# Patient Record
Sex: Female | Born: 1955
Health system: Southern US, Community
[De-identification: ages and names within clinical notes are randomized; demographics above are authoritative.]

## PROBLEM LIST (undated history)

## (undated) DIAGNOSIS — R011 Cardiac murmur, unspecified: Secondary | ICD-10-CM

## (undated) DIAGNOSIS — H35032 Hypertensive retinopathy, left eye: Secondary | ICD-10-CM

## (undated) DIAGNOSIS — E039 Hypothyroidism, unspecified: Secondary | ICD-10-CM

## (undated) DIAGNOSIS — R809 Proteinuria, unspecified: Secondary | ICD-10-CM

## (undated) DIAGNOSIS — F5104 Psychophysiologic insomnia: Secondary | ICD-10-CM

## (undated) DIAGNOSIS — M5417 Radiculopathy, lumbosacral region: Secondary | ICD-10-CM

## (undated) DIAGNOSIS — E785 Hyperlipidemia, unspecified: Secondary | ICD-10-CM

## (undated) DIAGNOSIS — C801 Malignant (primary) neoplasm, unspecified: Secondary | ICD-10-CM

## (undated) DIAGNOSIS — Z780 Asymptomatic menopausal state: Secondary | ICD-10-CM

## (undated) DIAGNOSIS — K219 Gastro-esophageal reflux disease without esophagitis: Secondary | ICD-10-CM

## (undated) DIAGNOSIS — Z923 Personal history of irradiation: Secondary | ICD-10-CM

## (undated) DIAGNOSIS — Z8489 Family history of other specified conditions: Secondary | ICD-10-CM

## (undated) DIAGNOSIS — I10 Essential (primary) hypertension: Secondary | ICD-10-CM

## (undated) DIAGNOSIS — E119 Type 2 diabetes mellitus without complications: Secondary | ICD-10-CM

## (undated) DIAGNOSIS — E049 Nontoxic goiter, unspecified: Secondary | ICD-10-CM

## (undated) DIAGNOSIS — N342 Other urethritis: Secondary | ICD-10-CM

## (undated) DIAGNOSIS — G4733 Obstructive sleep apnea (adult) (pediatric): Secondary | ICD-10-CM

## (undated) DIAGNOSIS — E559 Vitamin D deficiency, unspecified: Secondary | ICD-10-CM

## (undated) DIAGNOSIS — N3946 Mixed incontinence: Secondary | ICD-10-CM

## (undated) DIAGNOSIS — T7840XA Allergy, unspecified, initial encounter: Secondary | ICD-10-CM

## (undated) DIAGNOSIS — M19011 Primary osteoarthritis, right shoulder: Secondary | ICD-10-CM

## (undated) DIAGNOSIS — E669 Obesity, unspecified: Secondary | ICD-10-CM

## (undated) DIAGNOSIS — D649 Anemia, unspecified: Secondary | ICD-10-CM

## (undated) DIAGNOSIS — R252 Cramp and spasm: Secondary | ICD-10-CM

## (undated) HISTORY — DX: Primary osteoarthritis, right shoulder: M19.011

## (undated) HISTORY — DX: Essential (primary) hypertension: I10

## (undated) HISTORY — PX: BREAST BIOPSY: SHX20

## (undated) HISTORY — DX: Allergy, unspecified, initial encounter: T78.40XA

## (undated) HISTORY — DX: Mixed incontinence: N39.46

## (undated) HISTORY — DX: Proteinuria, unspecified: R80.9

## (undated) HISTORY — DX: Hypothyroidism, unspecified: E03.9

## (undated) HISTORY — DX: Vitamin D deficiency, unspecified: E55.9

## (undated) HISTORY — DX: Type 2 diabetes mellitus without complications: E11.9

## (undated) HISTORY — DX: Hyperlipidemia, unspecified: E78.5

## (undated) HISTORY — DX: Nontoxic goiter, unspecified: E04.9

## (undated) HISTORY — DX: Asymptomatic menopausal state: Z78.0

## (undated) HISTORY — DX: Psychophysiologic insomnia: F51.04

## (undated) HISTORY — DX: Obesity, unspecified: E66.9

## (undated) HISTORY — PX: FOOT SURGERY: SHX648

## (undated) HISTORY — DX: Other urethritis: N34.2

## (undated) HISTORY — DX: Anemia, unspecified: D64.9

## (undated) HISTORY — DX: Radiculopathy, lumbosacral region: M54.17

## (undated) HISTORY — DX: Obstructive sleep apnea (adult) (pediatric): G47.33

## (undated) HISTORY — PX: JOINT REPLACEMENT: SHX530

## (undated) HISTORY — DX: Cramp and spasm: R25.2

## (undated) HISTORY — DX: Hypertensive retinopathy, left eye: H35.032

---

## 2005-06-26 ENCOUNTER — Ambulatory Visit: Payer: Self-pay

## 2006-07-02 ENCOUNTER — Ambulatory Visit: Payer: Self-pay | Admitting: Family Medicine

## 2006-09-23 ENCOUNTER — Ambulatory Visit: Payer: Self-pay | Admitting: Internal Medicine

## 2007-07-04 ENCOUNTER — Ambulatory Visit: Payer: Self-pay | Admitting: Family Medicine

## 2008-01-19 ENCOUNTER — Ambulatory Visit: Payer: Self-pay | Admitting: Family Medicine

## 2008-07-20 ENCOUNTER — Ambulatory Visit: Payer: Self-pay | Admitting: Family Medicine

## 2009-01-10 LAB — HM COLONOSCOPY: HM COLON: NORMAL

## 2009-01-27 ENCOUNTER — Ambulatory Visit: Payer: Self-pay | Admitting: Gastroenterology

## 2009-06-24 ENCOUNTER — Ambulatory Visit: Payer: Self-pay | Admitting: Family Medicine

## 2009-07-26 ENCOUNTER — Ambulatory Visit: Payer: Self-pay | Admitting: Family Medicine

## 2010-02-06 ENCOUNTER — Ambulatory Visit: Payer: Self-pay | Admitting: Internal Medicine

## 2010-02-15 ENCOUNTER — Ambulatory Visit: Payer: Self-pay | Admitting: Otolaryngology

## 2010-03-10 ENCOUNTER — Ambulatory Visit: Payer: Self-pay | Admitting: Otolaryngology

## 2010-07-27 ENCOUNTER — Ambulatory Visit: Payer: Self-pay | Admitting: Family Medicine

## 2010-08-03 ENCOUNTER — Ambulatory Visit: Payer: Self-pay | Admitting: Family Medicine

## 2011-07-02 ENCOUNTER — Ambulatory Visit: Payer: Self-pay | Admitting: Internal Medicine

## 2011-07-12 ENCOUNTER — Other Ambulatory Visit: Payer: Self-pay | Admitting: Cardiology

## 2011-07-12 DIAGNOSIS — R011 Cardiac murmur, unspecified: Secondary | ICD-10-CM

## 2011-07-17 ENCOUNTER — Other Ambulatory Visit (INDEPENDENT_AMBULATORY_CARE_PROVIDER_SITE_OTHER): Payer: BC Managed Care – PPO | Admitting: *Deleted

## 2011-07-17 DIAGNOSIS — R011 Cardiac murmur, unspecified: Secondary | ICD-10-CM

## 2011-07-17 DIAGNOSIS — I359 Nonrheumatic aortic valve disorder, unspecified: Secondary | ICD-10-CM

## 2011-09-24 ENCOUNTER — Ambulatory Visit: Payer: Self-pay | Admitting: Family Medicine

## 2012-02-05 ENCOUNTER — Ambulatory Visit: Payer: Self-pay | Admitting: Family Medicine

## 2012-09-25 ENCOUNTER — Ambulatory Visit: Payer: Self-pay | Admitting: Family Medicine

## 2013-09-29 ENCOUNTER — Ambulatory Visit: Payer: Self-pay | Admitting: Family Medicine

## 2014-05-12 ENCOUNTER — Encounter: Payer: Self-pay | Admitting: Family Medicine

## 2014-06-04 ENCOUNTER — Ambulatory Visit: Payer: Self-pay | Admitting: Specialist

## 2014-06-12 ENCOUNTER — Encounter: Payer: Self-pay | Admitting: Family Medicine

## 2014-09-17 LAB — LIPID PANEL
CHOLESTEROL: 132 mg/dL (ref 0–200)
HDL: 46 mg/dL (ref 35–70)
LDL Cholesterol: 76 mg/dL
TRIGLYCERIDES: 51 mg/dL (ref 40–160)

## 2014-09-17 LAB — HM PAP SMEAR: HM PAP: NORMAL

## 2014-10-01 ENCOUNTER — Ambulatory Visit: Payer: Self-pay | Admitting: Family Medicine

## 2014-10-01 LAB — HM MAMMOGRAPHY: HM Mammogram: NORMAL

## 2014-12-13 DIAGNOSIS — M19019 Primary osteoarthritis, unspecified shoulder: Secondary | ICD-10-CM | POA: Insufficient documentation

## 2015-01-17 ENCOUNTER — Ambulatory Visit: Payer: Self-pay | Admitting: Surgery

## 2015-01-25 ENCOUNTER — Ambulatory Visit: Payer: Self-pay | Admitting: Surgery

## 2015-01-25 HISTORY — PX: TOTAL SHOULDER REPLACEMENT: SUR1217

## 2015-02-25 LAB — HEMOGLOBIN A1C: Hgb A1c MFr Bld: 6.2 % — AB (ref 4.0–6.0)

## 2015-03-07 LAB — SURGICAL PATHOLOGY

## 2015-03-11 DIAGNOSIS — Z96611 Presence of right artificial shoulder joint: Secondary | ICD-10-CM | POA: Insufficient documentation

## 2015-03-13 NOTE — Op Note (Signed)
PATIENT NAME:  Andrea Randolph, Andrea Randolph MR#:  [id number] DATE OF BIRTH:  [birth year]  DATE OF PROCEDURE:  01/25/2015  PREOPERATIVE DIAGNOSIS: Degenerative joint disease, right shoulder.   POSTOPERATIVE DIAGNOSIS:  Degenerative joint disease, right shoulder  PROCEDURE: Right total shoulder arthroplasty using a Biomet Comprehensive system with a press-fit #9 mini-humeral stem, a 48 x 21 mm humeral head, and a medium hybrid glenoid with a porous titanium post.   SURGEON:  Pascal Lux, M.D.   ASSISTANT:  Rachelle Hora, [facility]  ANESTHESIA: General endotracheal with an interscalene block placed preoperative by the anesthesiologist.   FINDINGS: As noted above.   COMPLICATIONS: None.   ESTIMATED BLOOD LOSS: 100 mL.  TOTAL FLUIDS:  Crystalloid 1000 mL of crystalloid.   URINE OUTPUT: Approximately 250 mL.   TOURNIQUET:   None.   DRAINS: None.   CLOSURE:  Staples.   BRIEF CLINICAL NOTE: The patient is a 59 year old female with a several year history of gradually worsening right shoulder pain. Her symptoms have progressed despite medications, activity modification, et Ronney Asters.  Her history and examination are consistent with a significant degenerative joint disease of the glenohumeral joint, confirmed by plain radiographs. She presents at this time for a right total shoulder arthroplasty.   PROCEDURE: The patient underwent placement of an interscalene block in the preoperative holding area. She was then brought into the Operating Room and lain in the supine position. After adequate general endotracheal intubation and anesthesia were obtained, the patient was repositioned in the beach chair position using the beach chair positioner. The right shoulder and upper extremity were prepped with ChloraPrep solution before being draped sterilely. Preoperative antibiotics were administered. A standard anterior approach to the shoulder was made with an approximately 4.5 to 5 inch incision. The incision was carried down  through the subcutaneous tissues to expose the deltopectoral fascia. The interval was developed and the cephalic vein identified and retracted laterally with the deltoid muscle.  The conjoined tendon was identified and retracted medially to expose the subscapularis tendon. The "three sisters" were identified and cauterized before the subscapularis tendon was taken down 1 cm medial to its attachment on the lesser tuberosity. Several tagging sutures were placed in the tendon before the joint was opened. A large loose body was encountered and removed. Capsular releases were carried out superiorly and inferiorly to better mobilize the proximal humerus. The humerus was externally rotated approximately 30 degrees and, using the external guide, the appropriate proximal humeral cut made with 30 degrees of retroversion. Several additional spurs were removed inferiorly.  Attention was directed to the glenoid side. The labrum was debrided circumferentially before a drill bit was placed in the center of the glenoid. This was used for guiding the glenoid reamer and then the central peg reamer once the size was assessed and found to be a medium. The glenoid reaming was performed using the appropriate medium-sized reamer.  Care was taken to get circumferential reaming before the peg was created using the appropriate central reamer. The 3 peg holes were then drilled into place. The surface was prepared for cementing by irrigating it thoroughly with sterile saline solution via the jet lavage system that before packing it with a Neo-Synephrine soaked piece of vag-pack. Meanwhile, cement was mixed on the back table. When the cement was ready, the three peg holes were filled with a small amount of cement each before some cement was applied to the back of the polyethylene portion of the glenoid. The glenoid was impacted into place with  care. The excess cement was removed using a freer elevator. Steady pressure was held on the glenoid  until the cement had hardened.   Attention redirected to the humeral side. The humerus was prepared by reaming the proximal humerus sequentially with tapered reamers beginning with a #5 tapered reamer and progressing to a #9 tapered reamer. This provided excellent circumferential chatter. The canal was then broached with the #7 broach progressed to a #9 broach.  This also demonstrated good fit. A trial reduction was performed using the  #9 broach with the 48 x 21 mm humeral head. This was selected based on the size of the humeral head that had been removed earlier. This demonstrated excellent stability to anterior and posterior displacement, as well as when the hand was placed to the top of the head, to the opposite shoulder, and in the apprehension position. There did not appear to be any unusual evidence of instability. The trial components were removed. The permanent #9 stem was impacted into place before the permanent 48 x 21 mm head was put together with a standard adapter and impacted into place. The Morse taper locking mechanism was verified using manual distraction. The head was relocated and the shoulder, again, placed through a range of motion with the findings as described above.   The wound was copiously irrigated with sterile saline solution via the jet lavage system before a  total of 20 mL of Exparel diluted out to 60 mL with normal saline and 30 mL of 0.25% Sensorcaine with epinephrine was injected in and around the pericapsular tissues and in the peri- incisional tissues to help with postoperative analgesia. The subscapularis tendon was reapproximated to the stump of tendon remaining on the lesser tuberosity using four #2 fiber wires. The deltopectoral interval was reapproximated using 2-0 Vicryl interrupted sutures before the subcutaneous tissues were closed in two layers using 2-0 Vicryl interrupted sutures. The skin was closed using staples. A sterile occlusive dressing was applied to the  shoulder before the arm was placed into a shoulder immobilizer. The patient then underwent placement of a Foley catheter before she was awakened, extubated, and returned to the recovery room in satisfactory condition after tolerating the procedure well.    ____________________________ J. Dorien Chihuahua, MD jjp:at D: 01/25/2015 14:39:12 ET T: 01/25/2015 17:16:41 ET JOB#: [id number]  cc: Pascal Lux, MD, <Dictator> Pascal Lux MD ELECTRONICALLY SIGNED 01/27/2015 9:18

## 2015-05-24 ENCOUNTER — Encounter: Payer: Self-pay | Admitting: Family Medicine

## 2015-05-24 DIAGNOSIS — I10 Essential (primary) hypertension: Secondary | ICD-10-CM | POA: Insufficient documentation

## 2015-05-24 DIAGNOSIS — E785 Hyperlipidemia, unspecified: Secondary | ICD-10-CM | POA: Insufficient documentation

## 2015-05-24 DIAGNOSIS — N3946 Mixed incontinence: Secondary | ICD-10-CM | POA: Insufficient documentation

## 2015-05-24 DIAGNOSIS — Z780 Asymptomatic menopausal state: Secondary | ICD-10-CM | POA: Insufficient documentation

## 2015-05-24 DIAGNOSIS — M5417 Radiculopathy, lumbosacral region: Secondary | ICD-10-CM | POA: Insufficient documentation

## 2015-05-24 DIAGNOSIS — H35039 Hypertensive retinopathy, unspecified eye: Secondary | ICD-10-CM | POA: Insufficient documentation

## 2015-05-24 DIAGNOSIS — D509 Iron deficiency anemia, unspecified: Secondary | ICD-10-CM | POA: Insufficient documentation

## 2015-05-24 DIAGNOSIS — G4700 Insomnia, unspecified: Secondary | ICD-10-CM | POA: Insufficient documentation

## 2015-05-24 DIAGNOSIS — K5909 Other constipation: Secondary | ICD-10-CM | POA: Insufficient documentation

## 2015-05-24 DIAGNOSIS — E890 Postprocedural hypothyroidism: Secondary | ICD-10-CM | POA: Insufficient documentation

## 2015-05-24 DIAGNOSIS — E559 Vitamin D deficiency, unspecified: Secondary | ICD-10-CM | POA: Insufficient documentation

## 2015-05-24 DIAGNOSIS — E049 Nontoxic goiter, unspecified: Secondary | ICD-10-CM | POA: Insufficient documentation

## 2015-05-24 DIAGNOSIS — R809 Proteinuria, unspecified: Secondary | ICD-10-CM | POA: Insufficient documentation

## 2015-05-24 DIAGNOSIS — E1129 Type 2 diabetes mellitus with other diabetic kidney complication: Secondary | ICD-10-CM | POA: Insufficient documentation

## 2015-05-24 DIAGNOSIS — J302 Other seasonal allergic rhinitis: Secondary | ICD-10-CM | POA: Insufficient documentation

## 2015-05-24 DIAGNOSIS — G4733 Obstructive sleep apnea (adult) (pediatric): Secondary | ICD-10-CM | POA: Insufficient documentation

## 2015-05-24 DIAGNOSIS — IMO0002 Reserved for concepts with insufficient information to code with codable children: Secondary | ICD-10-CM | POA: Insufficient documentation

## 2015-05-27 ENCOUNTER — Encounter: Payer: Self-pay | Admitting: Family Medicine

## 2015-05-27 ENCOUNTER — Ambulatory Visit (INDEPENDENT_AMBULATORY_CARE_PROVIDER_SITE_OTHER): Payer: BLUE CROSS/BLUE SHIELD | Admitting: Family Medicine

## 2015-05-27 VITALS — BP 132/82 | HR 80 | Resp 16 | Ht 65.0 in | Wt 230.1 lb

## 2015-05-27 DIAGNOSIS — E039 Hypothyroidism, unspecified: Secondary | ICD-10-CM | POA: Diagnosis not present

## 2015-05-27 DIAGNOSIS — H35039 Hypertensive retinopathy, unspecified eye: Secondary | ICD-10-CM | POA: Diagnosis not present

## 2015-05-27 DIAGNOSIS — E1121 Type 2 diabetes mellitus with diabetic nephropathy: Secondary | ICD-10-CM

## 2015-05-27 DIAGNOSIS — G4733 Obstructive sleep apnea (adult) (pediatric): Secondary | ICD-10-CM | POA: Diagnosis not present

## 2015-05-27 DIAGNOSIS — I10 Essential (primary) hypertension: Secondary | ICD-10-CM

## 2015-05-27 DIAGNOSIS — E785 Hyperlipidemia, unspecified: Secondary | ICD-10-CM

## 2015-05-27 DIAGNOSIS — L853 Xerosis cutis: Secondary | ICD-10-CM

## 2015-05-27 DIAGNOSIS — E668 Other obesity: Secondary | ICD-10-CM | POA: Diagnosis not present

## 2015-05-27 DIAGNOSIS — IMO0002 Reserved for concepts with insufficient information to code with codable children: Secondary | ICD-10-CM

## 2015-05-27 LAB — POCT UA - MICROALBUMIN: Microalbumin Ur, POC: 50 mg/L

## 2015-05-27 LAB — POCT GLYCOSYLATED HEMOGLOBIN (HGB A1C): Hemoglobin A1C: 5.9

## 2015-05-27 LAB — GLUCOSE, POCT (MANUAL RESULT ENTRY): POC GLUCOSE: 101 mg/dL — AB (ref 70–99)

## 2015-05-27 MED ORDER — OLMESARTAN-AMLODIPINE-HCTZ 40-10-25 MG PO TABS: 1.0000 | ORAL_TABLET | Freq: Every day | ORAL | Status: DC

## 2015-05-27 MED ORDER — PHENTERMINE-TOPIRAMATE ER 7.5-46 MG PO CP24: 1.0000 | ORAL_CAPSULE | Freq: Every day | ORAL | Status: DC

## 2015-05-27 MED ORDER — AMMONIUM LACTATE 12 % EX CREA: TOPICAL_CREAM | CUTANEOUS | Status: DC | PRN

## 2015-05-27 MED ORDER — LEVOTHYROXINE SODIUM 88 MCG PO TABS: 88.0000 ug | ORAL_TABLET | Freq: Every day | ORAL | Status: DC

## 2015-05-27 MED ORDER — DAPAGLIFLOZIN PRO-METFORMIN ER 10-1000 MG PO TB24: 1.0000 | ORAL_TABLET | Freq: Every day | ORAL | Status: DC

## 2015-05-27 MED ORDER — ROSUVASTATIN CALCIUM 10 MG PO TABS: 10.0000 mg | ORAL_TABLET | Freq: Every day | ORAL | Status: DC

## 2015-05-27 MED ORDER — PHENTERMINE-TOPIRAMATE ER 3.75-23 MG PO CP24: 1.0000 | ORAL_CAPSULE | Freq: Every day | ORAL | Status: DC

## 2015-05-27 NOTE — Patient Instructions (Signed)
Sleep Apnea  Sleep apnea is a sleep disorder characterized by abnormal pauses in breathing while you sleep. When your breathing pauses, the level of oxygen in your blood decreases. This causes you to move out of deep sleep and into light sleep. As a result, your quality of sleep is poor, and the system that carries your blood throughout your body (cardiovascular system) experiences stress. If sleep apnea remains untreated, the following conditions can develop:  High blood pressure (hypertension).  Coronary artery disease.  Inability to achieve or maintain an erection (impotence).  Impairment of your thought process (cognitive dysfunction). There are three types of sleep apnea: 1. Obstructive sleep apnea--Pauses in breathing during sleep because of a blocked airway. 2. Central sleep apnea--Pauses in breathing during sleep because the area of the brain that controls your breathing does not send the correct signals to the muscles that control breathing. 3. Mixed sleep apnea--A combination of both obstructive and central sleep apnea. RISK FACTORS The following risk factors can increase your risk of developing sleep apnea:  Being overweight.  Smoking.  Having narrow passages in your nose and throat.  Being of older age.  Being female.  Alcohol use.  Sedative and tranquilizer use.  Ethnicity. Among individuals younger than 35 years, African Americans are at increased risk of sleep apnea. SYMPTOMS   Difficulty staying asleep.  Daytime sleepiness and fatigue.  Loss of energy.  Irritability.  Loud, heavy snoring.  Morning headaches.  Trouble concentrating.  Forgetfulness.  Decreased interest in sex. DIAGNOSIS  In order to diagnose sleep apnea, your caregiver will perform a physical examination. Your caregiver may suggest that you take a home sleep test. Your caregiver may also recommend that you spend the night in a sleep lab. In the sleep lab, several monitors record  information about your heart, lungs, and brain while you sleep. Your leg and arm movements and blood oxygen level are also recorded. TREATMENT The following actions may help to resolve mild sleep apnea:  Sleeping on your side.   Using a decongestant if you have nasal congestion.   Avoiding the use of depressants, including alcohol, sedatives, and narcotics.   Losing weight and modifying your diet if you are overweight. There also are devices and treatments to help open your airway:  Oral appliances. These are custom-made mouthpieces that shift your lower jaw forward and slightly open your bite. This opens your airway.  Devices that create positive airway pressure. This positive pressure "splints" your airway open to help you breathe better during sleep. The following devices create positive airway pressure:  Continuous positive airway pressure (CPAP) device. The CPAP device creates a continuous level of air pressure with an air pump. The air is delivered to your airway through a mask while you sleep. This continuous pressure keeps your airway open.  Nasal expiratory positive airway pressure (EPAP) device. The EPAP device creates positive air pressure as you exhale. The device consists of single-use valves, which are inserted into each nostril and held in place by adhesive. The valves create very little resistance when you inhale but create much more resistance when you exhale. That increased resistance creates the positive airway pressure. This positive pressure while you exhale keeps your airway open, making it easier to breath when you inhale again.  Bilevel positive airway pressure (BPAP) device. The BPAP device is used mainly in patients with central sleep apnea. This device is similar to the CPAP device because it also uses an air pump to deliver continuous air pressure   through a mask. However, with the BPAP machine, the pressure is set at two different levels. The pressure when you  exhale is lower than the pressure when you inhale.  Surgery. Typically, surgery is only done if you cannot comply with less invasive treatments or if the less invasive treatments do not improve your condition. Surgery involves removing excess tissue in your airway to create a wider passage way. Document Released: 10/19/2002 Document Revised: 02/23/2013 Document Reviewed: 03/06/2012 [employer] Patient Information 2015 [employer]. This information is not intended to replace advice given to you by your health care provider. Make sure you discuss any questions you have with your health care provider.  

## 2015-05-27 NOTE — Progress Notes (Signed)
Name: Andrea Randolph   MRN: [id number]    DOB: 11/21/1955   Date:05/27/2015       Progress Note  Subjective  Chief Complaint  Chief Complaint  Patient presents with  . Medication Refill    3 month F/U  . Diabetes  . Hypertension  . Hyperlipidemia  . Sleep Apnea    HPI  DM II: she is taking medication as directed, denies side effects, hgbA1C is down to 5.9%, urine micro is 20, she denies polyphagia, polydipsia or polyuria.  She is not checking fsbs at home.  She is taking a daily aspirin daily   HTN: she has been compliant with medication and denies side effects of medication  Hyperlipidemia: she was taking Crestor 20mg  but stopped in March when she had shoulder replacement surgery because she was afraid of muscle aches.    OSA: she has not been compliant with CPAP machine, she used while in the hospital but stopped using when she got home after shoulder surgery.  Obesity: she states she has been walking one mile during week days and one hour on Saturdays. She states she does not have a high caloric diet, but has been unable to lose weight. She started to gain weight at age 38 and has been unable to lose since.   Patient Active Problem List   Diagnosis Date Noted  . Allergic rhinitis, seasonal 05/24/2015  . Benign essential HTN 05/24/2015  . Undiagnosed cardiac murmurs 05/24/2015  . Chronic constipation 05/24/2015  . Insomnia, persistent 05/24/2015  . Diabetes mellitus with renal manifestation 05/24/2015  . Dyslipidemia 05/24/2015  . Goiter 05/24/2015  . Acquired hypothyroidism 05/24/2015  . Mixed incontinence 05/24/2015  . Microalbuminuria 05/24/2015  . Iron deficiency anemia 05/24/2015  . Menopause 05/24/2015  . Obstructive apnea 05/24/2015  . Adult BMI 30+ 05/24/2015  . Hypertensive retinopathy 05/24/2015  . Vitamin D deficiency 05/24/2015  . H/O shoulder surgery 03/11/2015  . Arthritis of shoulder region, degenerative 12/13/2014    Past Surgical History  Procedure  Laterality Date  . Foot surgery    . Total shoulder replacement Right 01/25/2015    Dr. Roland Rack    Family History  Problem Relation Age of Onset  . Heart disease Mother   . Heart disease Father   . Kidney disease Sister   . Hypertension Sister   . Diabetes Sister   . Hypertension Brother   . CVA Brother     History   Social History  . Marital Status: Married    Spouse Name: N/A  . Number of Children: N/A  . Years of Education: N/A   Occupational History  . Not on file.   Social History Main Topics  . Smoking status: Never Smoker   . Smokeless tobacco: Never Used  . Alcohol Use: No  . Drug Use: No  . Sexual Activity: Not Currently   Other Topics Concern  . Not on file   Social History Narrative  . No narrative on file     Current outpatient prescriptions:  .  aspirin 81 MG tablet, Take by mouth., Disp: , Rfl:  .  glucose blood (FREESTYLE LITE) test strip, FREESTYLE LITE TEST (In Vitro Strip)  use as directed for 0 days  Quantity: 200.00;  Refills: 3   Ordered :25-Nov-2014  Vonna Kotyk ;  Started 30-Dec-2008 Active Comments: DX: 250.00, Disp: , Rfl:  .  ammonium lactate (LAC-HYDRIN) 12 % cream, Apply topically as needed for dry skin., Disp: 385 g, Rfl: 0 .  Dapagliflozin-Metformin HCl ER (XIGDUO XR) 08-999 MG TB24, Take 1 tablet by mouth daily., Disp: 30 tablet, Rfl: 2 .  levothyroxine (SYNTHROID, LEVOTHROID) 88 MCG tablet, Take 1 tablet (88 mcg total) by mouth daily., Disp: 30 tablet, Rfl: 0 .  Olmesartan-Amlodipine-HCTZ (TRIBENZOR) 40-10-25 MG TABS, Take 1 tablet by mouth daily., Disp: 30 tablet, Rfl: 2 .  Phentermine-Topiramate (QSYMIA) 3.75-23 MG CP24, Take 1 tablet by mouth daily., Disp: 14 capsule, Rfl: 0 .  Phentermine-Topiramate (QSYMIA) 7.5-46 MG CP24, Take 1 tablet by mouth daily., Disp: 30 capsule, Rfl: 0 .  rosuvastatin (CRESTOR) 10 MG tablet, Take 1 tablet (10 mg total) by mouth daily., Disp: 30 tablet, Rfl: 2  No Known  Allergies   ROS  Constitutional: Negative for fever or weight change.  Respiratory: Negative for cough and shortness of breath.   Cardiovascular: Negative for chest pain or palpitations.  Gastrointestinal: Negative for abdominal pain, no bowel changes.  Musculoskeletal: Negative for gait problem or joint swelling.  Skin: positive  for rash.  Neurological: Negative for dizziness or headache.  No other specific complaints in a complete review of systems (except as listed in HPI above).  Objective  Filed Vitals:   05/27/15 1004  BP: 132/82  Pulse: 80  Resp: 16  Height: 5\' 5"  (1.651 m)  Weight: 230 lb 2 oz (104.384 kg)  SpO2: 96%    Body mass index is 38.29 kg/(m^2).  Physical Exam Constitutional: Patient appears well-developed and obese. No distress.  HENT: Head: Normocephalic and atraumatic. Ears: B TMs ok, no erythema or effusion; Nose: Nose normal. Mouth/Throat: Oropharynx is clear and moist. No oropharyngeal exudate.  Eyes: Conjunctivae and EOM are normal. Pupils are equal, round, and reactive to light. No scleral icterus.  Neck: Normal range of motion. Neck supple. No JVD present. Large thyroid nodule centrally Cardiovascular: Normal rate, regular rhythm and  heart sounds showed a systolic ejection murmur on 2nd intercostal spaces. Trace  BLE edema. Pulmonary/Chest: Effort normal and breath sounds normal. No respiratory distress. Abdominal: Soft. Bowel sounds are normal, no distension. There is no tenderness. no masses Musculoskeletal: decrease  range of motion right shoulder no joint effusions. No gross deformities Neurological: he is alert and oriented to person, place, and time. No cranial nerve deficit. Coordination, balance, strength, speech and gait are normal.  Skin: Skin is warm and dry. Rash - dry pilling on right foot noted. No erythema.  Psychiatric: Patient has a normal mood and affect. behavior is normal. Judgment and thought content normal.  Recent Results  (from the past 2160 hour(s))  POCT UA - Microalbumin     Status: Abnormal   Collection Time: 05/27/15 10:03 AM  Result Value Ref Range   Microalbumin Ur, POC 50 mg/L   Creatinine, POC  mg/dL   Albumin/Creatinine Ratio, Urine, POC    POCT HgB A1C     Status: None   Collection Time: 05/27/15 10:04 AM  Result Value Ref Range   Hemoglobin A1C 5.9   POCT Glucose (CBG)     Status: Abnormal   Collection Time: 05/27/15 10:15 AM  Result Value Ref Range   POC Glucose 101 (A) 70 - 99 mg/dl    Diabetic Foot Exam: Diabetic Foot Exam - Simple   Simple Foot Form  Visual Inspection  See comments:  Yes  Sensation Testing  Intact to touch and monofilament testing bilaterally:  Yes  Pulse Check  Posterior Tibialis and Dorsalis pulse intact bilaterally:  Yes  Comments  Dry skin on right foot  PHQ2/9: Depression screen PHQ 2/9 05/27/2015  Decreased Interest 0  Down, Depressed, Hopeless 0  PHQ - 2 Score 0    Fall Risk: Fall Risk  05/27/2015  Falls in the past year? No    Assessment & Plan  1. Diabetic nephropathy associated with type 2 diabetes mellitus At goal, stop Janumet, change to Xigduo since hgbA1C is down to 5.9%  - POCT HgB A1C - POCT UA - Microalbumin - POCT Glucose (CBG) - Dapagliflozin-Metformin HCl ER (XIGDUO XR) 08-999 MG TB24; Take 1 tablet by mouth daily.  Dispense: 30 tablet; Refill: 2  2. Dyslipidemia Resume Crestor at lower dose, she does not like taking the 20 mg tablet - rosuvastatin (CRESTOR) 10 MG tablet; Take 1 tablet (10 mg total) by mouth daily.  Dispense: 30 tablet; Refill: 2  3. Acquired hypothyroidism Continue follow up with Dr. Gabriel Carina   4. Benign essential HTN At goal, continue medication - Olmesartan-Amlodipine-HCTZ (TRIBENZOR) 40-10-25 MG TABS; Take 1 tablet by mouth daily.  Dispense: 30 tablet; Refill: 2  5. Adult BMI 30+ Discussed risk and benefits of medications and she would like to try Qsymia - Phentermine-Topiramate (QSYMIA) 3.75-23  MG CP24; Take 1 tablet by mouth daily.  Dispense: 14 capsule; Refill: 0 - Phentermine-Topiramate (QSYMIA) 7.5-46 MG CP24; Take 1 tablet by mouth daily.  Dispense: 30 capsule; Refill: 0  6. Obstructive apnea Needs to resume CPAP machine, to decrease risk of heart attacks and strokes  7. Hypertensive retinopathy, unspecified laterality Continue follow up with ophthalmologist   8. Dry skin Right foot - ammonium lactate (LAC-HYDRIN) 12 % cream; Apply topically as needed for dry skin.  Dispense: 385 g; Refill: 0

## 2015-06-20 ENCOUNTER — Other Ambulatory Visit: Payer: Self-pay | Admitting: Family Medicine

## 2015-08-29 ENCOUNTER — Encounter: Payer: Self-pay | Admitting: Family Medicine

## 2015-08-29 ENCOUNTER — Ambulatory Visit (INDEPENDENT_AMBULATORY_CARE_PROVIDER_SITE_OTHER): Payer: BLUE CROSS/BLUE SHIELD | Admitting: Family Medicine

## 2015-08-29 VITALS — BP 116/66 | HR 75 | Temp 98.3°F | Resp 18 | Ht 64.0 in | Wt 224.1 lb

## 2015-08-29 DIAGNOSIS — E668 Other obesity: Secondary | ICD-10-CM

## 2015-08-29 DIAGNOSIS — I10 Essential (primary) hypertension: Secondary | ICD-10-CM | POA: Diagnosis not present

## 2015-08-29 DIAGNOSIS — E1121 Type 2 diabetes mellitus with diabetic nephropathy: Secondary | ICD-10-CM

## 2015-08-29 DIAGNOSIS — D509 Iron deficiency anemia, unspecified: Secondary | ICD-10-CM

## 2015-08-29 DIAGNOSIS — G4700 Insomnia, unspecified: Secondary | ICD-10-CM

## 2015-08-29 DIAGNOSIS — E785 Hyperlipidemia, unspecified: Secondary | ICD-10-CM | POA: Diagnosis not present

## 2015-08-29 DIAGNOSIS — R809 Proteinuria, unspecified: Secondary | ICD-10-CM | POA: Diagnosis not present

## 2015-08-29 DIAGNOSIS — E559 Vitamin D deficiency, unspecified: Secondary | ICD-10-CM | POA: Diagnosis not present

## 2015-08-29 DIAGNOSIS — Z23 Encounter for immunization: Secondary | ICD-10-CM | POA: Diagnosis not present

## 2015-08-29 DIAGNOSIS — E039 Hypothyroidism, unspecified: Secondary | ICD-10-CM | POA: Diagnosis not present

## 2015-08-29 DIAGNOSIS — IMO0002 Reserved for concepts with insufficient information to code with codable children: Secondary | ICD-10-CM

## 2015-08-29 DIAGNOSIS — G4733 Obstructive sleep apnea (adult) (pediatric): Secondary | ICD-10-CM

## 2015-08-29 LAB — POCT GLYCOSYLATED HEMOGLOBIN (HGB A1C): HEMOGLOBIN A1C: 6.1

## 2015-08-29 MED ORDER — NALTREXONE-BUPROPION HCL ER 8-90 MG PO TB12: 2.0000 | ORAL_TABLET | Freq: Two times a day (BID) | ORAL | Status: DC

## 2015-08-29 MED ORDER — OLMESARTAN-AMLODIPINE-HCTZ 40-10-25 MG PO TABS: 1.0000 | ORAL_TABLET | Freq: Every day | ORAL | Status: DC

## 2015-08-29 MED ORDER — ROSUVASTATIN CALCIUM 5 MG PO TABS: 5.0000 mg | ORAL_TABLET | Freq: Every day | ORAL | Status: DC

## 2015-08-29 MED ORDER — DAPAGLIFLOZIN PRO-METFORMIN ER 10-1000 MG PO TB24
1.0000 | ORAL_TABLET | Freq: Every day | ORAL | Status: DC
Start: 2015-08-29 — End: 2015-12-21

## 2015-08-29 NOTE — Progress Notes (Signed)
Name: Andrea Randolph   MRN: [id number]    DOB: 1956/01/12   Date:08/29/2015       Progress Note  Subjective  Chief Complaint  Chief Complaint  Patient presents with  . Medication Refill    3 month F/U  . Diabetes  . Hypertension  . Hyperlipidemia    patient states not taking medication due to having muscle pain in legs and at last visit you decreased dosage, but still having pain.  Marland Kitchen Hypothyroidism    HPI    DM II: she is taking medication as directed, denies side effects, hgbA1C is still at goal at 6.1%, urine micro was 50 on her last visit,  she denies polyphagia, polydipsia or polyuria. She is not checking fsbs at home. She is taking a daily aspirin daily, she stopped taking Crestor because of myalgia. Eye exam is due soon.   HTN: she has been compliant with medication and denies side effects of medication. No symptoms orthostatic hypotension. No chest pain or palpitation  Hyperlipidemia: she was taking Crestor 20mg  but stopped in March when she had shoulder replacement surgery because she was afraid of muscle aches.We gave her 10 mg prescription in July but she still noticed muscles aches, willing to try a lower dose  OSA: she has not been compliant with CPAP machine, she states she looks at the mask every night but she is not placing it on her face.  Obesity: she states she has been walking one mile during week days and one hour on Saturdays. She has health coach at work, changed her diet, she started Qsymia in July and lost weight, but prescription is too expensive, she has lost 6 lbs since last visit, she would like to try something cheaper.   Patient Active Problem List   Diagnosis Date Noted  . Allergic rhinitis, seasonal 05/24/2015  . Benign essential HTN 05/24/2015  . Undiagnosed cardiac murmurs 05/24/2015  . Chronic constipation 05/24/2015  . Insomnia, persistent 05/24/2015  . Diabetes mellitus with renal manifestation ([place]) 05/24/2015  . Dyslipidemia 05/24/2015  .  Goiter 05/24/2015  . Acquired hypothyroidism 05/24/2015  . Mixed incontinence 05/24/2015  . Microalbuminuria 05/24/2015  . Iron deficiency anemia 05/24/2015  . Menopause 05/24/2015  . Obstructive apnea 05/24/2015  . Adult BMI 30+ 05/24/2015  . Hypertensive retinopathy 05/24/2015  . Vitamin D deficiency 05/24/2015  . History of shoulder surgery 03/11/2015    Past Surgical History  Procedure Laterality Date  . Foot surgery    . Total shoulder replacement Right 01/25/2015    Dr. Roland Rack    Family History  Problem Relation Age of Onset  . Heart disease Mother   . Heart disease Father   . Kidney disease Sister   . Hypertension Sister   . Diabetes Sister   . Hypertension Brother   . CVA Brother     Social History   Social History  . Marital Status: Married    Spouse Name: N/A  . Number of Children: N/A  . Years of Education: N/A   Occupational History  . Not on file.   Social History Main Topics  . Smoking status: Never Smoker   . Smokeless tobacco: Never Used  . Alcohol Use: No  . Drug Use: No  . Sexual Activity: Not Currently   Other Topics Concern  . Not on file   Social History Narrative     Current outpatient prescriptions:  Marland Kitchen  Multiple Vitamin (MULTIVITAMIN) tablet, Take 1 tablet by mouth daily., Disp: ,  Rfl:  .  ammonium lactate (LAC-HYDRIN) 12 % cream, Apply topically as needed for dry skin., Disp: 385 g, Rfl: 0 .  aspirin 81 MG tablet, Take by mouth., Disp: , Rfl:  .  Dapagliflozin-Metformin HCl ER (XIGDUO XR) 08-999 MG TB24, Take 1 tablet by mouth daily., Disp: 30 tablet, Rfl: 2 .  glucose blood (FREESTYLE LITE) test strip, FREESTYLE LITE TEST (In Vitro Strip)  use as directed for 0 days  Quantity: 200.00;  Refills: 3   Ordered :25-Nov-2014  Vonna Kotyk ;  Started 30-Dec-2008 Active Comments: DX: 250.00, Disp: , Rfl:  .  levothyroxine (SYNTHROID, LEVOTHROID) 88 MCG tablet, Take 1 tablet (88 mcg total) by mouth daily., Disp: 30 tablet, Rfl: 0 .   Olmesartan-Amlodipine-HCTZ (TRIBENZOR) 40-10-25 MG TABS, Take 1 tablet by mouth daily., Disp: 30 tablet, Rfl: 2 .  rosuvastatin (CRESTOR) 5 MG tablet, Take 1 tablet (5 mg total) by mouth daily., Disp: 30 tablet, Rfl: 2  No Known Allergies   ROS  Constitutional: Negative for fever, positive for weight change.  Respiratory: Negative for cough and shortness of breath.  Cardiovascular: Negative for chest pain or palpitations.  Gastrointestinal: Negative for abdominal pain, no bowel changes.  Musculoskeletal: Negative for gait problem or joint swelling.  Skin: positive for rash. Improved rash on her foot since started on lotion Neurological: Negative for dizziness or headache.  No other specific complaints in a complete review of systems (except as listed in HPI above).  Objective  Filed Vitals:   08/29/15 1037  BP: 116/66  Pulse: 75  Temp: 98.3 F (36.8 C)  TempSrc: Oral  Resp: 18  Height: 5\' 4"  (1.626 m)  Weight: 224 lb 1.6 oz (101.651 kg)  SpO2: 98%    Body mass index is 38.45 kg/(m^2).  Physical Exam  Constitutional: Patient appears well-developed and well-nourished. ObeseNo distress.  HEENT: head atraumatic, normocephalic, pupils equal and reactive to light,neck supple, throat within normal limits. Large goiter on right side Cardiovascular: Normal rate, regular rhythm and normal heart sounds.  1 plus systolic ejection  murmur heard. No BLE edema. Pulmonary/Chest: Effort normal and breath sounds normal. No respiratory distress. Abdominal: Soft.  There is no tenderness. Psychiatric: Patient has a normal mood and affect. behavior is normal. Judgment and thought content normal. Skin: some pilling, much better on right foot  Recent Results (from the past 2160 hour(s))  POCT HgB A1C     Status: None   Collection Time: 08/29/15 10:42 AM  Result Value Ref Range   Hemoglobin A1C 6.1     PHQ2/9: Depression screen [facility] 2/9 08/29/2015 05/27/2015  Decreased Interest 0 0   Down, Depressed, Hopeless 0 0  PHQ - 2 Score 0 0    Fall Risk: Fall Risk  08/29/2015 05/27/2015  Falls in the past year? No No    Functional Status Survey: Is the patient deaf or have difficulty hearing?: No Does the patient have difficulty seeing, even when wearing glasses/contacts?: Yes (glasses) Does the patient have difficulty concentrating, remembering, or making decisions?: No Does the patient have difficulty walking or climbing stairs?: No Does the patient have difficulty dressing or bathing?: No Does the patient have difficulty doing errands alone such as visiting a doctor's office or shopping?: No    Assessment & Plan  1. Diabetic nephropathy associated with type 2 diabetes mellitus (HCC)  Continue medication and life style modification - POCT HgB A1C - Dapagliflozin-Metformin HCl ER (XIGDUO XR) 08-999 MG TB24; Take 1 tablet by mouth daily.  Dispense: 30 tablet; Refill: 2  2. Needs flu shot  - Flu Vaccine QUAD 36+ mos PF IM (Fluarix & Fluzone Quad PF)  3. Insomnia, persistent  Doing well at this time, too tired to stay awake  4. Dyslipidemia  We will decrease dose, having myalgia at higher dose - Lipid panel - rosuvastatin (CRESTOR) 5 MG tablet; Take 1 tablet (5 mg total) by mouth daily.  Dispense: 30 tablet; Refill: 2  5. Acquired hypothyroidism   Continue follow up with Dr. Gabriel Carina   6. Obstructive apnea  Needs to resume CPAP machine to decrease risk of strokes and heart attacks  7. Benign essential HTN  - Olmesartan-Amlodipine-HCTZ (TRIBENZOR) 40-10-25 MG TABS; Take 1 tablet by mouth daily.  Dispense: 30 tablet; Refill: 2 - Comprehensive metabolic panel  8. Microalbuminuria   continue ARB  9. Vitamin D deficiency  - Vit D  25 hydroxy (rtn osteoporosis monitoring)  10. Iron deficiency anemia  - Ferritin - CBC with Differential/Platelet   11. Need for pneumococcal vaccination  - Pneumococcal conjugate vaccine 13-valent IM  12. Adult  BMI 30+  Discussed all optiosns for weight loss medications including Belviq, Qsymia, Saxenda and Contrave. Discussed risk and benefits of each of them. - Naltrexone-Bupropion HCl ER (CONTRAVE) 8-90 MG TB12; Take 2 tablets by mouth 2 (two) times daily.  Dispense: 120 tablet; Refill: 1

## 2015-08-30 LAB — CBC WITH DIFFERENTIAL/PLATELET
BASOS: 1 %
Basophils Absolute: 0 10*3/uL (ref 0.0–0.2)
EOS (ABSOLUTE): 0.1 10*3/uL (ref 0.0–0.4)
EOS: 2 %
HEMATOCRIT: 37.6 % (ref 34.0–46.6)
HEMOGLOBIN: 12.1 g/dL (ref 11.1–15.9)
Immature Grans (Abs): 0 10*3/uL (ref 0.0–0.1)
Immature Granulocytes: 0 %
LYMPHS ABS: 2.3 10*3/uL (ref 0.7–3.1)
Lymphs: 50 %
MCH: 26.5 pg — ABNORMAL LOW (ref 26.6–33.0)
MCHC: 32.2 g/dL (ref 31.5–35.7)
MCV: 83 fL (ref 79–97)
MONOCYTES: 5 %
MONOS ABS: 0.2 10*3/uL (ref 0.1–0.9)
NEUTROS ABS: 1.9 10*3/uL (ref 1.4–7.0)
Neutrophils: 42 %
Platelets: 404 10*3/uL — ABNORMAL HIGH (ref 150–379)
RBC: 4.56 x10E6/uL (ref 3.77–5.28)
RDW: 15.1 % (ref 12.3–15.4)
WBC: 4.5 10*3/uL (ref 3.4–10.8)

## 2015-08-30 LAB — LIPID PANEL
CHOL/HDL RATIO: 4.5 ratio — AB (ref 0.0–4.4)
Cholesterol, Total: 251 mg/dL — ABNORMAL HIGH (ref 100–199)
HDL: 56 mg/dL (ref >39)
LDL Calculated: 178 mg/dL — ABNORMAL HIGH (ref 0–99)
Triglycerides: 84 mg/dL (ref 0–149)
VLDL Cholesterol Cal: 17 mg/dL (ref 5–40)

## 2015-08-30 LAB — COMPREHENSIVE METABOLIC PANEL
A/G RATIO: 1.7 (ref 1.1–2.5)
ALT: 17 IU/L (ref 0–32)
AST: 18 IU/L (ref 0–40)
Albumin: 4.3 g/dL (ref 3.5–5.5)
Alkaline Phosphatase: 49 IU/L (ref 39–117)
BUN/Creatinine Ratio: 16 (ref 9–23)
BUN: 15 mg/dL (ref 6–24)
Bilirubin Total: 0.3 mg/dL (ref 0.0–1.2)
CALCIUM: 9.8 mg/dL (ref 8.7–10.2)
CO2: 26 mmol/L (ref 18–29)
CREATININE: 0.96 mg/dL (ref 0.57–1.00)
Chloride: 103 mmol/L (ref 97–106)
GFR calc Af Amer: 76 mL/min/{1.73_m2} (ref >59)
GFR, EST NON AFRICAN AMERICAN: 66 mL/min/{1.73_m2} (ref >59)
GLUCOSE: 88 mg/dL (ref 65–99)
Globulin, Total: 2.6 g/dL (ref 1.5–4.5)
POTASSIUM: 3.9 mmol/L (ref 3.5–5.2)
Sodium: 140 mmol/L (ref 136–144)
Total Protein: 6.9 g/dL (ref 6.0–8.5)

## 2015-08-30 LAB — FERRITIN: Ferritin: 27 ng/mL (ref 15–150)

## 2015-08-30 LAB — VITAMIN D 25 HYDROXY (VIT D DEFICIENCY, FRACTURES): Vit D, 25-Hydroxy: 36 ng/mL (ref 30.0–100.0)

## 2015-10-12 ENCOUNTER — Encounter: Payer: Self-pay | Admitting: Family Medicine

## 2015-10-12 ENCOUNTER — Ambulatory Visit (INDEPENDENT_AMBULATORY_CARE_PROVIDER_SITE_OTHER): Payer: BLUE CROSS/BLUE SHIELD | Admitting: Family Medicine

## 2015-10-12 VITALS — BP 140/78 | HR 88 | Temp 98.7°F | Resp 16 | Ht 65.0 in | Wt 221.7 lb

## 2015-10-12 DIAGNOSIS — E785 Hyperlipidemia, unspecified: Secondary | ICD-10-CM | POA: Diagnosis not present

## 2015-10-12 DIAGNOSIS — H6122 Impacted cerumen, left ear: Secondary | ICD-10-CM | POA: Diagnosis not present

## 2015-10-12 DIAGNOSIS — J069 Acute upper respiratory infection, unspecified: Secondary | ICD-10-CM

## 2015-10-12 MED ORDER — BENZONATATE 100 MG PO CAPS: 100.0000 mg | ORAL_CAPSULE | Freq: Three times a day (TID) | ORAL | Status: DC | PRN

## 2015-10-12 MED ORDER — CIPROFLOXACIN-HYDROCORTISONE 0.2-1 % OT SUSP: 3.0000 [drp] | Freq: Two times a day (BID) | OTIC | Status: DC

## 2015-10-12 NOTE — Progress Notes (Signed)
Name: Andrea Randolph   MRN: [id number]    DOB: 1956-01-24   Date:10/12/2015       Progress Note  Subjective  Chief Complaint  Chief Complaint  Patient presents with  . URI    onset almost 2 weeks, syptoms include: congestion, loss voice,sore throat,cough and patient states had ear bleed    HPI  URI: she has been sick for the past 2 weeks, initially had some fever, nasal congestion, sore throat, headache, and wheezing. Most symptoms have improved, but now has left ear pain, and some blood that drained from the left ear 5 days ago. Also is still hoarse and has a dry cough. Other symptoms have improved.  No hearing loss, but feels muffled from left ear at times.    Patient Active Problem List   Diagnosis Date Noted  . Allergic rhinitis, seasonal 05/24/2015  . Benign essential HTN 05/24/2015  . Undiagnosed cardiac murmurs 05/24/2015  . Chronic constipation 05/24/2015  . Diabetes mellitus with renal manifestation ([place]) 05/24/2015  . Dyslipidemia 05/24/2015  . Goiter 05/24/2015  . Acquired hypothyroidism 05/24/2015  . Mixed incontinence 05/24/2015  . Microalbuminuria 05/24/2015  . Iron deficiency anemia 05/24/2015  . Menopause 05/24/2015  . Obstructive apnea 05/24/2015  . Adult BMI 30+ 05/24/2015  . Hypertensive retinopathy 05/24/2015  . Vitamin D deficiency 05/24/2015  . History of shoulder surgery 03/11/2015    Past Surgical History  Procedure Laterality Date  . Foot surgery    . Total shoulder replacement Right 01/25/2015    Dr. Roland Rack    Family History  Problem Relation Age of Onset  . Heart disease Mother   . Heart disease Father   . Kidney disease Sister   . Hypertension Sister   . Diabetes Sister   . Hypertension Brother   . CVA Brother     Social History   Social History  . Marital Status: Married    Spouse Name: N/A  . Number of Children: N/A  . Years of Education: N/A   Occupational History  . Not on file.   Social History Main Topics  . Smoking  status: Never Smoker   . Smokeless tobacco: Never Used  . Alcohol Use: No  . Drug Use: No  . Sexual Activity: Not Currently   Other Topics Concern  . Not on file   Social History Narrative     Current outpatient prescriptions:  .  ammonium lactate (LAC-HYDRIN) 12 % cream, Apply topically as needed for dry skin., Disp: 385 g, Rfl: 0 .  aspirin 81 MG tablet, Take by mouth., Disp: , Rfl:  .  benzonatate (TESSALON) 100 MG capsule, Take 1-2 capsules (100-200 mg total) by mouth 3 (three) times daily as needed for cough., Disp: 40 capsule, Rfl: 0 .  Dapagliflozin-Metformin HCl ER (XIGDUO XR) 08-999 MG TB24, Take 1 tablet by mouth daily., Disp: 30 tablet, Rfl: 2 .  glucose blood (FREESTYLE LITE) test strip, FREESTYLE LITE TEST (In Vitro Strip)  use as directed for 0 days  Quantity: 200.00;  Refills: 3   Ordered :25-Nov-2014  Vonna Kotyk ;  Started 30-Dec-2008 Active Comments: DX: 250.00, Disp: , Rfl:  .  levothyroxine (SYNTHROID, LEVOTHROID) 88 MCG tablet, Take 1 tablet (88 mcg total) by mouth daily., Disp: 30 tablet, Rfl: 0 .  Multiple Vitamin (MULTIVITAMIN) tablet, Take 1 tablet by mouth daily., Disp: , Rfl:  .  Naltrexone-Bupropion HCl ER (CONTRAVE) 8-90 MG TB12, Take 2 tablets by mouth 2 (two) times daily., Disp: 120 tablet,  Rfl: 1 .  Olmesartan-Amlodipine-HCTZ (TRIBENZOR) 40-10-25 MG TABS, Take 1 tablet by mouth daily., Disp: 30 tablet, Rfl: 2 .  rosuvastatin (CRESTOR) 5 MG tablet, Take 1 tablet (5 mg total) by mouth daily., Disp: 30 tablet, Rfl: 2  No Known Allergies   ROS  Ten systems reviewed and is negative except as mentioned in HPI   Objective  Filed Vitals:   10/12/15 0809  BP: 140/78  Pulse: 88  Temp: 98.7 F (37.1 C)  TempSrc: Oral  Resp: 16  Height: 5\' 5"  (1.651 m)  Weight: 221 lb 11.2 oz (100.562 kg)  SpO2: 98%    Body mass index is 36.89 kg/(m^2).  Physical Exam Constitutional: Patient appears well-developed and well-nourished. Obese No distress.   HEENT: head atraumatic, normocephalic, pupils equal and reactive to light, ears right TM is normal, partially visualized left TM and intact, lots of cerumen on left ear canal , neck supple, throat within normal limits. Thyromegaly  Cardiovascular: Normal rate, regular rhythm and Systolic ejection murmur on 2nd right intercostal space.  No BLE edema. Pulmonary/Chest: Effort normal and breath sounds normal. No respiratory distress. Abdominal: Soft.  There is no tenderness. Psychiatric: Patient has a normal mood and affect. behavior is normal. Judgment and thought content normal.  Recent Results (from the past 2160 hour(s))  POCT HgB A1C     Status: None   Collection Time: 08/29/15 10:42 AM  Result Value Ref Range   Hemoglobin A1C 6.1   Comprehensive metabolic panel     Status: None   Collection Time: 08/29/15 11:35 AM  Result Value Ref Range   Glucose 88 65 - 99 mg/dL   BUN 15 6 - 24 mg/dL   Creatinine, Ser 0.96 0.57 - 1.00 mg/dL   GFR calc non Af Amer 66 >59 mL/min/1.73   GFR calc Af Amer 76 >59 mL/min/1.73   BUN/Creatinine Ratio 16 9 - 23   Sodium 140 136 - 144 mmol/L    Comment:               **Please note reference interval change**   Potassium 3.9 3.5 - 5.2 mmol/L    Comment:               **Please note reference interval change**   Chloride 103 97 - 106 mmol/L    Comment:               **Please note reference interval change**   CO2 26 18 - 29 mmol/L   Calcium 9.8 8.7 - 10.2 mg/dL   Total Protein 6.9 6.0 - 8.5 g/dL   Albumin 4.3 3.5 - 5.5 g/dL   Globulin, Total 2.6 1.5 - 4.5 g/dL   Albumin/Globulin Ratio 1.7 1.1 - 2.5   Bilirubin Total 0.3 0.0 - 1.2 mg/dL   Alkaline Phosphatase 49 39 - 117 IU/L   AST 18 0 - 40 IU/L   ALT 17 0 - 32 IU/L  Lipid panel     Status: Abnormal   Collection Time: 08/29/15 11:35 AM  Result Value Ref Range   Cholesterol, Total 251 (H) 100 - 199 mg/dL   Triglycerides 84 0 - 149 mg/dL   HDL 56 >39 mg/dL    Comment: According to ATP-III Guidelines,  HDL-C >59 mg/dL is considered a negative risk factor for CHD.    VLDL Cholesterol Cal 17 5 - 40 mg/dL   LDL Calculated 178 (H) 0 - 99 mg/dL   Chol/HDL Ratio 4.5 (H) 0.0 - 4.4 ratio units  Comment:                                   T. Chol/HDL Ratio                                             Men  Women                               1/2 Avg.Risk  3.4    3.3                                   Avg.Risk  5.0    4.4                                2X Avg.Risk  9.6    7.1                                3X Avg.Risk 23.4   11.0   Ferritin     Status: None   Collection Time: 08/29/15 11:35 AM  Result Value Ref Range   Ferritin 27 15 - 150 ng/mL  CBC with Differential/Platelet     Status: Abnormal   Collection Time: 08/29/15 11:35 AM  Result Value Ref Range   WBC 4.5 3.4 - 10.8 x10E3/uL   RBC 4.56 3.77 - 5.28 x10E6/uL   Hemoglobin 12.1 11.1 - 15.9 g/dL   Hematocrit 37.6 34.0 - 46.6 %   MCV 83 79 - 97 fL   MCH 26.5 (L) 26.6 - 33.0 pg   MCHC 32.2 31.5 - 35.7 g/dL   RDW 15.1 12.3 - 15.4 %   Platelets 404 (H) 150 - 379 x10E3/uL   Neutrophils 42 %   Lymphs 50 %   Monocytes 5 %   Eos 2 %   Basos 1 %   Neutrophils Absolute 1.9 1.4 - 7.0 x10E3/uL   Lymphocytes Absolute 2.3 0.7 - 3.1 x10E3/uL   Monocytes Absolute 0.2 0.1 - 0.9 x10E3/uL   EOS (ABSOLUTE) 0.1 0.0 - 0.4 x10E3/uL   Basophils Absolute 0.0 0.0 - 0.2 x10E3/uL   Immature Granulocytes 0 %   Immature Grans (Abs) 0.0 0.0 - 0.1 x10E3/uL  Vit D  25 hydroxy (rtn osteoporosis monitoring)     Status: None   Collection Time: 08/29/15 11:35 AM  Result Value Ref Range   Vit D, 25-Hydroxy 36.0 30.0 - 100.0 ng/mL    Comment: Vitamin D deficiency has been defined by the Institute of Medicine and an Endocrine Society practice guideline as a level of serum 25-OH vitamin D less than 20 ng/mL (1,2). The Endocrine Society went on to further define vitamin D insufficiency as a level between 21 and 29 ng/mL (2). 1. IOM (Institute of Medicine).  2010. Dietary reference    intakes for calcium and D. [place]: The    [employer]. 2. Holick MF, Binkley Berkley, Bischoff-Ferrari HA, et al.    Evaluation, treatment, and prevention of vitamin D    deficiency: an Endocrine Society clinical practice    guideline.  JCEM. 2011 Jul; 96(7):1911-30.      PHQ2/9: Depression screen [facility] 2/9 08/29/2015 05/27/2015  Decreased Interest 0 0  Down, Depressed, Hopeless 0 0  PHQ - 2 Score 0 0     Fall Risk: Fall Risk  08/29/2015 05/27/2015  Falls in the past year? No No    Assessment & Plan  1. Upper respiratory infection  Continue fluids and rest, - benzonatate (TESSALON) 100 MG capsule; Take 1-2 capsules (100-200 mg total) by mouth 3 (three) times daily as needed for cough.  Dispense: 40 capsule; Refill: 0   2. Cerumen impaction, left  - Ear Lavage Verbal consent given Possible side effects discussed with patient Ears were  lavaged with warm water and peroxide  Patient tolerated procedure well, she felt a little dizzy but recovered before she left the office No complications  - ciprofloxacin-hydrocortisone (CIPRO HC) otic suspension; Place 3 drops into the left ear 2 (two) times daily.  Dispense: 10 mL; Refill: 0 We gave prescription for Cipro HC because TM was very irritated, if symptoms do not improve we will refer to ENT  3. Dyslipidemia  She was not taking Crestor when labs were done, but has been taking it every other day since she got the lab results and is tolerating it well now

## 2015-11-16 ENCOUNTER — Ambulatory Visit (INDEPENDENT_AMBULATORY_CARE_PROVIDER_SITE_OTHER): Payer: BLUE CROSS/BLUE SHIELD | Admitting: Family Medicine

## 2015-11-16 ENCOUNTER — Encounter: Payer: Self-pay | Admitting: Family Medicine

## 2015-11-16 VITALS — BP 134/74 | HR 94 | Temp 98.2°F | Resp 16 | Ht 65.0 in | Wt 222.2 lb

## 2015-11-16 DIAGNOSIS — N950 Postmenopausal bleeding: Secondary | ICD-10-CM | POA: Diagnosis not present

## 2015-11-16 DIAGNOSIS — Z0000 Encounter for general adult medical examination without abnormal findings: Secondary | ICD-10-CM

## 2015-11-16 DIAGNOSIS — Z01419 Encounter for gynecological examination (general) (routine) without abnormal findings: Secondary | ICD-10-CM

## 2015-11-16 DIAGNOSIS — Z1239 Encounter for other screening for malignant neoplasm of breast: Secondary | ICD-10-CM | POA: Diagnosis not present

## 2015-11-16 NOTE — Progress Notes (Signed)
Name: Andrea Randolph   MRN: [id number]    DOB: [birth year]   Date:11/16/2015       Progress Note  Subjective  Chief Complaint  Chief Complaint  Patient presents with  . Annual Exam    HPI  Well Woman: she has been with the same sexual partner for the past three years. She denies dyspareunia, no vaginal discharge, but she had post-menopausal bleeding -spotting for a few days about 2 months ago. She is due for a mammogram. Last pap smear in 2015 was normal   Patient Active Problem List   Diagnosis Date Noted  . Allergic rhinitis, seasonal 05/24/2015  . Benign essential HTN 05/24/2015  . Undiagnosed cardiac murmurs 05/24/2015  . Chronic constipation 05/24/2015  . Diabetes mellitus with renal manifestation ([place]) 05/24/2015  . Dyslipidemia 05/24/2015  . Goiter 05/24/2015  . Acquired hypothyroidism 05/24/2015  . Mixed incontinence 05/24/2015  . Microalbuminuria 05/24/2015  . Iron deficiency anemia 05/24/2015  . Menopause 05/24/2015  . Obstructive apnea 05/24/2015  . Adult BMI 30+ 05/24/2015  . Hypertensive retinopathy 05/24/2015  . Vitamin D deficiency 05/24/2015  . History of shoulder surgery 03/11/2015    Past Surgical History  Procedure Laterality Date  . Foot surgery    . Total shoulder replacement Right 01/25/2015    Dr. Roland Rack    Family History  Problem Relation Age of Onset  . Heart disease Mother   . Heart disease Father   . Kidney disease Sister   . Hypertension Sister   . Diabetes Sister   . Hypertension Brother   . CVA Brother     Social History   Social History  . Marital Status: Married    Spouse Name: N/A  . Number of Children: N/A  . Years of Education: N/A   Occupational History  . Not on file.   Social History Main Topics  . Smoking status: Never Smoker   . Smokeless tobacco: Never Used  . Alcohol Use: No  . Drug Use: No  . Sexual Activity: Not Currently   Other Topics Concern  . Not on file   Social History Narrative     Current  outpatient prescriptions:  .  ammonium lactate (LAC-HYDRIN) 12 % cream, Apply topically as needed for dry skin., Disp: 385 g, Rfl: 0 .  aspirin 81 MG tablet, Take by mouth., Disp: , Rfl:  .  Dapagliflozin-Metformin HCl ER (XIGDUO XR) 08-999 MG TB24, Take 1 tablet by mouth daily., Disp: 30 tablet, Rfl: 2 .  glucose blood (FREESTYLE LITE) test strip, FREESTYLE LITE TEST (In Vitro Strip)  use as directed for 0 days  Quantity: 200.00;  Refills: 3   Ordered :25-Nov-2014  Vonna Kotyk ;  Started 30-Dec-2008 Active Comments: DX: 250.00, Disp: , Rfl:  .  levothyroxine (SYNTHROID, LEVOTHROID) 88 MCG tablet, Take 1 tablet (88 mcg total) by mouth daily., Disp: 30 tablet, Rfl: 0 .  Multiple Vitamin (MULTIVITAMIN) tablet, Take 1 tablet by mouth daily., Disp: , Rfl:  .  Naltrexone-Bupropion HCl ER (CONTRAVE) 8-90 MG TB12, Take 2 tablets by mouth 2 (two) times daily., Disp: 120 tablet, Rfl: 1 .  Olmesartan-Amlodipine-HCTZ (TRIBENZOR) 40-10-25 MG TABS, Take 1 tablet by mouth daily., Disp: 30 tablet, Rfl: 2 .  rosuvastatin (CRESTOR) 5 MG tablet, Take 1 tablet (5 mg total) by mouth daily., Disp: 30 tablet, Rfl: 2  No Known Allergies   ROS  Constitutional: Negative for fever or weight change ( not taking Contrave as prescribed, and has not lost any  weight ).  Respiratory: Negative for cough and shortness of breath.   Cardiovascular: Negative for chest pain or palpitations.  Gastrointestinal: Negative for abdominal pain, no bowel changes.  Musculoskeletal: Negative for gait problem or joint swelling.  Skin: Negative for rash.  Neurological: Negative for dizziness or headache.  No other specific complaints in a complete review of systems (except as listed in HPI above).  Objective  Filed Vitals:   11/16/15 1147  BP: 134/74  Pulse: 94  Temp: 98.2 F (36.8 C)  TempSrc: Oral  Resp: 16  Height: 5\' 5"  (1.651 m)  Weight: 222 lb 3.2 oz (100.789 kg)  SpO2: 98%    Body mass index is 36.98  kg/(m^2).  Physical Exam  Constitutional: Patient appears well-developed and well-nourished. No distress.  HENT: Head: Normocephalic and atraumatic. Ears: B TMs ok, no erythema or effusion; Nose: Nose normal. Mouth/Throat: Oropharynx is clear and moist. No oropharyngeal exudate.  Eyes: Conjunctivae and EOM are normal. Pupils are equal, round, and reactive to light. No scleral icterus.  Neck: Normal range of motion. Neck supple. No JVD present. No thyromegaly present.  Cardiovascular: Normal rate, regular rhythm. Systolic Ejection murmur .Trace  BLE edema. Pulmonary/Chest: Effort normal and breath sounds normal. No respiratory distress. Abdominal: Soft. Bowel sounds are normal, no distension. There is no tenderness. no masses Breast: no lumps or masses, no nipple discharge or rashes FEMALE GENITALIA:  Not done, referring to gyn for post-menopausal bleeding Musculoskeletal: Normal range of motion, no joint effusions. No gross deformities Neurological: he is alert and oriented to person, place, and time. No cranial nerve deficit. Coordination, balance, strength, speech and gait are normal.  Skin: Skin is warm and dry. No rash noted. No erythema.  Psychiatric: Patient has a normal mood and affect. behavior is normal. Judgment and thought content normal.  Recent Results (from the past 2160 hour(s))  POCT HgB A1C     Status: None   Collection Time: 08/29/15 10:42 AM  Result Value Ref Range   Hemoglobin A1C 6.1   Comprehensive metabolic panel     Status: None   Collection Time: 08/29/15 11:35 AM  Result Value Ref Range   Glucose 88 65 - 99 mg/dL   BUN 15 6 - 24 mg/dL   Creatinine, Ser 0.96 0.57 - 1.00 mg/dL   GFR calc non Af Amer 66 >59 mL/min/1.73   GFR calc Af Amer 76 >59 mL/min/1.73   BUN/Creatinine Ratio 16 9 - 23   Sodium 140 136 - 144 mmol/L    Comment:               **Please note reference interval change**   Potassium 3.9 3.5 - 5.2 mmol/L    Comment:               **Please note  reference interval change**   Chloride 103 97 - 106 mmol/L    Comment:               **Please note reference interval change**   CO2 26 18 - 29 mmol/L   Calcium 9.8 8.7 - 10.2 mg/dL   Total Protein 6.9 6.0 - 8.5 g/dL   Albumin 4.3 3.5 - 5.5 g/dL   Globulin, Total 2.6 1.5 - 4.5 g/dL   Albumin/Globulin Ratio 1.7 1.1 - 2.5   Bilirubin Total 0.3 0.0 - 1.2 mg/dL   Alkaline Phosphatase 49 39 - 117 IU/L   AST 18 0 - 40 IU/L   ALT 17 0 - 32 IU/L  Lipid panel     Status: Abnormal   Collection Time: 08/29/15 11:35 AM  Result Value Ref Range   Cholesterol, Total 251 (H) 100 - 199 mg/dL   Triglycerides 84 0 - 149 mg/dL   HDL 56 >39 mg/dL    Comment: According to ATP-III Guidelines, HDL-C >59 mg/dL is considered a negative risk factor for CHD.    VLDL Cholesterol Cal 17 5 - 40 mg/dL   LDL Calculated 178 (H) 0 - 99 mg/dL   Chol/HDL Ratio 4.5 (H) 0.0 - 4.4 ratio units    Comment:                                   T. Chol/HDL Ratio                                             Men  Women                               1/2 Avg.Risk  3.4    3.3                                   Avg.Risk  5.0    4.4                                2X Avg.Risk  9.6    7.1                                3X Avg.Risk 23.4   11.0   Ferritin     Status: None   Collection Time: 08/29/15 11:35 AM  Result Value Ref Range   Ferritin 27 15 - 150 ng/mL  CBC with Differential/Platelet     Status: Abnormal   Collection Time: 08/29/15 11:35 AM  Result Value Ref Range   WBC 4.5 3.4 - 10.8 x10E3/uL   RBC 4.56 3.77 - 5.28 x10E6/uL   Hemoglobin 12.1 11.1 - 15.9 g/dL   Hematocrit 37.6 34.0 - 46.6 %   MCV 83 79 - 97 fL   MCH 26.5 (L) 26.6 - 33.0 pg   MCHC 32.2 31.5 - 35.7 g/dL   RDW 15.1 12.3 - 15.4 %   Platelets 404 (H) 150 - 379 x10E3/uL   Neutrophils 42 %   Lymphs 50 %   Monocytes 5 %   Eos 2 %   Basos 1 %   Neutrophils Absolute 1.9 1.4 - 7.0 x10E3/uL   Lymphocytes Absolute 2.3 0.7 - 3.1 x10E3/uL   Monocytes Absolute  0.2 0.1 - 0.9 x10E3/uL   EOS (ABSOLUTE) 0.1 0.0 - 0.4 x10E3/uL   Basophils Absolute 0.0 0.0 - 0.2 x10E3/uL   Immature Granulocytes 0 %   Immature Grans (Abs) 0.0 0.0 - 0.1 x10E3/uL  Vit D  25 hydroxy (rtn osteoporosis monitoring)     Status: None   Collection Time: 08/29/15 11:35 AM  Result Value Ref Range   Vit D, 25-Hydroxy 36.0 30.0 - 100.0 ng/mL    Comment: Vitamin D deficiency has been defined by the Institute  of Medicine and an Endocrine Society practice guideline as a level of serum 25-OH vitamin D less than 20 ng/mL (1,2). The Endocrine Society went on to further define vitamin D insufficiency as a level between 21 and 29 ng/mL (2). 1. IOM (Institute of Medicine). 2010. Dietary reference    intakes for calcium and D. [place]: The    [employer]. 2. Holick MF, Binkley Ruth, Bischoff-Ferrari HA, et al.    Evaluation, treatment, and prevention of vitamin D    deficiency: an Endocrine Society clinical practice    guideline. JCEM. 2011 Jul; 96(7):1911-30.      PHQ2/9: Depression screen [facility] 2/9 11/16/2015 08/29/2015 05/27/2015  Decreased Interest 0 0 0  Down, Depressed, Hopeless 0 0 0  PHQ - 2 Score 0 0 0    Fall Risk: Fall Risk  11/16/2015 08/29/2015 05/27/2015  Falls in the past year? No No No    Functional Status Survey: Is the patient deaf or have difficulty hearing?: No Does the patient have difficulty seeing, even when wearing glasses/contacts?: Yes Does the patient have difficulty concentrating, remembering, or making decisions?: No Does the patient have difficulty walking or climbing stairs?: No Does the patient have difficulty dressing or bathing?: No Does the patient have difficulty doing errands alone such as visiting a doctor's office or shopping?: No    Assessment & Plan  1. Well woman exam  Discussed importance of 150 minutes of physical activity weekly, eat two servings of fish weekly, eat one serving of tree nuts ( cashews, pistachios,  pecans, almonds.Marland Kitchen) every other day, eat 6 servings of fruit/vegetables daily and drink plenty of water and avoid sweet beverages.   2. Breast cancer screening  - MM Digital Screening; Future  3. Post-menopausal bleeding  - Ambulatory referral to Obstetrics / Gynecology

## 2015-11-16 NOTE — Patient Instructions (Signed)
Discussed importance of 150 minutes of physical activity weekly, eat two servings of fish weekly, eat one serving of tree nuts ( cashews, pistachios, pecans, almonds..) every other day, eat 6 servings of fruit/vegetables daily and drink plenty of water and avoid sweet beverages. 

## 2015-12-07 ENCOUNTER — Ambulatory Visit
Admission: RE | Admit: 2015-12-07 | Discharge: 2015-12-07 | Disposition: A | Discharge status: Home / Self Care | Payer: BLUE CROSS/BLUE SHIELD | Source: Ambulatory Visit | Attending: Family Medicine | Admitting: Family Medicine

## 2015-12-07 DIAGNOSIS — Z1231 Encounter for screening mammogram for malignant neoplasm of breast: Secondary | ICD-10-CM | POA: Diagnosis not present

## 2015-12-07 DIAGNOSIS — Z1239 Encounter for other screening for malignant neoplasm of breast: Secondary | ICD-10-CM

## 2015-12-21 ENCOUNTER — Encounter: Payer: Self-pay | Admitting: Family Medicine

## 2015-12-21 ENCOUNTER — Ambulatory Visit (INDEPENDENT_AMBULATORY_CARE_PROVIDER_SITE_OTHER): Payer: BLUE CROSS/BLUE SHIELD | Admitting: Family Medicine

## 2015-12-21 VITALS — BP 132/76 | HR 84 | Temp 98.6°F | Resp 16 | Ht 65.0 in | Wt 219.6 lb

## 2015-12-21 DIAGNOSIS — G4733 Obstructive sleep apnea (adult) (pediatric): Secondary | ICD-10-CM

## 2015-12-21 DIAGNOSIS — E039 Hypothyroidism, unspecified: Secondary | ICD-10-CM | POA: Diagnosis not present

## 2015-12-21 DIAGNOSIS — N950 Postmenopausal bleeding: Secondary | ICD-10-CM

## 2015-12-21 DIAGNOSIS — E668 Other obesity: Secondary | ICD-10-CM | POA: Diagnosis not present

## 2015-12-21 DIAGNOSIS — E785 Hyperlipidemia, unspecified: Secondary | ICD-10-CM

## 2015-12-21 DIAGNOSIS — IMO0002 Reserved for concepts with insufficient information to code with codable children: Secondary | ICD-10-CM

## 2015-12-21 DIAGNOSIS — G4700 Insomnia, unspecified: Secondary | ICD-10-CM | POA: Diagnosis not present

## 2015-12-21 DIAGNOSIS — I10 Essential (primary) hypertension: Secondary | ICD-10-CM

## 2015-12-21 DIAGNOSIS — E0821 Diabetes mellitus due to underlying condition with diabetic nephropathy: Secondary | ICD-10-CM

## 2015-12-21 LAB — POCT UA - MICROALBUMIN: Microalbumin Ur, POC: 20 mg/L

## 2015-12-21 LAB — POCT GLYCOSYLATED HEMOGLOBIN (HGB A1C): HEMOGLOBIN A1C: 5.9

## 2015-12-21 MED ORDER — OLMESARTAN-AMLODIPINE-HCTZ 40-10-25 MG PO TABS: 1.0000 | ORAL_TABLET | Freq: Every day | ORAL | Status: DC

## 2015-12-21 MED ORDER — CO Q 10 100 MG PO CAPS: 1.0000 | ORAL_CAPSULE | Freq: Every day | ORAL | Status: DC

## 2015-12-21 MED ORDER — NALTREXONE-BUPROPION HCL ER 8-90 MG PO TB12: 2.0000 | ORAL_TABLET | Freq: Two times a day (BID) | ORAL | Status: DC

## 2015-12-21 MED ORDER — DAPAGLIFLOZIN PRO-METFORMIN ER 10-1000 MG PO TB24: 1.0000 | ORAL_TABLET | Freq: Every day | ORAL | Status: DC

## 2015-12-21 MED ORDER — ROSUVASTATIN CALCIUM 5 MG PO TABS: 5.0000 mg | ORAL_TABLET | Freq: Every day | ORAL | Status: DC

## 2015-12-21 NOTE — Progress Notes (Signed)
Name: ZALEY MATSUSHITA   MRN: [id number]    DOB: [birth year]   Date:12/21/2015       Progress Note  Subjective  Chief Complaint  Chief Complaint  Patient presents with  . Medication Refill    4 month F/U, patient states she does not need any refill at this time.  . Hypothyroidism    One of her foot stays dry and takes medication for constipation.  . Diabetes    Does not check her sugar at home.  Marland Kitchen Hypertension    No symptoms   . Hyperlipidemia    Leg cramps  . Obesity    Patient has lost 3 pounds since last visit and is doing 15 minutes walks at lunch    HPI  DM II: she is taking medication as directed, denies side effects, hgbA1C is down to 5.9% , urine micro was 50 in 2016 but is now back to normal at 20. She denies polyphagia, polydipsia, but has polyuria - intermittently.  She is not checking fsbs at home. She is taking a daily aspirin daily, she is back on Crestor, but only every other day. Eye exam is up to date. She is on ARB. She denies hypoglycemic episodes  HTN: she has been compliant with medication and denies side effects of medication. No symptoms orthostatic hypotension. No chest pain or palpitation  Hyperlipidemia: she was taking Crestor 5 mg ( down from 20 because of myalgia ), she is only taking every other day because she still has leg pain.  I explained it may not be the cause of myalgia. We will try adding Co Q 10 and recheck lipid panel  OSA: she has not been compliant with CPAP machine, she states she looks at the mask every night but she is not placing it on her face. Explained the importance of compliance.  Obesity: she states she has been walking one mile during week days but no longer walking on Saturdays. She has health coach at work, she states she has been eating more fruit, and avoiding carbohydrates. She started Qsymia in July and lost weight, but prescription is too expensive. She was switched to Contrave 08/2015, her weight was 224 lbs. She has lost only 5  lbs since, but today she told me she skips doses and usually takes with breakfast. Advised to change and take it with her morning medications, before breakfast.   Patient Active Problem List   Diagnosis Date Noted  . Allergic rhinitis, seasonal 05/24/2015  . Benign essential HTN 05/24/2015  . Undiagnosed cardiac murmurs 05/24/2015  . Chronic constipation 05/24/2015  . Diabetes mellitus with renal manifestation ([place]) 05/24/2015  . Dyslipidemia 05/24/2015  . Goiter 05/24/2015  . Acquired hypothyroidism 05/24/2015  . Mixed incontinence 05/24/2015  . Microalbuminuria 05/24/2015  . Iron deficiency anemia 05/24/2015  . Menopause 05/24/2015  . Obstructive apnea 05/24/2015  . Adult BMI 30+ 05/24/2015  . Hypertensive retinopathy 05/24/2015  . Vitamin D deficiency 05/24/2015  . History of shoulder surgery 03/11/2015    Past Surgical History  Procedure Laterality Date  . Foot surgery    . Total shoulder replacement Right 01/25/2015    Dr. Roland Rack  . Breast biopsy Right 90's    benign core needle     Family History  Problem Relation Age of Onset  . Heart disease Mother   . Heart disease Father   . Kidney disease Sister   . Hypertension Sister   . Diabetes Sister   . Hypertension Brother   .  CVA Brother   . Breast cancer Maternal Aunt 50    Social History   Social History  . Marital Status: Married    Spouse Name: N/A  . Number of Children: N/A  . Years of Education: N/A   Occupational History  . Not on file.   Social History Main Topics  . Smoking status: Never Smoker   . Smokeless tobacco: Never Used  . Alcohol Use: No  . Drug Use: No  . Sexual Activity: Not Currently   Other Topics Concern  . Not on file   Social History Narrative     Current outpatient prescriptions:  .  ammonium lactate (LAC-HYDRIN) 12 % cream, Apply topically as needed for dry skin., Disp: 385 g, Rfl: 0 .  aspirin 81 MG tablet, Take by mouth., Disp: , Rfl:  .  Dapagliflozin-Metformin HCl  ER (XIGDUO XR) 08-999 MG TB24, Take 1 tablet by mouth daily., Disp: 30 tablet, Rfl: 2 .  glucose blood (FREESTYLE LITE) test strip, FREESTYLE LITE TEST (In Vitro Strip)  use as directed for 0 days  Quantity: 200.00;  Refills: 3   Ordered :25-Nov-2014  Vonna Kotyk ;  Started 30-Dec-2008 Active Comments: DX: 250.00, Disp: , Rfl:  .  levothyroxine (SYNTHROID, LEVOTHROID) 88 MCG tablet, Take 1 tablet (88 mcg total) by mouth daily., Disp: 30 tablet, Rfl: 0 .  Multiple Vitamin (MULTIVITAMIN) tablet, Take 1 tablet by mouth daily., Disp: , Rfl:  .  Naltrexone-Bupropion HCl ER (CONTRAVE) 8-90 MG TB12, Take 2 tablets by mouth 2 (two) times daily., Disp: 120 tablet, Rfl: 1 .  Olmesartan-Amlodipine-HCTZ (TRIBENZOR) 40-10-25 MG TABS, Take 1 tablet by mouth daily., Disp: 30 tablet, Rfl: 2 .  rosuvastatin (CRESTOR) 5 MG tablet, Take 1 tablet (5 mg total) by mouth daily., Disp: 30 tablet, Rfl: 2 .  Coenzyme Q10 (CO Q 10) 100 MG CAPS, Take 1 tablet by mouth daily., Disp: 30 capsule, Rfl: 0  No Known Allergies   ROS  Constitutional: Negative for fever and mild  weight change.  Respiratory: Negative for cough and shortness of breath.   Cardiovascular: Negative for chest pain or palpitations.  Gastrointestinal: Negative for abdominal pain, no bowel changes.  Musculoskeletal: Negative for gait problem or joint swelling.  Skin: Negative for rash.  Neurological: Negative for dizziness or headache.  No other specific complaints in a complete review of systems (except as listed in HPI above).  Objective  Filed Vitals:   12/21/15 0814  BP: 132/76  Pulse: 84  Temp: 98.6 F (37 C)  TempSrc: Oral  Resp: 16  Height: 5\' 5"  (1.651 m)  Weight: 219 lb 9.6 oz (99.61 kg)  SpO2: 97%    Body mass index is 36.54 kg/(m^2).  Physical Exam  Constitutional: Patient appears well-developed and well-nourished. Obese  No distress.  HEENT: head atraumatic, normocephalic, pupils equal and reactive to light, ear  normal TM, neck supple, goiter, throat within normal limits Cardiovascular: Normal rate, regular rhythm, systolic ejection murmur 2/6 No BLE edema. Pulmonary/Chest: Effort normal and breath sounds normal. No respiratory distress. Abdominal: Soft.  There is no tenderness. Psychiatric: Patient has a normal mood and affect. behavior is normal. Judgment and thought content normal. Muscular Skeletal: no pain during palpation of lumbar spine, negative straight leg raise  Recent Results (from the past 2160 hour(s))  POCT HgB A1C     Status: Normal   Collection Time: 12/21/15  8:14 AM  Result Value Ref Range   Hemoglobin A1C 5.9   POCT UA -  Microalbumin     Status: Abnormal   Collection Time: 12/21/15  8:14 AM  Result Value Ref Range   Microalbumin Ur, POC 20 mg/L   Creatinine, POC  mg/dL   Albumin/Creatinine Ratio, Urine, POC      Diabetic Foot Exam - Simple   Simple Foot Form  Diabetic Foot exam was performed with the following findings:  Yes 12/21/2015  8:38 AM  Visual Inspection  No deformities, no ulcerations, no other skin breakdown bilaterally:  Yes  Sensation Testing  Intact to touch and monofilament testing bilaterally:  Yes  Pulse Check  Posterior Tibialis and Dorsalis pulse intact bilaterally:  Yes  Comments     PHQ2/9: Depression screen [facility] 2/9 11/16/2015 08/29/2015 05/27/2015  Decreased Interest 0 0 0  Down, Depressed, Hopeless 0 0 0  PHQ - 2 Score 0 0 0     Fall Risk: Fall Risk  11/16/2015 08/29/2015 05/27/2015  Falls in the past year? No No No     Assessment & Plan  1. Diabetes mellitus due to underlying condition with diabetic nephropathy, without long-term current use of insulin (HCC)  At goal, continue medication for now, we will decrease dose on her next visit if hgbA1C remains low - POCT HgB A1C - POCT UA - Microalbumin - Dapagliflozin-Metformin HCl ER (XIGDUO XR) 08-999 MG TB24; Take 1 tablet by mouth daily.  Dispense: 30 tablet; Refill: 2  2. Acquired  hypothyroidism  Continue follow up with Endo  3. Dyslipidemia  Recheck labs, add CoQ10, advised back stretching exercises, consider chiropractor and massage - rosuvastatin (CRESTOR) 5 MG tablet; Take 1 tablet (5 mg total) by mouth daily.  Dispense: 30 tablet; Refill: 2 - Lipid panel - Coenzyme Q10 (CO Q 10) 100 MG CAPS; Take 1 tablet by mouth daily.  Dispense: 30 capsule; Refill: 0  4. Obstructive apnea  Explained importance of CPAP machine use to decrease risk of stroke and MI  5. Insomnia, persistent  Secondary to hot flashes  6. Adult BMI 30+  Continue Contrave, try to take before breakfast  7. Benign essential HTN  At goal  - Olmesartan-Amlodipine-HCTZ (TRIBENZOR) 40-10-25 MG TABS; Take 1 tablet by mouth daily.  Dispense: 30 tablet; Refill: 2   8. Post-menopausal bleeding  Referral made to GYN, but she never got a call back, we will make the referral again.

## 2015-12-21 NOTE — Patient Instructions (Signed)

## 2015-12-22 LAB — LIPID PANEL
CHOLESTEROL TOTAL: 174 mg/dL (ref 100–199)
Chol/HDL Ratio: 2.9 ratio units (ref 0.0–4.4)
HDL: 60 mg/dL (ref >39)
LDL CALC: 103 mg/dL — AB (ref 0–99)
Triglycerides: 54 mg/dL (ref 0–149)
VLDL CHOLESTEROL CAL: 11 mg/dL (ref 5–40)

## 2016-01-03 ENCOUNTER — Ambulatory Visit (INDEPENDENT_AMBULATORY_CARE_PROVIDER_SITE_OTHER): Payer: BLUE CROSS/BLUE SHIELD | Admitting: Obstetrics and Gynecology

## 2016-01-03 ENCOUNTER — Encounter: Payer: Self-pay | Admitting: Obstetrics and Gynecology

## 2016-01-03 VITALS — BP 124/77 | HR 80 | Ht 65.0 in | Wt 216.9 lb

## 2016-01-03 DIAGNOSIS — N950 Postmenopausal bleeding: Secondary | ICD-10-CM | POA: Diagnosis not present

## 2016-01-03 NOTE — Patient Instructions (Signed)
1. Ultrasound scheduled 2. Return in 1 week after ultrasound for probable endometrial biopsy and further management planning

## 2016-01-03 NOTE — Progress Notes (Signed)
Chief complaint: 1. Postmenopausal bleeding  Patient is a 60 year old female para 1001, last menstrual period 2 years ago, presents in referral from Dr. Teola Bradley for evaluation of postmenopausal bleeding which occurred 3 months ago. Patient reports one half days of spotting. She is not on any HRT therapy. No history of pelvic pain.  Past Medical History  Diagnosis Date  . Microalbuminuria     DM  . Muscle cramps   . Arthritis of right shoulder region     Dr. Tamala Julian  . Chronic insomnia   . Radiculitis, lumbosacral   . Hypertension   . Diabetes mellitus without complication ([place])   . Hyperlipidemia   . Vitamin D deficiency   . Hypothyroidism, adult     Dr. Gabriel Carina  . Obesity   . Cardiac murmur   . Inflammation of urethra   . Anemia   . Goiter     Dr. Gabriel Carina  . Mixed incontinence   . Allergy   . Hypertensive retinopathy of left eye   . OSA (obstructive sleep apnea)   . Menopause    Past Surgical History  Procedure Laterality Date  . Foot surgery    . Total shoulder replacement Right 01/25/2015    Dr. Roland Rack  . Breast biopsy Right 90's    benign core needle     Review of systems: Per history of present illness  OBJECTIVE: BP 124/77 mmHg  Pulse 80  Ht 5\' 5"  (1.651 m)  Wt 216 lb 14.4 oz (98.385 kg)  BMI 36.09 kg/m2 Physical exam-deferred  ASSESSMENT: 1. Postmenopausal bleeding 3 months ago 2. Comorbidities include hypertension, diabetes mellitus type 2, and increased BMI  PLAN: 1. Pelvic ultrasound 2. Return in 1 week after ultrasound for possible endometrial biopsy and further management planning  Brayton Mars, MD   A total of 20 minutes were spent face-to-face with the patient during the encounter with greater than 50% dealing with counseling and coordination of care.  Note: This dictation was prepared with Dragon dictation along with smaller phrase technology. Any transcriptional errors that result from this process are unintentional.

## 2016-01-04 ENCOUNTER — Ambulatory Visit (INDEPENDENT_AMBULATORY_CARE_PROVIDER_SITE_OTHER): Payer: BLUE CROSS/BLUE SHIELD

## 2016-01-04 DIAGNOSIS — N950 Postmenopausal bleeding: Secondary | ICD-10-CM | POA: Diagnosis not present

## 2016-01-11 ENCOUNTER — Ambulatory Visit (INDEPENDENT_AMBULATORY_CARE_PROVIDER_SITE_OTHER): Payer: BLUE CROSS/BLUE SHIELD | Admitting: Obstetrics and Gynecology

## 2016-01-11 ENCOUNTER — Encounter: Payer: Self-pay | Admitting: Obstetrics and Gynecology

## 2016-01-11 VITALS — BP 150/83 | HR 78 | Ht 65.0 in | Wt 220.5 lb

## 2016-01-11 DIAGNOSIS — N950 Postmenopausal bleeding: Secondary | ICD-10-CM

## 2016-01-11 DIAGNOSIS — D259 Leiomyoma of uterus, unspecified: Secondary | ICD-10-CM | POA: Insufficient documentation

## 2016-01-11 DIAGNOSIS — D251 Intramural leiomyoma of uterus: Secondary | ICD-10-CM | POA: Diagnosis not present

## 2016-01-11 NOTE — Progress Notes (Signed)
Chief complaint: 1. Post menopausal bleeding 2. Follow-up on ultrasound 3. Endometrial biopsy  Patient presents for above issues. Ultrasound demonstrated small intramural fibroid less than 1 cm. Endometrium was prominent at 7.2 mm. Biopsy is to be performed today.  OBJECTIVE: BP 150/83 mmHg  Pulse 78  Ht 5\' 5"  (1.651 m)  Wt 220 lb 8 oz (100.018 kg)  BMI 36.69 kg/m2 Pelvic exam: Bimanual-midplane uterus, normal size and shape, nontender  PROCEDURE: Endometrial biopsy Indications: Postmenopausal bleeding; prominent endometrium 7.2 mm Bimanual exam: Midplane uterus Uterine sounding: 8 cm Specimen: Scant  Description: Patient was placed in the dorsal lithotomy position. A Peterson speculum was inserted into the vagina. 3 mm pipette was introduced with sterile technique through the endocervical canal into the uterus; depth was 8 cm. Scant tissue was obtained with catheter. Minimal bleeding was encountered. Procedure was well-tolerated. Specimen was sent to pathology.  ASSESSMENT: 1. Small uterine fibroid on ultrasound along with a prominent endometrial stripe of 7.2 mm in postmenopausal patient  PLAN: 1. Ultrasound findings were reviewed with patient 2. Endometrial biopsy is done as noted 3. Patient will be notified by phone results 4. Patient understands that any light spotting can be tolerated until 06/12/2016. Should she she have any further bleeding after that time she should contact us for further evaluation  A total of 15 minutes were spent face-to-face with the patient during this encounter and over half of that time dealt with counseling and coordination of care.  Brayton Mars, MD  Note: This dictation was prepared with Dragon dictation along with smaller phrase technology. Any transcriptional errors that result from this process are unintentional.

## 2016-01-11 NOTE — Patient Instructions (Signed)
1. Endometrial biopsy is done today 2. Watch for any abnormal uterine bleeding.light spotting can be tolerated up until 06/12/2016. If bleeding developed after 06/12/2016, please contact us for reassessment

## 2016-01-16 LAB — PATHOLOGY

## 2016-03-20 ENCOUNTER — Encounter: Payer: Self-pay | Admitting: Family Medicine

## 2016-03-20 ENCOUNTER — Ambulatory Visit (INDEPENDENT_AMBULATORY_CARE_PROVIDER_SITE_OTHER): Payer: BLUE CROSS/BLUE SHIELD | Admitting: Family Medicine

## 2016-03-20 VITALS — BP 124/86 | HR 93 | Temp 98.4°F | Resp 16 | Ht 65.0 in | Wt 215.4 lb

## 2016-03-20 DIAGNOSIS — E039 Hypothyroidism, unspecified: Secondary | ICD-10-CM | POA: Diagnosis not present

## 2016-03-20 DIAGNOSIS — IMO0002 Reserved for concepts with insufficient information to code with codable children: Secondary | ICD-10-CM

## 2016-03-20 DIAGNOSIS — I10 Essential (primary) hypertension: Secondary | ICD-10-CM | POA: Diagnosis not present

## 2016-03-20 DIAGNOSIS — E1121 Type 2 diabetes mellitus with diabetic nephropathy: Secondary | ICD-10-CM

## 2016-03-20 DIAGNOSIS — R202 Paresthesia of skin: Secondary | ICD-10-CM | POA: Diagnosis not present

## 2016-03-20 DIAGNOSIS — N951 Menopausal and female climacteric states: Secondary | ICD-10-CM | POA: Diagnosis not present

## 2016-03-20 DIAGNOSIS — N950 Postmenopausal bleeding: Secondary | ICD-10-CM | POA: Diagnosis not present

## 2016-03-20 DIAGNOSIS — R232 Flushing: Secondary | ICD-10-CM

## 2016-03-20 DIAGNOSIS — G4700 Insomnia, unspecified: Secondary | ICD-10-CM

## 2016-03-20 DIAGNOSIS — E668 Other obesity: Secondary | ICD-10-CM | POA: Diagnosis not present

## 2016-03-20 DIAGNOSIS — E785 Hyperlipidemia, unspecified: Secondary | ICD-10-CM

## 2016-03-20 DIAGNOSIS — G4733 Obstructive sleep apnea (adult) (pediatric): Secondary | ICD-10-CM

## 2016-03-20 DIAGNOSIS — E042 Nontoxic multinodular goiter: Secondary | ICD-10-CM | POA: Diagnosis not present

## 2016-03-20 LAB — POCT GLYCOSYLATED HEMOGLOBIN (HGB A1C): Hemoglobin A1C: 6.3

## 2016-03-20 MED ORDER — NALTREXONE-BUPROPION HCL ER 8-90 MG PO TB12: 2.0000 | ORAL_TABLET | Freq: Two times a day (BID) | ORAL | Status: DC

## 2016-03-20 MED ORDER — DAPAGLIFLOZIN PRO-METFORMIN ER 10-1000 MG PO TB24: 1.0000 | ORAL_TABLET | Freq: Every day | ORAL | Status: DC

## 2016-03-20 MED ORDER — CLONIDINE HCL 0.1 MG PO TABS: 0.1000 mg | ORAL_TABLET | Freq: Three times a day (TID) | ORAL | Status: DC

## 2016-03-20 MED ORDER — ROSUVASTATIN CALCIUM 5 MG PO TABS: 5.0000 mg | ORAL_TABLET | Freq: Every day | ORAL | Status: DC

## 2016-03-20 MED ORDER — OLMESARTAN-AMLODIPINE-HCTZ 40-10-25 MG PO TABS: 1.0000 | ORAL_TABLET | Freq: Every day | ORAL | Status: DC

## 2016-03-20 NOTE — Progress Notes (Signed)
Name: Andrea Randolph   MRN: [id number]    DOB: 1956-02-09   Date:03/20/2016       Progress Note  Subjective  Chief Complaint  Chief Complaint  Patient presents with  . Diabetes    patient is here for her 61-month f/u. she does not check her blood sugar often.  . Numbness    patient presents with intermittent numbness in her right fingers    HPI  DM II: she is taking medication as directed, denies side effects, hgbA1C was down to  5.9% , and is 6.3% today,  urine micro was 50 in 2016 but is now back to normal at 20. She denies polyphagia, polydipsia, but has polyuria - intermittently.  She is not checking fsbs at home. She is taking a daily aspirin daily,  Crestor, but only every other day. Eye exam is up to date. She is on ARB. She denies hypoglycemic episodes.   HTN: she has been compliant with medication and denies side effects of medication. No symptoms orthostatic hypotension. No chest pain or palpitation.   Hyperlipidemia: she is taking  Crestor 5 mg ( down from 20 because of myalgia ), she is only taking every other day last lipid was much better   OSA: she has not been compliant with CPAP machine, she states she looks at the mask every night but she is not placing it on her face. Explained the importance of compliance. Discussed risk of strokes and heart attacks.   Obesity: she states she has been going to [place] classed 3 times weekly, also line dancing, and walking. She has health coach at work, she states she has been eating more fruit, and avoiding carbohydrates. She started Qsymia in July 2016  and lost weight, but prescription was  too expensive. She was switched to Contrave 08/2015, her weight was 224 lbs. She has lost only 9  lbs since. She states she forgets to take medication in the afternoon. Still responding to medication.   Right arm paresthesias: she has noticed numbness and tingling started about one month ago with pain on elbow and tingling going down 4th and 5th fingers  of right hand. No longer painful just tingling intermittently. No sure what aggravates or improves symptoms. Discussed EMG or referral to Ortho but she would like to hold off since she is feeling better   Post menopausal bleeding : seen by GYN, biopsy showed polyp and uterine fibroid, negative for cancer, no symptoms since.   Patient Active Problem List   Diagnosis Date Noted  . Uterine fibroid 01/11/2016  . PMB (postmenopausal bleeding) 01/03/2016  . Allergic rhinitis, seasonal 05/24/2015  . Benign essential HTN 05/24/2015  . Undiagnosed cardiac murmurs 05/24/2015  . Chronic constipation 05/24/2015  . Diabetes mellitus with renal manifestation ([place]) 05/24/2015  . Dyslipidemia 05/24/2015  . Goiter 05/24/2015  . Acquired hypothyroidism 05/24/2015  . Mixed incontinence 05/24/2015  . Microalbuminuria 05/24/2015  . Iron deficiency anemia 05/24/2015  . Menopause 05/24/2015  . Obstructive apnea 05/24/2015  . Adult BMI 30+ 05/24/2015  . Hypertensive retinopathy 05/24/2015  . Vitamin D deficiency 05/24/2015  . History of shoulder surgery 03/11/2015    Past Surgical History  Procedure Laterality Date  . Foot surgery    . Total shoulder replacement Right 01/25/2015    Dr. Roland Rack  . Breast biopsy Right 90's    benign core needle     Family History  Problem Relation Age of Onset  . Heart disease Mother   . Heart  disease Father   . Kidney disease Sister   . Hypertension Sister   . Diabetes Sister   . Hypertension Brother   . CVA Brother   . Breast cancer Maternal Aunt 67  . Ovarian cancer Neg Hx   . Colon cancer Neg Hx     Social History   Social History  . Marital Status: Married    Spouse Name: N/A  . Number of Children: N/A  . Years of Education: N/A   Occupational History  . Not on file.   Social History Main Topics  . Smoking status: Never Smoker   . Smokeless tobacco: Never Used  . Alcohol Use: No  . Drug Use: No  . Sexual Activity: Not Currently    Birth  Control/ Protection: Post-menopausal   Other Topics Concern  . Not on file   Social History Narrative     Current outpatient prescriptions:  .  ammonium lactate (LAC-HYDRIN) 12 % cream, Apply topically as needed for dry skin., Disp: 385 g, Rfl: 0 .  aspirin 81 MG tablet, Take by mouth., Disp: , Rfl:  .  Coenzyme Q10 (CO Q 10) 100 MG CAPS, Take 1 tablet by mouth daily., Disp: 30 capsule, Rfl: 0 .  Dapagliflozin-Metformin HCl ER (XIGDUO XR) 08-999 MG TB24, Take 1 tablet by mouth daily., Disp: 30 tablet, Rfl: 2 .  glucose blood (FREESTYLE LITE) test strip, FREESTYLE LITE TEST (In Vitro Strip)  use as directed for 0 days  Quantity: 200.00;  Refills: 3   Ordered :25-Nov-2014  Vonna Kotyk ;  Started 30-Dec-2008 Active Comments: DX: 250.00, Disp: , Rfl:  .  levothyroxine (SYNTHROID, LEVOTHROID) 88 MCG tablet, Take 1 tablet (88 mcg total) by mouth daily., Disp: 30 tablet, Rfl: 0 .  Multiple Vitamin (MULTIVITAMIN) tablet, Take 1 tablet by mouth daily., Disp: , Rfl:  .  Naltrexone-Bupropion HCl ER (CONTRAVE) 8-90 MG TB12, Take 2 tablets by mouth 2 (two) times daily., Disp: 120 tablet, Rfl: 2 .  Olmesartan-Amlodipine-HCTZ (TRIBENZOR) 40-10-25 MG TABS, Take 1 tablet by mouth daily., Disp: 30 tablet, Rfl: 2 .  rosuvastatin (CRESTOR) 5 MG tablet, Take 1 tablet (5 mg total) by mouth daily., Disp: 30 tablet, Rfl: 2  No Known Allergies   ROS  Constitutional: Negative for fever, positive for  weight change.  Respiratory: Negative for cough and shortness of breath.   Cardiovascular: Negative for chest pain or palpitations.  Gastrointestinal: Negative for abdominal pain, no bowel changes.  Musculoskeletal: Negative for gait problem or joint swelling.  Skin: Negative for rash.  Neurological: Negative for dizziness or headache.  No other specific complaints in a complete review of systems (except as listed in HPI above).  Objective  Filed Vitals:   03/20/16 0853  BP: 124/86  Pulse: 93  Temp:  98.4 F (36.9 C)  TempSrc: Oral  Resp: 16  Height: 5\' 5"  (1.651 m)  Weight: 215 lb 6.4 oz (97.705 kg)  SpO2: 98%    Body mass index is 35.84 kg/(m^2).  Physical Exam  Constitutional: Patient appears well-developed and well-nourished. Obese  No distress.  HEENT: head atraumatic, normocephalic, pupils equal and reactive to light, ears normal TM bilaterally neck supple, throat within normal limits, goiter  Cardiovascular: Normal rate, regular rhythm and normal heart sounds.  No murmur heard. No BLE edema. Pulmonary/Chest: Effort normal and breath sounds normal. No respiratory distress. Abdominal: Soft.  There is no tenderness. Psychiatric: Patient has a normal mood and affect. behavior is normal. Judgment and thought content normal.  PHQ2/9: Depression screen [facility] 2/9 03/20/2016 11/16/2015 08/29/2015 05/27/2015  Decreased Interest 0 0 0 0  Down, Depressed, Hopeless 0 0 0 0  PHQ - 2 Score 0 0 0 0    Fall Risk: Fall Risk  03/20/2016 11/16/2015 08/29/2015 05/27/2015  Falls in the past year? No No No No    Functional Status Survey: Is the patient deaf or have difficulty hearing?: No Does the patient have difficulty seeing, even when wearing glasses/contacts?: No Does the patient have difficulty concentrating, remembering, or making decisions?: No Does the patient have difficulty walking or climbing stairs?: No Does the patient have difficulty dressing or bathing?: No Does the patient have difficulty doing errands alone such as visiting a doctor's office or shopping?: No    Assessment & Plan  1. Type 2 diabetes mellitus with diabetic nephropathy, without long-term current use of insulin (HCC)  - POCT glycosylated hemoglobin (Hb A1C) 6.3%   2. Acquired hypothyroidism  Continue follow up with Dr. Gabriel Carina  3. Dyslipidemia  - rosuvastatin (CRESTOR) 5 MG tablet; Take 1 tablet (5 mg total) by mouth daily.  Dispense: 30 tablet; Refill: 2  4. Obstructive apnea  Needs to resume CPAP  machine   5. Insomnia, persistent  She states secondary to hot flashes, discussed options and she is willing to take Clonidine   6. Post-menopausal bleeding  Resolved  7. Benign essential HTN  - Olmesartan-Amlodipine-HCTZ (TRIBENZOR) 40-10-25 MG TABS; Take 1 tablet by mouth daily.  Dispense: 30 tablet; Refill: 2  8. Adult BMI 30+  - Naltrexone-Bupropion HCl ER (CONTRAVE) 8-90 MG TB12; Take 2 tablets by mouth 2 (two) times daily.  Dispense: 120 tablet; Refill: 2   9. Paresthesia of arm  Discussed likely ulnar nerve entrapment, she will call back when ready to see Ortho or have EMG studies  10. Hot flashes  - cloNIDine (CATAPRES) 0.1 MG tablet; Take 1 tablet (0.1 mg total) by mouth 3 (three) times daily.  Dispense: 30 tablet; Refill: 2

## 2016-03-30 DIAGNOSIS — E042 Nontoxic multinodular goiter: Secondary | ICD-10-CM | POA: Diagnosis not present

## 2016-03-30 DIAGNOSIS — E039 Hypothyroidism, unspecified: Secondary | ICD-10-CM | POA: Diagnosis not present

## 2016-04-27 DIAGNOSIS — E042 Nontoxic multinodular goiter: Secondary | ICD-10-CM | POA: Diagnosis not present

## 2016-04-28 DIAGNOSIS — E042 Nontoxic multinodular goiter: Secondary | ICD-10-CM | POA: Diagnosis not present

## 2016-05-25 DIAGNOSIS — E039 Hypothyroidism, unspecified: Secondary | ICD-10-CM | POA: Diagnosis not present

## 2016-05-30 DIAGNOSIS — E039 Hypothyroidism, unspecified: Secondary | ICD-10-CM | POA: Diagnosis not present

## 2016-05-30 DIAGNOSIS — E042 Nontoxic multinodular goiter: Secondary | ICD-10-CM | POA: Diagnosis not present

## 2016-06-20 ENCOUNTER — Encounter: Payer: Self-pay | Admitting: Family Medicine

## 2016-06-20 ENCOUNTER — Ambulatory Visit (INDEPENDENT_AMBULATORY_CARE_PROVIDER_SITE_OTHER): Payer: BLUE CROSS/BLUE SHIELD | Admitting: Family Medicine

## 2016-06-20 VITALS — BP 124/76 | HR 78 | Temp 98.5°F | Resp 16 | Ht 65.0 in | Wt 208.7 lb

## 2016-06-20 DIAGNOSIS — E1121 Type 2 diabetes mellitus with diabetic nephropathy: Secondary | ICD-10-CM | POA: Diagnosis not present

## 2016-06-20 DIAGNOSIS — G4700 Insomnia, unspecified: Secondary | ICD-10-CM | POA: Diagnosis not present

## 2016-06-20 DIAGNOSIS — R208 Other disturbances of skin sensation: Secondary | ICD-10-CM

## 2016-06-20 DIAGNOSIS — B353 Tinea pedis: Secondary | ICD-10-CM

## 2016-06-20 DIAGNOSIS — I10 Essential (primary) hypertension: Secondary | ICD-10-CM

## 2016-06-20 DIAGNOSIS — E039 Hypothyroidism, unspecified: Secondary | ICD-10-CM | POA: Diagnosis not present

## 2016-06-20 DIAGNOSIS — R200 Anesthesia of skin: Secondary | ICD-10-CM

## 2016-06-20 DIAGNOSIS — E785 Hyperlipidemia, unspecified: Secondary | ICD-10-CM

## 2016-06-20 DIAGNOSIS — G4733 Obstructive sleep apnea (adult) (pediatric): Secondary | ICD-10-CM

## 2016-06-20 DIAGNOSIS — E668 Other obesity: Secondary | ICD-10-CM

## 2016-06-20 DIAGNOSIS — N951 Menopausal and female climacteric states: Secondary | ICD-10-CM | POA: Diagnosis not present

## 2016-06-20 DIAGNOSIS — R232 Flushing: Secondary | ICD-10-CM

## 2016-06-20 DIAGNOSIS — IMO0002 Reserved for concepts with insufficient information to code with codable children: Secondary | ICD-10-CM

## 2016-06-20 LAB — GLUCOSE, POCT (MANUAL RESULT ENTRY): POC GLUCOSE: 97 mg/dL (ref 70–99)

## 2016-06-20 LAB — POCT GLYCOSYLATED HEMOGLOBIN (HGB A1C): HEMOGLOBIN A1C: 6

## 2016-06-20 MED ORDER — ROSUVASTATIN CALCIUM 5 MG PO TABS: 5.0000 mg | ORAL_TABLET | Freq: Every day | ORAL | 2 refills | Status: DC

## 2016-06-20 MED ORDER — DAPAGLIFLOZIN PRO-METFORMIN ER 10-500 MG PO TB24: 1.0000 | ORAL_TABLET | Freq: Every day | ORAL | 2 refills | Status: DC

## 2016-06-20 MED ORDER — KETOCONAZOLE 2 % EX CREA: 1.0000 "application " | TOPICAL_CREAM | Freq: Every day | CUTANEOUS | 0 refills | Status: DC

## 2016-06-20 MED ORDER — OLMESARTAN-AMLODIPINE-HCTZ 40-5-25 MG PO TABS: 1.0000 | ORAL_TABLET | Freq: Every day | ORAL | 2 refills | Status: DC

## 2016-06-20 MED ORDER — NALTREXONE-BUPROPION HCL ER 8-90 MG PO TB12: 2.0000 | ORAL_TABLET | Freq: Two times a day (BID) | ORAL | 2 refills | Status: DC

## 2016-06-20 MED ORDER — CLONIDINE HCL 0.1 MG PO TABS: 0.1000 mg | ORAL_TABLET | Freq: Three times a day (TID) | ORAL | 2 refills | Status: DC

## 2016-06-20 NOTE — Progress Notes (Signed)
Name: Andrea Randolph   MRN: [id number]    DOB: 1956/05/30   Date:06/20/2016       Progress Note  Subjective  Chief Complaint  Chief Complaint  Patient presents with  . Diabetes    3 month follow up  . Hypertension  . Hyperlipidemia    HPI  DM II: she is taking medication as directed, denies side effects, hgbA1C was down to  5.9% , up to 6.3% and now down to 6.0%   urine micro was 50 in 2016 but  back to normal at 02 Jan 2016. She denies polyphagia, polydipsia, but has polyuria - intermittently.  She is not checking fsbs at home. She is taking a daily aspirin daily,  Crestor, but only every other day. Eye exam is up to date. She is on ARB. She denies hypoglycemic episodes.   HTN: she has been compliant with medication and denies side effects of medication. No chest pain or palpitation. She is exercising at least 5 times weekly and has noticed some dizziness ( light headed ) during activity   Hyperlipidemia: she is taking  Crestor 5 mg ( down from 20 because of myalgia ), she is only taking every other day last lipid was much better, she would like to have it rechecked today   OSA: she has not been compliant with CPAP machine, she states she looks at the mask every night but she is not placing it on her face. Explained the importance of compliance. Discussed risk of strokes and heart attacks.   Obesity: she states she has been going to [place] classed 3 times weekly, also line dancing, and walking, she is going to her gym at least 5 times weekly.  She has health coach at work, she states she has been eating more fruit, and avoiding carbohydrates, eating lean meat. She started Qsymia in July 2016  and lost weight, but prescription was  too expensive. She was switched to Contrave 08/2015, her weight was 224 lbs. She has lost only 16  lbs since. She is doing better about taking it twice daily   Right arm paresthesias: she has noticed numbness and tingling started about one month ago with pain  on elbow and tingling going down 4th and 5th fingers of right hand. No longer painful just tingling intermittently. Symptoms resolved.   Tinea pedis: she has maceration on 4th - 5th toe web. She also has left dry foot, LacHytrin has not really helped  Numbness feet: she states that about 3 times weekly she develops numbness on both feet, that happen while sitting or standing, the entire foot goes numb, she has to move foot around and symptoms resolved.    Patient Active Problem List   Diagnosis Date Noted  . Paresthesia of arm 03/20/2016  . Uterine fibroid 01/11/2016  . PMB (postmenopausal bleeding) 01/03/2016  . Allergic rhinitis, seasonal 05/24/2015  . Benign essential HTN 05/24/2015  . Undiagnosed cardiac murmurs 05/24/2015  . Chronic constipation 05/24/2015  . Diabetes mellitus with renal manifestation ([place]) 05/24/2015  . Dyslipidemia 05/24/2015  . Goiter 05/24/2015  . Acquired hypothyroidism 05/24/2015  . Mixed incontinence 05/24/2015  . Microalbuminuria 05/24/2015  . Iron deficiency anemia 05/24/2015  . Menopause 05/24/2015  . Obstructive apnea 05/24/2015  . Adult BMI 30+ 05/24/2015  . Hypertensive retinopathy 05/24/2015  . Vitamin D deficiency 05/24/2015  . History of shoulder surgery 03/11/2015    Past Surgical History:  Procedure Laterality Date  . BREAST BIOPSY Right 90's   benign  core needle   . FOOT SURGERY    . TOTAL SHOULDER REPLACEMENT Right 01/25/2015   Dr. Roland Rack    Family History  Problem Relation Age of Onset  . Heart disease Mother   . Heart disease Father   . Kidney disease Sister   . Hypertension Sister   . Diabetes Sister   . Hypertension Brother   . CVA Brother   . Breast cancer Maternal Aunt 14  . Ovarian cancer Neg Hx   . Colon cancer Neg Hx     Social History   Social History  . Marital status: Married    Spouse name: N/A  . Number of children: N/A  . Years of education: N/A   Occupational History  . Not on file.   Social  History Main Topics  . Smoking status: Never Smoker  . Smokeless tobacco: Never Used  . Alcohol use No  . Drug use: No  . Sexual activity: Not Currently    Birth control/ protection: Post-menopausal   Other Topics Concern  . Not on file   Social History Narrative  . No narrative on file     Current Outpatient Prescriptions:  .  ammonium lactate (LAC-HYDRIN) 12 % cream, Apply topically as needed for dry skin., Disp: 385 g, Rfl: 0 .  aspirin 81 MG tablet, Take by mouth., Disp: , Rfl:  .  cloNIDine (CATAPRES) 0.1 MG tablet, Take 1 tablet (0.1 mg total) by mouth 3 (three) times daily., Disp: 30 tablet, Rfl: 2 .  glucose blood (FREESTYLE LITE) test strip, FREESTYLE LITE TEST (In Vitro Strip)  use as directed for 0 days  Quantity: 200.00;  Refills: 3   Ordered :25-Nov-2014  Vonna Kotyk ;  Started 30-Dec-2008 Active Comments: DX: 250.00, Disp: , Rfl:  .  levothyroxine (SYNTHROID, LEVOTHROID) 88 MCG tablet, Take 1 tablet (88 mcg total) by mouth daily., Disp: 30 tablet, Rfl: 0 .  Multiple Vitamin (MULTIVITAMIN) tablet, Take 1 tablet by mouth daily., Disp: , Rfl:  .  Naltrexone-Bupropion HCl ER (CONTRAVE) 8-90 MG TB12, Take 2 tablets by mouth 2 (two) times daily., Disp: 120 tablet, Rfl: 2 .  rosuvastatin (CRESTOR) 5 MG tablet, Take 1 tablet (5 mg total) by mouth daily., Disp: 30 tablet, Rfl: 2 .  Coenzyme Q10 (CO Q 10) 100 MG CAPS, Take 1 tablet by mouth daily. (Patient not taking: Reported on 06/20/2016), Disp: 30 capsule, Rfl: 0 .  Dapagliflozin-Metformin HCl ER (XIGDUO XR) 10-500 MG TB24, Take 1 tablet by mouth daily., Disp: 30 tablet, Rfl: 2 .  Olmesartan-Amlodipine-HCTZ 40-5-25 MG TABS, Take 1 tablet by mouth daily., Disp: 30 tablet, Rfl: 2  No Known Allergies   ROS  Constitutional: Negative for fever or weight change.  Respiratory: Negative for cough and shortness of breath.   Cardiovascular: Negative for chest pain or palpitations.  Gastrointestinal: Negative for abdominal pain,  no bowel changes.  Musculoskeletal: Negative for gait problem or joint swelling.  Skin: Negative for rash.  Neurological: Negative for dizziness or headache.  No other specific complaints in a complete review of systems (except as listed in HPI above).  Objective  Vitals:   06/20/16 0851  BP: 124/76  Pulse: 78  Resp: 16  Temp: 98.5 F (36.9 C)  TempSrc: Oral  SpO2: 98%  Weight: 208 lb 11.2 oz (94.7 kg)  Height: 5\' 5"  (1.651 m)    Body mass index is 34.73 kg/m.  Physical Exam  Constitutional: Patient appears well-developed and well-nourished. Obese  No distress.  HEENT: head atraumatic, normocephalic, pupils equal and reactive to light, neck supple, throat within normal limits. Large goiter Cardiovascular: Normal rate, regular rhythm and normal heart sounds.  2/6 SEM No BLE edema. Pulmonary/Chest: Effort normal and also breath soundsl. No respiratory distress. Abdominal: Soft.  There is no tenderness. Psychiatric: Patient has a normal mood and affect. behavior is normal. Judgment and thought content normal. Skin: maceration on right toe web ( 4th - 5th ) toes, dry right foot Neurological: no focal findings, normal foot exam  Recent Results (from the past 2160 hour(s))  POCT Glucose (CBG)     Status: None   Collection Time: 06/20/16  8:55 AM  Result Value Ref Range   POC Glucose 97 70 - 99 mg/dl  POCT HgB A1C     Status: None   Collection Time: 06/20/16  8:57 AM  Result Value Ref Range   Hemoglobin A1C 6.0      PHQ2/9: Depression screen [facility] 2/9 03/20/2016 11/16/2015 08/29/2015 05/27/2015  Decreased Interest 0 0 0 0  Down, Depressed, Hopeless 0 0 0 0  PHQ - 2 Score 0 0 0 0     Fall Risk: Fall Risk  03/20/2016 11/16/2015 08/29/2015 05/27/2015  Falls in the past year? No No No No     Assessment & Plan  1. Type 2 diabetes with nephropathy (HCC)  We will decrease dose of Xigduo XR from 08/999 to 10/500 since she is doing so well  - POCT HgB A1C - POCT Glucose  (CBG) - Dapagliflozin-Metformin HCl ER (XIGDUO XR) 10-500 MG TB24; Take 1 tablet by mouth daily.  Dispense: 30 tablet; Refill: 2  2. Acquired hypothyroidism  Continue follow up with Dr. Gabriel Carina   3. Dyslipidemia  - rosuvastatin (CRESTOR) 5 MG tablet; Take 1 tablet (5 mg total) by mouth daily.  Dispense: 30 tablet; Refill: 2  4. Obstructive apnea  Discussed importance of CPAP use again   5. Insomnia, persistent  - cloNIDine (CATAPRES) 0.1 MG tablet; Take 1 tablet (0.1 mg total) by mouth 3 (three) times daily.  Dispense: 30 tablet; Refill: 2  6. Benign essential HTN  - Olmesartan-Amlodipine-HCTZ 40-5-25 MG TABS; Take 1 tablet by mouth daily.  Dispense: 30 tablet; Refill: 2  7. Adult BMI 30+  Doing great, continue the hard work  - Naltrexone-Bupropion HCl ER (CONTRAVE) 8-90 MG TB12; Take 2 tablets by mouth 2 (two) times daily.  Dispense: 120 tablet; Refill: 2  8. Hot flashes  - cloNIDine (CATAPRES) 0.1 MG tablet; Take 1 tablet (0.1 mg total) by mouth 3 (three) times daily.  Dispense: 30 tablet; Refill: 2  9. Tinea pedis of right foot  - ketoconazole (NIZORAL) 2 % cream; Apply 1 application topically daily.  Dispense: 60 g; Refill: 0  10. Numbness in feet  She does not want any further evaluation at this time

## 2016-06-21 LAB — COMPLETE METABOLIC PANEL WITH GFR
ALBUMIN: 4.5 g/dL (ref 3.6–5.1)
ALK PHOS: 48 U/L (ref 33–130)
ALT: 17 U/L (ref 6–29)
AST: 16 U/L (ref 10–35)
BUN: 13 mg/dL (ref 7–25)
CHLORIDE: 101 mmol/L (ref 98–110)
CO2: 30 mmol/L (ref 20–31)
Calcium: 10.3 mg/dL (ref 8.6–10.4)
Creat: 1.19 mg/dL — ABNORMAL HIGH (ref 0.50–1.05)
GFR, EST NON AFRICAN AMERICAN: 50 mL/min — AB (ref >=60)
GFR, Est African American: 58 mL/min — ABNORMAL LOW (ref >=60)
GLUCOSE: 94 mg/dL (ref 65–99)
POTASSIUM: 4 mmol/L (ref 3.5–5.3)
SODIUM: 141 mmol/L (ref 135–146)
Total Bilirubin: 0.6 mg/dL (ref 0.2–1.2)
Total Protein: 7.1 g/dL (ref 6.1–8.1)

## 2016-06-21 LAB — LIPID PANEL
CHOL/HDL RATIO: 2.7 ratio (ref <=5.0)
Cholesterol: 160 mg/dL (ref 125–200)
HDL: 60 mg/dL (ref >=46)
LDL CALC: 86 mg/dL (ref <130)
Triglycerides: 72 mg/dL (ref <150)
VLDL: 14 mg/dL (ref <30)

## 2016-06-22 ENCOUNTER — Encounter: Payer: Self-pay | Admitting: Family Medicine

## 2016-06-22 DIAGNOSIS — N181 Chronic kidney disease, stage 1: Secondary | ICD-10-CM | POA: Insufficient documentation

## 2016-07-11 ENCOUNTER — Other Ambulatory Visit: Payer: Self-pay | Admitting: Family Medicine

## 2016-07-11 DIAGNOSIS — E1121 Type 2 diabetes mellitus with diabetic nephropathy: Secondary | ICD-10-CM

## 2016-07-11 NOTE — Telephone Encounter (Signed)
Patient requesting refill of Xigduo be sent to [facility] [phone].

## 2016-09-25 ENCOUNTER — Ambulatory Visit (INDEPENDENT_AMBULATORY_CARE_PROVIDER_SITE_OTHER): Payer: BLUE CROSS/BLUE SHIELD | Admitting: Family Medicine

## 2016-09-25 ENCOUNTER — Encounter: Payer: Self-pay | Admitting: Family Medicine

## 2016-09-25 VITALS — BP 144/78 | HR 94 | Temp 98.7°F | Resp 16 | Ht 65.0 in | Wt 212.4 lb

## 2016-09-25 DIAGNOSIS — G4733 Obstructive sleep apnea (adult) (pediatric): Secondary | ICD-10-CM

## 2016-09-25 DIAGNOSIS — I10 Essential (primary) hypertension: Secondary | ICD-10-CM | POA: Diagnosis not present

## 2016-09-25 DIAGNOSIS — E1121 Type 2 diabetes mellitus with diabetic nephropathy: Secondary | ICD-10-CM

## 2016-09-25 DIAGNOSIS — E039 Hypothyroidism, unspecified: Secondary | ICD-10-CM

## 2016-09-25 DIAGNOSIS — B9789 Other viral agents as the cause of diseases classified elsewhere: Secondary | ICD-10-CM

## 2016-09-25 DIAGNOSIS — E6609 Other obesity due to excess calories: Secondary | ICD-10-CM

## 2016-09-25 DIAGNOSIS — Z23 Encounter for immunization: Secondary | ICD-10-CM

## 2016-09-25 DIAGNOSIS — J069 Acute upper respiratory infection, unspecified: Secondary | ICD-10-CM | POA: Diagnosis not present

## 2016-09-25 DIAGNOSIS — E785 Hyperlipidemia, unspecified: Secondary | ICD-10-CM | POA: Diagnosis not present

## 2016-09-25 LAB — POCT GLYCOSYLATED HEMOGLOBIN (HGB A1C): Hemoglobin A1C: 6.2

## 2016-09-25 MED ORDER — INSULIN PEN NEEDLE 32G X 6 MM MISC: 1.0000 | Freq: Every day | 2 refills | Status: DC

## 2016-09-25 MED ORDER — ROSUVASTATIN CALCIUM 5 MG PO TABS: 5.0000 mg | ORAL_TABLET | Freq: Every day | ORAL | 2 refills | Status: DC

## 2016-09-25 MED ORDER — DAPAGLIFLOZIN PROPANEDIOL 10 MG PO TABS: 10.0000 mg | ORAL_TABLET | Freq: Every day | ORAL | 2 refills | Status: DC

## 2016-09-25 MED ORDER — OLMESARTAN-AMLODIPINE-HCTZ 40-5-25 MG PO TABS: 1.0000 | ORAL_TABLET | Freq: Every day | ORAL | 2 refills | Status: DC

## 2016-09-25 MED ORDER — LIRAGLUTIDE -WEIGHT MANAGEMENT 18 MG/3ML ~~LOC~~ SOPN: 0.6000 mg | PEN_INJECTOR | Freq: Every day | SUBCUTANEOUS | 2 refills | Status: DC

## 2016-09-25 NOTE — Progress Notes (Signed)
Name: Andrea Randolph   MRN: [id number]    DOB: 1956-08-18   Date:09/25/2016       Progress Note  Subjective  Chief Complaint  Chief Complaint  Patient presents with  . Hypertension    3 month follow up  . Diabetes  . Flu Vaccine    HPI  DM II: she is taking medication as directed, denies side effects, hgbA1C was down to 5.9% , up to 6.3% , 6.0% and today is up to 6.2%. She is currently on Xigduo, however we will change to [place] plus Saxenda since Contrave is no longer curbing her appetite. Urine micro was 50 in 2016 but  back to normal at 02 Jan 2016. She denies polyphagia, polydipsia, but has polyuria - intermittently.  She is not checking fsbs at home. She is taking a daily aspirin daily, Crestor every other day. Eye exam is up to date. She is on ARB. She denies hypoglycemic episodes.   HTN: she has been compliant with medication and denies side effects of medication. No chest pain or palpitation. She is exercising at least 5 times weekly she no longer has noticed any dizziness. BP slightly up today.   Hyperlipidemia: she is taking Crestor 5 mg ( down from 20 because of myalgia ), she is only taking every other day and LDL is at goal   OSA: she has not been compliant with CPAP machine, she states she looks at the mask every night but she is not placing it on her face. Explained the importance of compliance. Discussed risk of strokes and heart attacks.   Obesity: she states she has been going to [place] classed 3 times weekly, also line dancing, and walking, she is going to her gym at least 5 times weekly.  She has health coach at work, she states she has been eating more fruit, and avoiding carbohydrates, eating lean meat. She started Qsymia in July 2016 and lost weight, but prescription was too expensive. She was switched to Contrave 08/2015, her weight was 224 lbs. She has lost only 16 lbs since, but gained almost 4 lbs since last visit and would like to try a new medication.  Discussed Saxenda and she would like to try it. There is no family history of thyroid cancer and no personal history of pancreatitis.   URI: she noticed mild rhinorrhea and nasal congestion, mild sore throat, over the past couple of days. No fever or chills.   Patient Active Problem List   Diagnosis Date Noted  . Chronic kidney disease, stage III (moderate) 06/22/2016  . Paresthesia of arm 03/20/2016  . Uterine fibroid 01/11/2016  . PMB (postmenopausal bleeding) 01/03/2016  . Allergic rhinitis, seasonal 05/24/2015  . Benign essential HTN 05/24/2015  . Undiagnosed cardiac murmurs 05/24/2015  . Chronic constipation 05/24/2015  . Diabetes mellitus with renal manifestation ([place]) 05/24/2015  . Dyslipidemia 05/24/2015  . Goiter 05/24/2015  . Acquired hypothyroidism 05/24/2015  . Mixed incontinence 05/24/2015  . Microalbuminuria 05/24/2015  . Iron deficiency anemia 05/24/2015  . Menopause 05/24/2015  . Obstructive apnea 05/24/2015  . Adult BMI 30+ 05/24/2015  . Hypertensive retinopathy 05/24/2015  . Vitamin D deficiency 05/24/2015  . History of shoulder surgery 03/11/2015    Past Surgical History:  Procedure Laterality Date  . BREAST BIOPSY Right 90's   benign core needle   . FOOT SURGERY    . TOTAL SHOULDER REPLACEMENT Right 01/25/2015   Dr. Roland Rack    Family History  Problem Relation Age of Onset  .  Heart disease Mother   . Heart disease Father   . Kidney disease Sister   . Hypertension Sister   . Diabetes Sister   . Hypertension Brother   . CVA Brother   . Breast cancer Maternal Aunt 66  . Ovarian cancer Neg Hx   . Colon cancer Neg Hx     Social History   Social History  . Marital status: Married    Spouse name: N/A  . Number of children: N/A  . Years of education: N/A   Occupational History  . Not on file.   Social History Main Topics  . Smoking status: Never Smoker  . Smokeless tobacco: Never Used  . Alcohol use No  . Drug use: No  . Sexual activity: Not  Currently    Birth control/ protection: Post-menopausal   Other Topics Concern  . Not on file   Social History Narrative  . No narrative on file     Current Outpatient Prescriptions:  .  ammonium lactate (LAC-HYDRIN) 12 % cream, Apply topically as needed for dry skin., Disp: 385 g, Rfl: 0 .  aspirin 81 MG tablet, Take by mouth., Disp: , Rfl:  .  Coenzyme Q10 (CO Q 10) 100 MG CAPS, Take 1 tablet by mouth daily. (Patient not taking: Reported on 06/20/2016), Disp: 30 capsule, Rfl: 0 .  dapagliflozin propanediol (FARXIGA) 10 MG TABS tablet, Take 10 mg by mouth daily., Disp: 30 tablet, Rfl: 2 .  glucose blood (FREESTYLE LITE) test strip, FREESTYLE LITE TEST (In Vitro Strip)  use as directed for 0 days  Quantity: 200.00;  Refills: 3   Ordered :25-Nov-2014  Vonna Kotyk ;  Started 30-Dec-2008 Active Comments: DX: 250.00, Disp: , Rfl:  .  ketoconazole (NIZORAL) 2 % cream, Apply 1 application topically daily., Disp: 60 g, Rfl: 0 .  levothyroxine (SYNTHROID, LEVOTHROID) 88 MCG tablet, Take 1 tablet (88 mcg total) by mouth daily., Disp: 30 tablet, Rfl: 0 .  Liraglutide -Weight Management (SAXENDA) 18 MG/3ML SOPN, Inject 0.6-3 mg into the skin daily., Disp: 9 mL, Rfl: 2 .  Multiple Vitamin (MULTIVITAMIN) tablet, Take 1 tablet by mouth daily., Disp: , Rfl:  .  Olmesartan-Amlodipine-HCTZ 40-5-25 MG TABS, Take 1 tablet by mouth daily., Disp: 30 tablet, Rfl: 2 .  rosuvastatin (CRESTOR) 5 MG tablet, Take 1 tablet (5 mg total) by mouth daily., Disp: 30 tablet, Rfl: 2  No Known Allergies   ROS  Constitutional: Negative for fever, positive weight change.  Respiratory: Negative for cough and shortness of breath.   Cardiovascular: Negative for chest pain or palpitations.  Gastrointestinal: Negative for abdominal pain, no bowel changes.  Musculoskeletal: Negative for gait problem or joint swelling.  Skin: Negative for rash.  Neurological: Negative for dizziness or headache.  No other specific  complaints in a complete review of systems (except as listed in HPI above).  Objective  Vitals:   09/25/16 1429  BP: (!) 144/78  Pulse: 94  Resp: 16  Temp: 98.7 F (37.1 C)  SpO2: 97%  Weight: 212 lb 7 oz (96.4 kg)  Height: 5\' 5"  (1.651 m)    Body mass index is 35.35 kg/m.  Physical Exam  Constitutional: Patient appears well-developed and well-nourished. Obese  No distress.  HEENT: head atraumatic, normocephalic, pupils equal and reactive to light, ears normal TM bilaterally, neck supple, throat within normal limits Cardiovascular: Normal rate, regular rhythm and normal heart sounds.  No murmur heard. No BLE edema. Pulmonary/Chest: Effort normal and breath sounds normal. No respiratory  distress. Abdominal: Soft.  There is no tenderness. Psychiatric: Patient has a normal mood and affect. behavior is normal. Judgment and thought content normal.  Recent Results (from the past 2160 hour(s))  POCT HgB A1C     Status: None   Collection Time: 09/25/16  2:53 PM  Result Value Ref Range   Hemoglobin A1C 6.2     PHQ2/9: Depression screen [facility] 2/9 09/25/2016 03/20/2016 11/16/2015 08/29/2015 05/27/2015  Decreased Interest 0 0 0 0 0  Down, Depressed, Hopeless 0 0 0 0 0  PHQ - 2 Score 0 0 0 0 0     Fall Risk: Fall Risk  09/25/2016 03/20/2016 11/16/2015 08/29/2015 05/27/2015  Falls in the past year? No No No No No     Functional Status Survey: Is the patient deaf or have difficulty hearing?: No Does the patient have difficulty seeing, even when wearing glasses/contacts?: No Does the patient have difficulty concentrating, remembering, or making decisions?: No Does the patient have difficulty walking or climbing stairs?: No Does the patient have difficulty dressing or bathing?: No Does the patient have difficulty doing errands alone such as visiting a doctor's office or shopping?: No    Assessment & Plan  1. Type 2 diabetes mellitus with diabetic nephropathy, without long-term current  use of insulin (HCC)  - POCT HgB A1C - dapagliflozin propanediol (FARXIGA) 10 MG TABS tablet; Take 10 mg by mouth daily.  Dispense: 30 tablet; Refill: 2  2. Need for influenza vaccination  - Flu Vaccine QUAD 36+ mos PF IM (Fluarix & Fluzone Quad PF)  3. Acquired hypothyroidism  Continue follow up with endocrinologist  4. Dyslipidemia  - rosuvastatin (CRESTOR) 5 MG tablet; Take 1 tablet (5 mg total) by mouth daily.  Dispense: 30 tablet; Refill: 2  5. Obstructive apnea  Needs to resume CPAP   6. Benign essential HTN  - Olmesartan-Amlodipine-HCTZ 40-5-25 MG TABS; Take 1 tablet by mouth daily.  Dispense: 30 tablet; Refill: 2  7. Viral upper respiratory tract infection  Advised saline spray, rest and coricidin HBP  8. Class 1 obesity due to excess calories with serious comorbidity in adult, unspecified BMI  Discussed possible side effects - Liraglutide -Weight Management (SAXENDA) 18 MG/3ML SOPN; Inject 0.6-3 mg into the skin daily.  Dispense: 9 mL; Refill: 2

## 2016-10-30 ENCOUNTER — Other Ambulatory Visit: Payer: Self-pay | Admitting: Family Medicine

## 2016-10-30 DIAGNOSIS — Z1231 Encounter for screening mammogram for malignant neoplasm of breast: Secondary | ICD-10-CM

## 2016-11-15 DIAGNOSIS — E119 Type 2 diabetes mellitus without complications: Secondary | ICD-10-CM | POA: Diagnosis not present

## 2016-11-15 LAB — HM DIABETES EYE EXAM

## 2016-11-16 ENCOUNTER — Encounter: Payer: Self-pay | Admitting: Family Medicine

## 2016-11-30 ENCOUNTER — Other Ambulatory Visit: Payer: Self-pay

## 2016-11-30 MED ORDER — EMPAGLIFLOZIN 10 MG PO TABS: 10.0000 mg | ORAL_TABLET | Freq: Every day | ORAL | 2 refills | Status: DC

## 2016-12-10 ENCOUNTER — Ambulatory Visit
Admission: RE | Admit: 2016-12-10 | Discharge: 2016-12-10 | Disposition: A | Discharge status: Home / Self Care | Payer: BLUE CROSS/BLUE SHIELD | Source: Ambulatory Visit | Attending: Family Medicine | Admitting: Family Medicine

## 2016-12-10 DIAGNOSIS — Z1231 Encounter for screening mammogram for malignant neoplasm of breast: Secondary | ICD-10-CM | POA: Insufficient documentation

## 2016-12-28 ENCOUNTER — Encounter: Payer: Self-pay | Admitting: Family Medicine

## 2016-12-28 ENCOUNTER — Ambulatory Visit (INDEPENDENT_AMBULATORY_CARE_PROVIDER_SITE_OTHER): Payer: BLUE CROSS/BLUE SHIELD | Admitting: Family Medicine

## 2016-12-28 VITALS — BP 122/64 | HR 69 | Temp 98.4°F | Resp 16 | Ht 66.3 in | Wt 216.1 lb

## 2016-12-28 DIAGNOSIS — Z01419 Encounter for gynecological examination (general) (routine) without abnormal findings: Secondary | ICD-10-CM

## 2016-12-28 DIAGNOSIS — E1121 Type 2 diabetes mellitus with diabetic nephropathy: Secondary | ICD-10-CM | POA: Diagnosis not present

## 2016-12-28 LAB — CBC WITH DIFFERENTIAL/PLATELET
BASOS ABS: 0 {cells}/uL (ref 0–200)
Basophils Relative: 0 %
EOS ABS: 120 {cells}/uL (ref 15–500)
Eosinophils Relative: 3 %
HCT: 36.5 % (ref 35.0–45.0)
HEMOGLOBIN: 11.8 g/dL (ref 11.7–15.5)
LYMPHS ABS: 2040 {cells}/uL (ref 850–3900)
Lymphocytes Relative: 51 %
MCH: 26.5 pg — AB (ref 27.0–33.0)
MCHC: 32.3 g/dL (ref 32.0–36.0)
MCV: 81.8 fL (ref 80.0–100.0)
MPV: 8.8 fL (ref 7.5–12.5)
Monocytes Absolute: 240 cells/uL (ref 200–950)
Monocytes Relative: 6 %
NEUTROS ABS: 1600 {cells}/uL (ref 1500–7800)
NEUTROS PCT: 40 %
Platelets: 331 10*3/uL (ref 140–400)
RBC: 4.46 MIL/uL (ref 3.80–5.10)
RDW: 15.4 % — ABNORMAL HIGH (ref 11.0–15.0)
WBC: 4 10*3/uL (ref 3.8–10.8)

## 2016-12-28 MED ORDER — EMPAGLIFLOZIN 10 MG PO TABS: 10.0000 mg | ORAL_TABLET | Freq: Every day | ORAL | 2 refills | Status: DC

## 2016-12-28 NOTE — Addendum Note (Signed)
Addended by: Inda Coke on: 12/28/2016 12:01 PM   Modules accepted: Orders

## 2016-12-28 NOTE — Progress Notes (Signed)
Name: Andrea Randolph   MRN: [id number]    DOB: 20-Jan-1956   Date:12/28/2016       Progress Note  Subjective  Chief Complaint  Chief Complaint  Patient presents with  . Annual Exam    pt has not been on diabetic medictions    HPI  Well Woman: she had an endometrium biopsy 01/2016 by Dr. Enzo Bi, no longer having post-menopausal bleeding. She has lack of libido. She has mild symptoms of stress incontinence. Discussed Kegel exercises, she does not want to see PT at this time. No change in bowel movements - still constipated. Out DM medication because of insurance problems, stopped covering [place] and pharmacy stated did not get a replacement even though we sent medication, we will give her a printed rx.    Patient Active Problem List   Diagnosis Date Noted  . Chronic kidney disease, stage III (moderate) 06/22/2016  . Paresthesia of arm 03/20/2016  . Uterine fibroid 01/11/2016  . PMB (postmenopausal bleeding) 01/03/2016  . Allergic rhinitis, seasonal 05/24/2015  . Benign essential HTN 05/24/2015  . Undiagnosed cardiac murmurs 05/24/2015  . Chronic constipation 05/24/2015  . Diabetes mellitus with renal manifestation ([place]) 05/24/2015  . Dyslipidemia 05/24/2015  . Goiter 05/24/2015  . Acquired hypothyroidism 05/24/2015  . Mixed incontinence 05/24/2015  . Microalbuminuria 05/24/2015  . Iron deficiency anemia 05/24/2015  . Menopause 05/24/2015  . Obstructive apnea 05/24/2015  . Adult BMI 30+ 05/24/2015  . Hypertensive retinopathy 05/24/2015  . Vitamin D deficiency 05/24/2015  . Status post total replacement of right shoulder 03/11/2015    Past Surgical History:  Procedure Laterality Date  . BREAST BIOPSY Right 90's   benign core needle   . FOOT SURGERY    . TOTAL SHOULDER REPLACEMENT Right 01/25/2015   Dr. Roland Rack    Family History  Problem Relation Age of Onset  . Heart disease Mother   . Heart disease Father   . Kidney disease Sister   . Hypertension Sister   .  Diabetes Sister   . Hypertension Brother   . CVA Brother   . Breast cancer Maternal Aunt 81  . Ovarian cancer Neg Hx   . Colon cancer Neg Hx     Social History   Social History  . Marital status: Married    Spouse name: N/A  . Number of children: N/A  . Years of education: N/A   Occupational History  . Not on file.   Social History Main Topics  . Smoking status: Never Smoker  . Smokeless tobacco: Never Used  . Alcohol use No  . Drug use: No  . Sexual activity: Not Currently    Birth control/ protection: Post-menopausal   Other Topics Concern  . Not on file   Social History Narrative  . No narrative on file     Current Outpatient Prescriptions:  .  ammonium lactate (LAC-HYDRIN) 12 % cream, Apply topically as needed for dry skin., Disp: 385 g, Rfl: 0 .  aspirin 81 MG tablet, Take by mouth., Disp: , Rfl:  .  Coenzyme Q10 (CO Q 10) 100 MG CAPS, Take 1 tablet by mouth daily. (Patient not taking: Reported on 06/20/2016), Disp: 30 capsule, Rfl: 0 .  empagliflozin (JARDIANCE) 10 MG TABS tablet, Take 10 mg by mouth daily. In place of Farxiga, per insurance request, Disp: 30 tablet, Rfl: 2 .  glucose blood (FREESTYLE LITE) test strip, FREESTYLE LITE TEST (In Vitro Strip)  use as directed for 0 days  Quantity: 200.00;  Refills: 3   Ordered :25-Nov-2014  Vonna Kotyk ;  Started 30-Dec-2008 Active Comments: DX: 250.00, Disp: , Rfl:  .  Insulin Pen Needle (NOVOFINE) 32G X 6 MM MISC, 1 each by Does not apply route daily., Disp: 100 each, Rfl: 2 .  ketoconazole (NIZORAL) 2 % cream, Apply 1 application topically daily., Disp: 60 g, Rfl: 0 .  levothyroxine (SYNTHROID, LEVOTHROID) 88 MCG tablet, Take 1 tablet (88 mcg total) by mouth daily., Disp: 30 tablet, Rfl: 0 .  Liraglutide -Weight Management (SAXENDA) 18 MG/3ML SOPN, Inject 0.6-3 mg into the skin daily. (Patient not taking: Reported on 12/28/2016), Disp: 15 mL, Rfl: 2 .  Multiple Vitamin (MULTIVITAMIN) tablet, Take 1 tablet by mouth  daily., Disp: , Rfl:  .  Olmesartan-Amlodipine-HCTZ 40-5-25 MG TABS, Take 1 tablet by mouth daily., Disp: 30 tablet, Rfl: 2 .  rosuvastatin (CRESTOR) 5 MG tablet, Take 1 tablet (5 mg total) by mouth daily., Disp: 30 tablet, Rfl: 2  No Known Allergies   ROS  Constitutional: Negative for fever, positive for  weight change - Saxenda did not work, she gained weight  Respiratory: Negative for cough and shortness of breath.   Cardiovascular: Negative for chest pain or palpitations.  Gastrointestinal: Negative for abdominal pain, no bowel changes.  Musculoskeletal: Negative for gait problem or joint swelling.  Skin: Negative for rash.  Neurological: Negative for dizziness or headache.  No other specific complaints in a complete review of systems (except as listed in HPI above).  Objective  Vitals:   12/28/16 1104  BP: 122/64  Pulse: 69  Resp: 16  Temp: 98.4 F (36.9 C)  SpO2: 98%  Weight: 216 lb 2 oz (98 kg)  Height: 5' 6.3" (1.684 m)    Body mass index is 34.57 kg/m.  Physical Exam  Constitutional: Patient appears well-developed and obese No distress.  HENT: Head: Normocephalic and atraumatic. Ears: B TMs ok, no erythema or effusion; Nose: Nose normal. Mouth/Throat: Oropharynx is clear and moist. No oropharyngeal exudate. Thyroid goiter, large on isthmus right side - sees Dr. Gabriel Carina  Eyes: Conjunctivae and EOM are normal. Pupils are equal, round, and reactive to light. No scleral icterus.  Neck: Normal range of motion. Neck supple. No JVD present. No thyromegaly present.  Cardiovascular: Normal rate, regular rhythm and normal heart sounds.  No murmur heard. No BLE edema. Pulmonary/Chest: Effort normal and breath sounds normal. No respiratory distress. Abdominal: Soft. Bowel sounds are normal, no distension. There is no tenderness. no masses Breast: no lumps or masses, no nipple discharge or rashes FEMALE GENITALIA:  Not done RECTAL: not done Musculoskeletal: Normal range of  motion, no joint effusions. No gross deformities Neurological: he is alert and oriented to person, place, and time. No cranial nerve deficit. Coordination, balance, strength, speech and gait are normal.  Skin: Skin is warm and dry. No rash noted. No erythema.  Psychiatric: Patient has a normal mood and affect. behavior is normal. Judgment and thought content normal.  Recent Results (from the past 2160 hour(s))  HM DIABETES EYE EXAM     Status: None   Collection Time: 11/15/16 12:00 AM  Result Value Ref Range   HM Diabetic Eye Exam No Retinopathy No Retinopathy    Comment: [facility], Dr. Joan Flores     PHQ2/9: Depression screen [facility] 2/9 12/28/2016 09/25/2016 03/20/2016 11/16/2015 08/29/2015  Decreased Interest 0 0 0 0 0  Down, Depressed, Hopeless 0 0 0 0 0  PHQ - 2 Score 0 0 0 0 0  Fall Risk: Fall Risk  12/28/2016 09/25/2016 03/20/2016 11/16/2015 08/29/2015  Falls in the past year? No No No No No    Functional Status Survey: Is the patient deaf or have difficulty hearing?: No Does the patient have difficulty seeing, even when wearing glasses/contacts?: No Does the patient have difficulty concentrating, remembering, or making decisions?: No Does the patient have difficulty walking or climbing stairs?: No Does the patient have difficulty dressing or bathing?: No Does the patient have difficulty doing errands alone such as visiting a doctor's office or shopping?: No  Current Exercise Habits: Structured exercise class, Type of exercise: treadmill, Frequency (Times/Week): 5, Intensity: Moderate     Assessment & Plan  1. Well woman exam  Discussed importance of 150 minutes of physical activity weekly, eat two servings of fish weekly, eat one serving of tree nuts ( cashews, pistachios, pecans, almonds.Marland Kitchen) every other day, eat 6 servings of fruit/vegetables daily and drink plenty of water and avoid sweet beverages.   She will return for regular follow up  - COMPLETE METABOLIC PANEL  WITH GFR - Insulin, fasting - Hemoglobin A1c - Lipid panel - TSH - VITAMIN D 25 Hydroxy (Vit-D Deficiency, Fractures) - Vitamin B12 - CBC with Differential/Platelet

## 2016-12-29 LAB — COMPLETE METABOLIC PANEL WITH GFR
ALBUMIN: 4 g/dL (ref 3.6–5.1)
ALK PHOS: 41 U/L (ref 33–130)
ALT: 17 U/L (ref 6–29)
AST: 18 U/L (ref 10–35)
BUN: 16 mg/dL (ref 7–25)
CO2: 28 mmol/L (ref 20–31)
CREATININE: 1.1 mg/dL — AB (ref 0.50–1.05)
Calcium: 9.7 mg/dL (ref 8.6–10.4)
Chloride: 105 mmol/L (ref 98–110)
GFR, Est African American: 64 mL/min (ref >=60)
GFR, Est Non African American: 55 mL/min — ABNORMAL LOW (ref >=60)
GLUCOSE: 85 mg/dL (ref 65–99)
Potassium: 3.8 mmol/L (ref 3.5–5.3)
SODIUM: 141 mmol/L (ref 135–146)
TOTAL PROTEIN: 6.6 g/dL (ref 6.1–8.1)
Total Bilirubin: 0.6 mg/dL (ref 0.2–1.2)

## 2016-12-29 LAB — LIPID PANEL
CHOL/HDL RATIO: 3.3 ratio (ref <5.0)
Cholesterol: 156 mg/dL (ref <200)
HDL: 48 mg/dL — ABNORMAL LOW (ref >50)
LDL CALC: 99 mg/dL (ref <100)
TRIGLYCERIDES: 47 mg/dL (ref <150)
VLDL: 9 mg/dL (ref <30)

## 2016-12-29 LAB — VITAMIN B12: Vitamin B-12: 426 pg/mL (ref 200–1100)

## 2016-12-29 LAB — TSH: TSH: 0.36 mIU/L — ABNORMAL LOW

## 2016-12-29 LAB — HEMOGLOBIN A1C
Hgb A1c MFr Bld: 6 % — ABNORMAL HIGH (ref <5.7)
Mean Plasma Glucose: 126 mg/dL

## 2016-12-29 LAB — INSULIN, FASTING: INSULIN FASTING, SERUM: 4.1 u[IU]/mL (ref 2.0–19.6)

## 2016-12-29 LAB — VITAMIN D 25 HYDROXY (VIT D DEFICIENCY, FRACTURES): VIT D 25 HYDROXY: 25 ng/mL — AB (ref 30–100)

## 2017-01-07 ENCOUNTER — Other Ambulatory Visit: Payer: Self-pay | Admitting: Family Medicine

## 2017-01-14 ENCOUNTER — Ambulatory Visit (INDEPENDENT_AMBULATORY_CARE_PROVIDER_SITE_OTHER): Payer: BLUE CROSS/BLUE SHIELD | Admitting: Family Medicine

## 2017-01-14 ENCOUNTER — Encounter: Payer: Self-pay | Admitting: Family Medicine

## 2017-01-14 VITALS — BP 124/70 | HR 64 | Temp 98.5°F | Resp 16 | Ht 66.0 in | Wt 216.5 lb

## 2017-01-14 DIAGNOSIS — E039 Hypothyroidism, unspecified: Secondary | ICD-10-CM

## 2017-01-14 DIAGNOSIS — R809 Proteinuria, unspecified: Secondary | ICD-10-CM | POA: Diagnosis not present

## 2017-01-14 DIAGNOSIS — H35039 Hypertensive retinopathy, unspecified eye: Secondary | ICD-10-CM

## 2017-01-14 DIAGNOSIS — I10 Essential (primary) hypertension: Secondary | ICD-10-CM

## 2017-01-14 DIAGNOSIS — K5909 Other constipation: Secondary | ICD-10-CM

## 2017-01-14 DIAGNOSIS — G4733 Obstructive sleep apnea (adult) (pediatric): Secondary | ICD-10-CM | POA: Diagnosis not present

## 2017-01-14 DIAGNOSIS — E1121 Type 2 diabetes mellitus with diabetic nephropathy: Secondary | ICD-10-CM

## 2017-01-14 DIAGNOSIS — E6609 Other obesity due to excess calories: Secondary | ICD-10-CM

## 2017-01-14 DIAGNOSIS — E785 Hyperlipidemia, unspecified: Secondary | ICD-10-CM

## 2017-01-14 DIAGNOSIS — G4700 Insomnia, unspecified: Secondary | ICD-10-CM

## 2017-01-14 MED ORDER — LORCASERIN HCL ER 20 MG PO TB24: 1.0000 | ORAL_TABLET | Freq: Every day | ORAL | 2 refills | Status: DC

## 2017-01-14 MED ORDER — EMPAGLIFLOZIN 10 MG PO TABS: 10.0000 mg | ORAL_TABLET | Freq: Every day | ORAL | 1 refills | Status: DC

## 2017-01-14 MED ORDER — LUBIPROSTONE 24 MCG PO CAPS: 24.0000 ug | ORAL_CAPSULE | Freq: Two times a day (BID) | ORAL | 2 refills | Status: DC

## 2017-01-14 MED ORDER — OLMESARTAN-AMLODIPINE-HCTZ 40-5-25 MG PO TABS
1.0000 | ORAL_TABLET | Freq: Every day | ORAL | 1 refills | Status: DC
Start: 2017-01-14 — End: 2017-09-19

## 2017-01-14 MED ORDER — ROSUVASTATIN CALCIUM 5 MG PO TABS: 5.0000 mg | ORAL_TABLET | Freq: Every day | ORAL | 1 refills | Status: DC

## 2017-01-14 NOTE — Progress Notes (Signed)
Name: Andrea Randolph   MRN: [id number]    DOB: 24-Jul-1956   Date:01/14/2017       Progress Note  Subjective  Chief Complaint  Chief Complaint  Patient presents with  . Follow-up    patient is here for a 3 month f/u  . Hyperlipidemia  . Hypertension  . Diabetes  . Obesity  . Sleep Apnea    HPI  DM II: she is taking medication as directed - on Jardiance, denies side effects, hgbA1C was down to 5.9% , up to 6.3% , 6.0% , 6.2% and down again to 6.0%. Marland Kitchen Urine micro was 50 in 2016 but back to normal at 02 Jan 2016. She denies polyphagia, polydipsia, but has polyuria - intermittently.  She is checking fsbs at home and is usually around 120's. She is taking a daily aspirin daily, Crestor every other day. Eye exam is up to date. She is on ARB. She denies hypoglycemic episodes.   HTN: she has been compliant with medication and denies side effects of medication. No chest pain or palpitation. She is exercising at least 5 times weekly she no longer has noticed any dizziness. BP slightly up today.   Hyperlipidemia: she is taking Crestor 5 mg ( down from 20 because of myalgia ), she is only taking every other day and LDL is at goal   OSA: she has not been compliant with CPAP machine, she states she looks at the mask every night but she is not placing it on her face. Explained the importance of compliance. Discussed risk of strokes and heart attacks. She states hot flashes is not helping  Obesity: she states she has been going to [place] class[employer] 3 times weekly, also line dancing, and walking, she is going to her gym at least 5 times weekly. She has health coach at work, she states she has been eating more fruit, and avoiding carbohydrates, eating lean meat. She started Qsymia in July 2016 and lost weight, but prescription was too expensive. She was switched to Contrave 08/2015, her weight was 224 lbs and lost 16 lbs but it stopped working. Tried Saxenda but it made her gain weight. She would like  to try something else, so we will try Belviq  Chronic constipation: for many years, tried Metamucil otc without help, discussed options and we will try Amitiza. Bowel movements about 3 times a week, she has to strain but no blood in stools.   Patient Active Problem List   Diagnosis Date Noted  . Chronic kidney disease, stage III (moderate) 06/22/2016  . Paresthesia of arm 03/20/2016  . Uterine fibroid 01/11/2016  . PMB (postmenopausal bleeding) 01/03/2016  . Allergic rhinitis, seasonal 05/24/2015  . Benign essential HTN 05/24/2015  . Undiagnosed cardiac murmurs 05/24/2015  . Chronic constipation 05/24/2015  . Diabetes mellitus with renal manifestation ([place]) 05/24/2015  . Dyslipidemia 05/24/2015  . Goiter 05/24/2015  . Acquired hypothyroidism 05/24/2015  . Mixed incontinence 05/24/2015  . Microalbuminuria 05/24/2015  . Iron deficiency anemia 05/24/2015  . Menopause 05/24/2015  . Obstructive apnea 05/24/2015  . Adult BMI 30+ 05/24/2015  . Hypertensive retinopathy 05/24/2015  . Vitamin D deficiency 05/24/2015  . Status post total replacement of right shoulder 03/11/2015    Past Surgical History:  Procedure Laterality Date  . BREAST BIOPSY Right 90's   benign core needle   . FOOT SURGERY    . TOTAL SHOULDER REPLACEMENT Right 01/25/2015   Dr. Roland Rack    Family History  Problem Relation Age of  Onset  . Heart disease Mother   . Heart disease Father   . Kidney disease Sister   . Hypertension Sister   . Diabetes Sister   . Hypertension Brother   . CVA Brother   . Breast cancer Maternal Aunt 22  . Ovarian cancer Neg Hx   . Colon cancer Neg Hx     Social History   Social History  . Marital status: Married    Spouse name: N/A  . Number of children: N/A  . Years of education: N/A   Occupational History  . Not on file.   Social History Main Topics  . Smoking status: Never Smoker  . Smokeless tobacco: Never Used  . Alcohol use No  . Drug use: No  . Sexual activity:  Not Currently    Birth control/ protection: Post-menopausal   Other Topics Concern  . Not on file   Social History Narrative  . No narrative on file     Current Outpatient Prescriptions:  .  ammonium lactate (LAC-HYDRIN) 12 % cream, Apply topically as needed for dry skin., Disp: 385 g, Rfl: 0 .  aspirin 81 MG tablet, Take by mouth., Disp: , Rfl:  .  empagliflozin (JARDIANCE) 10 MG TABS tablet, Take 10 mg by mouth daily. In place of Farxiga, per insurance request, Disp: 90 tablet, Rfl: 1 .  glucose blood (FREESTYLE LITE) test strip, FREESTYLE LITE TEST (In Vitro Strip)  use as directed for 0 days  Quantity: 200.00;  Refills: 3   Ordered :25-Nov-2014  Vonna Kotyk ;  Started 30-Dec-2008 Active Comments: DX: 250.00, Disp: , Rfl:  .  ketoconazole (NIZORAL) 2 % cream, Apply 1 application topically daily., Disp: 60 g, Rfl: 0 .  levothyroxine (SYNTHROID, LEVOTHROID) 88 MCG tablet, Take 1 tablet (88 mcg total) by mouth daily., Disp: 30 tablet, Rfl: 0 .  Multiple Vitamin (MULTIVITAMIN) tablet, Take 1 tablet by mouth daily., Disp: , Rfl:  .  Olmesartan-Amlodipine-HCTZ 40-5-25 MG TABS, Take 1 tablet by mouth daily., Disp: 90 tablet, Rfl: 1 .  rosuvastatin (CRESTOR) 5 MG tablet, Take 1 tablet (5 mg total) by mouth daily., Disp: 90 tablet, Rfl: 1 .  Lorcaserin HCl ER (BELVIQ XR) 20 MG TB24, Take 1 tablet by mouth daily., Disp: 30 tablet, Rfl: 2 .  lubiprostone (AMITIZA) 24 MCG capsule, Take 1 capsule (24 mcg total) by mouth 2 (two) times daily with a meal., Disp: 60 capsule, Rfl: 2  No Known Allergies   ROS  Constitutional: Negative for fever or weight change.  Respiratory: Negative for cough and shortness of breath.   Cardiovascular: Negative for chest pain or palpitations.  Gastrointestinal: Negative for abdominal pain, no bowel changes.  Musculoskeletal: Negative for gait problem or joint swelling.  Skin: Negative for rash.  Neurological: Negative for dizziness or headache.  No other  specific complaints in a complete review of systems (except as listed in HPI above).  Objective   Vitals:   01/14/17 0753 01/14/17 0804  BP:  124/70  Pulse: 64   Resp: 16   Temp: 98.5 F (36.9 C)   TempSrc: Oral   SpO2: 97%   Weight: 216 lb 8 oz (98.2 kg)   Height: 5' 6" (1.676 m)     Body mass index is 34.94 kg/m.  Physical Exam  Constitutional: Patient appears well-developed and well-nourished. Obese  No distress.  HEENT: head atraumatic, normocephalic, pupils equal and reactive to light, ears normal TM bilaterally, neck supple, throat within normal limits, large goiter worse  on right side  Cardiovascular: Normal rate, regular rhythm and normal heart sounds.  SEM 2/6  No BLE edema. Pulmonary/Chest: Effort normal and breath sounds normal. No respiratory distress. Abdominal: Soft.  There is no tenderness. Psychiatric: Patient has a normal mood and affect. behavior is normal. Judgment and thought content normal.  Recent Results (from the past 2160 hour(s))  HM DIABETES EYE EXAM     Status: None   Collection Time: 11/15/16 12:00 AM  Result Value Ref Range   HM Diabetic Eye Exam No Retinopathy No Retinopathy    Comment: [facility], Dr. Joan Flores  COMPLETE METABOLIC PANEL WITH GFR     Status: Abnormal   Collection Time: 12/28/16 12:09 PM  Result Value Ref Range   Sodium 141 135 - 146 mmol/L   Potassium 3.8 3.5 - 5.3 mmol/L   Chloride 105 98 - 110 mmol/L   CO2 28 20 - 31 mmol/L   Glucose, Bld 85 65 - 99 mg/dL   BUN 16 7 - 25 mg/dL   Creat 1.10 (H) 0.50 - 1.05 mg/dL    Comment:   For patients > or = 60 years of age: The upper reference limit for Creatinine is approximately 13% higher for people identified as African-American.      Total Bilirubin 0.6 0.2 - 1.2 mg/dL   Alkaline Phosphatase 41 33 - 130 U/L   AST 18 10 - 35 U/L   ALT 17 6 - 29 U/L   Total Protein 6.6 6.1 - 8.1 g/dL   Albumin 4.0 3.6 - 5.1 g/dL   Calcium 9.7 8.6 - 10.4 mg/dL   GFR, Est African  American 64 >=60 mL/min   GFR, Est Non African American 55 (L) >=60 mL/min  Insulin, fasting     Status: None   Collection Time: 12/28/16 12:09 PM  Result Value Ref Range   Insulin fasting, serum 4.1 2.0 - 19.6 uIU/mL    Comment:   This insulin assay shows strong cross-reactivity for some insulin analogs (lispro, aspart, and glargine) and much lower cross-reactivity with others (detemir, glulisine).   Stimulated Insulin reference intervals were established using the Siemens Immulite assay. These values are provided for general guidance only.   Hemoglobin A1c     Status: Abnormal   Collection Time: 12/28/16 12:09 PM  Result Value Ref Range   Hgb A1c MFr Bld 6.0 (H) <5.7 %    Comment:   For someone without known diabetes, a hemoglobin A1c value between 5.7% and 6.4% is consistent with prediabetes and should be confirmed with a follow-up test.   For someone with known diabetes, a value <7% indicates that their diabetes is well controlled. A1c targets should be individualized based on duration of diabetes, age, co-morbid conditions and other considerations.   This assay result is consistent with an increased risk of diabetes.   Currently, no consensus exists regarding use of hemoglobin A1c for diagnosis of diabetes in children.      Mean Plasma Glucose 126 mg/dL  Lipid panel     Status: Abnormal   Collection Time: 12/28/16 12:09 PM  Result Value Ref Range   Cholesterol 156 <200 mg/dL   Triglycerides 47 <150 mg/dL   HDL 48 (L) >50 mg/dL   Total CHOL/HDL Ratio 3.3 <5.0 Ratio   VLDL 9 <30 mg/dL   LDL Cholesterol 99 <100 mg/dL  TSH     Status: Abnormal   Collection Time: 12/28/16 12:09 PM  Result Value Ref Range   TSH 0.36 (L)  mIU/L    Comment:   Reference Range   > or = 20 Years  0.40-4.50   Pregnancy Range First trimester  0.26-2.66 Second trimester 0.55-2.73 Third trimester  0.43-2.91     VITAMIN D 25 Hydroxy (Vit-D Deficiency, Fractures)     Status: Abnormal    Collection Time: 12/28/16 12:09 PM  Result Value Ref Range   Vit D, 25-Hydroxy 25 (L) 30 - 100 ng/mL    Comment: Vitamin D Status           25-OH Vitamin D        Deficiency                <20 ng/mL        Insufficiency         20 - 29 ng/mL        Optimal             > or = 30 ng/mL   For 25-OH Vitamin D testing on patients on D2-supplementation and patients for whom quantitation of D2 and D3 fractions is required, the QuestAssureD 25-OH VIT D, (D2,D3), LC/MS/MS is recommended: order code [id number] (patients > 2 yrs).   Vitamin B12     Status: None   Collection Time: 12/28/16 12:09 PM  Result Value Ref Range   Vitamin B-12 426 200 - 1,100 pg/mL  CBC with Differential/Platelet     Status: Abnormal   Collection Time: 12/28/16 12:09 PM  Result Value Ref Range   WBC 4.0 3.8 - 10.8 K/uL   RBC 4.46 3.80 - 5.10 MIL/uL   Hemoglobin 11.8 11.7 - 15.5 g/dL   HCT 36.5 35.0 - 45.0 %   MCV 81.8 80.0 - 100.0 fL   MCH 26.5 (L) 27.0 - 33.0 pg   MCHC 32.3 32.0 - 36.0 g/dL   RDW 15.4 (H) 11.0 - 15.0 %   Platelets 331 140 - 400 K/uL   MPV 8.8 7.5 - 12.5 fL   Neutro Abs 1,600 1,500 - 7,800 cells/uL   Lymphs Abs 2,040 850 - 3,900 cells/uL   Monocytes Absolute 240 200 - 950 cells/uL   Eosinophils Absolute 120 15 - 500 cells/uL   Basophils Absolute 0 0 - 200 cells/uL   Neutrophils Relative % 40 %   Lymphocytes Relative 51 %   Monocytes Relative 6 %   Eosinophils Relative 3 %   Basophils Relative 0 %   Smear Review Criteria for review not met      PHQ2/9: Depression screen [facility] 2/9 12/28/2016 09/25/2016 03/20/2016 11/16/2015 08/29/2015  Decreased Interest 0 0 0 0 0  Down, Depressed, Hopeless 0 0 0 0 0  PHQ - 2 Score 0 0 0 0 0     Fall Risk: Fall Risk  12/28/2016 09/25/2016 03/20/2016 11/16/2015 08/29/2015  Falls in the past year? _0      Assessment & Plan  1. Type 2 diabetes mellitus with diabetic nephropathy, without long-term current use of insulin (HCC)  - empagliflozin  (JARDIANCE) 10 MG TABS tablet; Take 10 mg by mouth daily. In place of Farxiga, per insurance request  Dispense: 90 tablet; Refill: 1  2. Acquired hypothyroidism  We will fax results to Dr. Gabriel Carina   3. Class 1 obesity due to excess calories with serious comorbidity in adult, unspecified BMI  - Lorcaserin HCl ER (BELVIQ XR) 20 MG TB24; Take 1 tablet by mouth daily.  Dispense: 30 tablet; Refill: 2  4. Benign essential HTN  - Olmesartan-Amlodipine-HCTZ  40-5-25 MG TABS; Take 1 tablet by mouth daily.  Dispense: 90 tablet; Refill: 1  5. Obstructive apnea  Not wearing CPAP   6. Insomnia, persistent  She is doing better since she has been more physically active  7. Microalbuminuria  Continue ARB  8. Hypertensive retinopathy, unspecified laterality  Continue follow up with ophthalmologist   9. Dyslipidemia  - rosuvastatin (CRESTOR) 5 MG tablet; Take 1 tablet (5 mg total) by mouth daily.  Dispense: 90 tablet; Refill: 1  10. Chronic constipation  - lubiprostone (AMITIZA) 24 MCG capsule; Take 1 capsule (24 mcg total) by mouth 2 (two) times daily with a meal.  Dispense: 60 capsule; Refill: 2

## 2017-01-15 LAB — MICROALBUMIN / CREATININE URINE RATIO
CREATININE, URINE: 109 mg/dL (ref 20–320)
MICROALB UR: 0.7 mg/dL
Microalb Creat Ratio: 6 mcg/mg creat (ref <30)

## 2017-01-23 ENCOUNTER — Encounter: Payer: BLUE CROSS/BLUE SHIELD | Attending: Family Medicine | Admitting: Dietician

## 2017-01-23 ENCOUNTER — Encounter: Payer: Self-pay | Admitting: Dietician

## 2017-01-23 VITALS — Ht 65.0 in | Wt 220.9 lb

## 2017-01-23 DIAGNOSIS — IMO0001 Reserved for inherently not codable concepts without codable children: Secondary | ICD-10-CM

## 2017-01-23 DIAGNOSIS — E119 Type 2 diabetes mellitus without complications: Secondary | ICD-10-CM | POA: Diagnosis not present

## 2017-01-23 NOTE — Patient Instructions (Signed)
   OK to try smoothie drink for supper if you add protein (15-30grams worth), and some fiber from oats, flax seed. Limit fruit to 1 cup. Can add carrots or other vegetable as well.   Control portions of starches, limit to 1 cup (fist-size) or less.   Check food labels for carbhoydrate (30-45grams each meal) and sodium (average 500-600 mg each meal).  Try types of meals that include a balance of food groups, such as stir-fry recipes, meal in a jar recipes, etc.   Keep up your great exercise!

## 2017-01-23 NOTE — Progress Notes (Signed)
Medical Nutrition Therapy: Visit start time: 0900  end time: 1015  Assessment:  Diagnosis: diabetes Past medical history: HTN, sleep apnea Psychosocial issues/ stress concerns: none Preferred learning method:  Nicki [place] . Hands-on  Current weight: 220.9lbs  Height: 5'5" Medications, supplements: reconciled list in medical record  Progress and evaluation: Patient reports generally good BG control at this time. She seeks help in planning healthy meals. She came for diabetes education several years ago, but states she does not remember much about nutrition and meal planning and needs a refresher.   Physical activity: cardio, including Zumba, 4-5 times a week for 90-120 minutes.   Dietary Intake:  Usual eating pattern includes 3 meals and 1-2 snacks per day. Dining out frequency: 2 meals per week.  Breakfast: 9:30am duirng week-- banana or apple and yogurt, sometimes granola protein bars. Seldom eggs and meat Snack: maybe peanuts Lunch: 12:30pm hot pocket , [place] sandwich, salads -- usually lite meal Snack: peanuts or candy bar rarely chips. Tries to limit or avoid chips. Sometimes fruit before exercising. Weakness is cookies. Supper: often late; eats after exercise-- meat and veg (green beans, beets, cabbage) Snack: none unless awakening hungry during the night; trying to avoid this.  Beverages: water, occasional tea or fruit drink  Nutrition Care Education: Topics covered: general nutrition, diabetes Basic nutrition: basic food groups, appropriate nutrient balance, appropriate meal and snack schedule, general nutrition guidelines    Advanced nutrition: cooking techniques/ options for quick meals, food label reading Diabetes: appropriate meal and snack schedule, appropriate carb intake and balance, basic meal planning for BG and weight control; role of exercise, in BG control   Nutritional Diagnosis:  [place]-2.2 Altered nutrition-related laboratory As related to diabetes.  As evidenced by  MD and patient reports. -3.3 Overweight/obesity As related to history of excess calories.  As evidenced by BMI 36.5.  Intervention: Instruction as noted above.   Set goals with direction from patient.   No follow-up scheduled at this time, patient feels motivated to make changes on her own.    Encouraged patient to call with any questions or concerns.  Education Materials given:  . General diet guidelines for Diabetes . Plate Planner with food lists . Food lists/ Carb Counting and Meal Planning book [employer]) . Sample [place] and Healthy Meal Ideas . Goals/ instructions  Learner/ who was taught:  . Patient   Level of understanding: Marland Kitchen Verbalizes/ demonstrates competency  Demonstrated degree of understanding via:   Teach back Learning barriers: . None  Willingness to learn/ readiness for change: . Eager, change in progress  Monitoring and Evaluation:  Dietary intake, exercise, BG control, and body weight      follow up: prn

## 2017-01-30 ENCOUNTER — Other Ambulatory Visit: Payer: Self-pay

## 2017-01-30 ENCOUNTER — Other Ambulatory Visit: Payer: Self-pay | Admitting: Family Medicine

## 2017-01-30 ENCOUNTER — Telehealth: Payer: Self-pay

## 2017-01-30 MED ORDER — DAPAGLIFLOZIN PROPANEDIOL 10 MG PO TABS: 10.0000 mg | ORAL_TABLET | Freq: Every day | ORAL | 2 refills | Status: DC

## 2017-01-30 NOTE — Progress Notes (Signed)
Left patient a voicemail that voucher will be upfront and ready for pick up.

## 2017-01-30 NOTE — Telephone Encounter (Signed)
Jardiance current medical literature do not support the use of Jardiance over the available formulary alternatives. The two formulary alternatives are [place] and [place]. Please switch to preferred medications.

## 2017-02-26 ENCOUNTER — Other Ambulatory Visit: Payer: Self-pay

## 2017-02-26 MED ORDER — EMPAGLIFLOZIN 10 MG PO TABS: 10.0000 mg | ORAL_TABLET | Freq: Every day | ORAL | 2 refills | Status: DC

## 2017-02-26 NOTE — Telephone Encounter (Signed)
Patient requesting refill of Jardiance 10 mg by [facility].

## 2017-04-24 ENCOUNTER — Ambulatory Visit: Payer: BLUE CROSS/BLUE SHIELD | Admitting: Family Medicine

## 2017-05-20 DIAGNOSIS — E042 Nontoxic multinodular goiter: Secondary | ICD-10-CM | POA: Diagnosis not present

## 2017-05-22 DIAGNOSIS — E042 Nontoxic multinodular goiter: Secondary | ICD-10-CM | POA: Diagnosis not present

## 2017-05-22 DIAGNOSIS — E039 Hypothyroidism, unspecified: Secondary | ICD-10-CM | POA: Diagnosis not present

## 2017-06-18 ENCOUNTER — Encounter: Payer: Self-pay | Admitting: Family Medicine

## 2017-06-18 ENCOUNTER — Ambulatory Visit (INDEPENDENT_AMBULATORY_CARE_PROVIDER_SITE_OTHER): Payer: BLUE CROSS/BLUE SHIELD | Admitting: Family Medicine

## 2017-06-18 VITALS — BP 126/79 | HR 80 | Temp 98.4°F | Resp 16 | Ht 65.0 in | Wt 225.8 lb

## 2017-06-18 DIAGNOSIS — E039 Hypothyroidism, unspecified: Secondary | ICD-10-CM

## 2017-06-18 DIAGNOSIS — N183 Chronic kidney disease, stage 3 unspecified: Secondary | ICD-10-CM

## 2017-06-18 DIAGNOSIS — G4733 Obstructive sleep apnea (adult) (pediatric): Secondary | ICD-10-CM | POA: Diagnosis not present

## 2017-06-18 DIAGNOSIS — E1121 Type 2 diabetes mellitus with diabetic nephropathy: Secondary | ICD-10-CM | POA: Diagnosis not present

## 2017-06-18 DIAGNOSIS — I10 Essential (primary) hypertension: Secondary | ICD-10-CM | POA: Diagnosis not present

## 2017-06-18 DIAGNOSIS — E669 Obesity, unspecified: Secondary | ICD-10-CM

## 2017-06-18 DIAGNOSIS — R202 Paresthesia of skin: Secondary | ICD-10-CM | POA: Diagnosis not present

## 2017-06-18 DIAGNOSIS — E042 Nontoxic multinodular goiter: Secondary | ICD-10-CM | POA: Diagnosis not present

## 2017-06-18 LAB — GLUCOSE, POCT (MANUAL RESULT ENTRY): POC Glucose: 104 mg/dl — AB (ref 70–99)

## 2017-06-18 LAB — POCT GLYCOSYLATED HEMOGLOBIN (HGB A1C): Hemoglobin A1C: 6.7

## 2017-06-18 MED ORDER — NALTREXONE-BUPROPION HCL ER 8-90 MG PO TB12: 2.0000 | ORAL_TABLET | Freq: Two times a day (BID) | ORAL | 2 refills | Status: DC

## 2017-06-18 MED ORDER — EMPAGLIFLOZIN-METFORMIN HCL 12.5-500 MG PO TABS: 1.0000 | ORAL_TABLET | Freq: Every day | ORAL | 2 refills | Status: DC

## 2017-06-18 NOTE — Progress Notes (Signed)
Name: Andrea Randolph   MRN: [id number]    DOB: 01/05/1956   Date:06/18/2017       Progress Note  Subjective  Chief Complaint  Chief Complaint  Patient presents with  . Follow-up    DM  . Leg Pain    bilateral    HPI  DM II: she is taking medication as directed - on Farxiga, denies side effectsl hgbA1C was down to 5.9% at one point, up to 6.3% , 6.0% , 6.2% then down again to 6.0% at last visit; today it is 6.7%. Urine micro was 50 in 20166, and 109  On 01/14/17. She denies polyphagia, polydipsia, or polyuria.  She is not checking fsbs at home.. She is taking a daily aspirin daily, Crestor every other day. Eye exam is up to date. She is on ARB. She denies hypoglycemic episodes.   Bilateral Leg Pain: Walks a lot at work on concrete floors, wears supportive sneaks; has diabetes; describes pain as aching and sometimes a numb feeling - sometimes needs to sit down if she's been walking a lot because one of her legs will go numb randomly. Numbness and pain both start in the feet and radiate up the leg.   HTN: she has been compliant with medication and denies side effects of medication. No chest pain or palpitation. She is exercising at least 5 times weekly she no longer has noticed any dizziness. BP slightly up today.   Hyperlipidemia: she is taking Crestor 5 mg ( down from 20 because of myalgia ), she is only taking every other day and LDL is at goal   OSA: she has not been compliant with CPAP machine, she states she looks at the mask every night but she is not placing it on her face. Explained the importance of compliance. Discussed risk of strokes and heart attacks. She states hot flashes is not helping  Obesity: she states she has been going to [place] classed 3 times weekly.She has health coach at work, she sees her quarterly and she gives her some recipe ideas for low-carbohydrate meals.  She started Qsymia in July 2016and lost weight, but prescription was too expensive. She was switched  to Contrave 08/2015, her weight was 224 lbs and lost 16 lbs but it stopped working. Tried Saxenda but it made her gain weight. Tried Belviq and this was not covered by her insurance.  Has also gone to see nutrition in the past.  Chronic constipation: for many years, tried Metamucil otc without help, discussed options and we will try Amitiza. Bowel movements about 3 times a week, she has to strain but no blood in stools. She has been eating prunes every morning and this has been working well.  Hypothyroidism: No skin/hair/nail changes, no palpitations. Sees endocrinology - Sees Dr. Gabriel Carina to monitor goiter.  Patient Active Problem List   Diagnosis Date Noted  . Chronic kidney disease, stage III (moderate) 06/22/2016  . Paresthesia of arm 03/20/2016  . Uterine fibroid 01/11/2016  . PMB (postmenopausal bleeding) 01/03/2016  . Allergic rhinitis, seasonal 05/24/2015  . Benign essential HTN 05/24/2015  . Undiagnosed cardiac murmurs 05/24/2015  . Chronic constipation 05/24/2015  . Diabetes mellitus with renal manifestation ([place]) 05/24/2015  . Dyslipidemia 05/24/2015  . Goiter 05/24/2015  . Acquired hypothyroidism 05/24/2015  . Mixed incontinence 05/24/2015  . Microalbuminuria 05/24/2015  . Iron deficiency anemia 05/24/2015  . Menopause 05/24/2015  . Obstructive apnea 05/24/2015  . Adult BMI 30+ 05/24/2015  . Hypertensive retinopathy 05/24/2015  .  Vitamin D deficiency 05/24/2015  . Status post total replacement of right shoulder 03/11/2015    Past Surgical History:  Procedure Laterality Date  . BREAST BIOPSY Right 90's   benign core needle   . FOOT SURGERY    . TOTAL SHOULDER REPLACEMENT Right 01/25/2015   Dr. Roland Rack    Family History  Problem Relation Age of Onset  . Heart disease Mother   . Heart disease Father   . Kidney disease Sister   . Hypertension Sister   . Diabetes Sister   . Hypertension Brother   . CVA Brother   . Breast cancer Maternal Aunt 17  . Ovarian cancer  Neg Hx   . Colon cancer Neg Hx     Social History   Social History  . Marital status: Married    Spouse name: N/A  . Number of children: N/A  . Years of education: N/A   Occupational History  . Not on file.   Social History Main Topics  . Smoking status: Never Smoker  . Smokeless tobacco: Never Used  . Alcohol use No  . Drug use: No  . Sexual activity: Not Currently    Birth control/ protection: Post-menopausal   Other Topics Concern  . Not on file   Social History Narrative  . No narrative on file     Current Outpatient Prescriptions:  .  ammonium lactate (LAC-HYDRIN) 12 % cream, Apply topically as needed for dry skin., Disp: 385 g, Rfl: 0 .  aspirin 81 MG tablet, Take by mouth., Disp: , Rfl:  .  dapagliflozin propanediol (FARXIGA) 10 MG TABS tablet, Take 10 mg by mouth daily., Disp: 30 tablet, Rfl: 2 .  empagliflozin (JARDIANCE) 10 MG TABS tablet, Take 10 mg by mouth daily., Disp: 30 tablet, Rfl: 2 .  glucose blood (FREESTYLE LITE) test strip, FREESTYLE LITE TEST (In Vitro Strip)  use as directed for 0 days  Quantity: 200.00;  Refills: 3   Ordered :25-Nov-2014  Vonna Kotyk ;  Started 30-Dec-2008 Active Comments: DX: 250.00, Disp: , Rfl:  .  ketoconazole (NIZORAL) 2 % cream, Apply 1 application topically daily., Disp: 60 g, Rfl: 0 .  levothyroxine (SYNTHROID, LEVOTHROID) 88 MCG tablet, Take 1 tablet (88 mcg total) by mouth daily., Disp: 30 tablet, Rfl: 0 .  Multiple Vitamin (MULTIVITAMIN) tablet, Take 1 tablet by mouth daily., Disp: , Rfl:  .  Olmesartan-Amlodipine-HCTZ 40-5-25 MG TABS, Take 1 tablet by mouth daily., Disp: 90 tablet, Rfl: 1 .  rosuvastatin (CRESTOR) 5 MG tablet, Take 1 tablet (5 mg total) by mouth daily., Disp: 90 tablet, Rfl: 1 .  Cholecalciferol (VITAMIN D3) 1000 units CAPS, Take 2 capsules by mouth daily., Disp: , Rfl:  .  Lorcaserin HCl ER (BELVIQ XR) 20 MG TB24, Take 1 tablet by mouth daily. (Patient not taking: Reported on 01/23/2017), Disp: 30  tablet, Rfl: 2 .  lubiprostone (AMITIZA) 24 MCG capsule, Take 1 capsule (24 mcg total) by mouth 2 (two) times daily with a meal. (Patient not taking: Reported on 06/18/2017), Disp: 60 capsule, Rfl: 2  No Known Allergies   ROS  Constitutional: Negative for fever or significant weight change.  Respiratory: Negative for cough and shortness of breath.   Cardiovascular: Negative for chest pain or palpitations.  Gastrointestinal: Negative for abdominal pain, no bowel changes.  Musculoskeletal: Negative for gait problem or joint swelling. See HPI Skin: Negative for rash.  Neurological: Negative for dizziness or headache.  No other specific complaints in a complete review of  systems (except as listed in HPI above).  Objective  Vitals:   06/18/17 1153  BP: 126/79  Pulse: 80  Resp: 16  Temp: 98.4 F (36.9 C)  TempSrc: Oral  SpO2: 98%  Weight: 225 lb 12.8 oz (102.4 kg)  Height: 5\' 5"  (1.651 m)   Body mass index is 37.58 kg/m.  Physical Exam  Constitutional: Patient appears well-developed and well-nourished. Obese No distress.  HEENT: head atraumatic, normocephalic, pupils equal and reactive to light,  neck supple, throat within normal limits Cardiovascular: Normal rate, regular rhythm and normal heart sounds.  No murmur heard. No BLE edema. Pulmonary/Chest: Effort normal and breath sounds normal. No respiratory distress. Abdominal: Soft.  There is no tenderness. Psychiatric: Patient has a normal mood and affect. behavior is normal. Judgment and thought content normal. MSK: no pain during palpation of lumbar spine Peripheral Vascular: normal distal pulses and perfusion of both feet   Recent Results (from the past 2160 hour(s))  POCT HgB A1C     Status: Abnormal   Collection Time: 06/18/17 11:59 AM  Result Value Ref Range   Hemoglobin A1C 6.7   POCT Glucose (CBG)     Status: Abnormal   Collection Time: 06/18/17 11:59 AM  Result Value Ref Range   POC Glucose 104 (A) 70 - 99 mg/dl     Diabetic Foot Exam: Diabetic Foot Exam - Simple   No data filed     PHQ2/9: Depression screen [facility] 2/9 06/18/2017 01/23/2017 12/28/2016 09/25/2016 03/20/2016  Decreased Interest 0 0 0 0 0  Down, Depressed, Hopeless 0 0 0 0 0  PHQ - 2 Score 0 0 0 0 0    Fall Risk: Fall Risk  06/18/2017 01/23/2017 12/28/2016 09/25/2016 03/20/2016  Falls in the past year? No No No No No   Functional Status Survey: Is the patient deaf or have difficulty hearing?: No Does the patient have difficulty seeing, even when wearing glasses/contacts?: Yes (glasses and contacts) Does the patient have difficulty concentrating, remembering, or making decisions?: No Does the patient have difficulty walking or climbing stairs?: No Does the patient have difficulty dressing or bathing?: No Does the patient have difficulty doing errands alone such as visiting a doctor's office or shopping?: No  Assessment & Plan  1. Type 2 diabetes mellitus with diabetic nephropathy, without long-term current use of insulin (HCC) - POCT HgB A1C - POCT Glucose (CBG) - Empagliflozin-Metformin HCl (SYNJARDY) 12.5-500 MG TABS; Take 1 tablet by mouth daily.  Dispense: 60 tablet; Refill: 2  2. Benign essential HTN Stable, continue medications  3. Obstructive apnea Advised to wear CPAP, pt is chronically non-compliant  4. Acquired hypothyroidism Continue follow up with Dr. Gabriel Carina  5. Chronic kidney disease, stage III (moderate) Stable.  On ARB.  6. Obesity (BMI 30-39.9) - Naltrexone-Bupropion HCl ER (CONTRAVE) 8-90 MG TB12; Take 2 tablets by mouth 2 (two) times daily.  Dispense: 120 tablet; Refill: 2 - Pt did well on this medication in the past, we will restart this medication. Discussed diet and exercise as cornerstone to weight management. Discussed importance of titratting medication slowly and the possible side effects  7. Paresthesia of both lower extremities - Nerve conduction test; Future - Discussed fall precautions, especially  while at work and exercising.    -Reviewed Health Maintenance:

## 2017-09-19 ENCOUNTER — Encounter: Payer: Self-pay | Admitting: Family Medicine

## 2017-09-19 ENCOUNTER — Ambulatory Visit (INDEPENDENT_AMBULATORY_CARE_PROVIDER_SITE_OTHER): Payer: BLUE CROSS/BLUE SHIELD | Admitting: Family Medicine

## 2017-09-19 ENCOUNTER — Other Ambulatory Visit: Payer: Self-pay

## 2017-09-19 VITALS — BP 130/80 | HR 70 | Resp 14 | Ht 65.0 in | Wt 232.7 lb

## 2017-09-19 DIAGNOSIS — R200 Anesthesia of skin: Secondary | ICD-10-CM | POA: Diagnosis not present

## 2017-09-19 DIAGNOSIS — I10 Essential (primary) hypertension: Secondary | ICD-10-CM

## 2017-09-19 DIAGNOSIS — E669 Obesity, unspecified: Secondary | ICD-10-CM

## 2017-09-19 DIAGNOSIS — E1121 Type 2 diabetes mellitus with diabetic nephropathy: Secondary | ICD-10-CM | POA: Diagnosis not present

## 2017-09-19 DIAGNOSIS — E559 Vitamin D deficiency, unspecified: Secondary | ICD-10-CM

## 2017-09-19 DIAGNOSIS — E785 Hyperlipidemia, unspecified: Secondary | ICD-10-CM

## 2017-09-19 DIAGNOSIS — R202 Paresthesia of skin: Secondary | ICD-10-CM

## 2017-09-19 DIAGNOSIS — H35039 Hypertensive retinopathy, unspecified eye: Secondary | ICD-10-CM

## 2017-09-19 DIAGNOSIS — E039 Hypothyroidism, unspecified: Secondary | ICD-10-CM

## 2017-09-19 DIAGNOSIS — G4733 Obstructive sleep apnea (adult) (pediatric): Secondary | ICD-10-CM | POA: Diagnosis not present

## 2017-09-19 LAB — POCT GLYCOSYLATED HEMOGLOBIN (HGB A1C): Hemoglobin A1C: 6.4

## 2017-09-19 MED ORDER — ROSUVASTATIN CALCIUM 5 MG PO TABS: 5.0000 mg | ORAL_TABLET | Freq: Every day | ORAL | 1 refills | Status: DC

## 2017-09-19 MED ORDER — OLMESARTAN-AMLODIPINE-HCTZ 40-5-25 MG PO TABS: 1.0000 | ORAL_TABLET | Freq: Every day | ORAL | 1 refills | Status: DC

## 2017-09-19 MED ORDER — EMPAGLIFLOZIN-METFORMIN HCL 12.5-500 MG PO TABS: 1.0000 | ORAL_TABLET | Freq: Every day | ORAL | 2 refills | Status: DC

## 2017-09-19 NOTE — Progress Notes (Signed)
Name: Andrea Randolph   MRN: [id number]    DOB: [birth year]   Date:09/19/2017       Progress Note  Subjective  Chief Complaint  Chief Complaint  Patient presents with  . Hyperlipidemia  . Hypertension  . Diabetes    HPI  DM II: she is taking medication as directed - on Synjardi and is tolerating well, she  denies side effects hgbA1C was down to 5.9% at one point, up to 6.3% , 6.0% ,6.2% then down again to 6.0%,  6.7% but now at 6.4% Urine micro was 50 in 2016, and was normal in 01/14/17. She denies polyphagia, polydipsia, or polyuria. She is not checking fsbs at home. She is taking a daily aspirin daily, Crestor every other day. Eye exam is up to date. She is on ARB. She denies hypoglycemic episodes. She has gained some weight since last visit, she states insurance is no longer paying for weight loss medication  Bilateral Leg Pain: Walks a lot at work on concrete floors, wears supportive sneaks; has diabetes; describes pain as aching and sometimes a numb feeling - sometimes needs to sit down if she's been walking a lot because one of her legs will go numb randomly. Numbness and pain both start in the feet and radiate up the leg, worse on left side, but happens on both legs. We will check B12 and send her for NCS  HTN: she has been compliant with medication and denies side effects of medication. No chest pain or palpitation. She is exercising but down to a couple of times a week.   Hyperlipidemia: she is taking Crestor 5 mg ( down from 20 because of myalgia ), she is only taking every other day and LDL is at goal   OSA: she has not been compliant with CPAP machine, she states she looks at the mask every night but she is not placing it on her face. Explained the importance of compliance. Discussed risk of strokes and heart attacks. She states hot flashes is not helping  Obesity: she states   She started Qsymia in July 2016and lost weight, but prescription was too expensive. She was  switched to Contrave 08/2015, her weight was 224 lbs and lost 16 lbs but it stopped working. Tried Saxenda but it made her gain weight. Tried Belviq and this was not covered by her insurance.  Has also gone to see nutrition in the past.She is frustrated and gaining weight again, frustrated about not getting medication covered.   Chronic constipation: for many years, tried Metamucil otc without help, discussed options and we will try Amitiza. Bowel movements about 3 times a week, she has to strain but no blood in stools. She has been eating prunes every morning and this has been working well.  Hypothyroidism: No skin/hair/nail changes, no palpitations. Sees endocrinology - Sees Dr. Gabriel Carina to monitor goiter. She wants me to check TSH today for her and fax to Dr. Gabriel Carina    Patient Active Problem List   Diagnosis Date Noted  . Chronic kidney disease, stage III (moderate) ([place]) 06/22/2016  . Paresthesia of arm 03/20/2016  . Uterine fibroid 01/11/2016  . PMB (postmenopausal bleeding) 01/03/2016  . Allergic rhinitis, seasonal 05/24/2015  . Benign essential HTN 05/24/2015  . Undiagnosed cardiac murmurs 05/24/2015  . Chronic constipation 05/24/2015  . Diabetes mellitus with renal manifestation ([place]) 05/24/2015  . Dyslipidemia 05/24/2015  . Goiter 05/24/2015  . Acquired hypothyroidism 05/24/2015  . Mixed incontinence 05/24/2015  . Microalbuminuria 05/24/2015  .  Iron deficiency anemia 05/24/2015  . Menopause 05/24/2015  . Obstructive apnea 05/24/2015  . Adult BMI 30+ 05/24/2015  . Hypertensive retinopathy 05/24/2015  . Vitamin D deficiency 05/24/2015  . Status post total replacement of right shoulder 03/11/2015    Past Surgical History:  Procedure Laterality Date  . BREAST BIOPSY Right 90's   benign core needle   . FOOT SURGERY    . TOTAL SHOULDER REPLACEMENT Right 01/25/2015   Dr. Roland Rack    Family History  Problem Relation Age of Onset  . Heart disease Mother   . Heart disease Father    . Kidney disease Sister   . Hypertension Sister   . Diabetes Sister   . Hypertension Brother   . CVA Brother   . Breast cancer Maternal Aunt 55  . Hypertension Sister   . Kidney failure Sister   . Ovarian cancer Neg Hx   . Colon cancer Neg Hx     Social History   Socioeconomic History  . Marital status: Married    Spouse name: Not on file  . Number of children: Not on file  . Years of education: Not on file  . Highest education level: Not on file  Social Needs  . Financial resource strain: Not on file  . Food insecurity - worry: Not on file  . Food insecurity - inability: Not on file  . Transportation needs - medical: Not on file  . Transportation needs - non-medical: Not on file  Occupational History  . Not on file  Tobacco Use  . Smoking status: Never Smoker  . Smokeless tobacco: Never Used  Substance and Sexual Activity  . Alcohol use: No    Alcohol/week: 0.0 oz  . Drug use: No  . Sexual activity: Not Currently    Birth control/protection: Post-menopausal  Other Topics Concern  . Not on file  Social History Narrative  . Not on file     Current Outpatient Medications:  .  ammonium lactate (LAC-HYDRIN) 12 % cream, Apply topically as needed for dry skin., Disp: 385 g, Rfl: 0 .  aspirin 81 MG tablet, Take by mouth., Disp: , Rfl:  .  Empagliflozin-Metformin HCl (SYNJARDY) 12.5-500 MG TABS, Take 1 tablet daily by mouth., Disp: 60 tablet, Rfl: 2 .  glucose blood (FREESTYLE LITE) test strip, FREESTYLE LITE TEST (In Vitro Strip)  use as directed for 0 days  Quantity: 200.00;  Refills: 3   Ordered :25-Nov-2014  Vonna Kotyk ;  Started 30-Dec-2008 Active Comments: DX: 250.00, Disp: , Rfl:  .  ketoconazole (NIZORAL) 2 % cream, Apply 1 application topically daily., Disp: 60 g, Rfl: 0 .  levothyroxine (SYNTHROID, LEVOTHROID) 88 MCG tablet, Take 1 tablet (88 mcg total) by mouth daily., Disp: 30 tablet, Rfl: 0 .  Multiple Vitamin (MULTIVITAMIN) tablet, Take 1 tablet by  mouth daily., Disp: , Rfl:  .  Olmesartan-Amlodipine-HCTZ 40-5-25 MG TABS, Take 1 tablet daily by mouth., Disp: 90 tablet, Rfl: 1 .  rosuvastatin (CRESTOR) 5 MG tablet, Take 1 tablet (5 mg total) daily by mouth., Disp: 90 tablet, Rfl: 1 .  Cholecalciferol (VITAMIN D3) 1000 units CAPS, Take 2 capsules by mouth daily., Disp: , Rfl:   No Known Allergies   ROS  Constitutional: Negative for fever , positive for weight change.  Respiratory: Negative for cough and shortness of breath.   Cardiovascular: Negative for chest pain or palpitations.  Gastrointestinal: Negative for abdominal pain, no bowel changes.  Musculoskeletal: Negative for gait problem or joint swelling.  Skin: Negative for rash.  Neurological: Negative for dizziness or headache.  No other specific complaints in a complete review of systems (except as listed in HPI above).  Objective  Vitals:   09/19/17 0858  BP: 130/80  Pulse: 70  Resp: 14  SpO2: 99%  Weight: 232 lb 11.2 oz (105.6 kg)  Height: 5\' 5"  (1.651 m)    Body mass index is 38.72 kg/m.  Physical Exam  Constitutional: Patient appears well-developed and well-nourished. Obese No distress.  HEENT: head atraumatic, normocephalic, pupils equal and reactive to light, neck supple, throat within normal limits, thyromegaly Cardiovascular: Normal rate, regular rhythm and normal heart sounds.  2./6 ejection  murmur heard. No BLE edema. Pulmonary/Chest: Effort normal and breath sounds normal. No respiratory distress. Abdominal: Soft.  There is no tenderness. Psychiatric: Patient has a normal mood and affect. behavior is normal. Judgment and thought content normal.  Recent Results (from the past 2160 hour(s))  POCT HgB A1C     Status: Abnormal   Collection Time: 09/19/17  9:19 AM  Result Value Ref Range   Hemoglobin A1C 6.4       PHQ2/9: Depression screen [facility] 2/9 06/18/2017 01/23/2017 12/28/2016 09/25/2016 03/20/2016  Decreased Interest 0 0 0 0 0  Down, Depressed,  Hopeless 0 0 0 0 0  PHQ - 2 Score 0 0 0 0 0     Fall Risk: Fall Risk  09/19/2017 06/18/2017 01/23/2017 12/28/2016 09/25/2016  Falls in the past year? No No No No No     Assessment & Plan  1. Type 2 diabetes mellitus with diabetic nephropathy, without long-term current use of insulin (HCC)  - POCT HgB A1C - Empagliflozin-Metformin HCl (SYNJARDY) 12.5-500 MG TABS; Take 1 tablet daily by mouth.  Dispense: 60 tablet; Refill: 2  2. Benign essential HTN  - Olmesartan-Amlodipine-HCTZ 40-5-25 MG TABS; Take 1 tablet daily by mouth.  Dispense: 90 tablet; Refill: 1 - Comp panel - CBC  3. Dyslipidemia  - rosuvastatin (CRESTOR) 5 MG tablet; Take 1 tablet (5 mg total) daily by mouth.  Dispense: 90 tablet; Refill: 1  4. Obesity (BMI 30-39.9)  Insurance is not covering medications, she has gained some weight.   5. Obstructive apnea  Not wearing CPAP but is doing some research  6. Acquired hypothyroidism  Sees Dr. Gabriel Carina, last TSH was 05/2017 and was at goal  -TSH  7. Hypertensive retinopathy, unspecified laterality  Continue yearly eye exam  8. Numbness in feet  - NCV with EMG(electromyography); Future -B12 level  9. Vitamin D deficiency  - VITAMIN D 25 Hydroxy (Vit-D Deficiency, Fractures)

## 2017-09-20 LAB — CBC WITH DIFFERENTIAL/PLATELET
BASOS PCT: 0.5 %
Basophils Absolute: 20 cells/uL (ref 0–200)
Eosinophils Absolute: 68 cells/uL (ref 15–500)
Eosinophils Relative: 1.7 %
HCT: 39.3 % (ref 35.0–45.0)
HEMOGLOBIN: 12.7 g/dL (ref 11.7–15.5)
LYMPHS ABS: 1900 {cells}/uL (ref 850–3900)
MCH: 25.9 pg — ABNORMAL LOW (ref 27.0–33.0)
MCHC: 32.3 g/dL (ref 32.0–36.0)
MCV: 80 fL (ref 80.0–100.0)
MPV: 9.8 fL (ref 7.5–12.5)
Monocytes Relative: 7.7 %
NEUTROS ABS: 1704 {cells}/uL (ref 1500–7800)
Neutrophils Relative %: 42.6 %
PLATELETS: 360 10*3/uL (ref 140–400)
RBC: 4.91 10*6/uL (ref 3.80–5.10)
RDW: 14.4 % (ref 11.0–15.0)
TOTAL LYMPHOCYTE: 47.5 %
WBC: 4 10*3/uL (ref 3.8–10.8)
WBCMIX: 308 {cells}/uL (ref 200–950)

## 2017-09-20 LAB — COMPLETE METABOLIC PANEL WITH GFR
AG RATIO: 1.4 (calc) (ref 1.0–2.5)
ALT: 16 U/L (ref 6–29)
AST: 15 U/L (ref 10–35)
Albumin: 3.9 g/dL (ref 3.6–5.1)
Alkaline phosphatase (APISO): 46 U/L (ref 33–130)
BILIRUBIN TOTAL: 0.4 mg/dL (ref 0.2–1.2)
BUN/Creatinine Ratio: 15 (calc) (ref 6–22)
BUN: 16 mg/dL (ref 7–25)
CHLORIDE: 105 mmol/L (ref 98–110)
CO2: 30 mmol/L (ref 20–32)
Calcium: 9.2 mg/dL (ref 8.6–10.4)
Creat: 1.07 mg/dL — ABNORMAL HIGH (ref 0.50–1.05)
GFR, EST AFRICAN AMERICAN: 66 mL/min/{1.73_m2} (ref >=60)
GFR, EST NON AFRICAN AMERICAN: 57 mL/min/{1.73_m2} — AB (ref >=60)
GLOBULIN: 2.8 g/dL (ref 1.9–3.7)
Glucose, Bld: 103 mg/dL — ABNORMAL HIGH (ref 65–99)
POTASSIUM: 3.7 mmol/L (ref 3.5–5.3)
SODIUM: 142 mmol/L (ref 135–146)
TOTAL PROTEIN: 6.7 g/dL (ref 6.1–8.1)

## 2017-09-20 LAB — VITAMIN B12: Vitamin B-12: 463 pg/mL (ref 200–1100)

## 2017-09-20 LAB — LIPID PANEL
Cholesterol: 208 mg/dL — ABNORMAL HIGH (ref <200)
HDL: 57 mg/dL (ref >50)
LDL Cholesterol (Calc): 132 mg/dL (calc) — ABNORMAL HIGH
NON-HDL CHOLESTEROL (CALC): 151 mg/dL — AB (ref <130)
TRIGLYCERIDES: 87 mg/dL (ref <150)
Total CHOL/HDL Ratio: 3.6 (calc) (ref <5.0)

## 2017-09-20 LAB — TSH: TSH: 0.6 mIU/L (ref 0.40–4.50)

## 2017-09-20 LAB — VITAMIN D 25 HYDROXY (VIT D DEFICIENCY, FRACTURES): Vit D, 25-Hydroxy: 37 ng/mL (ref 30–100)

## 2017-09-25 DIAGNOSIS — M545 Low back pain: Secondary | ICD-10-CM | POA: Diagnosis not present

## 2017-09-25 DIAGNOSIS — M5416 Radiculopathy, lumbar region: Secondary | ICD-10-CM | POA: Diagnosis not present

## 2017-10-11 DIAGNOSIS — M545 Low back pain: Secondary | ICD-10-CM | POA: Diagnosis not present

## 2017-10-11 DIAGNOSIS — M5416 Radiculopathy, lumbar region: Secondary | ICD-10-CM | POA: Diagnosis not present

## 2017-10-18 DIAGNOSIS — M5416 Radiculopathy, lumbar region: Secondary | ICD-10-CM | POA: Diagnosis not present

## 2017-10-18 DIAGNOSIS — M545 Low back pain: Secondary | ICD-10-CM | POA: Diagnosis not present

## 2017-10-28 DIAGNOSIS — M545 Low back pain: Secondary | ICD-10-CM | POA: Diagnosis not present

## 2017-10-28 DIAGNOSIS — M5416 Radiculopathy, lumbar region: Secondary | ICD-10-CM | POA: Diagnosis not present

## 2017-11-11 ENCOUNTER — Other Ambulatory Visit: Payer: Self-pay | Admitting: Family Medicine

## 2017-11-11 DIAGNOSIS — Z1231 Encounter for screening mammogram for malignant neoplasm of breast: Secondary | ICD-10-CM

## 2017-11-11 DIAGNOSIS — M545 Low back pain: Secondary | ICD-10-CM | POA: Diagnosis not present

## 2017-11-11 DIAGNOSIS — M5416 Radiculopathy, lumbar region: Secondary | ICD-10-CM | POA: Diagnosis not present

## 2017-11-18 DIAGNOSIS — M5416 Radiculopathy, lumbar region: Secondary | ICD-10-CM | POA: Diagnosis not present

## 2017-11-18 DIAGNOSIS — M545 Low back pain: Secondary | ICD-10-CM | POA: Diagnosis not present

## 2017-11-27 DIAGNOSIS — M5416 Radiculopathy, lumbar region: Secondary | ICD-10-CM | POA: Diagnosis not present

## 2017-12-13 ENCOUNTER — Other Ambulatory Visit: Payer: Self-pay | Admitting: Orthopedic Surgery

## 2017-12-13 DIAGNOSIS — M5416 Radiculopathy, lumbar region: Secondary | ICD-10-CM

## 2017-12-23 DIAGNOSIS — H5213 Myopia, bilateral: Secondary | ICD-10-CM | POA: Diagnosis not present

## 2017-12-24 ENCOUNTER — Encounter: Payer: Self-pay | Admitting: Family Medicine

## 2017-12-24 ENCOUNTER — Ambulatory Visit (INDEPENDENT_AMBULATORY_CARE_PROVIDER_SITE_OTHER): Payer: BLUE CROSS/BLUE SHIELD | Admitting: Family Medicine

## 2017-12-24 VITALS — BP 122/72 | HR 79 | Temp 97.8°F | Resp 16 | Ht 65.0 in | Wt 230.5 lb

## 2017-12-24 DIAGNOSIS — I350 Nonrheumatic aortic (valve) stenosis: Secondary | ICD-10-CM | POA: Insufficient documentation

## 2017-12-24 DIAGNOSIS — I10 Essential (primary) hypertension: Secondary | ICD-10-CM | POA: Diagnosis not present

## 2017-12-24 DIAGNOSIS — E785 Hyperlipidemia, unspecified: Secondary | ICD-10-CM

## 2017-12-24 DIAGNOSIS — I351 Nonrheumatic aortic (valve) insufficiency: Secondary | ICD-10-CM

## 2017-12-24 DIAGNOSIS — G4733 Obstructive sleep apnea (adult) (pediatric): Secondary | ICD-10-CM | POA: Diagnosis not present

## 2017-12-24 DIAGNOSIS — Z01419 Encounter for gynecological examination (general) (routine) without abnormal findings: Secondary | ICD-10-CM

## 2017-12-24 DIAGNOSIS — I272 Pulmonary hypertension, unspecified: Secondary | ICD-10-CM | POA: Insufficient documentation

## 2017-12-24 DIAGNOSIS — Z113 Encounter for screening for infections with a predominantly sexual mode of transmission: Secondary | ICD-10-CM | POA: Diagnosis not present

## 2017-12-24 DIAGNOSIS — N183 Chronic kidney disease, stage 3 unspecified: Secondary | ICD-10-CM

## 2017-12-24 DIAGNOSIS — I517 Cardiomegaly: Secondary | ICD-10-CM | POA: Diagnosis not present

## 2017-12-24 DIAGNOSIS — M19019 Primary osteoarthritis, unspecified shoulder: Secondary | ICD-10-CM | POA: Insufficient documentation

## 2017-12-24 DIAGNOSIS — Z1239 Encounter for other screening for malignant neoplasm of breast: Secondary | ICD-10-CM

## 2017-12-24 DIAGNOSIS — E039 Hypothyroidism, unspecified: Secondary | ICD-10-CM

## 2017-12-24 DIAGNOSIS — Z124 Encounter for screening for malignant neoplasm of cervix: Secondary | ICD-10-CM

## 2017-12-24 NOTE — Progress Notes (Signed)
Name: Andrea Randolph   MRN: [id number]    DOB: [birth year]   Date:12/24/2017       Progress Note  Subjective  Chief Complaint  Chief Complaint  Patient presents with  . Annual Exam  . Sleep Apnea    HPI  Patient presents for annual CPE and follow up  Abnormal echo: she had an echo done in 2012 that showed mild pulmonary hypertension, also mild LVH and aortic stenosis. She states had a heart murmur all her life. She denies chest tightness, no decrease in exercise tolerance - able to do two exercises classes back to back - like zumba and belly dancing. She denies orthopenea  OSA: she has not been compliant with CPAP machine, she wakes up gasping for air intermittently. Last time she had machine one was for shoulder surgery in 2016.  Hypothyroidism: she has a goiter, and is under the care of Dr. Gabriel Carina  HTN: she is compliant with bp medication and bp has been at goal, no chest pain or palpitation  Dyslipidemia: she is taking Crestor, but only every other day, explained LDL not at goal, she will try to take it a little more often.   DM: we will obtain eye exam records  CKI: last labs were stable.     USPSTF grade A and B recommendations  Depression:  Depression screen [facility] 2/9 12/24/2017 06/18/2017 01/23/2017 12/28/2016 09/25/2016  Decreased Interest 0 0 0 0 0  Down, Depressed, Hopeless 0 0 0 0 0  PHQ - 2 Score 0 0 0 0 0   Hypertension: BP Readings from Last 3 Encounters:  12/24/17 122/72  09/19/17 130/80  06/18/17 126/79   Obesity: Wt Readings from Last 3 Encounters:  12/24/17 230 lb 8 oz (104.6 kg)  09/19/17 232 lb 11.2 oz (105.6 kg)  06/18/17 225 lb 12.8 oz (102.4 kg)   BMI Readings from Last 3 Encounters:  12/24/17 38.36 kg/m  09/19/17 38.72 kg/m  06/18/17 37.58 kg/m     HIV, hep B, hep C: check today  STD testing and prevention (chl/gon/syphilis): today  Intimate partner violence:negative screen  Sexual History/Pain during Intercourse: sexually active with a  friend, monogamous relationship, but we will check for STI Menstrual History/LMP/Abnormal Bleeding: reminded her that if she bleeds she needs to let me know asap Incontinence Symptoms: she has stress incontinence, also has urge incontinence. Discussed PT, medication, but she wants to hold off at this time  Advanced Care Planning: A voluntary discussion about advance care planning including the explanation and discussion of advance directives.  Discussed health care proxy and Living will, and the patient was able to identify a health care proxy as daughter [phone] ) Orpha Bur   Patient does not have a living will at present time  Breast cancer:  HM Mammogram  Date Value Ref Range Status  10/01/2014 Normal  Final    BRCA gene screening: not indicated  Cervical cancer screening: due for repeat today   Osteoporosis: discussed bone density, we will hold off for now Fall prevention/vitamin D: on supplementation  Lipids:  Lab Results  Component Value Date   CHOL 208 (H) 09/19/2017   CHOL 156 12/28/2016   CHOL 160 06/20/2016   Lab Results  Component Value Date   HDL 57 09/19/2017   HDL 48 (L) 12/28/2016   HDL 60 06/20/2016   Lab Results  Component Value Date   LDLCALC 99 12/28/2016   LDLCALC 86 06/20/2016   LDLCALC 103 (H) 12/21/2015   Lab  Results  Component Value Date   TRIG 87 09/19/2017   TRIG 47 12/28/2016   TRIG 72 06/20/2016   Lab Results  Component Value Date   CHOLHDL 3.6 09/19/2017   CHOLHDL 3.3 12/28/2016   CHOLHDL 2.7 06/20/2016   No results found for: LDLDIRECT  Glucose:  Glucose, Bld  Date Value Ref Range Status  09/19/2017 103 (H) 65 - 99 mg/dL Final    Comment:    .            Fasting reference interval . For someone without known diabetes, a glucose value between 100 and 125 mg/dL is consistent with prediabetes and should be confirmed with a follow-up test. .   12/28/2016 85 65 - 99 mg/dL Final  06/20/2016 94 65 - 99 mg/dL Final      Colorectal cancer:  01/2009 Lung cancer:  Low Dose CT Chest recommended if Age 58-80 years, 30 pack-year currently smoking OR have quit w/in 15years. Patient does not qualify.   Aspirin: taking it ECG: we will hold off since we are placing referral to cardiologist    Patient Active Problem List   Diagnosis Date Noted  . Localized, primary osteoarthritis of shoulder region 12/24/2017  . Mild pulmonary hypertension ([place]) 12/24/2017  . Mild concentric left ventricular hypertrophy (LVH) 12/24/2017  . Mild aortic stenosis by prior echocardiogram 12/24/2017  . Mild aortic regurgitation 12/24/2017  . Chronic kidney disease, stage III (moderate) ([place]) 06/22/2016  . Paresthesia of arm 03/20/2016  . Uterine fibroid 01/11/2016  . PMB (postmenopausal bleeding) 01/03/2016  . Allergic rhinitis, seasonal 05/24/2015  . Benign essential HTN 05/24/2015  . Chronic constipation 05/24/2015  . Diabetes mellitus with renal manifestation ([place]) 05/24/2015  . Dyslipidemia 05/24/2015  . Goiter 05/24/2015  . Acquired hypothyroidism 05/24/2015  . Mixed incontinence 05/24/2015  . Microalbuminuria 05/24/2015  . Iron deficiency anemia 05/24/2015  . Menopause 05/24/2015  . Obstructive apnea 05/24/2015  . Adult BMI 30+ 05/24/2015  . Hypertensive retinopathy 05/24/2015  . Vitamin D deficiency 05/24/2015  . Status post total replacement of right shoulder 03/11/2015    Past Surgical History:  Procedure Laterality Date  . BREAST BIOPSY Right 90's   benign core needle   . FOOT SURGERY    . TOTAL SHOULDER REPLACEMENT Right 01/25/2015   Dr. Roland Rack    Family History  Problem Relation Age of Onset  . Heart disease Mother   . Heart disease Father   . Kidney disease Sister   . Hypertension Sister   . Diabetes Sister   . Hypertension Brother   . CVA Brother   . Breast cancer Maternal Aunt 28  . Hypertension Sister   . Kidney failure Sister   . Ovarian cancer Neg Hx   . Colon cancer Neg Hx      Social History   Socioeconomic History  . Marital status: Legally Separated    Spouse name: Not on file  . Number of children: 1  . Years of education: Not on file  . Highest education level: 12th grade  Social Needs  . Financial resource strain: Not hard at all  . Food insecurity - worry: Never true  . Food insecurity - inability: Never true  . Transportation needs - medical: No  . Transportation needs - non-medical: No  Occupational History  . Occupation: Cytogeneticist  Tobacco Use  . Smoking status: Never Smoker  . Smokeless tobacco: Never Used  Substance and Sexual Activity  . Alcohol use: No    Alcohol/week:  0.0 oz  . Drug use: No  . Sexual activity: Not Currently    Birth control/protection: Post-menopausal  Other Topics Concern  . Not on file  Social History Narrative   Separated in around 2009.   She was living with her daughter, however currently moved in with her sister and nephew , but is looking for a house.      Current Outpatient Medications:  .  ammonium lactate (LAC-HYDRIN) 12 % cream, Apply topically as needed for dry skin., Disp: 385 g, Rfl: 0 .  aspirin 81 MG tablet, Take by mouth., Disp: , Rfl:  .  Cholecalciferol (VITAMIN D3) 1000 units CAPS, Take 2 capsules by mouth daily., Disp: , Rfl:  .  Empagliflozin-Metformin HCl (SYNJARDY) 12.5-500 MG TABS, Take 1 tablet daily by mouth., Disp: 60 tablet, Rfl: 2 .  ketoconazole (NIZORAL) 2 % cream, Apply 1 application topically daily., Disp: 60 g, Rfl: 0 .  levothyroxine (SYNTHROID, LEVOTHROID) 88 MCG tablet, Take 1 tablet (88 mcg total) by mouth daily., Disp: 30 tablet, Rfl: 0 .  Multiple Vitamin (MULTIVITAMIN) tablet, Take 1 tablet by mouth daily., Disp: , Rfl:  .  Olmesartan-Amlodipine-HCTZ 40-5-25 MG TABS, Take 1 tablet daily by mouth., Disp: 90 tablet, Rfl: 1 .  rosuvastatin (CRESTOR) 5 MG tablet, Take 1 tablet (5 mg total) daily by mouth., Disp: 90 tablet, Rfl: 1 .  glucose blood (FREESTYLE  LITE) test strip, FREESTYLE LITE TEST (In Vitro Strip)  use as directed for 0 days  Quantity: 200.00;  Refills: 3   Ordered :25-Nov-2014  Vonna Kotyk ;  Started 30-Dec-2008 Active Comments: DX: 250.00, Disp: , Rfl:   No Known Allergies   ROS   Constitutional: Negative for fever or weight change.  Respiratory: Negative for cough and shortness of breath.   Cardiovascular: Negative for chest pain or palpitations.  Gastrointestinal: Negative for abdominal pain, no bowel changes.  Musculoskeletal: Negative for gait problem or joint swelling.  Skin: Negative for rash.  Neurological: Negative for dizziness or headache.  No other specific complaints in a complete review of systems (except as listed in HPI above).   Objective  Vitals:   12/24/17 0813  BP: 122/72  Pulse: 79  Resp: 16  Temp: 97.8 F (36.6 C)  TempSrc: Oral  SpO2: 98%  Weight: 230 lb 8 oz (104.6 kg)  Height: _0  (1.651 m)    Body mass index is 38.36 kg/m.  Physical Exam  Constitutional: Patient appears well-developed and obese  No distress.  HENT: Head: Normocephalic and atraumatic. Ears: B TMs ok, no erythema or effusion; Nose: Nose normal. Mouth/Throat: Oropharynx is clear and moist. No oropharyngeal exudate.  Eyes: Conjunctivae and EOM are normal. Pupils are equal, round, and reactive to light. No scleral icterus.  Neck: Normal range of motion. Neck supple. No JVD present. Large  thyromegaly present.  Cardiovascular: Normal rate, regular rhythm positive for sem 2/6 more audible on 2nd intercostal space right side. Trace BLE edema. Pulmonary/Chest: Effort normal and breath sounds normal. No respiratory distress. Abdominal: Soft. Bowel sounds are normal, no distension. There is no tenderness. no masses Breast: no lumps or masses, no nipple discharge or rashes FEMALE GENITALIA:  External genitalia normal External urethra normal Vaginal vault normal without discharge or lesions Cervix normal without  discharge or lesions Bimanual exam normal without masses RECTAL: not done Musculoskeletal: Normal range of motion, no joint effusions. No gross deformities Neurological: he is alert and oriented to person, place, and time. No cranial nerve deficit. Coordination,  balance, strength, speech and gait are normal.  Skin: Skin is warm and dry. No rash noted. No erythema.  Psychiatric: Patient has a normal mood and affect. behavior is normal. Judgment and thought content normal.   Diabetic Foot Exam: Diabetic Foot Exam - Simple   Simple Foot Form Diabetic Foot exam was performed with the following findings:  Yes 12/24/2017  9:01 AM  Visual Inspection See comments:  Yes Sensation Testing Intact to touch and monofilament testing bilaterally:  Yes Pulse Check Posterior Tibialis and Dorsalis pulse intact bilaterally:  Yes Comments Thick toenails.      PHQ2/9: Depression screen [facility] 2/9 12/24/2017 06/18/2017 01/23/2017 12/28/2016 09/25/2016  Decreased Interest 0 0 0 0 0  Down, Depressed, Hopeless 0 0 0 0 0  PHQ - 2 Score 0 0 0 0 0     Fall Risk: Fall Risk  12/24/2017 09/19/2017 06/18/2017 01/23/2017 12/28/2016  Falls in the past year? _0      Functional Status Survey: Is the patient deaf or have difficulty hearing?: No Does the patient have difficulty seeing, even when wearing glasses/contacts?: No Does the patient have difficulty concentrating, remembering, or making decisions?: No Does the patient have difficulty walking or climbing stairs?: No Does the patient have difficulty dressing or bathing?: No Does the patient have difficulty doing errands alone such as visiting a doctor's office or shopping?: No   Assessment & Plan  1. Well woman exam  Discussed importance of 150 minutes of physical activity weekly, eat two servings of fish weekly, eat one serving of tree nuts ( cashews, pistachios, pecans, almonds.Marland Kitchen) every other day, eat 6 servings of fruit/vegetables daily and drink  plenty of water and avoid sweet beverages.   2. Mild pulmonary hypertension ([place])  - Ambulatory referral to Cardiology  3. Mild concentric left ventricular hypertrophy (LVH)  - Ambulatory referral to Cardiology  4. Mild aortic stenosis by prior echocardiogram  - Ambulatory referral to Cardiology  5. Mild aortic regurgitation  - Ambulatory referral to Cardiology  6. Obstructive apnea  Explained importance of using CPAP   7. Benign essential HTN  At goal   8. Dyslipidemia  Needs to take medication more often   9. Acquired hypothyroidism  Keep follow up with Dr. Gabriel Carina   10. Chronic kidney disease, stage III (moderate) ([place])  Recheck labs next visit   11. Cervical cancer screening  - Pap IG, CT/NG NAA, and HPV (high risk)  12. Breast cancer screening  Mammogram scheduled for tomorrow   13. Routine screening for STI (sexually transmitted infection)  - HIV antibody - RPR - Hepatitis, Acute

## 2017-12-25 ENCOUNTER — Ambulatory Visit
Admission: RE | Admit: 2017-12-25 | Discharge: 2017-12-25 | Disposition: A | Discharge status: Home / Self Care | Payer: BLUE CROSS/BLUE SHIELD | Source: Ambulatory Visit | Attending: Family Medicine | Admitting: Family Medicine

## 2017-12-25 DIAGNOSIS — Z1231 Encounter for screening mammogram for malignant neoplasm of breast: Secondary | ICD-10-CM | POA: Diagnosis not present

## 2017-12-25 LAB — HIV ANTIBODY (ROUTINE TESTING W REFLEX): HIV 1&2 Ab, 4th Generation: NONREACTIVE

## 2017-12-25 LAB — RPR: RPR Ser Ql: NONREACTIVE

## 2017-12-25 LAB — HEPATITIS PANEL, ACUTE
HEP B C IGM: NONREACTIVE
HEP C AB: NONREACTIVE
Hep A IgM: NONREACTIVE
Hepatitis B Surface Ag: NONREACTIVE
SIGNAL TO CUT-OFF: 0.01 (ref <1.00)

## 2017-12-26 ENCOUNTER — Telehealth: Payer: Self-pay | Admitting: Family Medicine

## 2017-12-26 NOTE — Telephone Encounter (Signed)
Labs have not been annotated by PCP. As soon as she releases the results we will call her.

## 2017-12-26 NOTE — Telephone Encounter (Signed)
Copied from [place]. Topic: Quick Communication - Lab Results >> Dec 26, 2017  8:16 AM Robina Ade, Helene Kelp D wrote: Patient called and would like her lab results from her last visit. Also she had a missed called and was returning call back to the office. Please call patient back, thanks.

## 2017-12-28 LAB — PAP IG, CT-NG NAA, HPV HIGH-RISK
C. trachomatis RNA, TMA: NOT DETECTED
HPV DNA High Risk: NOT DETECTED
N. GONORRHOEAE RNA, TMA: NOT DETECTED

## 2018-01-08 ENCOUNTER — Encounter: Payer: Self-pay | Admitting: Family Medicine

## 2018-01-10 ENCOUNTER — Ambulatory Visit: Payer: BLUE CROSS/BLUE SHIELD | Admitting: Family Medicine

## 2018-01-31 NOTE — Progress Notes (Signed)
Cardiology Office Note  Date:  02/03/2018   ID:  AMEE BOOTHE, DOB 07/17/1956, MRN [id number]  PCP:  Steele Sizer, MD   Chief Complaint  Patient presents with  . Other    Per Dr. Ancil Boozer Mild pulmonary hypertension. Patient c/o when lying down she can not catch her breath. Meds reviewed verbally with patient.     HPI:  [name] is a 62 year old woman with past medical history of Obstructive sleep apnea Hypothyroid Hypertension Hyperlipidemia on Crestor every other day Diabetes for  Chronic renal insufficiency Morbid obesity Referred by Dr. Ancil Boozer for consultation of her aortic valve disease,prior history of pulmonary hypertension, LVH on echo  Echo 2012 mild pulmonary hypertension Mild LVH and aortic stenosis Heart murmur  Good exercise tolerance Gym 3x week Does classes Problems with her knees and legs, chronic pain  HBA1C 6.4 Total chol 208, LDL 132  Crestor: muscle pain Cuts them in 1/2  Lipitor: Does not remember the side effect Does not think she has tried Zetia  Denies any significant shortness of breath or chest pain on exertion Otherwise feels well, on her feet much of the day  EKG personally reviewed by myself on todays visit Shows normal sinus rhythm rate 73 bpm T wave abnormality V3 through V6, 1 and aVL   PMH:   has a past medical history of Allergy, Anemia, Arthritis of right shoulder region, Chronic insomnia, Diabetes mellitus without complication ([place]), Goiter, Hyperlipidemia, Hypertension, Hypertensive retinopathy of left eye, Hypothyroidism, adult, Inflammation of urethra, Menopause, Microalbuminuria, Mixed incontinence, Muscle cramps, Obesity, OSA (obstructive sleep apnea), Radiculitis, lumbosacral, and Vitamin D deficiency.  PSH:    Past Surgical History:  Procedure Laterality Date  . BREAST BIOPSY Right 90's   benign core needle   . FOOT SURGERY    . TOTAL SHOULDER REPLACEMENT Right 01/25/2015   Dr. Roland Rack    Current Outpatient  Medications  Medication Sig Dispense Refill  . ammonium lactate (LAC-HYDRIN) 12 % cream Apply topically as needed for dry skin. 385 g 0  . aspirin 81 MG tablet Take by mouth.    . Cholecalciferol (VITAMIN D3) 1000 units CAPS Take 2 capsules by mouth daily.    . Empagliflozin-Metformin HCl (SYNJARDY) 12.5-500 MG TABS Take 1 tablet daily by mouth. 60 tablet 2  . glucose blood (FREESTYLE LITE) test strip FREESTYLE LITE TEST (In Vitro Strip)  use as directed for 0 days  Quantity: 200.00;  Refills: 3   Ordered :25-Nov-2014  Vonna Kotyk ;  Started 30-Dec-2008 Active Comments: DX: 250.00    . ketoconazole (NIZORAL) 2 % cream Apply 1 application topically daily. 60 g 0  . levothyroxine (SYNTHROID, LEVOTHROID) 75 MCG tablet Take 75 mcg by mouth daily before breakfast.    . Multiple Vitamin (MULTIVITAMIN) tablet Take 1 tablet by mouth daily.    . Olmesartan-Amlodipine-HCTZ 40-5-25 MG TABS Take 1 tablet daily by mouth. 90 tablet 1  . rosuvastatin (CRESTOR) 5 MG tablet Take 1 tablet (5 mg total) daily by mouth. 90 tablet 1   No current facility-administered medications for this visit.      Allergies:   Patient has no known allergies.   Social History:  The patient  reports that she has never smoked. She has never used smokeless tobacco. She reports that she does not drink alcohol or use drugs.   Family History:   family history includes Breast cancer (age of onset: 49) in her maternal aunt; CVA in her brother; Diabetes in her sister; Heart disease  in her father and mother; Hypertension in her brother, sister, and sister; Kidney disease in her sister; Kidney failure in her sister.    Review of Systems: Review of Systems  Constitutional: Negative.   Respiratory: Negative.   Cardiovascular: Negative.   Gastrointestinal: Negative.   Musculoskeletal: Positive for joint pain.  Neurological: Negative.   Psychiatric/Behavioral: Negative.   All other systems reviewed and are  negative.    PHYSICAL EXAM: VS:  BP (!) 150/76 (BP Location: Right Arm, Patient Position: Sitting, Cuff Size: Normal)   Pulse 73   Ht 5\' 5"  (1.651 m)   Wt 231 lb (104.8 kg)   BMI 38.44 kg/m  , BMI Body mass index is 38.44 kg/m. GEN: Well nourished, well developed, in no acute distress  HEENT: normal  Neck: no JVD, carotid bruits, or masses Cardiac: RRR; no murmurs, rubs, or gallops,no edema  Respiratory:  clear to auscultation bilaterally, normal work of breathing GI: soft, nontender, nondistended, + BS MS: no deformity or atrophy  Skin: warm and dry, no rash Neuro:  Strength and sensation are intact Psych: euthymic mood, full affect    Recent Labs: 09/19/2017: ALT 16; BUN 16; Creat 1.07; Hemoglobin 12.7; Platelets 360; Potassium 3.7; Sodium 142; TSH 0.60    Lipid Panel Lab Results  Component Value Date   CHOL 208 (H) 09/19/2017   HDL 57 09/19/2017   LDLCALC 132 (H) 09/19/2017   TRIG 87 09/19/2017      Wt Readings from Last 3 Encounters:  02/03/18 231 lb (104.8 kg)  12/24/17 230 lb 8 oz (104.6 kg)  09/19/17 232 lb 11.2 oz (105.6 kg)       ASSESSMENT AND PLAN:  Mild pulmonary hypertension (HCC) Not particularly elevated on echocardiogram 2012 Denies shortness of breath, repeat echocardiogram ordered  Mild aortic stenosis by prior echocardiogram - Plan: EKG 12-Lead Murmur appreciated, high suspicion for bicuspid aortic valve Was not well-visualized 2012 Repeat echocardiogram ordered  Mild aortic regurgitation - Plan: EKG 12-Lead Echocardiogram as above  Type 2 diabetes mellitus with diabetic nephropathy, without long-term current use of insulin (HCC) Hemoglobin A1c well controlled Exercises 3 days a week  Benign essential HTN Blood pressure initially elevated 606 systolic, we will recheck Recommended she monitor blood pressure at home  Mild concentric left ventricular hypertrophy (LVH) Stressed importance of aggressive cholesterol management She  will monitor  Dyslipidemia Discussed various strategies with her cholesterol Problems with Crestor even on 2.5 daily having leg pain She does not remember side effects on the Lipitor but is willing to retry We will start Lipitor 10 with Zetia  Abnormal EKG Curiously has nonspecific T wave abnormality anterolateral leads Denies any symptoms concerning for angina No prior EKG available for review Echocardiogram pending Discussed anginal symptoms If she does have symptoms we would order stress testing  Disposition:   F/U  12 months  Patient seen in consultation for Dr. Ancil Boozer and will be referred back to her office for ongoing care of the issues detailed above   Total encounter time more than 60 minutes  Greater than 50% was spent in counseling and coordination of care with the patient    Orders Placed This Encounter  Procedures  . EKG 12-Lead     Signed, Esmond Plants, M.D., Ph.D. 02/03/2018  [place], [place]

## 2018-02-03 ENCOUNTER — Ambulatory Visit (INDEPENDENT_AMBULATORY_CARE_PROVIDER_SITE_OTHER): Payer: BLUE CROSS/BLUE SHIELD | Admitting: Cardiovascular Disease

## 2018-02-03 ENCOUNTER — Encounter: Payer: Self-pay | Admitting: Cardiovascular Disease

## 2018-02-03 VITALS — BP 150/76 | HR 73 | Ht 65.0 in | Wt 231.0 lb

## 2018-02-03 DIAGNOSIS — I272 Pulmonary hypertension, unspecified: Secondary | ICD-10-CM

## 2018-02-03 DIAGNOSIS — I351 Nonrheumatic aortic (valve) insufficiency: Secondary | ICD-10-CM | POA: Diagnosis not present

## 2018-02-03 DIAGNOSIS — E1121 Type 2 diabetes mellitus with diabetic nephropathy: Secondary | ICD-10-CM | POA: Diagnosis not present

## 2018-02-03 DIAGNOSIS — I10 Essential (primary) hypertension: Secondary | ICD-10-CM

## 2018-02-03 DIAGNOSIS — I350 Nonrheumatic aortic (valve) stenosis: Secondary | ICD-10-CM

## 2018-02-03 DIAGNOSIS — E785 Hyperlipidemia, unspecified: Secondary | ICD-10-CM

## 2018-02-03 DIAGNOSIS — I517 Cardiomegaly: Secondary | ICD-10-CM

## 2018-02-03 MED ORDER — ATORVASTATIN CALCIUM 10 MG PO TABS: 10.0000 mg | ORAL_TABLET | Freq: Every day | ORAL | 3 refills | Status: DC

## 2018-02-03 MED ORDER — EZETIMIBE 10 MG PO TABS: 10.0000 mg | ORAL_TABLET | Freq: Every day | ORAL | 3 refills | Status: DC

## 2018-02-03 NOTE — Patient Instructions (Addendum)
Medication Instructions:  Your physician has recommended you make the following change in your medication:  1. START Lipitor (Atorvastatin) 10 mg once daily (Cholesterol medication) 2. START Zetia (Ezetimibe) 10 mg once daily (Cholesterol medication)  3. STOP Crestor    Labwork:  No new labs needed  Testing/Procedures: Your physician has requested that you have an echocardiogram. Echocardiography is a painless test that uses sound waves to create images of your heart. It provides your doctor with information about the size and shape of your heart and how well your heart's chambers and valves are working. This procedure takes approximately one hour. There are no restrictions for this procedure.    Follow-Up: It was a pleasure seeing you in the office today. Please call us if you have new issues that need to be addressed before your next appt.  [phone]  Your physician wants you to follow-up in: 12 months.  You will receive a reminder letter in the mail two months in advance. If you don't receive a letter, please call our office to schedule the follow-up appointment.  If you need a refill on your cardiac medications before your next appointment, please call your pharmacy.  For educational health videos Log in to : [email or web address] Or : [email or web address], password : [email or web address]

## 2018-02-21 ENCOUNTER — Ambulatory Visit (INDEPENDENT_AMBULATORY_CARE_PROVIDER_SITE_OTHER): Payer: BLUE CROSS/BLUE SHIELD

## 2018-02-21 ENCOUNTER — Other Ambulatory Visit: Payer: Self-pay

## 2018-02-21 DIAGNOSIS — I351 Nonrheumatic aortic (valve) insufficiency: Secondary | ICD-10-CM

## 2018-02-21 DIAGNOSIS — I350 Nonrheumatic aortic (valve) stenosis: Secondary | ICD-10-CM | POA: Diagnosis not present

## 2018-03-26 ENCOUNTER — Encounter: Payer: Self-pay | Admitting: Family Medicine

## 2018-03-26 ENCOUNTER — Ambulatory Visit (INDEPENDENT_AMBULATORY_CARE_PROVIDER_SITE_OTHER): Payer: BLUE CROSS/BLUE SHIELD | Admitting: Family Medicine

## 2018-03-26 VITALS — BP 118/66 | HR 78 | Temp 98.2°F | Resp 16 | Ht 65.0 in | Wt 229.1 lb

## 2018-03-26 DIAGNOSIS — E039 Hypothyroidism, unspecified: Secondary | ICD-10-CM

## 2018-03-26 DIAGNOSIS — I119 Hypertensive heart disease without heart failure: Secondary | ICD-10-CM

## 2018-03-26 DIAGNOSIS — E785 Hyperlipidemia, unspecified: Secondary | ICD-10-CM

## 2018-03-26 DIAGNOSIS — Z23 Encounter for immunization: Secondary | ICD-10-CM

## 2018-03-26 DIAGNOSIS — G4733 Obstructive sleep apnea (adult) (pediatric): Secondary | ICD-10-CM

## 2018-03-26 DIAGNOSIS — N181 Chronic kidney disease, stage 1: Secondary | ICD-10-CM

## 2018-03-26 DIAGNOSIS — H35039 Hypertensive retinopathy, unspecified eye: Secondary | ICD-10-CM | POA: Diagnosis not present

## 2018-03-26 DIAGNOSIS — R809 Proteinuria, unspecified: Secondary | ICD-10-CM | POA: Diagnosis not present

## 2018-03-26 DIAGNOSIS — E1121 Type 2 diabetes mellitus with diabetic nephropathy: Secondary | ICD-10-CM

## 2018-03-26 DIAGNOSIS — I517 Cardiomegaly: Secondary | ICD-10-CM | POA: Diagnosis not present

## 2018-03-26 LAB — POCT GLYCOSYLATED HEMOGLOBIN (HGB A1C): Hemoglobin A1C: 7.1

## 2018-03-26 LAB — POCT UA - MICROALBUMIN: MICROALBUMIN (UR) POC: 20 mg/L

## 2018-03-26 MED ORDER — EMPAGLIFLOZIN-METFORMIN HCL ER 25-1000 MG PO TB24: 1.0000 | ORAL_TABLET | Freq: Every day | ORAL | 1 refills | Status: DC

## 2018-03-26 MED ORDER — OLMESARTAN-AMLODIPINE-HCTZ 40-5-25 MG PO TABS: 1.0000 | ORAL_TABLET | Freq: Every day | ORAL | 1 refills | Status: DC

## 2018-03-26 NOTE — Progress Notes (Signed)
Name: Andrea Randolph   MRN: [id number]    DOB: 17-Jul-1956   Date:03/26/2018       Progress Note  Subjective  Chief Complaint  Chief Complaint  Patient presents with  . Medication Refill  . Diabetes    Does not check BS at home  . Hypertension    Denies any symptoms  . Hyperlipidemia  . Sleep Apnea    HPI  Abnormal echo: she had an echo done in 2012 that showed mild pulmonary hypertension, also mild LVH and aortic stenosis. She states had a heart murmur all her life. She denies chest tightness, no decrease in exercise tolerance - able to do two exercises classes back to back - like zumba and belly dancing. She denies orthopenea, she was seen by Dr. Rockey Situ and had repeat Echo and EF 60-65% and negative for pulmonary hypertension, but she has LVH.  OSA: she has not been compliant with CPAP machine, she wakes up gasping for air intermittently. Last time she had machine one was for shoulder surgery in 2016. She refuses to go back on it  Hypothyroidism: she has a goiter, and is under the care of Dr. Gabriel Carina, last TSH was normal   HTN: she is compliant with bp medication and bp has been at goal, no chest pain or palpitation, bp was high during Dr. Donivan Scull visit, but has been controlled today and also at home - 120's/80's   Dyslipidemia: she is off Crestor because of muscle cramps, Dr. Rockey Situ gave her Atorvastatin 10 mg and Zetia 10 mg and she is tolerating medication well, we will recheck labs.   DM:  Eye exam is up to date, negative for DM retinopathy, she is taking synjardi 12.5/500 once daily and hgbA1C has gone up from 6.4% to 7.1%, she is still exercising and eating healthy, we will adjust dose of medication. No side effects of medication. Denies polyphagia, polydipsia or polyuria. Urine micro negative today, she is on ARB   CKI: last labs were stable. recheck today on ARB   Patient Active Problem List   Diagnosis Date Noted  . Localized, primary osteoarthritis of shoulder region  12/24/2017  . Mild concentric left ventricular hypertrophy (LVH) 12/24/2017  . Mild aortic stenosis by prior echocardiogram 12/24/2017  . Mild aortic regurgitation 12/24/2017  . CKD (chronic kidney disease), stage I 06/22/2016  . Paresthesia of arm 03/20/2016  . Uterine fibroid 01/11/2016  . PMB (postmenopausal bleeding) 01/03/2016  . Allergic rhinitis, seasonal 05/24/2015  . Benign essential HTN 05/24/2015  . Chronic constipation 05/24/2015  . Diabetes mellitus with renal manifestation ([place]) 05/24/2015  . Dyslipidemia 05/24/2015  . Goiter 05/24/2015  . Acquired hypothyroidism 05/24/2015  . Mixed incontinence 05/24/2015  . Microalbuminuria 05/24/2015  . Menopause 05/24/2015  . Obstructive apnea 05/24/2015  . Adult BMI 30+ 05/24/2015  . Hypertensive retinopathy 05/24/2015  . Vitamin D deficiency 05/24/2015  . Status post total replacement of right shoulder 03/11/2015    Past Surgical History:  Procedure Laterality Date  . BREAST BIOPSY Right 90's   benign core needle   . FOOT SURGERY    . TOTAL SHOULDER REPLACEMENT Right 01/25/2015   Dr. Roland Rack    Family History  Problem Relation Age of Onset  . Heart disease Mother   . Heart disease Father   . Kidney disease Sister   . Hypertension Sister   . Diabetes Sister   . Hypertension Brother   . CVA Brother   . Breast cancer Maternal Aunt 62  .  Hypertension Sister   . Kidney failure Sister   . Ovarian cancer Neg Hx   . Colon cancer Neg Hx     Social History   Socioeconomic History  . Marital status: Legally Separated    Spouse name: Not on file  . Number of children: 1  . Years of education: Not on file  . Highest education level: 12th grade  Occupational History  . Occupation: Cytogeneticist  Social Needs  . Financial resource strain: Not hard at all  . Food insecurity:    Worry: Never true    Inability: Never true  . Transportation needs:    Medical: No    Non-medical: No  Tobacco Use  . Smoking  status: Never Smoker  . Smokeless tobacco: Never Used  Substance and Sexual Activity  . Alcohol use: No    Alcohol/week: 0.0 oz  . Drug use: No  . Sexual activity: Not Currently    Birth control/protection: Post-menopausal  Lifestyle  . Physical activity:    Days per week: 3 days    Minutes per session: 120 min  . Stress: Not at all  Relationships  . Social connections:    Talks on phone: More than three times a week    Gets together: More than three times a week    Attends religious service: More than 4 times per year    Active member of club or organization: Yes    Attends meetings of clubs or organizations: More than 4 times per year    Relationship status: Separated  . Intimate partner violence:    Fear of current or ex partner: No    Emotionally abused: No    Physically abused: No    Forced sexual activity: No  Other Topics Concern  . Not on file  Social History Narrative   Separated in around 2009.   She was living with her daughter, however currently moved in with her sister and nephew , but is looking for a house.      Current Outpatient Medications:  .  ammonium lactate (LAC-HYDRIN) 12 % cream, Apply topically as needed for dry skin., Disp: 385 g, Rfl: 0 .  aspirin 81 MG tablet, Take by mouth., Disp: , Rfl:  .  atorvastatin (LIPITOR) 10 MG tablet, Take 1 tablet (10 mg total) by mouth daily., Disp: 90 tablet, Rfl: 3 .  Cholecalciferol (VITAMIN D3) 1000 units CAPS, Take 2 capsules by mouth daily., Disp: , Rfl:  .  ezetimibe (ZETIA) 10 MG tablet, Take 1 tablet (10 mg total) by mouth daily., Disp: 90 tablet, Rfl: 3 .  glucose blood (FREESTYLE LITE) test strip, FREESTYLE LITE TEST (In Vitro Strip)  use as directed for 0 days  Quantity: 200.00;  Refills: 3   Ordered :25-Nov-2014  Vonna Kotyk ;  Started 30-Dec-2008 Active Comments: DX: 250.00, Disp: , Rfl:  .  ketoconazole (NIZORAL) 2 % cream, Apply 1 application topically daily., Disp: 60 g, Rfl: 0 .  levothyroxine  (SYNTHROID, LEVOTHROID) 75 MCG tablet, Take 75 mcg by mouth daily before breakfast., Disp: , Rfl:  .  Multiple Vitamin (MULTIVITAMIN) tablet, Take 1 tablet by mouth daily., Disp: , Rfl:  .  Olmesartan-amLODIPine-HCTZ 40-5-25 MG TABS, Take 1 tablet by mouth daily., Disp: 90 tablet, Rfl: 1 .  Empagliflozin-metFORMIN HCl ER (SYNJARDY XR) 25-1000 MG TB24, Take 1 tablet by mouth daily., Disp: 90 tablet, Rfl: 1  No Known Allergies   ROS  Constitutional: Negative for fever or weight change.  Respiratory:  Negative for cough and shortness of breath.   Cardiovascular: Negative for chest pain or palpitations.  Gastrointestinal: Negative for abdominal pain, no bowel changes.  Musculoskeletal: Negative for gait problem or joint swelling.  Skin: Negative for rash.  Neurological: Negative for dizziness or headache.  No other specific complaints in a complete review of systems (except as listed in HPI above).   Objective  Vitals:   03/26/18 0930  BP: 118/66  Pulse: 78  Resp: 16  Temp: 98.2 F (36.8 C)  TempSrc: Oral  SpO2: 96%  Weight: 229 lb 1.6 oz (103.9 kg)  Height: 5\' 5"  (1.651 m)    Body mass index is 38.12 kg/m.  Physical Exam  Constitutional: Patient appears well-developed and well-nourished. Obese  No distress.  HEENT: head atraumatic, normocephalic, pupils equal and reactive to light,  neck supple, throat within normal limits, goiter present  Cardiovascular: Normal rate, regular rhythm and normal heart sounds.  Positive for 2/6 Systolic murmur heard. No BLE edema. Pulmonary/Chest: Effort normal and breath sounds normal. No respiratory distress. Abdominal: Soft.  There is no tenderness. Psychiatric: Patient has a normal mood and affect. behavior is normal. Judgment and thought content normal.  Recent Results (from the past 2160 hour(s))  POCT UA - Microalbumin     Status: Normal   Collection Time: 03/26/18  9:31 AM  Result Value Ref Range   Microalbumin Ur, POC 20 mg/L    Creatinine, POC  mg/dL   Albumin/Creatinine Ratio, Urine, POC    POCT HgB A1C     Status: Abnormal   Collection Time: 03/26/18  9:36 AM  Result Value Ref Range   Hemoglobin A1C 7.1       PHQ2/9: Depression screen [facility] 2/9 03/26/2018 12/24/2017 06/18/2017 01/23/2017 12/28/2016  Decreased Interest 0 0 0 0 0  Down, Depressed, Hopeless 0 0 0 0 0  PHQ - 2 Score 0 0 0 0 0     Fall Risk: Fall Risk  03/26/2018 12/24/2017 09/19/2017 06/18/2017 01/23/2017  Falls in the past year? No No No No No    Functional Status Survey: Is the patient deaf or have difficulty hearing?: No Does the patient have difficulty seeing, even when wearing glasses/contacts?: Yes Does the patient have difficulty concentrating, remembering, or making decisions?: No Does the patient have difficulty walking or climbing stairs?: No Does the patient have difficulty dressing or bathing?: No Does the patient have difficulty doing errands alone such as visiting a doctor's office or shopping?: No    Assessment & Plan  1. Type 2 diabetes mellitus with diabetic nephropathy, without long-term current use of insulin (HCC)  - POCT HgB A1C 7.1% - POCT UA - Microalbumin -20 - Empagliflozin-metFORMIN HCl ER (SYNJARDY XR) 25-1000 MG TB24; Take 1 tablet by mouth daily.  Dispense: 90 tablet; Refill: 1 We will increase dose and give her ER formulation and voucher  2. Need for shingles vaccine  - Varicella-zoster vaccine IM  3. Hypertensive left ventricular hypertrophy, without heart failure  - Olmesartan-amLODIPine-HCTZ 40-5-25 MG TABS; Take 1 tablet by mouth daily.  Dispense: 90 tablet; Refill: 1 - COMPLETE METABOLIC PANEL WITH GFR  4. Mild concentric left ventricular hypertrophy (LVH)  Recent echo showed normal EF  5. Microalbuminuria  Resolved, but on ARB  6. CKD (chronic kidney disease), stage I  Used to be CKI stage III but back to normal , continue ARB  7. Obstructive apnea   8. Acquired hypothyroidism  Continue  follow up with Endo   9. Hypertensive  retinopathy, unspecified laterality   10. Dyslipidemia  - Lipid panel

## 2018-04-03 LAB — COMPLETE METABOLIC PANEL WITH GFR
AG Ratio: 1.4 (calc) (ref 1.0–2.5)
ALBUMIN MSPROF: 4 g/dL (ref 3.6–5.1)
ALKALINE PHOSPHATASE (APISO): 50 U/L (ref 33–130)
ALT: 23 U/L (ref 6–29)
AST: 18 U/L (ref 10–35)
BILIRUBIN TOTAL: 0.4 mg/dL (ref 0.2–1.2)
BUN / CREAT RATIO: 15 (calc) (ref 6–22)
BUN: 15 mg/dL (ref 7–25)
CHLORIDE: 103 mmol/L (ref 98–110)
CO2: 29 mmol/L (ref 20–32)
CREATININE: 1.01 mg/dL — AB (ref 0.50–0.99)
Calcium: 9.7 mg/dL (ref 8.6–10.4)
GFR, Est African American: 70 mL/min/{1.73_m2} (ref >=60)
GFR, Est Non African American: 60 mL/min/{1.73_m2} (ref >=60)
GLUCOSE: 103 mg/dL — AB (ref 65–99)
Globulin: 2.9 g/dL (calc) (ref 1.9–3.7)
Potassium: 3.8 mmol/L (ref 3.5–5.3)
Sodium: 141 mmol/L (ref 135–146)
Total Protein: 6.9 g/dL (ref 6.1–8.1)

## 2018-04-03 LAB — LIPID PANEL
CHOLESTEROL: 153 mg/dL (ref <200)
HDL: 44 mg/dL — AB (ref >50)
LDL CHOLESTEROL (CALC): 94 mg/dL
Non-HDL Cholesterol (Calc): 109 mg/dL (calc) (ref <130)
Total CHOL/HDL Ratio: 3.5 (calc) (ref <5.0)
Triglycerides: 60 mg/dL (ref <150)

## 2018-04-15 ENCOUNTER — Telehealth: Payer: Self-pay | Admitting: Family Medicine

## 2018-04-15 NOTE — Telephone Encounter (Signed)
Copied from [place]. Topic: Quick Communication - See Telephone Encounter >> Apr 15, 2018  4:53 PM Vernona Rieger wrote: CRM for notification. See Telephone encounter for: 04/15/18.  Patient is requesting a savings card for her Olmesartan-amLODIPine-HCTZ 40-5-25 MG TABS. Please call patient @ [phone]

## 2018-04-16 NOTE — Telephone Encounter (Signed)
Spoke with patient and sent her the [employer] by email that states she can receive it at [employer] with the online discount card for $57 a month.

## 2018-05-19 ENCOUNTER — Other Ambulatory Visit: Payer: Self-pay

## 2018-05-19 ENCOUNTER — Other Ambulatory Visit: Payer: Self-pay | Admitting: *Deleted

## 2018-05-19 DIAGNOSIS — E1121 Type 2 diabetes mellitus with diabetic nephropathy: Secondary | ICD-10-CM

## 2018-05-19 MED ORDER — ATORVASTATIN CALCIUM 10 MG PO TABS: 10.0000 mg | ORAL_TABLET | Freq: Every day | ORAL | 2 refills | Status: DC

## 2018-05-19 MED ORDER — EMPAGLIFLOZIN-METFORMIN HCL ER 25-1000 MG PO TB24: 1.0000 | ORAL_TABLET | Freq: Every day | ORAL | 0 refills | Status: DC

## 2018-05-19 MED ORDER — EZETIMIBE 10 MG PO TABS: 10.0000 mg | ORAL_TABLET | Freq: Every day | ORAL | 2 refills | Status: DC

## 2018-05-19 NOTE — Telephone Encounter (Signed)
Refill request was sent to Dr. Krichna Sowles for approval and submission.  

## 2018-05-21 ENCOUNTER — Other Ambulatory Visit: Payer: Self-pay | Admitting: *Deleted

## 2018-05-21 MED ORDER — EZETIMIBE 10 MG PO TABS: 10.0000 mg | ORAL_TABLET | Freq: Every day | ORAL | 2 refills | Status: DC

## 2018-05-21 MED ORDER — ATORVASTATIN CALCIUM 10 MG PO TABS: 10.0000 mg | ORAL_TABLET | Freq: Every day | ORAL | 2 refills | Status: DC

## 2018-05-27 ENCOUNTER — Other Ambulatory Visit: Payer: Self-pay

## 2018-05-27 DIAGNOSIS — E1121 Type 2 diabetes mellitus with diabetic nephropathy: Secondary | ICD-10-CM

## 2018-05-27 NOTE — Telephone Encounter (Signed)
Refill request was sent to Dr. Krichna Sowles for approval and submission.  

## 2018-05-28 MED ORDER — EMPAGLIFLOZIN-METFORMIN HCL ER 25-1000 MG PO TB24: 1.0000 | ORAL_TABLET | Freq: Every day | ORAL | 0 refills | Status: DC

## 2018-05-30 ENCOUNTER — Other Ambulatory Visit: Payer: Self-pay

## 2018-05-30 DIAGNOSIS — I119 Hypertensive heart disease without heart failure: Secondary | ICD-10-CM

## 2018-05-30 MED ORDER — OLMESARTAN-AMLODIPINE-HCTZ 40-5-25 MG PO TABS: 1.0000 | ORAL_TABLET | Freq: Every day | ORAL | 1 refills | Status: DC

## 2018-05-30 NOTE — Telephone Encounter (Signed)
Refill request was sent to Dr. Krichna Sowles for approval and submission.  

## 2018-06-04 ENCOUNTER — Other Ambulatory Visit: Payer: Self-pay | Admitting: Family Medicine

## 2018-06-04 DIAGNOSIS — I119 Hypertensive heart disease without heart failure: Secondary | ICD-10-CM

## 2018-06-04 NOTE — Telephone Encounter (Signed)
Copied from [place] [phone]. Topic: Quick Communication - Rx Refill/Question >> Jun 04, 2018  4:44 PM Cecelia Byars, NT wrote: Medication: Olmesartan-amLODIPine-HCTZ 40-5-25 MG TABS  Has the patient contacted their pharmacy? yes  (Agent: If no, request that the patient contact the pharmacy for the refill. (Agent: If yes, when and what did the pharmacy advise?)  Preferred Pharmacy (with phone number or street name [facility], [place] [phone] (Phone) [phone] (Fax)   Patient would like this sent to the above pharmacy due to cost ,she would also like a 90 day supply   Agent: Please be advised that RX refills may take up to 3 business days. We ask that you follow-up with your pharmacy.

## 2018-06-06 MED ORDER — OLMESARTAN-AMLODIPINE-HCTZ 40-5-25 MG PO TABS: 1.0000 | ORAL_TABLET | Freq: Every day | ORAL | 0 refills | Status: DC

## 2018-06-06 NOTE — Telephone Encounter (Signed)
Patient request 90 day Rx sent to mail order pharmacy.

## 2018-06-26 ENCOUNTER — Ambulatory Visit (INDEPENDENT_AMBULATORY_CARE_PROVIDER_SITE_OTHER): Payer: BLUE CROSS/BLUE SHIELD | Admitting: Family Medicine

## 2018-06-26 ENCOUNTER — Encounter: Payer: Self-pay | Admitting: Family Medicine

## 2018-06-26 VITALS — BP 108/76 | HR 65 | Temp 98.3°F | Resp 16 | Ht 65.0 in | Wt 223.9 lb

## 2018-06-26 DIAGNOSIS — E559 Vitamin D deficiency, unspecified: Secondary | ICD-10-CM

## 2018-06-26 DIAGNOSIS — I119 Hypertensive heart disease without heart failure: Secondary | ICD-10-CM

## 2018-06-26 DIAGNOSIS — E1121 Type 2 diabetes mellitus with diabetic nephropathy: Secondary | ICD-10-CM | POA: Diagnosis not present

## 2018-06-26 DIAGNOSIS — B353 Tinea pedis: Secondary | ICD-10-CM | POA: Diagnosis not present

## 2018-06-26 DIAGNOSIS — E785 Hyperlipidemia, unspecified: Secondary | ICD-10-CM

## 2018-06-26 DIAGNOSIS — H35039 Hypertensive retinopathy, unspecified eye: Secondary | ICD-10-CM

## 2018-06-26 DIAGNOSIS — N181 Chronic kidney disease, stage 1: Secondary | ICD-10-CM

## 2018-06-26 DIAGNOSIS — E039 Hypothyroidism, unspecified: Secondary | ICD-10-CM

## 2018-06-26 DIAGNOSIS — G4733 Obstructive sleep apnea (adult) (pediatric): Secondary | ICD-10-CM | POA: Diagnosis not present

## 2018-06-26 DIAGNOSIS — I10 Essential (primary) hypertension: Secondary | ICD-10-CM

## 2018-06-26 LAB — POCT GLYCOSYLATED HEMOGLOBIN (HGB A1C): HEMOGLOBIN A1C: 6.7 % (ref 4.0–5.6)

## 2018-06-26 MED ORDER — EMPAGLIFLOZIN-METFORMIN HCL ER 25-1000 MG PO TB24: 1.0000 | ORAL_TABLET | Freq: Every day | ORAL | 1 refills | Status: DC

## 2018-06-26 MED ORDER — KETOCONAZOLE 2 % EX CREA: 1.0000 "application " | TOPICAL_CREAM | Freq: Every day | CUTANEOUS | 0 refills | Status: DC

## 2018-06-26 MED ORDER — OLMESARTAN-AMLODIPINE-HCTZ 40-5-25 MG PO TABS: 1.0000 | ORAL_TABLET | Freq: Every day | ORAL | 1 refills | Status: DC

## 2018-06-26 NOTE — Progress Notes (Signed)
Name: Andrea Randolph   MRN: [id number]    DOB: 1956/10/08   Date:06/26/2018       Progress Note  Subjective  Chief Complaint  Chief Complaint  Patient presents with  . Medication Refill    3 month F/U  . Diabetes  . Hypertension  . Hypothyroidism  . Sleep Apnea  . Dyslipidemia    HPI  Abnormal echo: she had an echo done in 2012 that showed mild pulmonary hypertension, also mild LVH and aortic stenosis. She states had a heart murmur all her life. She denies chest tightness, no decrease in exercise tolerance - able to do two exercises classes back to back - like zumba and belly dancing. She denies orthopenea, she was seen by Dr. Rockey Situ and had repeat Echo 02/2018  and EF 60-65% and negative for pulmonary hypertension, but she has LVH.   OSA: she has not been compliant with CPAP machine, she wakes up gasping for air intermittently. Last time she had machine one was for shoulder surgery in 2016. Still refusing it.   Hypothyroidism: she has a goiter, and is under the care of Dr. Gabriel Carina, last TSH was normal, it was done 05/2017, she is going back to see her soon.   HTN: she is compliant with bp medication and bp has been at goal, no chest pain or palpitation, BP today is towards low end normal. No dizziness. She states bp at home is usually on the 120's/70's  Dyslipidemia: she is off Crestor because of muscle cramps, Dr. Rockey Situ gave her Atorvastatin 10 mg and Zetia 10 mg and she is tolerating medication well, last labs had improved, advised to try taking 2 pills of atorvastatin 10 mg and see if she can tolerate. Call back for a refill of 20 mg if she can take a higher dose.   DM:  Eye exam is up to date, negative for DM retinopathy, she is taking synjardi 25/1000 ER once daily and hgbA1C  Went from  6.4% to 7.1% and today is down to 6.7%  she is still exercising and eating healthy. No side effects of medication. Denies polyphagia, polydipsia or polyuria. Urine micro negative today, she is on  ARB, CKI was stage III    CKI: last labs were stable.recheck today on ARB Patient Active Problem List   Diagnosis Date Noted  . Localized, primary osteoarthritis of shoulder region 12/24/2017  . Mild concentric left ventricular hypertrophy (LVH) 12/24/2017  . Mild aortic stenosis by prior echocardiogram 12/24/2017  . Mild aortic regurgitation 12/24/2017  . CKD (chronic kidney disease), stage I 06/22/2016  . Paresthesia of arm 03/20/2016  . Uterine fibroid 01/11/2016  . PMB (postmenopausal bleeding) 01/03/2016  . Allergic rhinitis, seasonal 05/24/2015  . Benign essential HTN 05/24/2015  . Chronic constipation 05/24/2015  . Diabetes mellitus with renal manifestation ([place]) 05/24/2015  . Dyslipidemia 05/24/2015  . Goiter 05/24/2015  . Acquired hypothyroidism 05/24/2015  . Mixed incontinence 05/24/2015  . Microalbuminuria 05/24/2015  . Menopause 05/24/2015  . Obstructive apnea 05/24/2015  . Adult BMI 30+ 05/24/2015  . Hypertensive retinopathy 05/24/2015  . Vitamin D deficiency 05/24/2015  . Status post total replacement of right shoulder 03/11/2015    Past Surgical History:  Procedure Laterality Date  . BREAST BIOPSY Right 90's   benign core needle   . FOOT SURGERY    . TOTAL SHOULDER REPLACEMENT Right 01/25/2015   Dr. Roland Rack    Family History  Problem Relation Age of Onset  . Heart disease Mother   .  Heart disease Father   . Kidney disease Sister   . Hypertension Sister   . Diabetes Sister   . Hypertension Brother   . CVA Brother   . Breast cancer Maternal Aunt 28  . Hypertension Sister   . Kidney failure Sister   . Ovarian cancer Neg Hx   . Colon cancer Neg Hx     Social History   Socioeconomic History  . Marital status: Legally Separated    Spouse name: Not on file  . Number of children: 1  . Years of education: Not on file  . Highest education level: 12th grade  Occupational History  . Occupation: Cytogeneticist  Social Needs  . Financial  resource strain: Not hard at all  . Food insecurity:    Worry: Never true    Inability: Never true  . Transportation needs:    Medical: No    Non-medical: No  Tobacco Use  . Smoking status: Never Smoker  . Smokeless tobacco: Never Used  Substance and Sexual Activity  . Alcohol use: No    Alcohol/week: 0.0 standard drinks  . Drug use: No  . Sexual activity: Not Currently    Birth control/protection: Post-menopausal  Lifestyle  . Physical activity:    Days per week: 3 days    Minutes per session: 120 min  . Stress: Not at all  Relationships  . Social connections:    Talks on phone: More than three times a week    Gets together: More than three times a week    Attends religious service: More than 4 times per year    Active member of club or organization: Yes    Attends meetings of clubs or organizations: More than 4 times per year    Relationship status: Separated  . Intimate partner violence:    Fear of current or ex partner: No    Emotionally abused: No    Physically abused: No    Forced sexual activity: No  Other Topics Concern  . Not on file  Social History Narrative   Separated in around 2009.   She was living with her daughter, however currently moved in with her sister and nephew , but is looking for a house.      Current Outpatient Medications:  .  aspirin 81 MG tablet, Take by mouth., Disp: , Rfl:  .  atorvastatin (LIPITOR) 10 MG tablet, Take 1 tablet (10 mg total) by mouth daily., Disp: 90 tablet, Rfl: 2 .  Cholecalciferol (VITAMIN D3) 1000 units CAPS, Take 2 capsules by mouth daily., Disp: , Rfl:  .  Empagliflozin-metFORMIN HCl ER (SYNJARDY XR) 25-1000 MG TB24, Take 1 tablet by mouth daily., Disp: 30 tablet, Rfl: 0 .  ezetimibe (ZETIA) 10 MG tablet, Take 1 tablet (10 mg total) by mouth daily., Disp: 90 tablet, Rfl: 2 .  glucose blood (FREESTYLE LITE) test strip, FREESTYLE LITE TEST (In Vitro Strip)  use as directed for 0 days  Quantity: 200.00;  Refills: 3    Ordered :25-Nov-2014  Vonna Kotyk ;  Started 30-Dec-2008 Active Comments: DX: 250.00, Disp: , Rfl:  .  ketoconazole (NIZORAL) 2 % cream, Apply 1 application topically daily., Disp: 60 g, Rfl: 0 .  levothyroxine (SYNTHROID, LEVOTHROID) 75 MCG tablet, Take 75 mcg by mouth daily before breakfast., Disp: , Rfl:  .  Multiple Vitamin (MULTIVITAMIN) tablet, Take 1 tablet by mouth daily., Disp: , Rfl:  .  Olmesartan-amLODIPine-HCTZ 40-5-25 MG TABS, Take 1 tablet by mouth daily., Disp: 90  tablet, Rfl: 0  No Known Allergies   ROS  Constitutional: Negative for fever or weight change.  Respiratory: Negative for cough and shortness of breath.   Cardiovascular: Negative for chest pain or palpitations.  Gastrointestinal: Negative for abdominal pain, no bowel changes.  Musculoskeletal: Negative for gait problem or joint swelling.  Skin: Negative for rash.  Neurological: Negative for dizziness or headache.  No other specific complaints in a complete review of systems (except as listed in HPI above).  Objective  Vitals:   06/26/18 0903  BP: 108/76  Pulse: 65  Resp: 16  Temp: 98.3 F (36.8 C)  TempSrc: Oral  SpO2: 97%  Weight: 223 lb 14.4 oz (101.6 kg)  Height: 5\' 5"  (1.651 m)    Body mass index is 37.26 kg/m.  Physical Exam  Constitutional: Patient appears well-developed and well-nourished. Obese No distress.  HEENT: head atraumatic, normocephalic, pupils equal and reactive to light, neck supple, throat within normal limits, large goiter Cardiovascular: Normal rate, regular rhythm and normal heart sounds.  No murmur heard. No BLE edema. Pulmonary/Chest: Effort normal and breath sounds normal. No respiratory distress. Abdominal: Soft.  There is no tenderness. Psychiatric: Patient has a normal mood and affect. behavior is normal. Judgment and thought content normal. Skin: dryness on bottom of right foot  Recent Results (from the past 2160 hour(s))  POCT HgB A1C     Status: None    Collection Time: 06/26/18  9:06 AM  Result Value Ref Range   Hemoglobin A1C     HbA1c POC (<> result, manual entry) 6.7 4.0 - 5.6 %   HbA1c, POC (prediabetic range)     HbA1c, POC (controlled diabetic range)       PHQ2/9: Depression screen [facility] 2/9 06/26/2018 03/26/2018 12/24/2017 06/18/2017 01/23/2017  Decreased Interest 0 0 0 0 0  Down, Depressed, Hopeless 0 0 0 0 0  PHQ - 2 Score 0 0 0 0 0    Fall Risk: Fall Risk  06/26/2018 03/26/2018 12/24/2017 09/19/2017 06/18/2017  Falls in the past year? No No No No No     Functional Status Survey: Is the patient deaf or have difficulty hearing?: No Does the patient have difficulty seeing, even when wearing glasses/contacts?: No Does the patient have difficulty concentrating, remembering, or making decisions?: No Does the patient have difficulty walking or climbing stairs?: No Does the patient have difficulty dressing or bathing?: No Does the patient have difficulty doing errands alone such as visiting a doctor's office or shopping?: No   Assessment & Plan  1. Type 2 diabetes mellitus with diabetic nephropathy, without long-term current use of insulin (HCC)  - POCT HgB A1C - Empagliflozin-metFORMIN HCl ER (SYNJARDY XR) 25-1000 MG TB24; Take 1 tablet by mouth daily.  Dispense: 90 tablet; Refill: 1  2. Tinea pedis of right foot  - ketoconazole (NIZORAL) 2 % cream; Apply 1 application topically daily.  Dispense: 60 g; Refill: 0  3. Hypertensive left ventricular hypertrophy, without heart failure  - Olmesartan-amLODIPine-HCTZ 40-5-25 MG TABS; Take 1 tablet by mouth daily.  Dispense: 90 tablet; Refill: 1  4. Obstructive apnea  Not compliant with CPAP   5. CKD (chronic kidney disease), stage I  Improved since last visit   6. Dyslipidemia  Lipid panel improved   7. Hypertensive retinopathy, unspecified laterality  Keep regular follow ups with eye doctor   8. Acquired hypothyroidism  Go back to Dr. Gabriel Carina   9. Benign essential  HTN  Continue medication   10. Morbid  obesity (HCC)  BMI over 35 with co-morbidities She has lost 6 lbs since last visit  Discussed with the patient the risk posed by an increased BMI. Discussed importance of portion control, calorie counting and at least 150 minutes of physical activity weekly. Avoid sweet beverages and drink more water. Eat at least 6 servings of fruit and vegetables daily   11. Vitamin D deficiency

## 2018-07-04 DIAGNOSIS — E039 Hypothyroidism, unspecified: Secondary | ICD-10-CM | POA: Diagnosis not present

## 2018-07-11 DIAGNOSIS — E039 Hypothyroidism, unspecified: Secondary | ICD-10-CM | POA: Diagnosis not present

## 2018-07-11 DIAGNOSIS — E042 Nontoxic multinodular goiter: Secondary | ICD-10-CM | POA: Diagnosis not present

## 2018-07-21 DIAGNOSIS — E041 Nontoxic single thyroid nodule: Secondary | ICD-10-CM | POA: Diagnosis not present

## 2018-07-22 DIAGNOSIS — G4733 Obstructive sleep apnea (adult) (pediatric): Secondary | ICD-10-CM | POA: Diagnosis not present

## 2018-07-22 DIAGNOSIS — E7800 Pure hypercholesterolemia, unspecified: Secondary | ICD-10-CM | POA: Diagnosis not present

## 2018-07-22 DIAGNOSIS — I10 Essential (primary) hypertension: Secondary | ICD-10-CM | POA: Diagnosis not present

## 2018-07-22 DIAGNOSIS — E042 Nontoxic multinodular goiter: Secondary | ICD-10-CM | POA: Diagnosis not present

## 2018-07-22 DIAGNOSIS — E785 Hyperlipidemia, unspecified: Secondary | ICD-10-CM | POA: Diagnosis not present

## 2018-07-22 DIAGNOSIS — Z7982 Long term (current) use of aspirin: Secondary | ICD-10-CM | POA: Diagnosis not present

## 2018-07-22 DIAGNOSIS — Z79899 Other long term (current) drug therapy: Secondary | ICD-10-CM | POA: Diagnosis not present

## 2018-07-22 DIAGNOSIS — E119 Type 2 diabetes mellitus without complications: Secondary | ICD-10-CM | POA: Diagnosis not present

## 2018-07-22 DIAGNOSIS — E039 Hypothyroidism, unspecified: Secondary | ICD-10-CM | POA: Diagnosis not present

## 2018-07-22 DIAGNOSIS — Z6837 Body mass index (BMI) 37.0-37.9, adult: Secondary | ICD-10-CM | POA: Diagnosis not present

## 2018-07-22 DIAGNOSIS — E559 Vitamin D deficiency, unspecified: Secondary | ICD-10-CM | POA: Diagnosis not present

## 2018-07-22 DIAGNOSIS — E041 Nontoxic single thyroid nodule: Secondary | ICD-10-CM | POA: Diagnosis not present

## 2018-07-28 DIAGNOSIS — I10 Essential (primary) hypertension: Secondary | ICD-10-CM | POA: Diagnosis not present

## 2018-07-28 DIAGNOSIS — G4730 Sleep apnea, unspecified: Secondary | ICD-10-CM | POA: Diagnosis not present

## 2018-07-28 DIAGNOSIS — E118 Type 2 diabetes mellitus with unspecified complications: Secondary | ICD-10-CM | POA: Diagnosis not present

## 2018-07-28 DIAGNOSIS — E079 Disorder of thyroid, unspecified: Secondary | ICD-10-CM | POA: Diagnosis not present

## 2018-08-01 DIAGNOSIS — E042 Nontoxic multinodular goiter: Secondary | ICD-10-CM | POA: Diagnosis not present

## 2018-08-01 DIAGNOSIS — Z79899 Other long term (current) drug therapy: Secondary | ICD-10-CM | POA: Diagnosis not present

## 2018-08-01 DIAGNOSIS — E559 Vitamin D deficiency, unspecified: Secondary | ICD-10-CM | POA: Diagnosis not present

## 2018-08-01 DIAGNOSIS — E785 Hyperlipidemia, unspecified: Secondary | ICD-10-CM | POA: Diagnosis not present

## 2018-08-01 DIAGNOSIS — Z96611 Presence of right artificial shoulder joint: Secondary | ICD-10-CM | POA: Diagnosis not present

## 2018-08-01 DIAGNOSIS — E119 Type 2 diabetes mellitus without complications: Secondary | ICD-10-CM | POA: Diagnosis not present

## 2018-08-01 DIAGNOSIS — I10 Essential (primary) hypertension: Secondary | ICD-10-CM | POA: Diagnosis not present

## 2018-08-01 DIAGNOSIS — Z7982 Long term (current) use of aspirin: Secondary | ICD-10-CM | POA: Diagnosis not present

## 2018-08-01 DIAGNOSIS — Z7984 Long term (current) use of oral hypoglycemic drugs: Secondary | ICD-10-CM | POA: Diagnosis not present

## 2018-08-01 DIAGNOSIS — G4733 Obstructive sleep apnea (adult) (pediatric): Secondary | ICD-10-CM | POA: Diagnosis not present

## 2018-08-01 DIAGNOSIS — E039 Hypothyroidism, unspecified: Secondary | ICD-10-CM | POA: Diagnosis not present

## 2018-08-01 DIAGNOSIS — R110 Nausea: Secondary | ICD-10-CM | POA: Diagnosis not present

## 2018-08-01 HISTORY — PX: TOTAL THYROIDECTOMY: SHX2547

## 2018-08-19 DIAGNOSIS — E890 Postprocedural hypothyroidism: Secondary | ICD-10-CM | POA: Diagnosis not present

## 2018-08-19 DIAGNOSIS — Z09 Encounter for follow-up examination after completed treatment for conditions other than malignant neoplasm: Secondary | ICD-10-CM | POA: Diagnosis not present

## 2018-08-19 DIAGNOSIS — J029 Acute pharyngitis, unspecified: Secondary | ICD-10-CM | POA: Diagnosis not present

## 2018-09-29 ENCOUNTER — Ambulatory Visit (INDEPENDENT_AMBULATORY_CARE_PROVIDER_SITE_OTHER): Payer: BLUE CROSS/BLUE SHIELD | Admitting: Family Medicine

## 2018-09-29 ENCOUNTER — Encounter: Payer: Self-pay | Admitting: Family Medicine

## 2018-09-29 VITALS — BP 132/76 | HR 65 | Temp 98.7°F | Resp 16 | Ht 65.0 in | Wt 223.6 lb

## 2018-09-29 DIAGNOSIS — H35039 Hypertensive retinopathy, unspecified eye: Secondary | ICD-10-CM

## 2018-09-29 DIAGNOSIS — I119 Hypertensive heart disease without heart failure: Secondary | ICD-10-CM

## 2018-09-29 DIAGNOSIS — E785 Hyperlipidemia, unspecified: Secondary | ICD-10-CM

## 2018-09-29 DIAGNOSIS — E890 Postprocedural hypothyroidism: Secondary | ICD-10-CM

## 2018-09-29 DIAGNOSIS — E1121 Type 2 diabetes mellitus with diabetic nephropathy: Secondary | ICD-10-CM

## 2018-09-29 DIAGNOSIS — Z23 Encounter for immunization: Secondary | ICD-10-CM | POA: Diagnosis not present

## 2018-09-29 DIAGNOSIS — N181 Chronic kidney disease, stage 1: Secondary | ICD-10-CM

## 2018-09-29 DIAGNOSIS — I350 Nonrheumatic aortic (valve) stenosis: Secondary | ICD-10-CM

## 2018-09-29 DIAGNOSIS — G4709 Other insomnia: Secondary | ICD-10-CM

## 2018-09-29 DIAGNOSIS — I10 Essential (primary) hypertension: Secondary | ICD-10-CM | POA: Diagnosis not present

## 2018-09-29 DIAGNOSIS — Z6835 Body mass index (BMI) 35.0-35.9, adult: Secondary | ICD-10-CM

## 2018-09-29 LAB — POCT GLYCOSYLATED HEMOGLOBIN (HGB A1C): Hemoglobin A1C: 6.6 % — AB (ref 4.0–5.6)

## 2018-09-29 MED ORDER — SEMAGLUTIDE 7 MG PO TABS: 1.0000 | ORAL_TABLET | Freq: Every day | ORAL | 1 refills | Status: DC

## 2018-09-29 MED ORDER — ATORVASTATIN CALCIUM 20 MG PO TABS: 20.0000 mg | ORAL_TABLET | Freq: Every day | ORAL | 1 refills | Status: DC

## 2018-09-29 MED ORDER — TRAZODONE HCL 50 MG PO TABS: 25.0000 mg | ORAL_TABLET | Freq: Every evening | ORAL | 0 refills | Status: DC | PRN

## 2018-09-29 MED ORDER — SEMAGLUTIDE 3 MG PO TABS: 1.0000 | ORAL_TABLET | Freq: Every day | ORAL | 0 refills | Status: DC

## 2018-09-29 NOTE — Progress Notes (Signed)
Name: Andrea Randolph   MRN: [id number]    DOB: [birth year]   Date:09/29/2018       Progress Note  Subjective  Chief Complaint  Chief Complaint  Patient presents with  . Follow-up    3 mth f/u  . Diabetes  . Hypertension  . Apnea  . Chronic Kidney Disease  . Dyslipidemia  . Obesity  . Labs Only  . Immunizations    flu shot  . Hypothyroidism    patient had surgery in September to remove her thyroid    HPI  Abnormal echo: she had an echo done in 2012 that showed mild pulmonary hypertension, also mild LVH and aortic stenosis. She states had a heart murmur all her life. She denies chest tightness, no decrease in exercise tolerance - able to do two exercises classes back to back - like zumba and belly dancing. She denies orthopenea, no PND, she was seen by Dr. Rockey Situ and had repeat Echo 02/2018  and EF 60-65% and negative for pulmonary hypertension, she has a history of OSA but states not compliant with CPAP machine, but she still has LVH.   Insomnia: she states she goes to the gym at night and has difficulty unwinding. Going to bed around 10-11 and she states it is taking over 30 minutes to fall asleep, explained that we can try Trazodone but needs to wear CPAP machine   OSA: she has not been compliant with CPAP machine, she wakes up gasping for air intermittently. Last time she had machine one was for shoulder surgery in 2016.Still refusing it, advised to wear it again with Trazodone.   Hypothyroidism: she had a goiter, and is under the care of Dr. Gabriel Carina, had thyroidectomy by Dr. Maudie Mercury at [facility] and is doing well since, on levothyroxine 100 mcg daily   HTN: she is compliant with bp medication and bp has been at goal, no chest pain or palpitation, BP today is at goal today.  No dizziness. . She states bp at home is usually on the 120's/80's  Dyslipidemia: she is off Crestor because of muscle cramps, Dr. Rockey Situ gave her Atorvastatin 10 mg and Zetia 10 mg and she is tolerating medication  well, last labs had improved. She tried to take 20 mg of atorvastatin now ( doubling the dose and doing well) I will change dose to 20 mg  DM:Eye exam is up to date, negative for DM retinopathy, she is taking synjardi 25/1000 ER once daily and hgbA1C  Went from  6.4% to 7.1% ,6.7% , today was 6.6%  She is still exercising and eating healthy. No side effects of medication. Denies polyphagia, polydipsia or polyuria. She is on ARB, CKI was stage III , but improved to CKI I. We will add Rybelsus today , discussed possible side effects of medication   CKI: last GFR was normal. We will recheck urine micro yearly    Patient Active Problem List   Diagnosis Date Noted  . Mild concentric left ventricular hypertrophy (LVH) 12/24/2017  . Mild aortic stenosis by prior echocardiogram 12/24/2017  . Mild aortic regurgitation 12/24/2017  . CKD (chronic kidney disease), stage I 06/22/2016  . Uterine fibroid 01/11/2016  . Allergic rhinitis, seasonal 05/24/2015  . Benign essential HTN 05/24/2015  . Chronic constipation 05/24/2015  . Diabetes mellitus with renal manifestation ([place]) 05/24/2015  . Dyslipidemia 05/24/2015  . Post-surgical hypothyroidism 05/24/2015  . Mixed incontinence 05/24/2015  . Microalbuminuria 05/24/2015  . Menopause 05/24/2015  . Obstructive apnea 05/24/2015  .  Adult BMI 30+ 05/24/2015  . Hypertensive retinopathy 05/24/2015  . Vitamin D deficiency 05/24/2015  . Status post total replacement of right shoulder 03/11/2015    Past Surgical History:  Procedure Laterality Date  . BREAST BIOPSY Right 90's   benign core needle   . FOOT SURGERY    . TOTAL SHOULDER REPLACEMENT Right 01/25/2015   Dr. Roland Rack  . TOTAL THYROIDECTOMY  08/01/2018    Family History  Problem Relation Age of Onset  . Heart disease Mother   . Heart disease Father   . Kidney disease Sister   . Hypertension Sister   . Diabetes Sister   . Hypertension Brother   . CVA Brother   . Breast cancer Maternal  Aunt 37  . Hypertension Sister   . Kidney failure Sister   . Ovarian cancer Neg Hx   . Colon cancer Neg Hx     Social History   Socioeconomic History  . Marital status: Legally Separated    Spouse name: Not on file  . Number of children: 1  . Years of education: Not on file  . Highest education level: 12th grade  Occupational History  . Occupation: Cytogeneticist  Social Needs  . Financial resource strain: Not hard at all  . Food insecurity:    Worry: Never true    Inability: Never true  . Transportation needs:    Medical: No    Non-medical: No  Tobacco Use  . Smoking status: Never Smoker  . Smokeless tobacco: Never Used  Substance and Sexual Activity  . Alcohol use: No    Alcohol/week: 0.0 standard drinks  . Drug use: No  . Sexual activity: Not Currently    Birth control/protection: Post-menopausal  Lifestyle  . Physical activity:    Days per week: 5 days    Minutes per session: 30 min  . Stress: Not at all  Relationships  . Social connections:    Talks on phone: More than three times a week    Gets together: More than three times a week    Attends religious service: More than 4 times per year    Active member of club or organization: Yes    Attends meetings of clubs or organizations: More than 4 times per year    Relationship status: Separated  . Intimate partner violence:    Fear of current or ex partner: No    Emotionally abused: No    Physically abused: No    Forced sexual activity: No  Other Topics Concern  . Not on file  Social History Narrative   Separated in around 2009.   She was living with her daughter, however currently moved in with her sister and nephew , but is looking for a house.      Current Outpatient Medications:  .  aspirin 81 MG tablet, Take by mouth., Disp: , Rfl:  .  atorvastatin (LIPITOR) 10 MG tablet, Take 1 tablet (10 mg total) by mouth daily., Disp: 90 tablet, Rfl: 2 .  Cholecalciferol (VITAMIN D3) 1000 units CAPS, Take  2 capsules by mouth daily., Disp: , Rfl:  .  Empagliflozin-metFORMIN HCl ER (SYNJARDY XR) 25-1000 MG TB24, Take 1 tablet by mouth daily., Disp: 90 tablet, Rfl: 1 .  ezetimibe (ZETIA) 10 MG tablet, Take 1 tablet (10 mg total) by mouth daily., Disp: 90 tablet, Rfl: 2 .  glucose blood (FREESTYLE LITE) test strip, FREESTYLE LITE TEST (In Vitro Strip)  use as directed for 0 days  Quantity: 200.00;  Refills: 3   Ordered :25-Nov-2014  Vonna Kotyk ;  Started 30-Dec-2008 Active Comments: DX: 250.00, Disp: , Rfl:  .  ketoconazole (NIZORAL) 2 % cream, Apply 1 application topically daily., Disp: 60 g, Rfl: 0 .  levothyroxine (SYNTHROID, LEVOTHROID) 100 MCG tablet, Take 100 mcg by mouth daily., Disp: , Rfl: 11 .  Multiple Vitamin (MULTIVITAMIN) tablet, Take 1 tablet by mouth daily., Disp: , Rfl:  .  Olmesartan-amLODIPine-HCTZ 40-5-25 MG TABS, Take 1 tablet by mouth daily., Disp: 90 tablet, Rfl: 1 .  Semaglutide (RYBELSUS) 3 MG TABS, Take 1 tablet by mouth daily., Disp: 30 tablet, Rfl: 0 .  Semaglutide (RYBELSUS) 7 MG TABS, Take 1 tablet by mouth daily. Fill after 3 mg dose, Disp: 30 tablet, Rfl: 1  No Known Allergies  I personally reviewed active problem list, medication list, allergies, family history, social history with the patient/caregiver today.   ROS  Constitutional: Negative for fever or weight change.  Respiratory: Negative for cough and shortness of breath.   Cardiovascular: Negative for chest pain or palpitations.  Gastrointestinal: Negative for abdominal pain, no bowel changes.  Musculoskeletal: Negative for gait problem or joint swelling.  Skin: Negative for rash.  Neurological: Negative for dizziness or headache.  No other specific complaints in a complete review of systems (except as listed in HPI above).  Objective  Vitals:   09/29/18 0811 09/29/18 0823  BP: 136/78 132/76  Pulse: 65   Resp: 16   Temp: 98.7 F (37.1 C)   TempSrc: Oral   SpO2: 99%   Weight: 223 lb 9.6 oz  (101.4 kg)   Height: 5\' 5"  (1.651 m)     Body mass index is 37.21 kg/m.  Physical Exam  Constitutional: Patient appears well-developed and well-nourished. Obese  No distress.  HEENT: head atraumatic, normocephalic, pupils equal and reactive to light,  neck supple, throat within normal limits Cardiovascular: Normal rate, regular rhythm and normal heart sounds.  No murmur heard. No BLE edema. Pulmonary/Chest: Effort normal and breath sounds normal. No respiratory distress. Abdominal: Soft.  There is no tenderness. Psychiatric: Patient has a normal mood and affect. behavior is normal. Judgment and thought content normal.  PHQ2/9: Depression screen [facility] 2/9 09/29/2018 06/26/2018 03/26/2018 12/24/2017 06/18/2017  Decreased Interest 0 0 0 0 0  Down, Depressed, Hopeless 0 0 0 0 0  PHQ - 2 Score 0 0 0 0 0  Altered sleeping 0 - - - -  Tired, decreased energy 0 - - - -  Change in appetite 0 - - - -  Feeling bad or failure about yourself  0 - - - -  Trouble concentrating 0 - - - -  Moving slowly or fidgety/restless 0 - - - -  Suicidal thoughts 0 - - - -  PHQ-9 Score 0 - - - -  Difficult doing work/chores Not difficult at all - - - -     Fall Risk: Fall Risk  09/29/2018 06/26/2018 03/26/2018 12/24/2017 09/19/2017  Falls in the past year? 0 No No No No     Functional Status Survey: Is the patient deaf or have difficulty hearing?: No Does the patient have difficulty seeing, even when wearing glasses/contacts?: No Does the patient have difficulty concentrating, remembering, or making decisions?: No Does the patient have difficulty walking or climbing stairs?: No Does the patient have difficulty dressing or bathing?: No Does the patient have difficulty doing errands alone such as visiting a doctor's office or shopping?: No    Assessment & Plan  1. Type 2 diabetes mellitus with diabetic nephropathy, without long-term current use of insulin (HCC)  - POCT glycosylated hemoglobin (Hb A1C) -  Semaglutide (RYBELSUS) 3 MG TABS; Take 1 tablet by mouth daily.  Dispense: 30 tablet; Refill: 0 - Semaglutide (RYBELSUS) 7 MG TABS; Take 1 tablet by mouth daily. Fill after 3 mg dose  Dispense: 30 tablet; Refill: 1  2. Need for influenza vaccination  - Flu Vaccine QUAD 6+ mos PF IM (Fluarix Quad PF)  3. CKD (chronic kidney disease), stage I   4. Benign essential HTN  At goal, continue medication   5. Severe obesity (BMI 35.0-35.9 with comorbidity) ([place])  Continue physical activity, hopefully diabetes medication will help with weight loss  6. Mild aortic stenosis by prior echocardiogram   7. Hypertensive left ventricular hypertrophy, without heart failure  Sees Dr. Rockey Situ   8. Hypertensive retinopathy, unspecified laterality  Keep follow up with eye doctor  9. Dyslipidemia  - atorvastatin (LIPITOR) 20 MG tablet; Take 1 tablet (20 mg total) by mouth daily.  Dispense: 90 tablet; Refill: 1  10. Post-surgical hypothyroidism  - TSH  11. Other insomnia  - traZODone (DESYREL) 50 MG tablet; Take 0.5-1 tablets (25-50 mg total) by mouth at bedtime as needed for sleep.  Dispense: 30 tablet; Refill: 0

## 2018-09-30 LAB — TSH: TSH: 2.56 m[IU]/L (ref 0.40–4.50)

## 2018-10-06 ENCOUNTER — Telehealth: Payer: Self-pay

## 2018-10-06 NOTE — Telephone Encounter (Signed)
Copied from [place] [phone]. Topic: Quick Communication - Rx Refill/Question >> Oct 06, 2018  1:27 PM Scherrie Gerlach wrote: Medication:  Empagliflozin-metFORMIN HCl ER (SYNJARDY XR) 25-1000 MG TB24 AND Semaglutide (RYBELSUS) 3 MG TABS  Pt wants to know if she is supposed to be taking these 2 meds together?  Just started the [facility], and was not clear about this.

## 2018-10-06 NOTE — Telephone Encounter (Signed)
Yes , needs to take both

## 2018-10-07 NOTE — Telephone Encounter (Signed)
Left a message on voicemail regarding her medication. Dr. Ancil Boozer stated she does need to take both the Rybelsus and Synjardy XR.

## 2018-11-08 ENCOUNTER — Other Ambulatory Visit: Payer: Self-pay | Admitting: Family Medicine

## 2018-11-08 DIAGNOSIS — E1121 Type 2 diabetes mellitus with diabetic nephropathy: Secondary | ICD-10-CM

## 2018-11-24 DIAGNOSIS — E039 Hypothyroidism, unspecified: Secondary | ICD-10-CM | POA: Diagnosis not present

## 2018-11-28 ENCOUNTER — Other Ambulatory Visit: Payer: Self-pay | Admitting: Family Medicine

## 2018-11-28 DIAGNOSIS — Z1231 Encounter for screening mammogram for malignant neoplasm of breast: Secondary | ICD-10-CM

## 2018-12-01 DIAGNOSIS — E890 Postprocedural hypothyroidism: Secondary | ICD-10-CM | POA: Diagnosis not present

## 2018-12-05 ENCOUNTER — Other Ambulatory Visit: Payer: Self-pay | Admitting: Family Medicine

## 2018-12-05 DIAGNOSIS — E1121 Type 2 diabetes mellitus with diabetic nephropathy: Secondary | ICD-10-CM

## 2018-12-05 NOTE — Telephone Encounter (Signed)
Request for diabetes medication.Synjardy to [facility]   Last office visit pertaining to diabetes: 09/29/2018   Lab Results  Component Value Date   HGBA1C 6.6 (A) 09/29/2018     Follow up on 12/29/2018

## 2018-12-26 ENCOUNTER — Ambulatory Visit
Admission: RE | Admit: 2018-12-26 | Discharge: 2018-12-26 | Disposition: A | Discharge status: Home / Self Care | Payer: BLUE CROSS/BLUE SHIELD | Source: Ambulatory Visit | Attending: Family Medicine | Admitting: Family Medicine

## 2018-12-26 DIAGNOSIS — Z1231 Encounter for screening mammogram for malignant neoplasm of breast: Secondary | ICD-10-CM | POA: Diagnosis not present

## 2018-12-29 ENCOUNTER — Encounter: Payer: Self-pay | Admitting: Family Medicine

## 2018-12-29 ENCOUNTER — Ambulatory Visit (INDEPENDENT_AMBULATORY_CARE_PROVIDER_SITE_OTHER): Payer: BLUE CROSS/BLUE SHIELD | Admitting: Family Medicine

## 2018-12-29 VITALS — BP 122/78 | HR 79 | Temp 98.0°F | Resp 16 | Ht 64.5 in | Wt 228.1 lb

## 2018-12-29 DIAGNOSIS — M79644 Pain in right finger(s): Secondary | ICD-10-CM

## 2018-12-29 DIAGNOSIS — Z79899 Other long term (current) drug therapy: Secondary | ICD-10-CM

## 2018-12-29 DIAGNOSIS — E785 Hyperlipidemia, unspecified: Secondary | ICD-10-CM | POA: Diagnosis not present

## 2018-12-29 DIAGNOSIS — E1121 Type 2 diabetes mellitus with diabetic nephropathy: Secondary | ICD-10-CM | POA: Diagnosis not present

## 2018-12-29 DIAGNOSIS — G8929 Other chronic pain: Secondary | ICD-10-CM

## 2018-12-29 DIAGNOSIS — F341 Dysthymic disorder: Secondary | ICD-10-CM

## 2018-12-29 DIAGNOSIS — Z23 Encounter for immunization: Secondary | ICD-10-CM

## 2018-12-29 DIAGNOSIS — Z6835 Body mass index (BMI) 35.0-35.9, adult: Secondary | ICD-10-CM

## 2018-12-29 DIAGNOSIS — M79671 Pain in right foot: Secondary | ICD-10-CM

## 2018-12-29 DIAGNOSIS — Z01419 Encounter for gynecological examination (general) (routine) without abnormal findings: Secondary | ICD-10-CM

## 2018-12-29 DIAGNOSIS — Z1211 Encounter for screening for malignant neoplasm of colon: Secondary | ICD-10-CM

## 2018-12-29 MED ORDER — SEMAGLUTIDE 3 MG PO TABS: 1.0000 | ORAL_TABLET | Freq: Every day | ORAL | 0 refills | Status: DC

## 2018-12-29 NOTE — Patient Instructions (Signed)
Preventive Care 40-64 Years, Female Preventive care refers to lifestyle choices and visits with your health care provider that can promote health and wellness. What does preventive care include?   A yearly physical exam. This is also called an annual well check.  Dental exams once or twice a year.  Routine eye exams. Ask your health care provider how often you should have your eyes checked.  Personal lifestyle choices, including: ? Daily care of your teeth and gums. ? Regular physical activity. ? Eating a healthy diet. ? Avoiding tobacco and drug use. ? Limiting alcohol use. ? Practicing safe sex. ? Taking low-dose aspirin daily starting at age 50. ? Taking vitamin and mineral supplements as recommended by your health care provider. What happens during an annual well check? The services and screenings done by your health care provider during your annual well check will depend on your age, overall health, lifestyle risk factors, and family history of disease. Counseling Your health care provider may ask you questions about your:  Alcohol use.  Tobacco use.  Drug use.  Emotional well-being.  Home and relationship well-being.  Sexual activity.  Eating habits.  Work and work environment.  Method of birth control.  Menstrual cycle.  Pregnancy history. Screening You may have the following tests or measurements:  Height, weight, and BMI.  Blood pressure.  Lipid and cholesterol levels. These may be checked every 5 years, or more frequently if you are over 50 years old.  Skin check.  Lung cancer screening. You may have this screening every year starting at age 55 if you have a 30-pack-year history of smoking and currently smoke or have quit within the past 15 years.  Colorectal cancer screening. All adults should have this screening starting at age 50 and continuing until age 75. Your health care provider may recommend screening at age 45. You will have tests every  1-10 years, depending on your results and the type of screening test. People at increased risk should start screening at an earlier age. Screening tests may include: ? Guaiac-based fecal occult blood testing. ? Fecal immunochemical test (FIT). ? Stool DNA test. ? Virtual colonoscopy. ? Sigmoidoscopy. During this test, a flexible tube with a tiny camera (sigmoidoscope) is used to examine your rectum and lower colon. The sigmoidoscope is inserted through your anus into your rectum and lower colon. ? Colonoscopy. During this test, a long, thin, flexible tube with a tiny camera (colonoscope) is used to examine your entire colon and rectum.  Hepatitis C blood test.  Hepatitis B blood test.  Sexually transmitted disease (STD) testing.  Diabetes screening. This is done by checking your blood sugar (glucose) after you have not eaten for a while (fasting). You may have this done every 1-3 years.  Mammogram. This may be done every 1-2 years. Talk to your health care provider about when you should start having regular mammograms. This may depend on whether you have a family history of breast cancer.  BRCA-related cancer screening. This may be done if you have a family history of breast, ovarian, tubal, or peritoneal cancers.  Pelvic exam and Pap test. This may be done every 3 years starting at age 21. Starting at age 30, this may be done every 5 years if you have a Pap test in combination with an HPV test.  Bone density scan. This is done to screen for osteoporosis. You may have this scan if you are at high risk for osteoporosis. Discuss your test results, treatment options,   and if necessary, the need for more tests with your health care provider. Vaccines Your health care provider may recommend certain vaccines, such as:  Influenza vaccine. This is recommended every year.  Tetanus, diphtheria, and acellular pertussis (Tdap, Td) vaccine. You may need a Td booster every 10 years.  Varicella  vaccine. You may need this if you have not been vaccinated.  Zoster vaccine. You may need this after age 38.  Measles, mumps, and rubella (MMR) vaccine. You may need at least one dose of MMR if you were born in 1957 or later. You may also need a second dose.  Pneumococcal 13-valent conjugate (PCV13) vaccine. You may need this if you have certain conditions and were not previously vaccinated.  Pneumococcal polysaccharide (PPSV23) vaccine. You may need one or two doses if you smoke cigarettes or if you have certain conditions.  Meningococcal vaccine. You may need this if you have certain conditions.  Hepatitis A vaccine. You may need this if you have certain conditions or if you travel or work in places where you may be exposed to hepatitis A.  Hepatitis B vaccine. You may need this if you have certain conditions or if you travel or work in places where you may be exposed to hepatitis B.  Haemophilus influenzae type b (Hib) vaccine. You may need this if you have certain conditions. Talk to your health care provider about which screenings and vaccines you need and how often you need them. This information is not intended to replace advice given to you by your health care provider. Make sure you discuss any questions you have with your health care provider. Document Released: 11/25/2015 Document Revised: 12/19/2017 Document Reviewed: 08/30/2015 Elsevier Interactive Patient Education  2019 [employer].

## 2018-12-29 NOTE — Progress Notes (Signed)
Name: Andrea Randolph   MRN: [id number]    DOB: 1956/06/26   Date:12/29/2018       Progress Note  Subjective  Chief Complaint  Chief Complaint  Patient presents with  . Annual Exam    HPI   Patient presents for annual CPE and follow up  DMII: she states she was on Rybelsus and stopped Synjardi, but than decided to stop Rybelsus because she was not losing weight and is back on Synjardi, she has noticed polyphagia, polyuria only after taking medication, gained about 4 lbs since last visit, also feeling tired. Discussed taking both medications again and recheck labs today.   Surgical hypothyroidism: seeing Dr. Gabriel Carina, last TSH was up to over 20, on higher dose of synthroid, explained that fatigue may be related to that also  Dysthymia: no previous history of depression, frustrated about her health and not feeling well, we will monitor for now and recheck in about one month   Morbid obesity: she has not been following a healthy diet, and currently not exercising as often because of foot pain and fatigue, she will try to resume both  Right thumb pain: going on for months, but much worse after she used a blower last Fall, she states wearing a brace to keep it from getting stuck and is tired of the pain  Right lateral foot pain: going on for over 6 months, pain on lateral aspect, aggravated by activity, dull aching pain, no redness or increase in warmth, no trauma that she can recall.     Diet: she has not been compliant lately, but she will try to resume a diabetic diet  Exercise: she has been going a couple of days a week now, she will try to go more often again   USPSTF grade A and B recommendations    Office Visit from 09/29/2018 in [facility]  AUDIT-C Score  0     Depression:  Depression screen [facility] 2/9 12/29/2018 09/29/2018 06/26/2018 03/26/2018 12/24/2017  Decreased Interest 1 0 0 0 0  Down, Depressed, Hopeless 0 0 0 0 0  PHQ - 2 Score 1 0 0 0 0  Altered  sleeping 2 0 - - -  Tired, decreased energy 1 0 - - -  Change in appetite 3 0 - - -  Feeling bad or failure about yourself  0 0 - - -  Trouble concentrating 1 0 - - -  Moving slowly or fidgety/restless 0 0 - - -  Suicidal thoughts 0 0 - - -  PHQ-9 Score 8 0 - - -  Difficult doing work/chores Not difficult at all Not difficult at all - - -   Hypertension: BP Readings from Last 3 Encounters:  12/29/18 122/78  09/29/18 132/76  06/26/18 108/76   Obesity: Wt Readings from Last 3 Encounters:  12/29/18 228 lb 1.6 oz (103.5 kg)  09/29/18 223 lb 9.6 oz (101.4 kg)  06/26/18 223 lb 14.4 oz (101.6 kg)   BMI Readings from Last 3 Encounters:  12/29/18 38.55 kg/m  09/29/18 37.21 kg/m  06/26/18 37.26 kg/m    Hep C Screening: up to date STD testing and prevention (HIV/chl/gon/syphilis): done last year  Intimate partner violence: negative screen  Sexual History/Pain during Intercourse: no pain  Menstrual History/LMP/Abnormal Bleeding: discussed importance of follow up in case of post-menopausal bleeding  Incontinence Symptoms:  Mild at this time, just when she gets home   Advanced Care Planning: A voluntary discussion about advance care planning including  the explanation and discussion of advance directives.  Discussed health care proxy and Living will, and the patient was able to identify a health care proxy as her daughter .  Patient does not have a living will at present time.   Breast cancer:  HM Mammogram  Date Value Ref Range Status  10/01/2014 Normal  Final    BRCA gene screening: N/A Cervical cancer screening: up to date   Osteoporosis Screening: discussed high calcium and high vitamin D diet   Lipids:  Lab Results  Component Value Date   CHOL 153 03/26/2018   CHOL 208 (H) 09/19/2017   CHOL 156 12/28/2016   Lab Results  Component Value Date   HDL 44 (L) 03/26/2018   HDL 57 09/19/2017   HDL 48 (L) 12/28/2016   Lab Results  Component Value Date   LDLCALC 94  03/26/2018   LDLCALC 132 (H) 09/19/2017   LDLCALC 99 12/28/2016   Lab Results  Component Value Date   TRIG 60 03/26/2018   TRIG 87 09/19/2017   TRIG 47 12/28/2016   Lab Results  Component Value Date   CHOLHDL 3.5 03/26/2018   CHOLHDL 3.6 09/19/2017   CHOLHDL 3.3 12/28/2016   No results found for: LDLDIRECT  Glucose:  Glucose, Bld  Date Value Ref Range Status  03/26/2018 103 (H) 65 - 99 mg/dL Final    Comment:    .            Fasting reference interval . For someone without known diabetes, a glucose value between 100 and 125 mg/dL is consistent with prediabetes and should be confirmed with a follow-up test. .   09/19/2017 103 (H) 65 - 99 mg/dL Final    Comment:    .            Fasting reference interval . For someone without known diabetes, a glucose value between 100 and 125 mg/dL is consistent with prediabetes and should be confirmed with a follow-up test. .   12/28/2016 85 65 - 99 mg/dL Final    Skin cancer: discussed atypical lesions  Colorectal cancer: due in March  Lung cancer:   Low Dose CT Chest recommended if Age 63-80 years, 30 pack-year currently smoking OR have quit w/in 15years. Patient does not qualify.   [id number]  Patient Active Problem List   Diagnosis Date Noted  . Severe obesity (BMI 35.0-35.9 with comorbidity) ([place]) 12/29/2018  . Mild concentric left ventricular hypertrophy (LVH) 12/24/2017  . Mild aortic stenosis by prior echocardiogram 12/24/2017  . Mild aortic regurgitation 12/24/2017  . CKD (chronic kidney disease), stage I 06/22/2016  . Uterine fibroid 01/11/2016  . Allergic rhinitis, seasonal 05/24/2015  . Benign essential HTN 05/24/2015  . Chronic constipation 05/24/2015  . Diabetes mellitus with renal manifestation ([place]) 05/24/2015  . Dyslipidemia 05/24/2015  . Post-surgical hypothyroidism 05/24/2015  . Mixed incontinence 05/24/2015  . Microalbuminuria 05/24/2015  . Menopause 05/24/2015  . Obstructive apnea 05/24/2015  .  Adult BMI 30+ 05/24/2015  . Hypertensive retinopathy 05/24/2015  . Vitamin D deficiency 05/24/2015  . Status post total replacement of right shoulder 03/11/2015    Past Surgical History:  Procedure Laterality Date  . BREAST BIOPSY Right 90's   benign core needle   . FOOT SURGERY    . TOTAL SHOULDER REPLACEMENT Right 01/25/2015   Dr. Roland Rack  . TOTAL THYROIDECTOMY  08/01/2018    Family History  Problem Relation Age of Onset  . Heart disease Mother   . Heart disease Father   .  Kidney disease Sister   . Hypertension Sister   . Diabetes Sister   . Hypertension Brother   . CVA Brother   . Breast cancer Maternal Aunt 69  . Hypertension Sister   . Kidney failure Sister   . Ovarian cancer Neg Hx   . Colon cancer Neg Hx     Social History   Socioeconomic History  . Marital status: Legally Separated    Spouse name: Not on file  . Number of children: 1  . Years of education: Not on file  . Highest education level: 12th grade  Occupational History  . Occupation: Cytogeneticist  Social Needs  . Financial resource strain: Not hard at all  . Food insecurity:    Worry: Never true    Inability: Never true  . Transportation needs:    Medical: No    Non-medical: No  Tobacco Use  . Smoking status: Never Smoker  . Smokeless tobacco: Never Used  Substance and Sexual Activity  . Alcohol use: No    Alcohol/week: 0.0 standard drinks  . Drug use: No  . Sexual activity: Not Currently    Birth control/protection: Post-menopausal  Lifestyle  . Physical activity:    Days per week: 5 days    Minutes per session: 30 min  . Stress: Not at all  Relationships  . Social connections:    Talks on phone: More than three times a week    Gets together: More than three times a week    Attends religious service: More than 4 times per year    Active member of club or organization: Yes    Attends meetings of clubs or organizations: More than 4 times per year    Relationship status:  Separated  . Intimate partner violence:    Fear of current or ex partner: No    Emotionally abused: No    Physically abused: No    Forced sexual activity: No  Other Topics Concern  . Not on file  Social History Narrative   Separated in around 2009.   She was living with her daughter, however currently moved in with her sister and nephew , but is looking for a house.      Current Outpatient Medications:  .  levothyroxine (SYNTHROID, LEVOTHROID) 125 MCG tablet, Take 1 tablet by mouth daily., Disp: , Rfl:  .  aspirin 81 MG tablet, Take by mouth., Disp: , Rfl:  .  atorvastatin (LIPITOR) 20 MG tablet, Take 1 tablet (20 mg total) by mouth daily., Disp: 90 tablet, Rfl: 1 .  Cholecalciferol (VITAMIN D3) 1000 units CAPS, Take 2 capsules by mouth daily., Disp: , Rfl:  .  ezetimibe (ZETIA) 10 MG tablet, Take 1 tablet (10 mg total) by mouth daily., Disp: 90 tablet, Rfl: 2 .  glucose blood (FREESTYLE LITE) test strip, FREESTYLE LITE TEST (In Vitro Strip)  use as directed for 0 days  Quantity: 200.00;  Refills: 3   Ordered :25-Nov-2014  Vonna Kotyk ;  Started 30-Dec-2008 Active Comments: DX: 250.00, Disp: , Rfl:  .  ketoconazole (NIZORAL) 2 % cream, Apply 1 application topically daily., Disp: 60 g, Rfl: 0 .  Multiple Vitamin (MULTIVITAMIN) tablet, Take 1 tablet by mouth daily., Disp: , Rfl:  .  Olmesartan-amLODIPine-HCTZ 40-5-25 MG TABS, Take 1 tablet by mouth daily., Disp: 90 tablet, Rfl: 1 .  Semaglutide (RYBELSUS) 3 MG TABS, Take 1 tablet by mouth daily., Disp: 30 tablet, Rfl: 0 .  SYNJARDY XR 25-1000 MG TB24, TAKE 1  TABLET DAILY, Disp: 90 tablet, Rfl: 0 .  traZODone (DESYREL) 50 MG tablet, Take 0.5-1 tablets (25-50 mg total) by mouth at bedtime as needed for sleep., Disp: 30 tablet, Rfl: 0  No Known Allergies   ROS  Constitutional: Negative for fever , positive for mild  weight change.  Respiratory: Negative for cough and shortness of breath.   Cardiovascular: Negative for chest pain or  palpitations.  Gastrointestinal: Negative for abdominal pain, no bowel changes.  Musculoskeletal: Negative for gait problem, positive joint swelling.  Skin: Negative for rash.  Neurological: Negative for dizziness or headache.  No other specific complaints in a complete review of systems (except as listed in HPI above).  Objective  Vitals:   12/29/18 1011  BP: 122/78  Pulse: 79  Resp: 16  Temp: 98 F (36.7 C)  TempSrc: Oral  SpO2: 99%  Weight: 228 lb 1.6 oz (103.5 kg)  Height: 5' 4.5" (1.638 m)    Body mass index is 38.55 kg/m.  Physical Exam  Constitutional: Patient appears well-developed and well-nourished. No distress.  HENT: Head: Normocephalic and atraumatic. Ears: B TMs ok, no erythema or effusion; Nose: Nose normal. Mouth/Throat: Oropharynx is clear and moist. No oropharyngeal exudate. Well healed thyroidectomy scar  Eyes: Conjunctivae and EOM are normal. Pupils are equal, round, and reactive to light. No scleral icterus.  Neck: Normal range of motion. Neck supple. No JVD present. No thyromegaly present.  Cardiovascular: Normal rate, regular rhythm and normal heart sounds.  2/6 systolic  murmur heard. No BLE edema. Pulmonary/Chest: Effort normal and breath sounds normal. No respiratory distress. Abdominal: Soft. Bowel sounds are normal, no distension. There is no tenderness. no masses Breast: no lumps or masses, no nipple discharge or rashes FEMALE GENITALIA:  Normal external genitalia  RECTAL: not done Musculoskeletal: pain with palpation of ventral MCP of right thumb, some swelling, pain with rom of thumb, also pain with palpation of right lateral column of right foot, no swelling or redness  Neurological: he is alert and oriented to person, place, and time. No cranial nerve deficit. Coordination, balance, strength, speech and gait are normal.  Skin: Skin is warm and dry. No rash noted. No erythema.  Psychiatric: Patient has a normal mood and affect. behavior is  normal. Judgment and thought content normal.  Diabetic Foot Exam: Diabetic Foot Exam - Simple   Simple Foot Form Diabetic Foot exam was performed with the following findings:  Yes 12/29/2018 10:28 AM  Visual Inspection No deformities, no ulcerations, no other skin breakdown bilaterally:  Yes Sensation Testing Intact to touch and monofilament testing bilaterally:  Yes Pulse Check Posterior Tibialis and Dorsalis pulse intact bilaterally:  Yes Comments      PHQ2/9: Depression screen [facility] 2/9 12/29/2018 09/29/2018 06/26/2018 03/26/2018 12/24/2017  Decreased Interest 1 0 0 0 0  Down, Depressed, Hopeless 0 0 0 0 0  PHQ - 2 Score 1 0 0 0 0  Altered sleeping 2 0 - - -  Tired, decreased energy 1 0 - - -  Change in appetite 3 0 - - -  Feeling bad or failure about yourself  0 0 - - -  Trouble concentrating 1 0 - - -  Moving slowly or fidgety/restless 0 0 - - -  Suicidal thoughts 0 0 - - -  PHQ-9 Score 8 0 - - -  Difficult doing work/chores Not difficult at all Not difficult at all - - -     Fall Risk: Fall Risk  12/29/2018 09/29/2018 06/26/2018  03/26/2018 12/24/2017  Falls in the past year? 0 0 No No No     Functional Status Survey: Is the patient deaf or have difficulty hearing?: No Does the patient have difficulty seeing, even when wearing glasses/contacts?: No Does the patient have difficulty concentrating, remembering, or making decisions?: No Does the patient have difficulty walking or climbing stairs?: No Does the patient have difficulty dressing or bathing?: No Does the patient have difficulty doing errands alone such as visiting a doctor's office or shopping?: No   Assessment & Plan  1. Type 2 diabetes mellitus with diabetic nephropathy, without long-term current use of insulin (HCC)  - COMPLETE METABOLIC PANEL WITH GFR - Hemoglobin A1c She is willing to resume Rybelsus today, sending 3 mg to pharmacy, continue Synjardi  2. Pain of right thumb  - Ambulatory referral to  Orthopedic Surgery  3. Dyslipidemia  - Lipid panel  4. Well woman exam  - Lipid panel - COMPLETE METABOLIC PANEL WITH GFR - B12 - Hemoglobin A1c  5. Long-term use of high-risk medication  - B12  6. Severe obesity (BMI 35.0-35.9 with comorbidity) ([place])  Discussed with the patient the risk posed by an increased BMI. Discussed importance of portion control, calorie counting and at least 150 minutes of physical activity weekly. Avoid sweet beverages and drink more water. Eat at least 6 servings of fruit and vegetables daily   7. Chronic pain in right foot  - Ambulatory referral to Podiatry   8. Dysthymia  TSH elevated, also seems like DM not at goal, we will recheck pqh 9 in one month during her regular follow up, discussed sun light exposure and getting DM to goal   9. Colon cancer screening  - Ambulatory referral to Gastroenterology  -USPSTF grade A and B recommendations reviewed with patient; age-appropriate recommendations, preventive care, screening tests, etc discussed and encouraged; healthy living encouraged; see AVS for patient education given to patient -Discussed importance of 150 minutes of physical activity weekly, eat two servings of fish weekly, eat one serving of tree nuts ( cashews, pistachios, pecans, almonds.Marland Kitchen) every other day, eat 6 servings of fruit/vegetables daily and drink plenty of water and avoid sweet beverages.

## 2018-12-30 LAB — COMPLETE METABOLIC PANEL WITH GFR
AG Ratio: 1.4 (calc) (ref 1.0–2.5)
ALT: 26 U/L (ref 6–29)
AST: 19 U/L (ref 10–35)
Albumin: 4.2 g/dL (ref 3.6–5.1)
Alkaline phosphatase (APISO): 47 U/L (ref 37–153)
BILIRUBIN TOTAL: 0.5 mg/dL (ref 0.2–1.2)
BUN/Creatinine Ratio: 12 (calc) (ref 6–22)
BUN: 13 mg/dL (ref 7–25)
CHLORIDE: 105 mmol/L (ref 98–110)
CO2: 30 mmol/L (ref 20–32)
Calcium: 9.7 mg/dL (ref 8.6–10.4)
Creat: 1.09 mg/dL — ABNORMAL HIGH (ref 0.50–0.99)
GFR, Est African American: 63 mL/min/{1.73_m2} (ref >=60)
GFR, Est Non African American: 55 mL/min/{1.73_m2} — ABNORMAL LOW (ref >=60)
Globulin: 2.9 g/dL (calc) (ref 1.9–3.7)
Glucose, Bld: 103 mg/dL — ABNORMAL HIGH (ref 65–99)
Potassium: 4.1 mmol/L (ref 3.5–5.3)
Sodium: 144 mmol/L (ref 135–146)
TOTAL PROTEIN: 7.1 g/dL (ref 6.1–8.1)

## 2018-12-30 LAB — LIPID PANEL
Cholesterol: 145 mg/dL (ref <200)
HDL: 51 mg/dL (ref >=50)
LDL Cholesterol (Calc): 81 mg/dL (calc)
NON-HDL CHOLESTEROL (CALC): 94 mg/dL (ref <130)
Total CHOL/HDL Ratio: 2.8 (calc) (ref <5.0)
Triglycerides: 53 mg/dL (ref <150)

## 2018-12-30 LAB — HEMOGLOBIN A1C
EAG (MMOL/L): 8.5 (calc)
Hgb A1c MFr Bld: 7 % of total Hgb — ABNORMAL HIGH (ref <5.7)
Mean Plasma Glucose: 154 (calc)

## 2018-12-30 LAB — VITAMIN B12: Vitamin B-12: 618 pg/mL (ref 200–1100)

## 2019-01-08 ENCOUNTER — Other Ambulatory Visit: Payer: Self-pay

## 2019-01-08 ENCOUNTER — Telehealth: Payer: Self-pay | Admitting: Gastroenterology

## 2019-01-08 DIAGNOSIS — Z1211 Encounter for screening for malignant neoplasm of colon: Secondary | ICD-10-CM

## 2019-01-08 NOTE — Telephone Encounter (Signed)
Patients call has been returned.  Colonoscopy scheduled for 01/26/19 with Dr. Marius Ditch at [facility].  Thanks [employer]

## 2019-01-08 NOTE — Telephone Encounter (Signed)
Patient was returning Michelle's call to schedule a colonoscopy.

## 2019-01-13 ENCOUNTER — Encounter: Payer: Self-pay | Admitting: Podiatry

## 2019-01-13 ENCOUNTER — Ambulatory Visit (INDEPENDENT_AMBULATORY_CARE_PROVIDER_SITE_OTHER): Payer: BLUE CROSS/BLUE SHIELD | Admitting: Podiatry

## 2019-01-13 ENCOUNTER — Ambulatory Visit: Payer: BLUE CROSS/BLUE SHIELD | Admitting: Family Medicine

## 2019-01-13 ENCOUNTER — Ambulatory Visit (INDEPENDENT_AMBULATORY_CARE_PROVIDER_SITE_OTHER): Payer: BLUE CROSS/BLUE SHIELD

## 2019-01-13 DIAGNOSIS — M779 Enthesopathy, unspecified: Secondary | ICD-10-CM

## 2019-01-13 DIAGNOSIS — M7671 Peroneal tendinitis, right leg: Secondary | ICD-10-CM

## 2019-01-13 MED ORDER — MELOXICAM 15 MG PO TABS: 15.0000 mg | ORAL_TABLET | Freq: Every day | ORAL | 1 refills | Status: DC

## 2019-01-15 NOTE — Progress Notes (Signed)
   HPI: 63 year old female presenting today as a new patient with a chief complaint of worsening sharp, shooting, throbbing pain of the lateral right foot that began a few months ago. She states the pain has worsened over the past two months. Exercising and being on the foot increases the pain. She has not done anything for treatment. Patient is here for further evaluation and treatment.   Past Medical History:  Diagnosis Date  . Allergy   . Anemia   . Arthritis of right shoulder region    Dr. Tamala Julian  . Chronic insomnia   . Diabetes mellitus without complication ([place])   . Goiter    Dr. Gabriel Carina  . Hyperlipidemia   . Hypertension   . Hypertensive retinopathy of left eye   . Hypothyroidism, adult    Dr. Gabriel Carina  . Inflammation of urethra   . Menopause   . Microalbuminuria    DM  . Mixed incontinence   . Muscle cramps   . Obesity   . OSA (obstructive sleep apnea)   . Radiculitis, lumbosacral   . Vitamin D deficiency      Physical Exam: General: The patient is alert and oriented x3 in no acute distress.  Dermatology: Skin is warm, dry and supple bilateral lower extremities. Negative for open lesions or macerations.  Vascular: Palpable pedal pulses bilaterally. No edema or erythema noted. Capillary refill within normal limits.  Neurological: Epicritic and protective threshold grossly intact bilaterally.   Musculoskeletal Exam: Pain with palpation to the insertion of the peroneal tendon of the right foot. Range of motion within normal limits to all pedal and ankle joints bilateral. Muscle strength 5/5 in all groups bilateral.   Radiographic Exam:  Normal osseous mineralization. Joint spaces preserved. No fracture/dislocation/boney destruction.    Assessment: 1. Insertional peroneal tendinitis right    Plan of Care:  1. Patient evaluated. X-Rays reviewed.  2. Injection of 0.5 mLs Celestone Soluspan injected into the peroneal tendon sheath of the right foot.  3. Prescription for  Meloxicam provided to patient. 4. Recommended good shoe gear.  5. Return to clinic in 4 weeks.   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      Edrick Kins, DPM [facility]  Dr. Edrick Kins, DPM    [address], [place] [address]                Office [phone]  Fax [phone]

## 2019-01-26 ENCOUNTER — Ambulatory Visit: Payer: BLUE CROSS/BLUE SHIELD | Admitting: Anesthesiology

## 2019-01-26 ENCOUNTER — Encounter: Payer: Self-pay | Admitting: *Deleted

## 2019-01-26 ENCOUNTER — Ambulatory Visit
Admission: RE | Admit: 2019-01-26 | Discharge: 2019-01-26 | Disposition: A | Discharge status: Home / Self Care | Payer: BLUE CROSS/BLUE SHIELD | Attending: Gastroenterology | Admitting: Gastroenterology

## 2019-01-26 ENCOUNTER — Encounter
Admission: RE | Disposition: A | Discharge status: Home / Self Care | Payer: Self-pay | Source: Home / Self Care | Attending: Gastroenterology

## 2019-01-26 ENCOUNTER — Other Ambulatory Visit: Payer: Self-pay

## 2019-01-26 DIAGNOSIS — Z6837 Body mass index (BMI) 37.0-37.9, adult: Secondary | ICD-10-CM | POA: Insufficient documentation

## 2019-01-26 DIAGNOSIS — E119 Type 2 diabetes mellitus without complications: Secondary | ICD-10-CM | POA: Diagnosis not present

## 2019-01-26 DIAGNOSIS — K635 Polyp of colon: Secondary | ICD-10-CM | POA: Diagnosis not present

## 2019-01-26 DIAGNOSIS — Z791 Long term (current) use of non-steroidal anti-inflammatories (NSAID): Secondary | ICD-10-CM | POA: Insufficient documentation

## 2019-01-26 DIAGNOSIS — Z7984 Long term (current) use of oral hypoglycemic drugs: Secondary | ICD-10-CM | POA: Diagnosis not present

## 2019-01-26 DIAGNOSIS — Z1211 Encounter for screening for malignant neoplasm of colon: Secondary | ICD-10-CM

## 2019-01-26 DIAGNOSIS — Z7982 Long term (current) use of aspirin: Secondary | ICD-10-CM | POA: Diagnosis not present

## 2019-01-26 DIAGNOSIS — E785 Hyperlipidemia, unspecified: Secondary | ICD-10-CM | POA: Diagnosis not present

## 2019-01-26 DIAGNOSIS — I129 Hypertensive chronic kidney disease with stage 1 through stage 4 chronic kidney disease, or unspecified chronic kidney disease: Secondary | ICD-10-CM | POA: Diagnosis not present

## 2019-01-26 DIAGNOSIS — G4733 Obstructive sleep apnea (adult) (pediatric): Secondary | ICD-10-CM | POA: Diagnosis not present

## 2019-01-26 DIAGNOSIS — Z96611 Presence of right artificial shoulder joint: Secondary | ICD-10-CM | POA: Insufficient documentation

## 2019-01-26 DIAGNOSIS — Z79890 Hormone replacement therapy (postmenopausal): Secondary | ICD-10-CM | POA: Diagnosis not present

## 2019-01-26 DIAGNOSIS — E890 Postprocedural hypothyroidism: Secondary | ICD-10-CM | POA: Diagnosis not present

## 2019-01-26 DIAGNOSIS — E559 Vitamin D deficiency, unspecified: Secondary | ICD-10-CM | POA: Diagnosis not present

## 2019-01-26 DIAGNOSIS — E669 Obesity, unspecified: Secondary | ICD-10-CM | POA: Insufficient documentation

## 2019-01-26 DIAGNOSIS — D122 Benign neoplasm of ascending colon: Secondary | ICD-10-CM | POA: Diagnosis not present

## 2019-01-26 DIAGNOSIS — I10 Essential (primary) hypertension: Secondary | ICD-10-CM | POA: Diagnosis not present

## 2019-01-26 DIAGNOSIS — Z79899 Other long term (current) drug therapy: Secondary | ICD-10-CM | POA: Diagnosis not present

## 2019-01-26 DIAGNOSIS — D123 Benign neoplasm of transverse colon: Secondary | ICD-10-CM | POA: Diagnosis not present

## 2019-01-26 DIAGNOSIS — E1122 Type 2 diabetes mellitus with diabetic chronic kidney disease: Secondary | ICD-10-CM | POA: Diagnosis not present

## 2019-01-26 HISTORY — PX: COLONOSCOPY WITH PROPOFOL: SHX5780

## 2019-01-26 LAB — GLUCOSE, CAPILLARY: GLUCOSE-CAPILLARY: 98 mg/dL (ref 70–99)

## 2019-01-26 SURGERY — COLONOSCOPY WITH PROPOFOL
Anesthesia: General

## 2019-01-26 MED ORDER — SODIUM CHLORIDE 0.9 % IV SOLN
INTRAVENOUS | Status: DC
  Administered 2019-01-26: 11:00:00 via INTRAVENOUS

## 2019-01-26 MED ORDER — PHENYLEPHRINE HCL 10 MG/ML IJ SOLN
INTRAMUSCULAR | Status: DC | PRN
  Administered 2019-01-26 (×2): 50 ug via INTRAVENOUS

## 2019-01-26 MED ORDER — PROPOFOL 10 MG/ML IV BOLUS
INTRAVENOUS | Status: DC | PRN
  Administered 2019-01-26: 70 mg via INTRAVENOUS
  Administered 2019-01-26: 30 mg via INTRAVENOUS
  Administered 2019-01-26: 40 mg via INTRAVENOUS

## 2019-01-26 MED ORDER — PROPOFOL 500 MG/50ML IV EMUL
INTRAVENOUS | Status: DC | PRN
  Administered 2019-01-26: 100 ug/kg/min via INTRAVENOUS

## 2019-01-26 MED ORDER — LIDOCAINE HCL (CARDIAC) PF 100 MG/5ML IV SOSY
PREFILLED_SYRINGE | INTRAVENOUS | Status: DC | PRN
  Administered 2019-01-26: 100 mg via INTRAVENOUS

## 2019-01-26 MED ORDER — LIDOCAINE HCL (PF) 2 % IJ SOLN
INTRAMUSCULAR | Status: AC
  Filled 2019-01-26: qty 10

## 2019-01-26 MED ORDER — PROPOFOL 10 MG/ML IV BOLUS
INTRAVENOUS | Status: AC
  Filled 2019-01-26: qty 80

## 2019-01-26 MED ORDER — PROPOFOL 10 MG/ML IV BOLUS
INTRAVENOUS | Status: AC
  Filled 2019-01-26: qty 20

## 2019-01-26 MED ORDER — PHENYLEPHRINE HCL 10 MG/ML IJ SOLN
INTRAMUSCULAR | Status: AC
  Filled 2019-01-26: qty 1

## 2019-01-26 NOTE — Transfer of Care (Signed)
Immediate Anesthesia Transfer of Care Note  Patient: Andrea Randolph  Procedure(s) Performed: COLONOSCOPY WITH PROPOFOL (N/A )  Patient Location: PACU and Endoscopy Unit  Anesthesia Type:General  Level of Consciousness: awake, oriented and patient cooperative  Airway & Oxygen Therapy: Patient Spontanous Breathing  Post-op Assessment: Report given to RN, Post -op Vital signs reviewed and stable and Patient moving all extremities  Post vital signs: Reviewed and stable  Last Vitals:  Vitals Value Taken Time  BP 126/62 01/26/2019 11:07 AM  Temp 36.1 C 01/26/2019 11:07 AM  Pulse 61 01/26/2019 11:12 AM  Resp 22 01/26/2019 11:12 AM  SpO2 99 % 01/26/2019 11:12 AM  Vitals shown include unvalidated device data.  Last Pain:  Vitals:   01/26/19 1107  TempSrc: Tympanic  PainSc: 0-No pain         Complications: No apparent anesthesia complications

## 2019-01-26 NOTE — Anesthesia Preprocedure Evaluation (Addendum)
Anesthesia Evaluation  Patient identified by MRN, date of birth, ID band Patient awake    Reviewed: Allergy & Precautions, H&P , NPO status , Patient's Chart, lab work & pertinent test results  Airway Mallampati: III       Dental  (+) Teeth Intact   Pulmonary sleep apnea ,    breath sounds clear to auscultation       Cardiovascular hypertension,  Rhythm:regular     Neuro/Psych negative neurological ROS  negative psych ROS   GI/Hepatic negative GI ROS, Neg liver ROS,   Endo/Other  diabetesHypothyroidism   Renal/GU CRFRenal disease  negative genitourinary   Musculoskeletal   Abdominal   Peds  Hematology  (+) Blood dyscrasia, anemia ,   Anesthesia Other Findings Obesity  Past Medical History: No date: Allergy No date: Anemia No date: Arthritis of right shoulder region     Comment:  Dr. Tamala Julian No date: Chronic insomnia No date: Diabetes mellitus without complication ([place]) No date: Goiter     Comment:  Dr. Gabriel Carina No date: Hyperlipidemia No date: Hypertension No date: Hypertensive retinopathy of left eye No date: Hypothyroidism, adult     Comment:  Dr. Gabriel Carina No date: Inflammation of urethra No date: Menopause No date: Microalbuminuria     Comment:  DM No date: Mixed incontinence No date: Muscle cramps No date: Obesity No date: OSA (obstructive sleep apnea) No date: Radiculitis, lumbosacral No date: Vitamin D deficiency  Past Surgical History: 90's: BREAST BIOPSY; Right     Comment:  benign core needle  No date: FOOT SURGERY 01/25/2015: TOTAL SHOULDER REPLACEMENT; Right     Comment:  Dr. Roland Rack 08/01/2018: TOTAL THYROIDECTOMY  BMI    Body Mass Index:  37.44 kg/m      Reproductive/Obstetrics negative OB ROS                            Anesthesia Physical Anesthesia Plan  ASA: III  Anesthesia Plan: General   Post-op Pain Management:    Induction:   PONV Risk Score and  Plan: Propofol infusion and TIVA  Airway Management Planned: Simple Face Mask  Additional Equipment:   Intra-op Plan:   Post-operative Plan:   Informed Consent: I have reviewed the patients History and Physical, chart, labs and discussed the procedure including the risks, benefits and alternatives for the proposed anesthesia with the patient or authorized representative who has indicated his/her understanding and acceptance.     Dental Advisory Given  Plan Discussed with: Anesthesiologist and CRNA  Anesthesia Plan Comments:         Anesthesia Quick Evaluation

## 2019-01-26 NOTE — Op Note (Signed)
[facility] Gastroenterology Patient Name: Andrea Randolph Procedure Date: 01/26/2019 10:21 AM MRN: [id number] Account #: [id number] Date of Birth: 1956/01/28 Admit Type: Outpatient Age: 63 Room: [facility] ENDO ROOM 3 Gender: Female Note Status: Finalized Procedure:            Colonoscopy Indications:          Screening for colorectal malignant neoplasm, Last                        colonoscopy: March 2010 Providers:            Lin Landsman MD, MD Referring MD:         Bethena Roys. Sowles, MD (Referring MD) Medicines:            Monitored Anesthesia Care Complications:        No immediate complications. Estimated blood loss: None. Procedure:            Pre-Anesthesia Assessment:                       - Prior to the procedure, a History and Physical was                        performed, and patient medications and allergies were                        reviewed. The patient is competent. The risks and                        benefits of the procedure and the sedation options and                        risks were discussed with the patient. All questions                        were answered and informed consent was obtained.                        Patient identification and proposed procedure were                        verified by the physician, the nurse, the                        anesthesiologist, the anesthetist and the technician in                        the pre-procedure area in the procedure room in the                        endoscopy suite. Mental Status Examination: alert and                        oriented. Airway Examination: normal oropharyngeal                        airway and neck mobility. Respiratory Examination:                        clear to auscultation. CV Examination: normal.  Prophylactic Antibiotics: The patient does not require                        prophylactic antibiotics. Prior Anticoagulants: The   patient has taken no previous anticoagulant or                        antiplatelet agents. ASA Grade Assessment: III - A                        patient with severe systemic disease. After reviewing                        the risks and benefits, the patient was deemed in                        satisfactory condition to undergo the procedure. The                        anesthesia plan was to use monitored anesthesia care                        (MAC). Immediately prior to administration of                        medications, the patient was re-assessed for adequacy                        to receive sedatives. The heart rate, respiratory rate,                        oxygen saturations, blood pressure, adequacy of                        pulmonary ventilation, and response to care were                        monitored throughout the procedure. The physical status                        of the patient was re-assessed after the procedure.                       After obtaining informed consent, the colonoscope was                        passed under direct vision. Throughout the procedure,                        the patient's blood pressure, pulse, and oxygen                        saturations were monitored continuously. The                        Colonoscope was introduced through the anus and                        advanced to the the cecum, identified by appendiceal  orifice and ileocecal valve. The colonoscopy was                        somewhat difficult due to significant looping and the                        patient's body habitus. Successful completion of the                        procedure was aided by applying abdominal pressure. The                        patient tolerated the procedure well. The quality of                        the bowel preparation was evaluated using the BBPS                        [facility] Bowel Preparation Scale) with scores of: Right                         Colon = 3, Transverse Colon = 3 and Left Colon = 3                        (entire mucosa seen well with no residual staining,                        small fragments of stool or opaque liquid). The total                        BBPS score equals 9. Findings:      The perianal and digital rectal examinations were normal. Pertinent       negatives include normal sphincter tone and no palpable rectal lesions.      Six sessile polyps were found in the ascending colon. The polyps were 4       to 6 mm in size. These polyps were removed with a cold snare. Resection       and retrieval were complete.      A 12 mm polyp was found in the transverse colon. The polyp was       pedunculated. The polyp was removed with a hot snare. Resection and       retrieval were complete.      The retroflexed view of the distal rectum and anal verge was normal and       showed no anal or rectal abnormalities.      The exam was otherwise without abnormality. Impression:           - Six 4 to 6 mm polyps in the ascending colon, removed                        with a cold snare. Resected and retrieved.                       - One 12 mm polyp in the transverse colon, removed with                        a hot snare. Resected and retrieved.                       -  The distal rectum and anal verge are normal on                        retroflexion view.                       - The examination was otherwise normal. Recommendation:       - Discharge patient to home (with escort).                       - Resume previous diet today.                       - Continue present medications.                       - Await pathology results.                       - Repeat colonoscopy in 3 years for surveillance of                        multiple polyps. Procedure Code(s):    --- Professional ---                       [phone], Colonoscopy, flexible; with removal of tumor(s),                        polyp(s), or other  lesion(s) by snare technique Diagnosis Code(s):    --- Professional ---                       Z12.11, Encounter for screening for malignant neoplasm                        of colon                       D12.2, Benign neoplasm of ascending colon                       D12.3, Benign neoplasm of transverse colon (hepatic                        flexure or splenic flexure) CPT copyright 2018 American Medical Association. All rights reserved. The codes documented in this report are preliminary and upon coder review may  be revised to meet current compliance requirements. Dr. Ulyess Mort Lin Landsman MD, MD 01/26/2019 11:03:24 AM This report has been signed electronically. Number of Addenda: 0 Note Initiated On: 01/26/2019 10:21 AM Scope Withdrawal Time: 0 hours 17 minutes 56 seconds  Total Procedure Duration: 0 hours 22 minutes 3 seconds       [facility]

## 2019-01-26 NOTE — H&P (Signed)
Cephas Darby, MD [address]  [place]  [place], [place] [address]  Main: [phone]  Fax: [phone] Pager: [phone]  Primary Care Physician:  Steele Sizer, MD Primary Gastroenterologist:  Dr. Cephas Darby  Pre-Procedure History & Physical: HPI:  Andrea Randolph is a 63 y.o. female is here for an colonoscopy.   Past Medical History:  Diagnosis Date  . Allergy   . Anemia   . Arthritis of right shoulder region    Dr. Tamala Julian  . Chronic insomnia   . Diabetes mellitus without complication ([place])   . Goiter    Dr. Gabriel Carina  . Hyperlipidemia   . Hypertension   . Hypertensive retinopathy of left eye   . Hypothyroidism, adult    Dr. Gabriel Carina  . Inflammation of urethra   . Menopause   . Microalbuminuria    DM  . Mixed incontinence   . Muscle cramps   . Obesity   . OSA (obstructive sleep apnea)   . Radiculitis, lumbosacral   . Vitamin D deficiency     Past Surgical History:  Procedure Laterality Date  . BREAST BIOPSY Right 90's   benign core needle   . FOOT SURGERY    . TOTAL SHOULDER REPLACEMENT Right 01/25/2015   Dr. Roland Rack  . TOTAL THYROIDECTOMY  08/01/2018    Prior to Admission medications   Medication Sig Start Date End Date Taking? Authorizing Provider  aspirin 81 MG tablet Take by mouth. 12/27/10  Yes [provider]  Cholecalciferol (VITAMIN D3) 1000 units CAPS Take 2 capsules by mouth daily.   Yes [provider]  glucose blood (FREESTYLE LITE) test strip FREESTYLE LITE TEST (In Vitro Strip)  use as directed for 0 days  Quantity: 200.00;  Refills: 3   Ordered :25-Nov-2014  Vonna Kotyk ;  Started 30-Dec-2008 Active Comments: DX: 250.00 12/30/08  Yes [provider]  ketoconazole (NIZORAL) 2 % cream Apply 1 application topically daily. 06/26/18  Yes Sowles, Drue Stager, MD  levothyroxine (SYNTHROID, LEVOTHROID) 125 MCG tablet Take 1 tablet by mouth daily. 12/01/18  Yes [provider]  meloxicam (MOBIC) 15 MG  tablet Take 1 tablet (15 mg total) by mouth daily. 01/13/19  Yes Edrick Kins, DPM  Multiple Vitamin (MULTIVITAMIN) tablet Take 1 tablet by mouth daily.   Yes [provider]  Olmesartan-amLODIPine-HCTZ 40-5-25 MG TABS Take 1 tablet by mouth daily. 06/26/18  Yes Sowles, Drue Stager, MD  Semaglutide (RYBELSUS) 3 MG TABS Take 1 tablet by mouth daily. 12/29/18  Yes Sowles, Drue Stager, MD  SYNJARDY XR 25-1000 MG TB24 TAKE 1 TABLET DAILY 12/07/18  Yes Steele Sizer, MD  traZODone (DESYREL) 50 MG tablet Take 0.5-1 tablets (25-50 mg total) by mouth at bedtime as needed for sleep. 09/29/18  Yes Sowles, Drue Stager, MD  atorvastatin (LIPITOR) 20 MG tablet Take 1 tablet (20 mg total) by mouth daily. 09/29/18 12/28/18  Steele Sizer, MD  ezetimibe (ZETIA) 10 MG tablet Take 1 tablet (10 mg total) by mouth daily. 05/21/18 08/19/18  Minna Merritts, MD    Allergies as of 01/08/2019  . (No Known Allergies)    Family History  Problem Relation Age of Onset  . Heart disease Mother   . Heart disease Father   . Kidney disease Sister   . Hypertension Sister   . Diabetes Sister   . Hypertension Brother   . CVA Brother   . Breast cancer Maternal Aunt 19  . Hypertension Sister   . Kidney failure Sister   .  Ovarian cancer Neg Hx   . Colon cancer Neg Hx     Social History   Socioeconomic History  . Marital status: Legally Separated    Spouse name: Not on file  . Number of children: 1  . Years of education: Not on file  . Highest education level: 12th grade  Occupational History  . Occupation: Cytogeneticist  Social Needs  . Financial resource strain: Not hard at all  . Food insecurity:    Worry: Never true    Inability: Never true  . Transportation needs:    Medical: No    Non-medical: No  Tobacco Use  . Smoking status: Never Smoker  . Smokeless tobacco: Never Used  Substance and Sexual Activity  . Alcohol use: No    Alcohol/week: 0.0 standard drinks  . Drug use: No  . Sexual  activity: Not Currently    Birth control/protection: Post-menopausal  Lifestyle  . Physical activity:    Days per week: 5 days    Minutes per session: 30 min  . Stress: Not at all  Relationships  . Social connections:    Talks on phone: More than three times a week    Gets together: More than three times a week    Attends religious service: More than 4 times per year    Active member of club or organization: Yes    Attends meetings of clubs or organizations: More than 4 times per year    Relationship status: Separated  . Intimate partner violence:    Fear of current or ex partner: No    Emotionally abused: No    Physically abused: No    Forced sexual activity: No  Other Topics Concern  . Not on file  Social History Narrative   Separated in around 2009.   She was living with her daughter, however currently moved in with her sister and nephew , but is looking for a house.     Review of Systems: See HPI, otherwise negative ROS  Physical Exam: BP (!) 152/74   Pulse 64   Temp 98.4 F (36.9 C) (Oral)   Resp 16   Ht 5\' 5"  (1.651 m)   Wt 102.1 kg   SpO2 99%   BMI 37.44 kg/m  General:   Alert,  pleasant and cooperative in NAD Head:  Normocephalic and atraumatic. Neck:  Supple; no masses or thyromegaly. Lungs:  Clear throughout to auscultation.    Heart:  Regular rate and rhythm. Abdomen:  Soft, nontender and nondistended. Normal bowel sounds, without guarding, and without rebound.   Neurologic:  Alert and  oriented x4;  grossly normal neurologically.  Impression/Plan: Andrea Randolph is here for an colonoscopy to be performed for colon cancer screening  Risks, benefits, limitations, and alternatives regarding  colonoscopy have been reviewed with the patient.  Questions have been answered.  All parties agreeable.   Sherri Sear, MD  01/26/2019, 10:13 AM

## 2019-01-26 NOTE — Anesthesia Postprocedure Evaluation (Signed)
Anesthesia Post Note  Patient: Andrea Randolph  Procedure(s) Performed: COLONOSCOPY WITH PROPOFOL (N/A )  Patient location during evaluation: PACU Anesthesia Type: General Level of consciousness: awake and alert Pain management: pain level controlled Vital Signs Assessment: post-procedure vital signs reviewed and stable Respiratory status: spontaneous breathing, nonlabored ventilation and respiratory function stable Cardiovascular status: blood pressure returned to baseline and stable Postop Assessment: no apparent nausea or vomiting Anesthetic complications: no     Last Vitals:  Vitals:   01/26/19 0925 01/26/19 1107  BP: (!) 152/74 126/62  Pulse: 64   Resp: 16   Temp: 36.9 C (!) 36.1 C  SpO2: 99%     Last Pain:  Vitals:   01/26/19 1127  TempSrc:   PainSc: 0-No pain                 Durenda Hurt

## 2019-01-26 NOTE — Anesthesia Post-op Follow-up Note (Signed)
Anesthesia QCDR form completed.        

## 2019-01-27 DIAGNOSIS — H5213 Myopia, bilateral: Secondary | ICD-10-CM | POA: Diagnosis not present

## 2019-01-27 LAB — SURGICAL PATHOLOGY

## 2019-01-27 LAB — HM DIABETES EYE EXAM

## 2019-01-28 DIAGNOSIS — E890 Postprocedural hypothyroidism: Secondary | ICD-10-CM | POA: Diagnosis not present

## 2019-02-02 ENCOUNTER — Encounter: Payer: Self-pay | Admitting: Gastroenterology

## 2019-02-04 DIAGNOSIS — E890 Postprocedural hypothyroidism: Secondary | ICD-10-CM | POA: Diagnosis not present

## 2019-02-10 ENCOUNTER — Ambulatory Visit: Payer: BLUE CROSS/BLUE SHIELD | Admitting: Podiatry

## 2019-02-11 ENCOUNTER — Telehealth: Payer: Self-pay

## 2019-02-11 DIAGNOSIS — I119 Hypertensive heart disease without heart failure: Secondary | ICD-10-CM

## 2019-02-11 DIAGNOSIS — E785 Hyperlipidemia, unspecified: Secondary | ICD-10-CM

## 2019-02-11 MED ORDER — OLMESARTAN-AMLODIPINE-HCTZ 40-5-25 MG PO TABS: 1.0000 | ORAL_TABLET | Freq: Every day | ORAL | 1 refills | Status: DC

## 2019-02-11 MED ORDER — EZETIMIBE 10 MG PO TABS: 10.0000 mg | ORAL_TABLET | Freq: Every day | ORAL | 2 refills | Status: DC

## 2019-02-11 MED ORDER — ATORVASTATIN CALCIUM 20 MG PO TABS: 20.0000 mg | ORAL_TABLET | Freq: Every day | ORAL | 3 refills | Status: DC

## 2019-02-11 NOTE — Telephone Encounter (Signed)
Spoke with patient who declined to reschedule her 02/23/2019 face to face visit with Christell Faith, PA as a telephone or video visit. She states she will call back at a later time to reschedule because "this is only a one year follow-up" and she "does not have much to discuss with him at this time".

## 2019-02-11 NOTE — Addendum Note (Signed)
Addended by: Verlon Au on: 02/11/2019 02:57 PM   Modules accepted: Orders

## 2019-02-11 NOTE — Telephone Encounter (Signed)
Call to patient to further discuss cancellation of appt. She reports that this appt was a routine 12 m follow up and she denies any cardiac sx at this time including chest pain, SOB, or swelling.   I reviewed medications and provided refills as needed for cardiac medications.   I asked patient to call back if she had any new sx or concerns. She agreed.   Routed to CV 19 pool for future rescheduling.

## 2019-02-19 DIAGNOSIS — M65311 Trigger thumb, right thumb: Secondary | ICD-10-CM | POA: Diagnosis not present

## 2019-02-19 DIAGNOSIS — M79642 Pain in left hand: Secondary | ICD-10-CM | POA: Diagnosis not present

## 2019-02-19 DIAGNOSIS — M659 Synovitis and tenosynovitis, unspecified: Secondary | ICD-10-CM | POA: Diagnosis not present

## 2019-02-19 DIAGNOSIS — G5603 Carpal tunnel syndrome, bilateral upper limbs: Secondary | ICD-10-CM | POA: Insufficient documentation

## 2019-02-19 DIAGNOSIS — M79641 Pain in right hand: Secondary | ICD-10-CM | POA: Diagnosis not present

## 2019-02-19 NOTE — Telephone Encounter (Signed)
RECALL PLACED FOR 06/13/2019 

## 2019-02-23 ENCOUNTER — Ambulatory Visit: Payer: BLUE CROSS/BLUE SHIELD | Admitting: Physician Assistant

## 2019-03-10 ENCOUNTER — Other Ambulatory Visit: Payer: Self-pay | Admitting: Family Medicine

## 2019-03-10 DIAGNOSIS — E1121 Type 2 diabetes mellitus with diabetic nephropathy: Secondary | ICD-10-CM

## 2019-03-10 NOTE — Telephone Encounter (Signed)
Refill request for diabetic medication:   Synjardy XR 25-1000 mg  Last office visit pertaining to diabetes: 12/29/2018   Lab Results  Component Value Date   HGBA1C 7.0 (H) 12/29/2018    Follow-ups on file. 03/30/2019

## 2019-03-30 ENCOUNTER — Encounter: Payer: Self-pay | Admitting: Family Medicine

## 2019-03-30 ENCOUNTER — Ambulatory Visit: Payer: BLUE CROSS/BLUE SHIELD | Admitting: Family Medicine

## 2019-03-30 ENCOUNTER — Other Ambulatory Visit: Payer: Self-pay

## 2019-03-30 VITALS — BP 128/84 | HR 79 | Temp 98.3°F | Resp 16 | Ht 64.5 in | Wt 224.0 lb

## 2019-03-30 DIAGNOSIS — E1122 Type 2 diabetes mellitus with diabetic chronic kidney disease: Secondary | ICD-10-CM

## 2019-03-30 DIAGNOSIS — N183 Chronic kidney disease, stage 3 unspecified: Secondary | ICD-10-CM

## 2019-03-30 DIAGNOSIS — G4733 Obstructive sleep apnea (adult) (pediatric): Secondary | ICD-10-CM

## 2019-03-30 DIAGNOSIS — M79644 Pain in right finger(s): Secondary | ICD-10-CM | POA: Diagnosis not present

## 2019-03-30 DIAGNOSIS — E785 Hyperlipidemia, unspecified: Secondary | ICD-10-CM | POA: Diagnosis not present

## 2019-03-30 DIAGNOSIS — I152 Hypertension secondary to endocrine disorders: Secondary | ICD-10-CM

## 2019-03-30 DIAGNOSIS — E1159 Type 2 diabetes mellitus with other circulatory complications: Secondary | ICD-10-CM

## 2019-03-30 DIAGNOSIS — I10 Essential (primary) hypertension: Secondary | ICD-10-CM | POA: Diagnosis not present

## 2019-03-30 LAB — POCT GLYCOSYLATED HEMOGLOBIN (HGB A1C): HbA1c, POC (controlled diabetic range): 6.3 % (ref 0.0–7.0)

## 2019-03-30 MED ORDER — EMPAGLIFLOZIN-METFORMIN HCL ER 25-1000 MG PO TB24: 1.0000 | ORAL_TABLET | Freq: Every day | ORAL | 1 refills | Status: DC

## 2019-03-30 NOTE — Progress Notes (Signed)
Name: Andrea Randolph   MRN: [id number]    DOB: 03-04-1956   Date:03/30/2019       Progress Note  Subjective  Chief Complaint  Chief Complaint  Patient presents with  . Medication Refill  . Diabetes    HPI  DMII: she states she was on Rybelsus and stopped Synjardi, but than decided to stop Rybelsus because she was not losing weight and is back on Synjardi, she has lost weight again since last visit, she denies side effects of medication   Surgical hypothyroidism: seeing Dr. Gabriel Carina, last TSH was 0.789 , mild weight loss, no change in bowel movements   Morbid obesity: she is doing better on her diet, lost weight since last visit, continue hard work   Right thumb pain: going on for months, but much worse after she used a blower last Fall, seen by Ortho and had steroid injection and is doing better, no trigger fingers   Hyperlipidemia: on statin and Zetia, denies myalgias. She also has LVH under the care of cardiologist , denies side effects of medication    Patient Active Problem List   Diagnosis Date Noted  . Encounter for screening colonoscopy   . Severe obesity (BMI 35.0-35.9 with comorbidity) ([place]) 12/29/2018  . Mild concentric left ventricular hypertrophy (LVH) 12/24/2017  . Mild aortic stenosis by prior echocardiogram 12/24/2017  . Mild aortic regurgitation 12/24/2017  . CKD (chronic kidney disease), stage I 06/22/2016  . Uterine fibroid 01/11/2016  . Allergic rhinitis, seasonal 05/24/2015  . Benign essential HTN 05/24/2015  . Chronic constipation 05/24/2015  . Diabetes mellitus with renal manifestation ([place]) 05/24/2015  . Dyslipidemia 05/24/2015  . Post-surgical hypothyroidism 05/24/2015  . Mixed incontinence 05/24/2015  . Microalbuminuria 05/24/2015  . Menopause 05/24/2015  . Obstructive apnea 05/24/2015  . Adult BMI 30+ 05/24/2015  . Hypertensive retinopathy 05/24/2015  . Vitamin D deficiency 05/24/2015  . Status post total replacement of right shoulder  03/11/2015    Past Surgical History:  Procedure Laterality Date  . BREAST BIOPSY Right 90's   benign core needle   . COLONOSCOPY WITH PROPOFOL N/A 01/26/2019   Procedure: COLONOSCOPY WITH PROPOFOL;  Surgeon: Lin Landsman, MD;  Location: [facility] ENDOSCOPY;  Service: Gastroenterology;  Laterality: N/A;  . FOOT SURGERY    . TOTAL SHOULDER REPLACEMENT Right 01/25/2015   Dr. Roland Rack  . TOTAL THYROIDECTOMY  08/01/2018    Family History  Problem Relation Age of Onset  . Heart disease Mother   . Heart disease Father   . Kidney disease Sister   . Hypertension Sister   . Diabetes Sister   . Hypertension Brother   . CVA Brother   . Breast cancer Maternal Aunt 59  . Hypertension Sister   . Kidney failure Sister   . Ovarian cancer Neg Hx   . Colon cancer Neg Hx     Social History   Socioeconomic History  . Marital status: Legally Separated    Spouse name: Not on file  . Number of children: 1  . Years of education: Not on file  . Highest education level: 12th grade  Occupational History  . Occupation: Cytogeneticist  Social Needs  . Financial resource strain: Not hard at all  . Food insecurity:    Worry: Never true    Inability: Never true  . Transportation needs:    Medical: No    Non-medical: No  Tobacco Use  . Smoking status: Never Smoker  . Smokeless tobacco: Never Used  Substance and  Sexual Activity  . Alcohol use: No    Alcohol/week: 0.0 standard drinks  . Drug use: No  . Sexual activity: Not Currently    Birth control/protection: Post-menopausal  Lifestyle  . Physical activity:    Days per week: 5 days    Minutes per session: 30 min  . Stress: Not at all  Relationships  . Social connections:    Talks on phone: More than three times a week    Gets together: More than three times a week    Attends religious service: More than 4 times per year    Active member of club or organization: Yes    Attends meetings of clubs or organizations: More than 4 times  per year    Relationship status: Separated  . Intimate partner violence:    Fear of current or ex partner: No    Emotionally abused: No    Physically abused: No    Forced sexual activity: No  Other Topics Concern  . Not on file  Social History Narrative   Separated in around 2009.   She was living with her daughter, however currently moved in with her sister and nephew , but is looking for a house.      Current Outpatient Medications:  .  aspirin 81 MG tablet, Take by mouth., Disp: , Rfl:  .  atorvastatin (LIPITOR) 20 MG tablet, Take 1 tablet (20 mg total) by mouth daily., Disp: 90 tablet, Rfl: 3 .  Cholecalciferol (VITAMIN D3) 1000 units CAPS, Take 2 capsules by mouth daily., Disp: , Rfl:  .  Empagliflozin-metFORMIN HCl ER (SYNJARDY XR) 25-1000 MG TB24, Take 1 tablet by mouth daily., Disp: 90 tablet, Rfl: 1 .  ezetimibe (ZETIA) 10 MG tablet, Take 1 tablet (10 mg total) by mouth daily., Disp: 90 tablet, Rfl: 2 .  glucose blood (FREESTYLE LITE) test strip, FREESTYLE LITE TEST (In Vitro Strip)  use as directed for 0 days  Quantity: 200.00;  Refills: 3   Ordered :25-Nov-2014  Vonna Kotyk ;  Started 30-Dec-2008 Active Comments: DX: 250.00, Disp: , Rfl:  .  ketoconazole (NIZORAL) 2 % cream, Apply 1 application topically daily., Disp: 60 g, Rfl: 0 .  levothyroxine (SYNTHROID, LEVOTHROID) 125 MCG tablet, Take 1 tablet by mouth daily., Disp: , Rfl:  .  Multiple Vitamin (MULTIVITAMIN) tablet, Take 1 tablet by mouth daily., Disp: , Rfl:  .  Olmesartan-amLODIPine-HCTZ 40-5-25 MG TABS, Take 1 tablet by mouth daily., Disp: 90 tablet, Rfl: 1  No Known Allergies  I personally reviewed active problem list, medication list, allergies, family history, social history with the patient/caregiver today.   ROS  Ten systems reviewed and is negative except as mentioned in HPI   Objective  Vitals:   03/30/19 0932  BP: 128/84  Pulse: 79  Resp: 16  Temp: 98.3 F (36.8 C)  TempSrc: Oral  SpO2:  99%  Weight: 224 lb (101.6 kg)  Height: 5' 4.5" (1.638 m)    Body mass index is 37.86 kg/m.  Physical Exam  Constitutional: Patient appears well-developed and well-nourished. Obese  No distress.  HEENT: head atraumatic, normocephalic, pupils equal and reactive to light,  neck supple, throat within normal limits Cardiovascular: Normal rate, regular rhythm and normal heart sounds.  No murmur heard. No BLE edema. Pulmonary/Chest: Effort normal and breath sounds normal. No respiratory distress. Abdominal: Soft.  There is no tenderness. Psychiatric: Patient has a normal mood and affect. behavior is normal. Judgment and thought content normal.  Recent Results (from  the past 2160 hour(s))  Glucose, capillary     Status: None   Collection Time: 01/26/19  9:34 AM  Result Value Ref Range   Glucose-Capillary 98 70 - 99 mg/dL  Surgical pathology     Status: None   Collection Time: 01/26/19 10:46 AM  Result Value Ref Range   SURGICAL PATHOLOGY      Surgical Pathology CASE: [id number] PATIENT: [facility] Surgical Pathology Report     SPECIMEN SUBMITTED: A. Colon polyp x6, ascending; cold snare B. Colon polyp, transverse; hot snare  CLINICAL HISTORY: None provided  PRE-OPERATIVE DIAGNOSIS: Screening colonoscopy  POST-OPERATIVE DIAGNOSIS: Colon polyps     DIAGNOSIS: A.  COLON POLYP X 6, ASCENDING; COLD SNARE: - TUBULAR ADENOMAS, AT LEAST 8 FRAGMENTS. - NEGATIVE FOR HIGH-GRADE DYSPLASIA AND MALIGNANCY.  B.  COLON POLYP, TRANSVERSE; HOT SNARE: - TUBULOVILLOUS ADENOMA, AT LEAST 12 MM, MULTIPLE FRAGMENTS. - NEGATIVE FOR HIGH-GRADE DYSPLASIA AND MALIGNANCY.  GROSS DESCRIPTION: A. Labeled: Cold snare polyp ascending colon X6 Received: Formalin Tissue fragment(s): Several Size: Aggregate, 0.8 x 0.7 x 0.1 cm Description: Tan soft tissue fragments Entirely submitted in A1.  B. Labeled: Hot snare polyp transverse colon Received: Formalin Tissue fragment(s):  Several Size: Ranging from 0.3 cm up to  1.2 cm in greatest dimension and measuring 1.2 x 0.5 x 0.4 cm in aggregate Description: The largest fragment resection margin is inked black and serially sectioned and the second-largest inked blue and bisected. Entirely submitted in B1.   Final Diagnosis performed by Bryan Lemma, MD.   Electronically signed 01/27/2019 5:44:22PM The electronic signature indicates that the named Attending Pathologist has evaluated the specimen  Technical component performed at [facility], [address] [place], [place] [address] Lab: [phone] Dir: Rush Farmer, MD, MMM  Professional component performed at [facility], [facility], [place], [place], [place] [address] Lab: [phone] Dir: Dellia Nims. Reuel Derby, MD   HM DIABETES EYE EXAM     Status: None   Collection Time: 01/27/19 12:00 AM  Result Value Ref Range   HM Diabetic Eye Exam No Retinopathy No Retinopathy  POCT HgB A1C     Status: Normal   Collection Time: 03/30/19  9:43 AM  Result Value Ref Range   Hemoglobin A1C     HbA1c POC (<> result, manual entry)     HbA1c, POC (prediabetic range)     HbA1c, POC (controlled diabetic range) 6.3 0.0 - 7.0 %      PHQ2/9: Depression screen [facility] 2/9 03/30/2019 12/29/2018 09/29/2018 06/26/2018 03/26/2018  Decreased Interest 0 1 0 0 0  Down, Depressed, Hopeless 0 0 0 0 0  PHQ - 2 Score 0 1 0 0 0  Altered sleeping 1 2 0 - -  Tired, decreased energy 0 1 0 - -  Change in appetite 0 3 0 - -  Feeling bad or failure about yourself  0 0 0 - -  Trouble concentrating 0 1 0 - -  Moving slowly or fidgety/restless 0 0 0 - -  Suicidal thoughts 0 0 0 - -  PHQ-9 Score 1 8 0 - -  Difficult doing work/chores Not difficult at all Not difficult at all Not difficult at all - -    phq 9 is negative   Fall Risk: Fall Risk  03/30/2019 12/29/2018 09/29/2018 06/26/2018 03/26/2018  Falls in the past year? 0 0 0 No No  Number falls in past yr: 0 - - - -  Injury  with Fall? 0 - - - -  Functional Status Survey: Is the patient deaf or have difficulty hearing?: No Does the patient have difficulty seeing, even when wearing glasses/contacts?: No Does the patient have difficulty concentrating, remembering, or making decisions?: No Does the patient have difficulty walking or climbing stairs?: No Does the patient have difficulty dressing or bathing?: No Does the patient have difficulty doing errands alone such as visiting a doctor's office or shopping?: No   Assessment & Plan   1. Controlled type 2 diabetes mellitus with stage 3 chronic kidney disease, without long-term current use of insulin (HCC)  - POCT HgB A1C - Empagliflozin-metFORMIN HCl ER (SYNJARDY XR) 25-1000 MG TB24; Take 1 tablet by mouth daily.  Dispense: 90 tablet; Refill: 1  2. Pain of right thumb  Seen by Ortho and had steroid injection and is better now  3. Dyslipidemia  Continue statin and zetia   4. Benign essential HTN  At goal   5. Obstructive apnea   6. Chronic kidney disease, stage III (moderate) (HCC)  Continue ARB  7. Hypertension associated with diabetes ([place])

## 2019-03-31 ENCOUNTER — Telehealth: Payer: Self-pay

## 2019-03-31 NOTE — Telephone Encounter (Signed)
Declined appointment with Jackqulyn Livings in office Will wait for an in office with Dr Rockey Situ in a couple months

## 2019-03-31 NOTE — Telephone Encounter (Signed)
Called patient.  No answer. LMOV.  Would like to offer patient in office with Christell Faith, PA

## 2019-06-21 NOTE — Progress Notes (Signed)
Cardiology Office Note  Date:  06/23/2019   ID:  Andrea Randolph, DOB 1956-06-01, MRN [id number]  PCP:  Steele Sizer, MD   Chief Complaint  Patient presents with  . Other    12 month follow up. Patient denies chest pain and SOB. Meds reviewed verbally with patient.     HPI:  Andrea Randolph is a 63 year old woman with past medical history of Obstructive sleep apnea Hypothyroid Hypertension Hyperlipidemia on Crestor every other day Diabetes  Chronic renal insufficiency Morbid obesity - Mild LVOT gradient with a peak gradient of 30 mm Hg. aortic valve disease,prior history of pulmonary hypertension, LVH on echo  Lab work reviewed HBA1c 6.3 Total chol 145 Having some trouble tolerating the Lipitor, missing some days Prior total cholesterol more than 200  BP 120 at work No SOB or chest pain  Prior studies reviewed with her Echo 2012 mild pulmonary hypertension Mild LVH and aortic stenosis Heart murmur  Previously exercising several days a week at the gym, TMs have been closed Problems with her knees and legs, chronic pain  Discussed previous statin intolerances  Crestor: muscle pain Lipitor: Does not remember the side effect She is tolerating several doses a week No side effects on Zetia  EKG personally reviewed by myself on todays visit Shows normal sinus rhythm rate 57 bpm nonspecific T wave abnormality No change compared to prior EKG   PMH:   has a past medical history of Allergy, Anemia, Arthritis of right shoulder region, Chronic insomnia, Diabetes mellitus without complication ([place]), Goiter, Hyperlipidemia, Hypertension, Hypertensive retinopathy of left eye, Hypothyroidism, adult, Inflammation of urethra, Menopause, Microalbuminuria, Mixed incontinence, Muscle cramps, Obesity, OSA (obstructive sleep apnea), Radiculitis, lumbosacral, and Vitamin D deficiency.  PSH:    Past Surgical History:  Procedure Laterality Date  . BREAST BIOPSY Right 90's   benign core  needle   . COLONOSCOPY WITH PROPOFOL N/A 01/26/2019   Procedure: COLONOSCOPY WITH PROPOFOL;  Surgeon: Lin Landsman, MD;  Location: [facility] ENDOSCOPY;  Service: Gastroenterology;  Laterality: N/A;  . FOOT SURGERY    . TOTAL SHOULDER REPLACEMENT Right 01/25/2015   Dr. Roland Rack  . TOTAL THYROIDECTOMY  08/01/2018    Current Outpatient Medications  Medication Sig Dispense Refill  . aspirin 81 MG tablet Take by mouth.    . Cholecalciferol (VITAMIN D3) 1000 units CAPS Take 2 capsules by mouth daily.    . Empagliflozin-metFORMIN HCl ER (SYNJARDY XR) 25-1000 MG TB24 Take 1 tablet by mouth daily. 90 tablet 1  . glucose blood (FREESTYLE LITE) test strip FREESTYLE LITE TEST (In Vitro Strip)  use as directed for 0 days  Quantity: 200.00;  Refills: 3   Ordered :25-Nov-2014  Vonna Kotyk ;  Started 30-Dec-2008 Active Comments: DX: 250.00    . ketoconazole (NIZORAL) 2 % cream Apply 1 application topically daily. 60 g 0  . levothyroxine (SYNTHROID, LEVOTHROID) 125 MCG tablet Take 1 tablet by mouth daily.    . Multiple Vitamin (MULTIVITAMIN) tablet Take 1 tablet by mouth daily.    . Olmesartan-amLODIPine-HCTZ 40-5-25 MG TABS Take 1 tablet by mouth daily. 90 tablet 1  . atorvastatin (LIPITOR) 20 MG tablet Take 1 tablet (20 mg total) by mouth daily. 90 tablet 3  . ezetimibe (ZETIA) 10 MG tablet Take 1 tablet (10 mg total) by mouth daily. 90 tablet 2   No current facility-administered medications for this visit.      Allergies:   Patient has no known allergies.   Social History:  The patient  reports that she has never smoked. She has never used smokeless tobacco. She reports that she does not drink alcohol or use drugs.   Family History:   family history includes Breast cancer (age of onset: 80) in her maternal aunt; CVA in her brother; Diabetes in her sister; Heart disease in her father and mother; Hypertension in her brother, sister, and sister; Kidney disease in her sister; Kidney failure in  her sister.    Review of Systems: Review of Systems  Constitutional: Negative.   Respiratory: Negative.   Cardiovascular: Negative.   Gastrointestinal: Negative.   Musculoskeletal: Positive for joint pain.  Neurological: Negative.   Psychiatric/Behavioral: Negative.   All other systems reviewed and are negative.    PHYSICAL EXAM: VS:  BP (!) 144/76 (BP Location: Left Arm, Patient Position: Sitting, Cuff Size: Large)   Pulse (!) 57   Ht 5\' 5"  (1.651 m)   Wt 232 lb 4 oz (105.3 kg)   BMI 38.65 kg/m  , BMI Body mass index is 38.65 kg/m. GEN: Well nourished, well developed, in no acute distress  HEENT: normal  Neck: no JVD, carotid bruits, or masses Cardiac: RRR; 1-2+ SEM RSB murmur, no rubs, or gallops,no edema  Respiratory:  clear to auscultation bilaterally, normal work of breathing GI: soft, nontender, nondistended, + BS MS: no deformity or atrophy  Skin: warm and dry, no rash Neuro:  Strength and sensation are intact Psych: euthymic mood, full affect   Recent Labs: 09/29/2018: TSH 2.56 12/29/2018: ALT 26; BUN 13; Creat 1.09; Potassium 4.1; Sodium 144    Lipid Panel Lab Results  Component Value Date   CHOL 145 12/29/2018   HDL 51 12/29/2018   LDLCALC 81 12/29/2018   TRIG 53 12/29/2018      Wt Readings from Last 3 Encounters:  06/23/19 232 lb 4 oz (105.3 kg)  03/30/19 224 lb (101.6 kg)  01/26/19 225 lb (102.1 kg)       ASSESSMENT AND PLAN:  Mild pulmonary hypertension ([place]) Not particularly elevated on echocardiogram 2012 Denies shortness of breath, repeat echocardiogram ordered  Murmur - Mild LVOT gradient with a peak gradient of 30 mm Hg. On echo 2019 No significant aortic valve stenoisis  Mild aortic regurgitation - Plan: EKG 12-Lead Echocardiogram as above  Type 2 diabetes mellitus with diabetic nephropathy, without long-term current use of insulin (HCC) Hemoglobin A1c well controlled Exercises 3 days a week  Benign essential HTN Blood  pressure initially elevated 016 systolic, we will recheck Recommended she monitor blood pressure at home  Mild concentric left ventricular hypertrophy (LVH) Stressed importance of aggressive cholesterol management She will monitor  Dyslipidemia Continue Zetia daily Lipitor several times a week as tolerated  Abnormal EKG  nonspecific T wave abnormality anterolateral leads, unchanged Possibly from mild LVH Denies any symptoms concerning for angina Normal ejection fraction on echocardiogram, No further testing ordered  Disposition:   F/U  12 months    Total encounter time more than 25 minutes  Greater than 50% was spent in counseling and coordination of care with the patient    Orders Placed This Encounter  Procedures  . EKG 12-Lead     Signed, Esmond Plants, M.D., Ph.D. 06/23/2019  [place], [place]

## 2019-06-23 ENCOUNTER — Ambulatory Visit (INDEPENDENT_AMBULATORY_CARE_PROVIDER_SITE_OTHER): Payer: BC Managed Care – PPO | Admitting: Cardiovascular Disease

## 2019-06-23 ENCOUNTER — Encounter: Payer: Self-pay | Admitting: Cardiovascular Disease

## 2019-06-23 ENCOUNTER — Other Ambulatory Visit: Payer: Self-pay

## 2019-06-23 VITALS — BP 144/76 | HR 57 | Ht 65.0 in | Wt 232.2 lb

## 2019-06-23 DIAGNOSIS — I10 Essential (primary) hypertension: Secondary | ICD-10-CM

## 2019-06-23 DIAGNOSIS — E785 Hyperlipidemia, unspecified: Secondary | ICD-10-CM

## 2019-06-23 DIAGNOSIS — E1121 Type 2 diabetes mellitus with diabetic nephropathy: Secondary | ICD-10-CM

## 2019-06-23 DIAGNOSIS — I119 Hypertensive heart disease without heart failure: Secondary | ICD-10-CM

## 2019-06-23 DIAGNOSIS — I272 Pulmonary hypertension, unspecified: Secondary | ICD-10-CM

## 2019-06-23 DIAGNOSIS — I350 Nonrheumatic aortic (valve) stenosis: Secondary | ICD-10-CM

## 2019-06-23 NOTE — Patient Instructions (Signed)

## 2019-06-24 ENCOUNTER — Other Ambulatory Visit: Payer: Self-pay | Admitting: Family Medicine

## 2019-06-24 DIAGNOSIS — N183 Chronic kidney disease, stage 3 unspecified: Secondary | ICD-10-CM

## 2019-06-24 DIAGNOSIS — E1122 Type 2 diabetes mellitus with diabetic chronic kidney disease: Secondary | ICD-10-CM

## 2019-06-24 NOTE — Telephone Encounter (Signed)
Medication Refill - Medication: Empagliflozin-metFORMIN HCl ER (SYNJARDY XR) 25-1000 MG TB24    Has the patient contacted their pharmacy? Yes.   (Agent: If no, request that the patient contact the pharmacy for the refill.) (Agent: If yes, when and what did the pharmacy advise?)  Preferred Pharmacy (with phone number or street name): pt asking for 30 day supply to go to [facility] [address] And [facility]  [facility] and has not heard [facility]. Pt is out of meds.   Agent: Please be advised that RX refills may take up to 3 business days. We ask that you follow-up with your pharmacy.

## 2019-06-24 NOTE — Telephone Encounter (Signed)
Left detailed voicemail pt should still have 1 refill, and to contact pharmacy

## 2019-06-25 ENCOUNTER — Other Ambulatory Visit: Payer: Self-pay | Admitting: Family Medicine

## 2019-06-25 DIAGNOSIS — E1122 Type 2 diabetes mellitus with diabetic chronic kidney disease: Secondary | ICD-10-CM

## 2019-06-25 MED ORDER — SYNJARDY XR 25-1000 MG PO TB24: 1.0000 | ORAL_TABLET | Freq: Every day | ORAL | 0 refills | Status: DC

## 2019-06-25 NOTE — Telephone Encounter (Signed)
Pt called and stated that she is out and would like to know if a few can be called in to the [place] until medication is sent from mail order. Please advise    [phone]

## 2019-06-25 NOTE — Telephone Encounter (Signed)
Pt called pharm and they said that the pharm said that the refill ran out in May. Please refill rx

## 2019-06-25 NOTE — Telephone Encounter (Signed)
Please resend to pharmacy

## 2019-06-25 NOTE — Addendum Note (Signed)
Addended by: Steele Sizer F on: 06/25/2019 05:05 PM   Modules accepted: Orders

## 2019-06-25 NOTE — Addendum Note (Signed)
Addended by: Docia Furl on: 06/25/2019 10:42 AM   Modules accepted: Orders

## 2019-07-22 DIAGNOSIS — E890 Postprocedural hypothyroidism: Secondary | ICD-10-CM | POA: Diagnosis not present

## 2019-07-29 DIAGNOSIS — E890 Postprocedural hypothyroidism: Secondary | ICD-10-CM | POA: Diagnosis not present

## 2019-08-12 ENCOUNTER — Other Ambulatory Visit: Payer: Self-pay | Admitting: Physician Assistant

## 2019-08-12 DIAGNOSIS — I119 Hypertensive heart disease without heart failure: Secondary | ICD-10-CM

## 2019-09-08 DIAGNOSIS — Z03818 Encounter for observation for suspected exposure to other biological agents ruled out: Secondary | ICD-10-CM | POA: Diagnosis not present

## 2019-09-08 DIAGNOSIS — R6889 Other general symptoms and signs: Secondary | ICD-10-CM | POA: Diagnosis not present

## 2019-09-11 ENCOUNTER — Ambulatory Visit: Payer: Self-pay

## 2019-09-11 NOTE — Telephone Encounter (Signed)
Incoming call from Patient  With complaint of feeling dizzy and passing out.  Rates dizziness as severe.  Onset was Wednesday.  Changing head position makes the dizziness worse.   Unable to assess heart rate.  Tested at [facility] for covid and flu.  Received negative results on both.  Reports an fever.  Encouraged Pt. To push fluids.  Pt. Voiced understanding.          Reason for Disposition . SEVERE dizziness (e.g., unable to stand, requires support to walk, feels like passing out now)  Answer Assessment - Initial Assessment Questions 1. DESCRIPTION: "Describe your dizziness."    Broke out in bad sweat. Last night. 2. LIGHTHEADED: "Do you feel lightheaded?" (e.g., somewhat faint, woozy, weak upon standing)     *No Answer* 3. VERTIGO: "Do you feel like either you or the room is spinning or tilting?" (i.e. vertigo)     *No Answer* 4. SEVERITY: "How bad is it?"  "Do you feel like you are going to faint?" "Can you stand and walk?"   - MILD - walking normally   - MODERATE - interferes with normal activities (e.g., work, school)    - SEVERE - unable to stand, requires support to walk, feels like passing out now.    Severe 5. ONSET:  "When did the dizziness begin?"    Wednesday 6. AGGRAVATING FACTORS: "Does anything make it worse?" (e.g., standing, change in head position)     Changing head position.  7. HEART RATE: "Can you tell me your heart rate?" "How many beats in 15 seconds?"  (Note: not all patients can do this)       8. CAUSE: "What do you think is causing the dizziness?"    No idea 9. RECURRENT SYMPTOM: "Have you had dizziness before?" If so, ask: "When was the last time?" "What happened that time?"    Yes when I had the flu 10. OTHER SYMPTOMS: "Do you have any other symptoms?" (e.g., fever, chest pain, vomiting, diarrhea, bleeding)       denies 11. PREGNANCY: "Is there any chance you are pregnant?" "When was your last menstrual period?"      na  Protocols used:  DIZZINESS [facility]

## 2019-09-15 ENCOUNTER — Emergency Department
Admission: EM | Admit: 2019-09-15 | Discharge: 2019-09-15 | Disposition: A | Discharge status: Home / Self Care | Payer: BC Managed Care – PPO | Attending: Emergency Medicine | Admitting: Emergency Medicine

## 2019-09-15 ENCOUNTER — Telehealth: Payer: Self-pay

## 2019-09-15 ENCOUNTER — Other Ambulatory Visit: Payer: Self-pay

## 2019-09-15 ENCOUNTER — Encounter: Payer: Self-pay | Admitting: Emergency Medicine

## 2019-09-15 DIAGNOSIS — U071 COVID-19: Secondary | ICD-10-CM | POA: Diagnosis not present

## 2019-09-15 DIAGNOSIS — Z7984 Long term (current) use of oral hypoglycemic drugs: Secondary | ICD-10-CM | POA: Insufficient documentation

## 2019-09-15 DIAGNOSIS — Z20828 Contact with and (suspected) exposure to other viral communicable diseases: Secondary | ICD-10-CM | POA: Diagnosis not present

## 2019-09-15 DIAGNOSIS — Z79899 Other long term (current) drug therapy: Secondary | ICD-10-CM | POA: Insufficient documentation

## 2019-09-15 DIAGNOSIS — B349 Viral infection, unspecified: Secondary | ICD-10-CM | POA: Diagnosis not present

## 2019-09-15 DIAGNOSIS — R300 Dysuria: Secondary | ICD-10-CM | POA: Diagnosis not present

## 2019-09-15 DIAGNOSIS — I129 Hypertensive chronic kidney disease with stage 1 through stage 4 chronic kidney disease, or unspecified chronic kidney disease: Secondary | ICD-10-CM | POA: Insufficient documentation

## 2019-09-15 DIAGNOSIS — E1122 Type 2 diabetes mellitus with diabetic chronic kidney disease: Secondary | ICD-10-CM | POA: Insufficient documentation

## 2019-09-15 DIAGNOSIS — N181 Chronic kidney disease, stage 1: Secondary | ICD-10-CM | POA: Insufficient documentation

## 2019-09-15 DIAGNOSIS — R509 Fever, unspecified: Secondary | ICD-10-CM | POA: Diagnosis present

## 2019-09-15 MED ORDER — ONDANSETRON 4 MG PO TBDP: 4.0000 mg | ORAL_TABLET | Freq: Three times a day (TID) | ORAL | 0 refills | Status: DC | PRN

## 2019-09-15 NOTE — ED Triage Notes (Signed)
Pt to ED from home c/o fever at home over 1 week, does not have anything at home to check with though.  Denies pain, SOB, cough, or other symptoms.  Chest rise even and unlabored, skin WNL, in NAD at this time.  COVID test last Tuesday was negative.

## 2019-09-15 NOTE — Telephone Encounter (Signed)
Copied from [place] [phone]. Topic: General - Other >> Sep 15, 2019  8:28 AM Rayann Heman wrote: Reason for CRM: pt called and stated that she has not received a call back from the nurse. Pt states that she would like a call back from the nurse as soon as possible. Pt states that she is not passing out or feels like she will. Please see nurse triage encounter from 09/11/19. >> Sep 15, 2019 11:09 AM Vonna Kotyk, CMA wrote:  Telephone note was created and forward to Dr. Ancil Boozer with patient symptoms.

## 2019-09-15 NOTE — ED Provider Notes (Signed)
[facility] Emergency Department Provider Note  ____________________________________________  Time seen: Approximately 10:19 PM  I have reviewed the triage vital signs and the nursing notes.   HISTORY  Chief Complaint Fever   HPI Andrea Randolph is a 63 y.o. female presenting to the emergency department for concerns of subjective fever.  Patient states that she has felt poorly for the past week.  She had a COVID-19 test which came back negative.  She states that despite the negative test, she is continues to feel as if she has a fever but every morning before going into work, they take their temperature and she is afebrile.  She states that she has had a few episodes of diarrhea and a mild sore throat.  Otherwise, she denies cough shortness, shortness of breath, headache, loss of sense of taste or smell.     Past Medical History:  Diagnosis Date  . Allergy   . Anemia   . Arthritis of right shoulder region    Dr. Tamala Julian  . Chronic insomnia   . Diabetes mellitus without complication ([place])   . Goiter    Dr. Gabriel Carina  . Hyperlipidemia   . Hypertension   . Hypertensive retinopathy of left eye   . Hypothyroidism, adult    Dr. Gabriel Carina  . Inflammation of urethra   . Menopause   . Microalbuminuria    DM  . Mixed incontinence   . Muscle cramps   . Obesity   . OSA (obstructive sleep apnea)   . Radiculitis, lumbosacral   . Vitamin D deficiency     Patient Active Problem List   Diagnosis Date Noted  . Encounter for screening colonoscopy   . Severe obesity (BMI 35.0-35.9 with comorbidity) ([place]) 12/29/2018  . Mild concentric left ventricular hypertrophy (LVH) 12/24/2017  . Mild aortic stenosis by prior echocardiogram 12/24/2017  . Mild aortic regurgitation 12/24/2017  . CKD (chronic kidney disease), stage I 06/22/2016  . Uterine fibroid 01/11/2016  . Allergic rhinitis, seasonal 05/24/2015  . Benign essential HTN 05/24/2015  . Chronic constipation 05/24/2015  .  Diabetes mellitus with renal manifestation ([place]) 05/24/2015  . Dyslipidemia 05/24/2015  . Post-surgical hypothyroidism 05/24/2015  . Mixed incontinence 05/24/2015  . Microalbuminuria 05/24/2015  . Menopause 05/24/2015  . Obstructive apnea 05/24/2015  . Adult BMI 30+ 05/24/2015  . Hypertensive retinopathy 05/24/2015  . Vitamin D deficiency 05/24/2015  . Status post total replacement of right shoulder 03/11/2015    Past Surgical History:  Procedure Laterality Date  . BREAST BIOPSY Right 90's   benign core needle   . COLONOSCOPY WITH PROPOFOL N/A 01/26/2019   Procedure: COLONOSCOPY WITH PROPOFOL;  Surgeon: Lin Landsman, MD;  Location: [facility] ENDOSCOPY;  Service: Gastroenterology;  Laterality: N/A;  . FOOT SURGERY    . TOTAL SHOULDER REPLACEMENT Right 01/25/2015   Dr. Roland Rack  . TOTAL THYROIDECTOMY  08/01/2018    Prior to Admission medications   Medication Sig Start Date End Date Taking? Authorizing Provider  aspirin 81 MG tablet Take by mouth. 12/27/10   [provider]  atorvastatin (LIPITOR) 20 MG tablet Take 1 tablet (20 mg total) by mouth daily. 02/11/19 05/12/19  Rise Mu, PA-C  Cholecalciferol (VITAMIN D3) 1000 units CAPS Take 2 capsules by mouth daily.    [provider]  Empagliflozin-metFORMIN HCl ER (SYNJARDY XR) 25-1000 MG TB24 Take 1 tablet by mouth daily. 06/25/19   Steele Sizer, MD  ezetimibe (ZETIA) 10 MG tablet Take 1 tablet (10 mg total) by  mouth daily. 02/11/19 05/12/19  Dunn, Areta Haber, PA-C  glucose blood (FREESTYLE LITE) test strip FREESTYLE LITE TEST (In Vitro Strip)  use as directed for 0 days  Quantity: 200.00;  Refills: 3   Ordered :25-Nov-2014  Vonna Kotyk ;  Started 30-Dec-2008 Active Comments: DX: 250.00 12/30/08   [provider]  ketoconazole (NIZORAL) 2 % cream Apply 1 application topically daily. 06/26/18   Steele Sizer, MD  levothyroxine (SYNTHROID, LEVOTHROID) 125 MCG tablet Take 1 tablet by mouth daily. 12/01/18    Judi Cong, MD  Multiple Vitamin (MULTIVITAMIN) tablet Take 1 tablet by mouth daily.    [provider]  Olmesartan-amLODIPine-HCTZ 40-5-25 MG TABS TAKE 1 TABLET DAILY 08/12/19   Rise Mu, PA-C  ondansetron (ZOFRAN-ODT) 4 MG disintegrating tablet Take 1 tablet (4 mg total) by mouth every 8 (eight) hours as needed for nausea or vomiting. 09/15/19   Victorino Dike, FNP    Allergies Patient has no known allergies.  Family History  Problem Relation Age of Onset  . Heart disease Mother   . Heart disease Father   . Kidney disease Sister   . Hypertension Sister   . Diabetes Sister   . Hypertension Brother   . CVA Brother   . Breast cancer Maternal Aunt 28  . Hypertension Sister   . Kidney failure Sister   . Ovarian cancer Neg Hx   . Colon cancer Neg Hx     Social History Social History   Tobacco Use  . Smoking status: Never Smoker  . Smokeless tobacco: Never Used  Substance Use Topics  . Alcohol use: No    Alcohol/week: 0.0 standard drinks  . Drug use: No    Review of Systems Constitutional: Subjective fever/chills.  Decreased appetite. ENT: Positive for sore throat. Cardiovascular: Denies chest pain. Respiratory: Negative for shortness of breath.  Negative for cough.  Negative for wheezing.  Gastrointestinal: No nausea, no vomiting.  Positive for diarrhea.  Musculoskeletal: Negative for body aches Skin: Positive for rash. Neurological: Negative for headaches ____________________________________________   PHYSICAL EXAM:  VITAL SIGNS: ED Triage Vitals  Enc Vitals Group     BP 09/15/19 1703 109/68     Pulse Rate 09/15/19 1703 81     Resp 09/15/19 1703 18     Temp 09/15/19 1703 98.9 F (37.2 C)     Temp Source 09/15/19 1703 Oral     SpO2 09/15/19 1703 94 %     Weight 09/15/19 1704 225 lb (102.1 kg)     Height 09/15/19 1704 5\' 5"  (1.651 m)     Head Circumference --      Peak Flow --      Pain Score 09/15/19 1712 0     Pain Loc --      Pain Edu?  --      Excl. in [place]? --     Constitutional: Alert and oriented.  Overall well appearing and in no acute distress. Eyes: Conjunctivae are normal. Ears: Tympanic membranes are normal Nose: No sinus congestion noted; no rhinnorhea. Mouth/Throat: Mucous membranes are moist.  Oropharynx clear. Tonsils normal. Uvula midline. Neck: No stridor.  Lymphatic: No cervical lymphadenopathy. Cardiovascular: Normal rate, regular rhythm. Good peripheral circulation. Respiratory: Respirations are even and unlabored.  No retractions.  Clear to auscultation. Gastrointestinal: Soft and nontender.  Musculoskeletal: FROM x 4 extremities.  Neurologic:  Normal speech and language. Skin:  Skin is warm, dry and intact. No rash noted. Psychiatric: Mood and affect are normal. Speech  and behavior are normal.  ____________________________________________   LABS (all labs ordered are listed, but only abnormal results are displayed)  Labs Reviewed  SARS CORONAVIRUS 2 (TAT 6-24 HRS)   ____________________________________________  EKG  Not indicated ____________________________________________  RADIOLOGY  Not indicated ____________________________________________   PROCEDURES  Procedure(s) performed: None  Critical Care performed: No ____________________________________________   INITIAL IMPRESSION / ASSESSMENT AND PLAN / ED COURSE  63 y.o. female presents to the emergency department for fatigue and malaise with symptoms as described in HPI.  Exam is overall reassuring.  Patient requested a repeat COVID-19 test.  She will be tested and advised not to return to work until the test is back.  Negative, she may return to work as long as she is no fever.  Positive she needs to quarantine herself for 2 weeks.  She was advised to see her primary care provider or return to the emergency department for symptoms of concern.    Medications - No data to display  ED Discharge Orders         Ordered     ondansetron (ZOFRAN-ODT) 4 MG disintegrating tablet  Every 8 hours PRN     09/15/19 1907           Pertinent labs & imaging results that were available during my care of the patient were reviewed by me and considered in my medical decision making (see chart for details).    If controlled substance prescribed during this visit, 12 month history viewed on the [place] prior to issuing an initial prescription for Schedule II or III opiod. ____________________________________________   FINAL CLINICAL IMPRESSION(S) / ED DIAGNOSES  Final diagnoses:  Acute viral syndrome    Note:  This document was prepared using Dragon voice recognition software and may include unintentional dictation errors.    Victorino Dike, FNP 09/15/19 2224    Blake Divine, MD 09/15/19 2332

## 2019-09-15 NOTE — ED Notes (Signed)
See triage note  Brought over from [facility]  States she was exposed to [place] about 9-10 days ago   Was seen at [facility]   Had flu and COVID swabs done which were negative  States she still is having low grade fever and just doesn't feel right

## 2019-09-15 NOTE — Telephone Encounter (Addendum)
Still having a low grade fever, pain in her left ear-unbearable, nausea, low appetite, still dizzy and states she has not had passed out recently. Having puffy eyes in the morning.     Taking Tylenol one a day and not helping symptoms that Urgent Care told her to take and taking Theraflu.   Pulse-84 Bp-114/78   Patient was seen by [facility] walk in and was told to call back if her symptoms did not go away. Advised the patient to go to ER per Dr. Ancil Boozer recommendations or call [facility] back and inform them symptoms have not resolved.

## 2019-09-15 NOTE — Telephone Encounter (Signed)
Copied from [place] [id number]. Topic: General - Other >> Sep 15, 2019  8:28 AM Andrea Randolph wrote: Reason for CRM: pt called and stated that she has not received a call back from the nurse. Pt states that she would like a call back from the nurse as soon as possible. Pt states that she is not passing out or feels like she will. Please see nurse triage encounter from 09/11/19.

## 2019-09-16 ENCOUNTER — Telehealth: Payer: Self-pay | Admitting: *Deleted

## 2019-09-16 LAB — SARS CORONAVIRUS 2 (TAT 6-24 HRS): SARS Coronavirus 2: POSITIVE — AB

## 2019-09-16 NOTE — Telephone Encounter (Signed)
Reviewed positive Covid19 resutls with patient. Discussed isolation precautions including staying home and not around others. Disinfect areas you come in contact with, wash your hands frequently. Reviewed end date and requirements to end isolation. Encouraged fluids and rest. Call your doctor for any breathing concerns. Stated she understood. Will notify [facility] Department.

## 2019-09-17 ENCOUNTER — Encounter: Payer: BLUE CROSS/BLUE SHIELD | Admitting: Family Medicine

## 2019-09-30 ENCOUNTER — Ambulatory Visit: Payer: BLUE CROSS/BLUE SHIELD | Admitting: Family Medicine

## 2019-10-26 ENCOUNTER — Encounter: Payer: Self-pay | Admitting: Family Medicine

## 2019-10-26 ENCOUNTER — Other Ambulatory Visit: Payer: Self-pay

## 2019-10-26 ENCOUNTER — Ambulatory Visit (INDEPENDENT_AMBULATORY_CARE_PROVIDER_SITE_OTHER): Payer: BC Managed Care – PPO | Admitting: Family Medicine

## 2019-10-26 VITALS — BP 140/78 | HR 65 | Temp 97.1°F | Resp 16 | Ht 64.0 in | Wt 219.8 lb

## 2019-10-26 DIAGNOSIS — M25562 Pain in left knee: Secondary | ICD-10-CM

## 2019-10-26 DIAGNOSIS — N183 Chronic kidney disease, stage 3 unspecified: Secondary | ICD-10-CM | POA: Diagnosis not present

## 2019-10-26 DIAGNOSIS — E785 Hyperlipidemia, unspecified: Secondary | ICD-10-CM

## 2019-10-26 DIAGNOSIS — E1159 Type 2 diabetes mellitus with other circulatory complications: Secondary | ICD-10-CM | POA: Diagnosis not present

## 2019-10-26 DIAGNOSIS — I119 Hypertensive heart disease without heart failure: Secondary | ICD-10-CM

## 2019-10-26 DIAGNOSIS — I152 Hypertension secondary to endocrine disorders: Secondary | ICD-10-CM

## 2019-10-26 DIAGNOSIS — Z23 Encounter for immunization: Secondary | ICD-10-CM | POA: Diagnosis not present

## 2019-10-26 DIAGNOSIS — R300 Dysuria: Secondary | ICD-10-CM

## 2019-10-26 DIAGNOSIS — I10 Essential (primary) hypertension: Secondary | ICD-10-CM

## 2019-10-26 DIAGNOSIS — M25561 Pain in right knee: Secondary | ICD-10-CM

## 2019-10-26 DIAGNOSIS — E1122 Type 2 diabetes mellitus with diabetic chronic kidney disease: Secondary | ICD-10-CM | POA: Diagnosis not present

## 2019-10-26 LAB — POCT GLYCOSYLATED HEMOGLOBIN (HGB A1C): Hemoglobin A1C: 7.1 % — AB (ref 4.0–5.6)

## 2019-10-26 MED ORDER — XIGDUO XR 10-1000 MG PO TB24: 1.0000 | ORAL_TABLET | Freq: Every day | ORAL | 1 refills | Status: DC

## 2019-10-26 MED ORDER — DICLOFENAC SODIUM 1 % EX GEL: 2.0000 g | Freq: Four times a day (QID) | CUTANEOUS | 0 refills | Status: DC

## 2019-10-26 NOTE — Progress Notes (Signed)
Name: Andrea Randolph   MRN: [id number]    DOB: Mar 11, 1956   Date:10/26/2019       Progress Note  Subjective  Chief Complaint  Chief Complaint  Patient presents with  . Follow-up    6 month follow up  . Stomach Discomfort    Onset since previously having COVID  . Urinary Pressure    Onset for a month on and off    HPI  History of COVID-19: she went to Urgent Care on 09/08/2019, initial test was negative, but went back on 09/15/2019 because symptoms were still present with body aches, headache, fever and nausea. Sore throat already resolved. She was given zofran. She states all symptoms resolved except for episodes of indigestion.   Dysuria: going on intermittently for the past month, at times has an odor, she has been cranberry juice. No flank pain or fever, we will check urine culture   DMII: she states she was on Rybelsus and stopped Synjardi, but than decided to stop Rybelsus because she was not losing weight and is back on Synjardi, she has lost weight again since last visit, she denies side effects of medication   Surgical hypothyroidism: last visit with Dr. Gabriel Carina was 07/2019 and TSH was 0.593 she is currently taking 125 mcg daily , no change in bowel movements , no hair loss or dry skin   Morbid obesity: she is doing better on her diet, lost more weight since last visit, down 6 lbs, continue hard work   Bilateral knee pain: she states it has been present for the past few months, worse when sitting or at night, no redness or increase in warmth. Pain is described as aching, she states feels stiff when she first gets out of bed. Advised Tylenol 500 to 650 mg at night and topical voltaren gel   Hyperlipidemia: on statin and Zetia, denies myalgias. She also has LVH under the care of cardiologist , denies side effects of medication . She still has medications at home    Patient Active Problem List   Diagnosis Date Noted  . Encounter for screening colonoscopy   . Severe obesity  (BMI 35.0-35.9 with comorbidity) ([place]) 12/29/2018  . Mild concentric left ventricular hypertrophy (LVH) 12/24/2017  . Mild aortic stenosis by prior echocardiogram 12/24/2017  . Mild aortic regurgitation 12/24/2017  . CKD (chronic kidney disease), stage I 06/22/2016  . Uterine fibroid 01/11/2016  . Allergic rhinitis, seasonal 05/24/2015  . Benign essential HTN 05/24/2015  . Chronic constipation 05/24/2015  . Diabetes mellitus with renal manifestation ([place]) 05/24/2015  . Dyslipidemia 05/24/2015  . Post-surgical hypothyroidism 05/24/2015  . Mixed incontinence 05/24/2015  . Microalbuminuria 05/24/2015  . Menopause 05/24/2015  . Obstructive apnea 05/24/2015  . Adult BMI 30+ 05/24/2015  . Hypertensive retinopathy 05/24/2015  . Vitamin D deficiency 05/24/2015  . Status post total replacement of right shoulder 03/11/2015    Past Surgical History:  Procedure Laterality Date  . BREAST BIOPSY Right 90's   benign core needle   . COLONOSCOPY WITH PROPOFOL N/A 01/26/2019   Procedure: COLONOSCOPY WITH PROPOFOL;  Surgeon: Lin Landsman, MD;  Location: [facility] ENDOSCOPY;  Service: Gastroenterology;  Laterality: N/A;  . FOOT SURGERY    . TOTAL SHOULDER REPLACEMENT Right 01/25/2015   Dr. Roland Rack  . TOTAL THYROIDECTOMY  08/01/2018    Family History  Problem Relation Age of Onset  . Heart disease Mother   . Heart disease Father   . Kidney disease Sister   . Hypertension Sister   .  Diabetes Sister   . Hypertension Brother   . CVA Brother   . Breast cancer Maternal Aunt 29  . Hypertension Sister   . Kidney failure Sister   . Ovarian cancer Neg Hx   . Colon cancer Neg Hx     Social History   Socioeconomic History  . Marital status: Legally Separated    Spouse name: Not on file  . Number of children: 1  . Years of education: Not on file  . Highest education level: 12th grade  Occupational History  . Occupation: Cytogeneticist  Tobacco Use  . Smoking status: Never Smoker  .  Smokeless tobacco: Never Used  Substance and Sexual Activity  . Alcohol use: No    Alcohol/week: 0.0 standard drinks  . Drug use: No  . Sexual activity: Not Currently    Birth control/protection: Post-menopausal  Other Topics Concern  . Not on file  Social History Narrative   Separated in around 2009.   She was living with her daughter, however currently moved in with her sister and nephew , but is looking for a house.    Social Determinants of Health   Financial Resource Strain:   . Difficulty of Paying Living Expenses: Not on file  Food Insecurity:   . Worried About Charity fundraiser in the Last Year: Not on file  . Ran Out of Food in the Last Year: Not on file  Transportation Needs:   . Lack of Transportation (Medical): Not on file  . Lack of Transportation (Non-Medical): Not on file  Physical Activity:   . Days of Exercise per Week: Not on file  . Minutes of Exercise per Session: Not on file  Stress:   . Feeling of Stress : Not on file  Social Connections:   . Frequency of Communication with Friends and Family: Not on file  . Frequency of Social Gatherings with Friends and Family: Not on file  . Attends Religious Services: Not on file  . Active Member of Clubs or Organizations: Not on file  . Attends Archivist Meetings: Not on file  . Marital Status: Not on file  Intimate Partner Violence:   . Fear of Current or Ex-Partner: Not on file  . Emotionally Abused: Not on file  . Physically Abused: Not on file  . Sexually Abused: Not on file     Current Outpatient Medications:  .  aspirin 81 MG tablet, Take by mouth., Disp: , Rfl:  .  Cholecalciferol (VITAMIN D3) 1000 units CAPS, Take 2 capsules by mouth daily., Disp: , Rfl:  .  Empagliflozin-metFORMIN HCl ER (SYNJARDY XR) 25-1000 MG TB24, Take 1 tablet by mouth daily., Disp: 30 tablet, Rfl: 0 .  glucose blood (FREESTYLE LITE) test strip, FREESTYLE LITE TEST (In Vitro Strip)  use as directed for 0 days   Quantity: 200.00;  Refills: 3   Ordered :25-Nov-2014  Vonna Kotyk ;  Started 30-Dec-2008 Active Comments: DX: 250.00, Disp: , Rfl:  .  ketoconazole (NIZORAL) 2 % cream, Apply 1 application topically daily., Disp: 60 g, Rfl: 0 .  levothyroxine (SYNTHROID, LEVOTHROID) 125 MCG tablet, Take 1 tablet by mouth daily., Disp: , Rfl:  .  Multiple Vitamin (MULTIVITAMIN) tablet, Take 1 tablet by mouth daily., Disp: , Rfl:  .  Olmesartan-amLODIPine-HCTZ 40-5-25 MG TABS, TAKE 1 TABLET DAILY, Disp: 90 tablet, Rfl: 1 .  atorvastatin (LIPITOR) 20 MG tablet, Take 1 tablet (20 mg total) by mouth daily., Disp: 90 tablet, Rfl: 3 .  ezetimibe (ZETIA) 10 MG tablet, Take 1 tablet (10 mg total) by mouth daily., Disp: 90 tablet, Rfl: 2 .  ondansetron (ZOFRAN-ODT) 4 MG disintegrating tablet, Take 1 tablet (4 mg total) by mouth every 8 (eight) hours as needed for nausea or vomiting. (Patient not taking: Reported on 10/26/2019), Disp: 20 tablet, Rfl: 0  No Known Allergies  I personally reviewed active problem list, medication list, allergies, family history with the patient/caregiver today.   ROS  Constitutional: Negative for fever , positive for  weight change.  Respiratory: Negative for cough and shortness of breath.   Cardiovascular: Negative for chest pain or palpitations.  Gastrointestinal: Negative for abdominal pain, no bowel changes.  Musculoskeletal: Negative for gait problem or joint swelling.  Skin: Negative for rash.  Neurological: Negative for dizziness or headache.  No other specific complaints in a complete review of systems (except as listed in HPI above).  Objective  Vitals:   10/26/19 1026  BP: 140/78  Pulse: 65  Resp: 16  Temp: (!) 97.1 F (36.2 C)  SpO2: (!) 76%  Weight: 219 lb 12.8 oz (99.7 kg)  Height: 5\' 4"  (1.626 m)    Body mass index is 37.73 kg/m.  Physical Exam  Constitutional: Patient appears well-developed and well-nourished. Obese  No distress.  HEENT: head  atraumatic, normocephalic, pupils equal and reactive to light Cardiovascular: Normal rate, regular rhythm and normal heart sounds.  No murmur heard. No BLE edema. Pulmonary/Chest: Effort normal and breath sounds normal. No respiratory distress. Abdominal: Soft.  There is no tenderness. Negative CVA tenderness Muscular skeletal: no effusion, no redness or increase in warmth , crepitus with extension of both knees  Psychiatric: Patient has a normal mood and affect. behavior is normal. Judgment and thought content normal.  Recent Results (from the past 2160 hour(s))  SARS CORONAVIRUS 2 (TAT 6-24 HRS) Nasopharyngeal Nasopharyngeal Swab     Status: Abnormal   Collection Time: 09/15/19  7:25 PM   Specimen: Nasopharyngeal Swab  Result Value Ref Range   SARS Coronavirus 2 POSITIVE (A) NEGATIVE    Comment: RESULT CALLED TO, READ BACK BY AND VERIFIED WITH: H FISHER,RN 1656 09/16/2019 D BRADLEY (NOTE) SARS-CoV-2 target nucleic acids are DETECTED. The SARS-CoV-2 RNA is generally detectable in upper and lower respiratory specimens during the acute phase of infection. Positive results are indicative of active infection with SARS-CoV-2. Clinical  correlation with patient history and other diagnostic information is necessary to determine patient infection status. Positive results do  not rule out bacterial infection or co-infection with other viruses. The expected result is Negative. Fact Sheet for Patients: SugarRoll.be Fact Sheet for Healthcare Providers: https://www.woods-mathews.com/ This test is not yet approved or cleared by the [place] FDA and  has been authorized for detection and/or diagnosis of SARS-CoV-2 by FDA under an Emergency Use Authorization (EUA). This EUA will remain  in effect (meaning this test can be used) for  the duration of the COVID-19 declaration under Section 564(b)(1) of the Act, 21 U.S.C. section 360bbb-3(b)(1), unless the  authorization is terminated or revoked sooner. Performed at [facility], [place] [address] [place], [place] [address]   POCT HgB A1C     Status: Abnormal   Collection Time: 10/26/19 10:57 AM  Result Value Ref Range   Hemoglobin A1C 7.1 (A) 4.0 - 5.6 %   HbA1c POC (<> result, manual entry)     HbA1c, POC (prediabetic range)     HbA1c, POC (controlled diabetic range)  PHQ2/9: Depression screen [facility] 2/9 10/26/2019 03/30/2019 12/29/2018 09/29/2018 06/26/2018  Decreased Interest 0 0 1 0 0  Down, Depressed, Hopeless 0 0 0 0 0  PHQ - 2 Score 0 0 1 0 0  Altered sleeping 0 1 2 0 -  Tired, decreased energy 0 0 1 0 -  Change in appetite 0 0 3 0 -  Feeling bad or failure about yourself  0 0 0 0 -  Trouble concentrating 0 0 1 0 -  Moving slowly or fidgety/restless 0 0 0 0 -  Suicidal thoughts 0 0 0 0 -  PHQ-9 Score 0 1 8 0 -  Difficult doing work/chores Not difficult at all Not difficult at all Not difficult at all Not difficult at all -    phq 9 is negative   Fall Risk: Fall Risk  10/26/2019 03/30/2019 12/29/2018 09/29/2018 06/26/2018  Falls in the past year? 0 0 0 0 No  Number falls in past yr: 0 0 - - -  Injury with Fall? 0 0 - - -     Functional Status Survey: Is the patient deaf or have difficulty hearing?: No Does the patient have difficulty seeing, even when wearing glasses/contacts?: No Does the patient have difficulty concentrating, remembering, or making decisions?: No Does the patient have difficulty walking or climbing stairs?: No Does the patient have difficulty dressing or bathing?: No Does the patient have difficulty doing errands alone such as visiting a doctor's office or shopping?: No    Assessment & Plan  1. Controlled type 2 diabetes mellitus with stage 3 chronic kidney disease, without long-term current use of insulin (HCC)  - POCT HgB A1C - Dapagliflozin-metFORMIN HCl ER (XIGDUO XR) 08-999 MG TB24; Take 1 tablet by mouth daily.  Dispense: 90 tablet;  Refill: 1  2. Needs flu shot  - Flu Vaccine QUAD 6+ mos PF IM (Fluarix Quad PF)  3. Benign essential HTN  At goal   4. Dyslipidemia  On statin and zetia   5. Hypertension associated with diabetes ([place])   6. Hypertensive left ventricular hypertrophy, without heart failure   7. Pain in both knees, unspecified chronicity  - diclofenac Sodium (VOLTAREN) 1 % GEL; Apply 2 g topically 4 (four) times daily.  Dispense: 100 g; Refill: 0   8. Dysuria  - CULTURE, URINE COMPREHENSIVE - Urinalysis, Complete

## 2019-10-27 LAB — URINALYSIS, COMPLETE
Bacteria, UA: NONE SEEN /HPF
Bilirubin Urine: NEGATIVE
Hgb urine dipstick: NEGATIVE
Hyaline Cast: NONE SEEN /LPF
Ketones, ur: NEGATIVE
Leukocytes,Ua: NEGATIVE
Nitrite: NEGATIVE
Protein, ur: NEGATIVE
RBC / HPF: NONE SEEN /HPF (ref 0–2)
Specific Gravity, Urine: 1.025 (ref 1.001–1.03)
pH: 6.5 (ref 5.0–8.0)

## 2019-10-27 LAB — CULTURE, URINE COMPREHENSIVE
MICRO NUMBER:: 1195791
SPECIMEN QUALITY:: ADEQUATE

## 2019-10-27 LAB — COMPLETE METABOLIC PANEL WITH GFR
AG Ratio: 1.3 (calc) (ref 1.0–2.5)
ALT: 20 U/L (ref 6–29)
AST: 21 U/L (ref 10–35)
Albumin: 4.1 g/dL (ref 3.6–5.1)
Alkaline phosphatase (APISO): 38 U/L (ref 37–153)
BUN/Creatinine Ratio: 11 (calc) (ref 6–22)
BUN: 13 mg/dL (ref 7–25)
CO2: 30 mmol/L (ref 20–32)
Calcium: 9.7 mg/dL (ref 8.6–10.4)
Chloride: 105 mmol/L (ref 98–110)
Creat: 1.21 mg/dL — ABNORMAL HIGH (ref 0.50–0.99)
GFR, Est African American: 56 mL/min/{1.73_m2} — ABNORMAL LOW (ref >=60)
GFR, Est Non African American: 48 mL/min/{1.73_m2} — ABNORMAL LOW (ref >=60)
Globulin: 3.1 g/dL (calc) (ref 1.9–3.7)
Glucose, Bld: 104 mg/dL — ABNORMAL HIGH (ref 65–99)
Potassium: 3.9 mmol/L (ref 3.5–5.3)
Sodium: 144 mmol/L (ref 135–146)
Total Bilirubin: 0.6 mg/dL (ref 0.2–1.2)
Total Protein: 7.2 g/dL (ref 6.1–8.1)

## 2019-10-27 LAB — CBC WITH DIFFERENTIAL/PLATELET
Absolute Monocytes: 329 cells/uL (ref 200–950)
Basophils Absolute: 38 cells/uL (ref 0–200)
Basophils Relative: 0.8 %
Eosinophils Absolute: 160 cells/uL (ref 15–500)
Eosinophils Relative: 3.4 %
HCT: 39.7 % (ref 35.0–45.0)
Hemoglobin: 13 g/dL (ref 11.7–15.5)
Lymphs Abs: 2063 cells/uL (ref 850–3900)
MCH: 27 pg (ref 27.0–33.0)
MCHC: 32.7 g/dL (ref 32.0–36.0)
MCV: 82.5 fL (ref 80.0–100.0)
MPV: 9.5 fL (ref 7.5–12.5)
Monocytes Relative: 7 %
Neutro Abs: 2110 cells/uL (ref 1500–7800)
Neutrophils Relative %: 44.9 %
Platelets: 407 10*3/uL — ABNORMAL HIGH (ref 140–400)
RBC: 4.81 10*6/uL (ref 3.80–5.10)
RDW: 14.7 % (ref 11.0–15.0)
Total Lymphocyte: 43.9 %
WBC: 4.7 10*3/uL (ref 3.8–10.8)

## 2019-10-27 LAB — MICROALBUMIN / CREATININE URINE RATIO
Creatinine, Urine: 95 mg/dL (ref 20–275)
Microalb Creat Ratio: 35 mcg/mg creat — ABNORMAL HIGH (ref <30)
Microalb, Ur: 3.3 mg/dL

## 2019-10-27 LAB — LIPID PANEL
Cholesterol: 154 mg/dL (ref <200)
HDL: 41 mg/dL — ABNORMAL LOW (ref >=50)
LDL Cholesterol (Calc): 98 mg/dL (calc)
Non-HDL Cholesterol (Calc): 113 mg/dL (calc) (ref <130)
Total CHOL/HDL Ratio: 3.8 (calc) (ref <5.0)
Triglycerides: 64 mg/dL (ref <150)

## 2019-11-01 ENCOUNTER — Encounter: Payer: Self-pay | Admitting: Family Medicine

## 2019-11-01 DIAGNOSIS — D75839 Thrombocytosis, unspecified: Secondary | ICD-10-CM | POA: Insufficient documentation

## 2019-11-01 DIAGNOSIS — D473 Essential (hemorrhagic) thrombocythemia: Secondary | ICD-10-CM | POA: Insufficient documentation

## 2019-11-23 ENCOUNTER — Encounter: Payer: Self-pay | Admitting: Family Medicine

## 2019-12-01 ENCOUNTER — Other Ambulatory Visit: Payer: Self-pay | Admitting: Family Medicine

## 2019-12-01 DIAGNOSIS — Z1231 Encounter for screening mammogram for malignant neoplasm of breast: Secondary | ICD-10-CM

## 2019-12-29 ENCOUNTER — Ambulatory Visit (INDEPENDENT_AMBULATORY_CARE_PROVIDER_SITE_OTHER): Payer: BC Managed Care – PPO | Admitting: Family Medicine

## 2019-12-29 ENCOUNTER — Encounter: Payer: Self-pay | Admitting: Family Medicine

## 2019-12-29 ENCOUNTER — Other Ambulatory Visit: Payer: Self-pay

## 2019-12-29 VITALS — BP 126/78 | HR 86 | Temp 97.2°F | Resp 18 | Ht 64.0 in | Wt 222.6 lb

## 2019-12-29 DIAGNOSIS — Z0000 Encounter for general adult medical examination without abnormal findings: Secondary | ICD-10-CM

## 2019-12-29 DIAGNOSIS — Z23 Encounter for immunization: Secondary | ICD-10-CM | POA: Diagnosis not present

## 2019-12-29 NOTE — Progress Notes (Signed)
Name: Andrea Randolph   MRN: [id number]    DOB: [birth year]   Date:12/29/2019       Progress Note  Subjective  Chief Complaint  Chief Complaint  Patient presents with  . Annual Exam    HPI  Patient presents for annual CPE.  Diet: discussed healthy medication  Exercise: she will try to increase to one extra day a week   USPSTF grade A and B recommendations    Office Visit from 12/29/2019 in [facility]  AUDIT-C Score  0     Depression: Phq 9 is  negative Depression screen [facility] 2/9 12/29/2019 10/26/2019 03/30/2019 12/29/2018 09/29/2018  Decreased Interest 0 0 0 1 0  Down, Depressed, Hopeless 0 0 0 0 0  PHQ - 2 Score 0 0 0 1 0  Altered sleeping 1 0 1 2 0  Tired, decreased energy 1 0 0 1 0  Change in appetite 1 0 0 3 0  Feeling bad or failure about yourself  0 0 0 0 0  Trouble concentrating 0 0 0 1 0  Moving slowly or fidgety/restless 0 0 0 0 0  Suicidal thoughts 0 0 0 0 0  PHQ-9 Score 3 0 1 8 0  Difficult doing work/chores Not difficult at all Not difficult at all Not difficult at all Not difficult at all Not difficult at all   Hypertension: BP Readings from Last 3 Encounters:  12/29/19 126/78  10/26/19 140/78  09/15/19 137/79   Obesity: Wt Readings from Last 3 Encounters:  12/29/19 222 lb 9.6 oz (101 kg)  10/26/19 219 lb 12.8 oz (99.7 kg)  09/15/19 225 lb (102.1 kg)   BMI Readings from Last 3 Encounters:  12/29/19 38.21 kg/m  10/26/19 37.73 kg/m  09/15/19 37.44 kg/m     Hep C Screening: up to date  STD testing and prevention (HIV/chl/gon/syphilis): not interested  Intimate partner violence: negative screen  Sexual History (Partners/Practices/Protection from [employer] hx STI/Pregnancy Plans): not sexually active for over one year  Pain during Intercourse: not sexually active  Menstrual History/LMP/Abnormal Bleeding: discussed post menopausal bleeding  Incontinence Symptoms: occasional urgency   Breast cancer:  - Last Mammogram: scheduled for  mammogram 12/31/2019 - BRCA gene screening: N/A  Osteoporosis: Discussed high calcium and vitamin D supplementation, weight bearing exercises  Cervical cancer screening: repeat in 2022   Skin cancer: Discussed monitoring for atypical lesions  Colorectal cancer: up to date  Lung cancer:   Low Dose CT Chest recommended if Age 63-80 years, 30 pack-year currently smoking OR have quit w/in 15years. Patient does not qualify.   ECG: 2020   Advanced Care Planning: A voluntary discussion about advance care planning including the explanation and discussion of advance directives.  Discussed health care proxy and Living will, and the patient was able to identify a health care proxy as daughter .  Patient does not have a living will at present time.  Lipids: Lab Results  Component Value Date   CHOL 154 10/26/2019   CHOL 145 12/29/2018   CHOL 153 03/26/2018   Lab Results  Component Value Date   HDL 41 (L) 10/26/2019   HDL 51 12/29/2018   HDL 44 (L) 03/26/2018   Lab Results  Component Value Date   LDLCALC 98 10/26/2019   LDLCALC 81 12/29/2018   LDLCALC 94 03/26/2018   Lab Results  Component Value Date   TRIG 64 10/26/2019   TRIG 53 12/29/2018   TRIG 60 03/26/2018   Lab Results  Component Value Date   CHOLHDL 3.8 10/26/2019   CHOLHDL 2.8 12/29/2018   CHOLHDL 3.5 03/26/2018   No results found for: LDLDIRECT  Glucose: Glucose, Bld  Date Value Ref Range Status  10/26/2019 104 (H) 65 - 99 mg/dL Final    Comment:    .            Fasting reference interval . For someone without known diabetes, a glucose value between 100 and 125 mg/dL is consistent with prediabetes and should be confirmed with a follow-up test. .   12/29/2018 103 (H) 65 - 99 mg/dL Final    Comment:    .            Fasting reference interval . For someone without known diabetes, a glucose value between 100 and 125 mg/dL is consistent with prediabetes and should be confirmed with a follow-up test. .    03/26/2018 103 (H) 65 - 99 mg/dL Final    Comment:    .            Fasting reference interval . For someone without known diabetes, a glucose value between 100 and 125 mg/dL is consistent with prediabetes and should be confirmed with a follow-up test. .    Glucose-Capillary  Date Value Ref Range Status  01/26/2019 98 70 - 99 mg/dL Final    Patient Active Problem List   Diagnosis Date Noted  . Thrombocytosis ([place]) 11/01/2019  . Carpal tunnel syndrome, bilateral 02/19/2019  . Trigger finger of right thumb 02/19/2019  . Flexor tenosynovitis of finger 02/19/2019  . Severe obesity (BMI 35.0-35.9 with comorbidity) ([place]) 12/29/2018  . Mild concentric left ventricular hypertrophy (LVH) 12/24/2017  . Mild aortic stenosis by prior echocardiogram 12/24/2017  . Mild aortic regurgitation 12/24/2017  . CKD (chronic kidney disease), stage I 06/22/2016  . Uterine fibroid 01/11/2016  . Allergic rhinitis, seasonal 05/24/2015  . Benign essential HTN 05/24/2015  . Chronic constipation 05/24/2015  . Diabetes mellitus with renal manifestation ([place]) 05/24/2015  . Dyslipidemia 05/24/2015  . Post-surgical hypothyroidism 05/24/2015  . Mixed incontinence 05/24/2015  . Microalbuminuria 05/24/2015  . Menopause 05/24/2015  . Obstructive apnea 05/24/2015  . Morbid obesity ([place]) 05/24/2015  . Hypertensive retinopathy 05/24/2015  . Vitamin D deficiency 05/24/2015  . Status post total replacement of right shoulder 03/11/2015    Past Surgical History:  Procedure Laterality Date  . BREAST BIOPSY Right 90's   benign core needle   . COLONOSCOPY WITH PROPOFOL N/A 01/26/2019   Procedure: COLONOSCOPY WITH PROPOFOL;  Surgeon: Lin Landsman, MD;  Location: [facility] ENDOSCOPY;  Service: Gastroenterology;  Laterality: N/A;  . FOOT SURGERY    . TOTAL SHOULDER REPLACEMENT Right 01/25/2015   Dr. Roland Rack  . TOTAL THYROIDECTOMY  08/01/2018    Family History  Problem Relation Age of Onset  . Heart disease  Mother   . Heart disease Father   . Kidney disease Sister   . Hypertension Sister   . Diabetes Sister   . Hypertension Brother   . CVA Brother   . Breast cancer Maternal Aunt 65  . Hypertension Sister   . Kidney failure Sister   . Ovarian cancer Neg Hx   . Colon cancer Neg Hx     Social History   Socioeconomic History  . Marital status: Legally Separated    Spouse name: Not on file  . Number of children: 1  . Years of education: Not on file  . Highest education level: 12th grade  Occupational History  .  Occupation: Cytogeneticist  Tobacco Use  . Smoking status: Never Smoker  . Smokeless tobacco: Never Used  Substance and Sexual Activity  . Alcohol use: No    Alcohol/week: 0.0 standard drinks  . Drug use: No  . Sexual activity: Not Currently    Birth control/protection: Post-menopausal  Other Topics Concern  . Not on file  Social History Narrative   Separated in around 2009.   She was living with her daughter, however currently moved in with her sister and nephew , but is looking for a house.    Social Determinants of Health   Financial Resource Strain: Low Risk   . Difficulty of Paying Living Expenses: Not hard at all  Food Insecurity: No Food Insecurity  . Worried About Charity fundraiser in the Last Year: Never true  . Ran Out of Food in the Last Year: Never true  Transportation Needs: No Transportation Needs  . Lack of Transportation (Medical): No  . Lack of Transportation (Non-Medical): No  Physical Activity: Insufficiently Active  . Days of Exercise per Week: 2 days  . Minutes of Exercise per Session: 60 min  Stress: No Stress Concern Present  . Feeling of Stress : Only a little  Social Connections: Slightly Isolated  . Frequency of Communication with Friends and Family: More than three times a week  . Frequency of Social Gatherings with Friends and Family: More than three times a week  . Attends Religious Services: More than 4 times per year  .  Active Member of Clubs or Organizations: Yes  . Attends Archivist Meetings: 1 to 4 times per year  . Marital Status: Separated  Intimate Partner Violence: Not At Risk  . Fear of Current or Ex-Partner: No  . Emotionally Abused: No  . Physically Abused: No  . Sexually Abused: No     Current Outpatient Medications:  .  aspirin 81 MG tablet, Take by mouth., Disp: , Rfl:  .  atorvastatin (LIPITOR) 20 MG tablet, Take 1 tablet (20 mg total) by mouth daily., Disp: 90 tablet, Rfl: 3 .  Cholecalciferol (VITAMIN D3) 1000 units CAPS, Take 2 capsules by mouth daily., Disp: , Rfl:  .  Dapagliflozin-metFORMIN HCl ER (XIGDUO XR) 08-999 MG TB24, Take 1 tablet by mouth daily., Disp: 90 tablet, Rfl: 1 .  ezetimibe (ZETIA) 10 MG tablet, Take 1 tablet (10 mg total) by mouth daily., Disp: 90 tablet, Rfl: 2 .  glucose blood (FREESTYLE LITE) test strip, FREESTYLE LITE TEST (In Vitro Strip)  use as directed for 0 days  Quantity: 200.00;  Refills: 3   Ordered :25-Nov-2014  Vonna Kotyk ;  Started 30-Dec-2008 Active Comments: DX: 250.00, Disp: , Rfl:  .  ketoconazole (NIZORAL) 2 % cream, Apply 1 application topically daily., Disp: 60 g, Rfl: 0 .  levothyroxine (SYNTHROID, LEVOTHROID) 125 MCG tablet, Take 1 tablet by mouth daily., Disp: , Rfl:  .  Olmesartan-amLODIPine-HCTZ 40-5-25 MG TABS, TAKE 1 TABLET DAILY, Disp: 90 tablet, Rfl: 1 .  diclofenac Sodium (VOLTAREN) 1 % GEL, Apply 2 g topically 4 (four) times daily. (Patient not taking: Reported on 12/29/2019), Disp: 100 g, Rfl: 0 .  Multiple Vitamin (MULTIVITAMIN) tablet, Take 1 tablet by mouth daily., Disp: , Rfl:   No Known Allergies   ROS  Constitutional: Negative for fever or weight change.  Respiratory: Negative for cough and shortness of breath.   Cardiovascular: Negative for chest pain or palpitations.  Gastrointestinal: Negative for abdominal pain, no bowel changes.  Musculoskeletal: Negative for gait problem or joint swelling.  Skin:  Negative for rash.  Neurological: Negative for dizziness or headache.  No other specific complaints in a complete review of systems (except as listed in HPI above).  Objective  Vitals:   12/29/19 1054  BP: 126/78  Pulse: 86  Resp: 18  Temp: (!) 97.2 F (36.2 C)  TempSrc: Temporal  SpO2: 99%  Weight: 222 lb 9.6 oz (101 kg)  Height: 5' 4"  (1.626 m)    Body mass index is 38.21 kg/m.  Physical Exam  Constitutional: Patient appears well-developed and well-nourished. No distress.  HENT: Head: Normocephalic and atraumatic. Ears: B TMs ok, no erythema or effusion; Nose: Not done Mouth/Throat: not done Eyes: Conjunctivae and EOM are normal. Pupils are equal, round, and reactive to light. No scleral icterus.  Neck: Normal range of motion. Neck supple. No JVD present. No thyromegaly present.  Cardiovascular: Normal rate, regular rhythm and normal heart sounds.  3/6 SEM  murmur heard. No BLE edema. Pulmonary/Chest: Effort normal and breath sounds normal. No respiratory distress. Abdominal: Soft. Bowel sounds are normal, no distension. There is no tenderness. no masses Breast: no lumps or masses, no nipple discharge or rashes FEMALE GENITALIA:  Not done RECTAL: not done Musculoskeletal: Normal range of motion, no joint effusions. No gross deformities Neurological: he is alert and oriented to person, place, and time. No cranial nerve deficit. Coordination, balance, strength, speech and gait are normal.  Skin: Skin is warm and dry. No rash noted. No erythema.  Psychiatric: Patient has a normal mood and affect. behavior is normal. Judgment and thought content normal.  Recent Results (from the past 2160 hour(s))  Microalbumin / creatinine urine ratio     Status: Abnormal   Collection Time: 10/26/19 12:00 AM  Result Value Ref Range   Creatinine, Urine 95 20 - 275 mg/dL   Microalb, Ur 3.3 mg/dL    Comment: Reference Range Not established    Microalb Creat Ratio 35 (H) <30 mcg/mg creat     Comment: . The ADA defines abnormalities in albumin excretion as follows: Marland Kitchen Category         Result (mcg/mg creatinine) . Normal                    <30 Microalbuminuria         30-299  Clinical albuminuria   > OR = 300 . The ADA recommends that at least two of three specimens collected within a 3-6 month period be abnormal before considering a patient to be within a diagnostic category.   Lipid panel     Status: Abnormal   Collection Time: 10/26/19 12:00 AM  Result Value Ref Range   Cholesterol 154 <200 mg/dL   HDL 41 (L) > OR = 50 mg/dL   Triglycerides 64 <150 mg/dL   LDL Cholesterol (Calc) 98 mg/dL (calc)    Comment: Reference range: <100 . Desirable range <100 mg/dL for primary prevention;   <70 mg/dL for patients with CHD or diabetic patients  with > or = 2 CHD risk factors. Marland Kitchen LDL-C is now calculated using the Martin-Hopkins  calculation, which is a validated novel method providing  better accuracy than the Friedewald equation in the  estimation of LDL-C.  Cresenciano Genre et al. Annamaria Helling. 1610;960(45): 2061-2068  (http://education.[facility].com/faq/FAQ164)    Total CHOL/HDL Ratio 3.8 <5.0 (calc)   Non-HDL Cholesterol (Calc) 113 <130 mg/dL (calc)    Comment: For patients with diabetes plus 1 major ASCVD risk  factor, treating to a non-HDL-C goal of <100 mg/dL  (LDL-C of <70 mg/dL) is considered a therapeutic  option.   COMPLETE METABOLIC PANEL WITH GFR     Status: Abnormal   Collection Time: 10/26/19 12:00 AM  Result Value Ref Range   Glucose, Bld 104 (H) 65 - 99 mg/dL    Comment: .            Fasting reference interval . For someone without known diabetes, a glucose value between 100 and 125 mg/dL is consistent with prediabetes and should be confirmed with a follow-up test. .    BUN 13 7 - 25 mg/dL   Creat 1.21 (H) 0.50 - 0.99 mg/dL    Comment: For patients >92 years of age, the reference limit for Creatinine is approximately 13% higher for  people identified as African-American. .    GFR, Est Non African American 48 (L) > OR = 60 mL/min/1.59m   GFR, Est African American 56 (L) > OR = 60 mL/min/1.760m  BUN/Creatinine Ratio 11 6 - 22 (calc)   Sodium 144 135 - 146 mmol/L   Potassium 3.9 3.5 - 5.3 mmol/L   Chloride 105 98 - 110 mmol/L   CO2 30 20 - 32 mmol/L   Calcium 9.7 8.6 - 10.4 mg/dL   Total Protein 7.2 6.1 - 8.1 g/dL   Albumin 4.1 3.6 - 5.1 g/dL   Globulin 3.1 1.9 - 3.7 g/dL (calc)   AG Ratio 1.3 1.0 - 2.5 (calc)   Total Bilirubin 0.6 0.2 - 1.2 mg/dL   Alkaline phosphatase (APISO) 38 37 - 153 U/L   AST 21 10 - 35 U/L   ALT 20 6 - 29 U/L  CBC with Differential/Platelet     Status: Abnormal   Collection Time: 10/26/19 12:00 AM  Result Value Ref Range   WBC 4.7 3.8 - 10.8 Thousand/uL   RBC 4.81 3.80 - 5.10 Million/uL   Hemoglobin 13.0 11.7 - 15.5 g/dL   HCT 39.7 35.0 - 45.0 %   MCV 82.5 80.0 - 100.0 fL   MCH 27.0 27.0 - 33.0 pg   MCHC 32.7 32.0 - 36.0 g/dL   RDW 14.7 11.0 - 15.0 %   Platelets 407 (H) 140 - 400 Thousand/uL   MPV 9.5 7.5 - 12.5 fL   Neutro Abs 2,110 1,500 - 7,800 cells/uL   Lymphs Abs 2,063 850 - 3,900 cells/uL   Absolute Monocytes 329 200 - 950 cells/uL   Eosinophils Absolute 160 15 - 500 cells/uL   Basophils Absolute 38 0 - 200 cells/uL   Neutrophils Relative % 44.9 %   Total Lymphocyte 43.9 %   Monocytes Relative 7.0 %   Eosinophils Relative 3.4 %   Basophils Relative 0.8 %  CULTURE, URINE COMPREHENSIVE     Status: Abnormal   Collection Time: 10/26/19 12:00 AM   Specimen: Urine  Result Value Ref Range   MICRO NUMBER: [id number]  SPECIMEN QUALITY: Adequate    Source OTHER (SPECIFY)    STATUS: FINAL    ISOLATE 1: Gram positive cocci isolated (A)     Comment: 1,000-9,000 CFU/ML of Gram positive cocci isolated Gram positive cocci isolated Gram positive cocci isolated May represent colonizers from external and internal genitalia. No further testing (including susceptibility) will be  performed.  Urinalysis, Complete     Status: Abnormal   Collection Time: 10/26/19 12:00 AM  Result Value Ref Range   Color, Urine YELLOW YELLOW   APPearance CLEAR CLEAR  Specific Gravity, Urine 1.025 1.001 - 1.03   pH 6.5 5.0 - 8.0   Glucose, UA 3+ (A) NEGATIVE   Bilirubin Urine NEGATIVE NEGATIVE   Ketones, ur NEGATIVE NEGATIVE   Hgb urine dipstick NEGATIVE NEGATIVE   Protein, ur NEGATIVE NEGATIVE   Nitrite NEGATIVE NEGATIVE   Leukocytes,Ua NEGATIVE NEGATIVE   WBC, UA 6-10 (A) 0 - 5 /HPF   RBC / HPF NONE SEEN 0 - 2 /HPF   Squamous Epithelial / LPF 6-10 (A) < OR = 5 /HPF   Bacteria, UA NONE SEEN NONE SEEN /HPF   Hyaline Cast NONE SEEN NONE SEEN /LPF  POCT HgB A1C     Status: Abnormal   Collection Time: 10/26/19 10:57 AM  Result Value Ref Range   Hemoglobin A1C 7.1 (A) 4.0 - 5.6 %   HbA1c POC (<> result, manual entry)     HbA1c, POC (prediabetic range)     HbA1c, POC (controlled diabetic range)      Diabetic Foot Exam: Diabetic Foot Exam - Simple   Simple Foot Form Diabetic Foot exam was performed with the following findings: Yes 12/29/2019 11:23 AM  Visual Inspection See comments: Yes Sensation Testing Intact to touch and monofilament testing bilaterally: Yes Pulse Check Posterior Tibialis and Dorsalis pulse intact bilaterally: Yes Comments Thick toenails       Fall Risk: Fall Risk  12/29/2019 10/26/2019 03/30/2019 12/29/2018 09/29/2018  Falls in the past year? 0 0 0 0 0  Number falls in past yr: 0 0 0 - -  Injury with Fall? 0 0 0 - -     Functional Status Survey: Is the patient deaf or have difficulty hearing?: No Does the patient have difficulty seeing, even when wearing glasses/contacts?: No Does the patient have difficulty concentrating, remembering, or making decisions?: No Does the patient have difficulty walking or climbing stairs?: No Does the patient have difficulty dressing or bathing?: No Does the patient have difficulty doing errands alone such as  visiting a doctor's office or shopping?: No   Assessment & Plan  1. Well adult exam   2. Need for Tdap vaccination  - Tdap vaccine greater than or equal to 7yo IM  -USPSTF grade A and B recommendations reviewed with patient; age-appropriate recommendations, preventive care, screening tests, etc discussed and encouraged; healthy living encouraged; see AVS for patient education given to patient -Discussed importance of 150 minutes of physical activity weekly, eat two servings of fish weekly, eat one serving of tree nuts ( cashews, pistachios, pecans, almonds.Marland Kitchen) every other day, eat 6 servings of fruit/vegetables daily and drink plenty of water and avoid sweet beverages.

## 2019-12-29 NOTE — Patient Instructions (Signed)

## 2019-12-30 ENCOUNTER — Ambulatory Visit
Admission: RE | Admit: 2019-12-30 | Discharge: 2019-12-30 | Disposition: A | Discharge status: Home / Self Care | Payer: BC Managed Care – PPO | Source: Ambulatory Visit | Attending: Family Medicine | Admitting: Family Medicine

## 2019-12-30 DIAGNOSIS — Z1231 Encounter for screening mammogram for malignant neoplasm of breast: Secondary | ICD-10-CM

## 2020-02-08 ENCOUNTER — Other Ambulatory Visit: Payer: Self-pay | Admitting: Physician Assistant

## 2020-02-08 DIAGNOSIS — I119 Hypertensive heart disease without heart failure: Secondary | ICD-10-CM

## 2020-02-26 ENCOUNTER — Ambulatory Visit: Payer: BC Managed Care – PPO | Admitting: Family Medicine

## 2020-02-26 ENCOUNTER — Other Ambulatory Visit: Payer: Self-pay

## 2020-02-26 ENCOUNTER — Encounter: Payer: Self-pay | Admitting: Family Medicine

## 2020-02-26 VITALS — BP 130/78 | HR 81 | Temp 97.3°F | Resp 16 | Ht 64.0 in | Wt 227.0 lb

## 2020-02-26 DIAGNOSIS — I10 Essential (primary) hypertension: Secondary | ICD-10-CM

## 2020-02-26 DIAGNOSIS — E1122 Type 2 diabetes mellitus with diabetic chronic kidney disease: Secondary | ICD-10-CM

## 2020-02-26 DIAGNOSIS — M25561 Pain in right knee: Secondary | ICD-10-CM

## 2020-02-26 DIAGNOSIS — I152 Hypertension secondary to endocrine disorders: Secondary | ICD-10-CM

## 2020-02-26 DIAGNOSIS — I119 Hypertensive heart disease without heart failure: Secondary | ICD-10-CM

## 2020-02-26 DIAGNOSIS — M25562 Pain in left knee: Secondary | ICD-10-CM

## 2020-02-26 DIAGNOSIS — N183 Chronic kidney disease, stage 3 unspecified: Secondary | ICD-10-CM | POA: Diagnosis not present

## 2020-02-26 DIAGNOSIS — E1159 Type 2 diabetes mellitus with other circulatory complications: Secondary | ICD-10-CM | POA: Diagnosis not present

## 2020-02-26 DIAGNOSIS — G4733 Obstructive sleep apnea (adult) (pediatric): Secondary | ICD-10-CM

## 2020-02-26 DIAGNOSIS — E890 Postprocedural hypothyroidism: Secondary | ICD-10-CM

## 2020-02-26 DIAGNOSIS — E785 Hyperlipidemia, unspecified: Secondary | ICD-10-CM

## 2020-02-26 LAB — POCT GLYCOSYLATED HEMOGLOBIN (HGB A1C): Hemoglobin A1C: 7.1 % — AB (ref 4.0–5.6)

## 2020-02-26 MED ORDER — XIGDUO XR 10-1000 MG PO TB24: 1.0000 | ORAL_TABLET | Freq: Every day | ORAL | 1 refills | Status: DC

## 2020-02-26 MED ORDER — LEVOTHYROXINE SODIUM 125 MCG PO TABS: 125.0000 ug | ORAL_TABLET | Freq: Every day | ORAL | 0 refills | Status: DC

## 2020-02-26 MED ORDER — RYBELSUS 7 MG PO TABS: 1.0000 | ORAL_TABLET | Freq: Every day | ORAL | 0 refills | Status: DC

## 2020-02-26 NOTE — Progress Notes (Addendum)
Name: Andrea Randolph   MRN: [id number]    DOB: 06/15/1956   Date:02/26/2020       Progress Note  Subjective  Chief Complaint  Chief Complaint  Patient presents with  . Hypertension  . Diabetes    HPI  History of COVID-19: she went to Urgent Care on 09/08/2019, initial test was negative, but went back on 09/15/2019 because symptoms were still present with body aches, headache, fever and nausea. Sore throat already resolved. She was given zofran She states indigestion finally resolved. Still has some fatigue   DMII: she states she was on Rybelsus and stopped Synjardi, but than decided to stop Rybelsus because she was not losing weight and is back on Synjardi, she has lost weight again since last visit, insurance requested change to [place] . She states she splurges at times, she has been eating breakfast and lunch but is skipping dinner most day. She avoids sodas, drinking water. Eye exam was scheduled for next week. Discussed COVID -19 vaccine. We will continue Xigduo and add Rybelsus to see if we can keep A1C below 7 %  Surgical hypothyroidism: last visit with Dr. Gabriel Carina was 07/2019 and TSH was 0.593 she is currently taking 125 mcg daily , no change in bowel movements, no hair loss or dry skin , she states she has been released back to me We will recheck next visit   Morbid obesity: shehad lost 6 lbs but she gained it back, she is not sure why, but she states she will resume physical activity   Bilateral knee pain: she states it has been present for the past few months, worse when sitting or at night, no redness or increase in warmth. Pain is described as aching, she states feels stiff when she first gets out of bed. Advised Tylenol 500 to 650 mg at night and topical voltaren gel . Unchanged   Hyperlipidemia: on statin and Zetia, denies myalgias. She also has LVH under the care of cardiologist , denies side effects of medication. Reviewed last labs with her   OSA: she does not tolerate  CPAP machine   Patient Active Problem List   Diagnosis Date Noted  . Thrombocytosis ([place]) 11/01/2019  . Carpal tunnel syndrome, bilateral 02/19/2019  . Trigger finger of right thumb 02/19/2019  . Flexor tenosynovitis of finger 02/19/2019  . Severe obesity (BMI 35.0-35.9 with comorbidity) ([place]) 12/29/2018  . Mild concentric left ventricular hypertrophy (LVH) 12/24/2017  . Mild aortic stenosis by prior echocardiogram 12/24/2017  . Mild aortic regurgitation 12/24/2017  . CKD (chronic kidney disease), stage I 06/22/2016  . Uterine fibroid 01/11/2016  . Allergic rhinitis, seasonal 05/24/2015  . Benign essential HTN 05/24/2015  . Chronic constipation 05/24/2015  . Diabetes mellitus with renal manifestation ([place]) 05/24/2015  . Dyslipidemia 05/24/2015  . Post-surgical hypothyroidism 05/24/2015  . Mixed incontinence 05/24/2015  . Microalbuminuria 05/24/2015  . Menopause 05/24/2015  . Obstructive apnea 05/24/2015  . Morbid obesity ([place]) 05/24/2015  . Hypertensive retinopathy 05/24/2015  . Vitamin D deficiency 05/24/2015  . Status post total replacement of right shoulder 03/11/2015    Past Surgical History:  Procedure Laterality Date  . BREAST BIOPSY Right 90's   benign core needle   . COLONOSCOPY WITH PROPOFOL N/A 01/26/2019   Procedure: COLONOSCOPY WITH PROPOFOL;  Surgeon: Lin Landsman, MD;  Location: [facility] ENDOSCOPY;  Service: Gastroenterology;  Laterality: N/A;  . FOOT SURGERY    . TOTAL SHOULDER REPLACEMENT Right 01/25/2015   Dr. Roland Rack  . TOTAL THYROIDECTOMY  08/01/2018    Family History  Problem Relation Age of Onset  . Heart disease Mother   . Heart disease Father   . Kidney disease Sister   . Hypertension Sister   . Diabetes Sister   . Hypertension Brother   . CVA Brother   . Breast cancer Maternal Aunt 75  . Hypertension Sister   . Kidney failure Sister   . Ovarian cancer Neg Hx   . Colon cancer Neg Hx     Social History   Tobacco Use  . Smoking  status: Never Smoker  . Smokeless tobacco: Never Used  Substance Use Topics  . Alcohol use: No    Alcohol/week: 0.0 standard drinks     Current Outpatient Medications:  .  aspirin 81 MG tablet, Take by mouth., Disp: , Rfl:  .  Cholecalciferol (VITAMIN D3) 1000 units CAPS, Take 2 capsules by mouth daily., Disp: , Rfl:  .  Dapagliflozin-metFORMIN HCl ER (XIGDUO XR) 08-999 MG TB24, Take 1 tablet by mouth daily., Disp: 90 tablet, Rfl: 1 .  diclofenac Sodium (VOLTAREN) 1 % GEL, Apply 2 g topically 4 (four) times daily., Disp: 100 g, Rfl: 0 .  glucose blood (FREESTYLE LITE) test strip, FREESTYLE LITE TEST (In Vitro Strip)  use as directed for 0 days  Quantity: 200.00;  Refills: 3   Ordered :25-Nov-2014  Vonna Kotyk ;  Started 30-Dec-2008 Active Comments: DX: 250.00, Disp: , Rfl:  .  ketoconazole (NIZORAL) 2 % cream, Apply 1 application topically daily., Disp: 60 g, Rfl: 0 .  levothyroxine (SYNTHROID, LEVOTHROID) 125 MCG tablet, Take 1 tablet by mouth daily., Disp: , Rfl:  .  Multiple Vitamin (MULTIVITAMIN) tablet, Take 1 tablet by mouth daily., Disp: , Rfl:  .  Olmesartan-amLODIPine-HCTZ 40-5-25 MG TABS, TAKE 1 TABLET DAILY, Disp: 90 tablet, Rfl: 0 .  atorvastatin (LIPITOR) 20 MG tablet, Take 1 tablet (20 mg total) by mouth daily., Disp: 90 tablet, Rfl: 3 .  ezetimibe (ZETIA) 10 MG tablet, Take 1 tablet (10 mg total) by mouth daily., Disp: 90 tablet, Rfl: 2  No Known Allergies  I personally reviewed active problem list, medication list, allergies, family history, social history, health maintenance with the patient/caregiver today.   ROS  Constitutional: Negative for fever or weight change.  Respiratory: Negative for cough and shortness of breath.   Cardiovascular: Negative for chest pain or palpitations.  Gastrointestinal: Negative for abdominal pain, no bowel changes.  Musculoskeletal: Negative for gait problem or joint swelling.  Skin: Negative for rash.  Neurological: Negative  for dizziness or headache.  No other specific complaints in a complete review of systems (except as listed in HPI above).  Objective  Vitals:   02/26/20 1030  BP: 130/78  Pulse: 81  Resp: 16  Temp: (!) 97.3 F (36.3 C)  TempSrc: Temporal  SpO2: 98%  Weight: 227 lb (103 kg)  Height: 5\' 4"  (1.626 m)    Body mass index is 38.96 kg/m.  Physical Exam  Constitutional: Patient appears well-developed and well-nourished. Obese  No distress.  HEENT: head atraumatic, normocephalic, pupils equal and reactive to light Cardiovascular: Normal rate, regular rhythm and normal heart sounds.  3/6 systolic  murmur heard. Trace BLE edema. Pulmonary/Chest: Effort normal and breath sounds normal. No respiratory distress. Abdominal: Soft.  There is no tenderness. Psychiatric: Patient has a normal mood and affect. behavior is normal. Judgment and thought content normal.   PHQ2/9: Depression screen [facility] 2/9 02/26/2020 12/29/2019 10/26/2019 03/30/2019 12/29/2018  Decreased Interest 0 0  0 0 1  Down, Depressed, Hopeless 0 0 0 0 0  PHQ - 2 Score 0 0 0 0 1  Altered sleeping 0 1 0 1 2  Tired, decreased energy 0 1 0 0 1  Change in appetite 0 1 0 0 3  Feeling bad or failure about yourself  0 0 0 0 0  Trouble concentrating 0 0 0 0 1  Moving slowly or fidgety/restless 0 0 0 0 0  Suicidal thoughts 0 0 0 0 0  PHQ-9 Score 0 3 0 1 8  Difficult doing work/chores - Not difficult at all Not difficult at all Not difficult at all Not difficult at all  Some recent data might be hidden    phq 9 is negative  Fall Risk: Fall Risk  02/26/2020 12/29/2019 10/26/2019 03/30/2019 12/29/2018  Falls in the past year? 0 0 0 0 0  Number falls in past yr: 0 0 0 0 -  Injury with Fall? 0 0 0 0 -    Assessment & Plan   1. Controlled type 2 diabetes mellitus with stage 3 chronic kidney disease, without long-term current use of insulin (HCC)  - POCT HgB A1C - Dapagliflozin-metFORMIN HCl ER (XIGDUO XR) 08-999 MG TB24; Take 1  tablet by mouth daily.  Dispense: 90 tablet; Refill: 1 - Semaglutide (RYBELSUS) 7 MG TABS; Take 1 tablet by mouth daily.  Dispense: 90 tablet; Refill: 0  2. Benign essential HTN  At goal   3. Hypertension associated with diabetes ([place])  Doing well   4. Hypertensive left ventricular hypertrophy, without heart failure  Under the care of cardiologist   5. Pain in both knees, unspecified chronicity  stable  6. Dyslipidemia   7. Obstructive apnea  Cannot tolerate CPAP  8. Post-surgical hypothyroidism  - levothyroxine (SYNTHROID) 125 MCG tablet; Take 1 tablet (125 mcg total) by mouth daily.  Dispense: 90 tablet; Refill: 0  9. Morbid obesity ([place])  Discussed with the patient the risk posed by an increased BMI. Discussed importance of portion control, calorie counting and at least 150 minutes of physical activity weekly. Avoid sweet beverages and drink more water. Eat at least 6 servings of fruit and vegetables daily

## 2020-03-04 ENCOUNTER — Other Ambulatory Visit: Payer: Self-pay | Admitting: Physician Assistant

## 2020-03-04 DIAGNOSIS — H5213 Myopia, bilateral: Secondary | ICD-10-CM | POA: Diagnosis not present

## 2020-03-04 LAB — HM DIABETES EYE EXAM

## 2020-04-17 ENCOUNTER — Other Ambulatory Visit: Payer: Self-pay | Admitting: Physician Assistant

## 2020-04-17 DIAGNOSIS — I119 Hypertensive heart disease without heart failure: Secondary | ICD-10-CM

## 2020-05-23 ENCOUNTER — Other Ambulatory Visit: Payer: Self-pay | Admitting: Physician Assistant

## 2020-06-20 ENCOUNTER — Ambulatory Visit: Payer: BC Managed Care – PPO | Admitting: Family Medicine

## 2020-06-20 ENCOUNTER — Encounter: Payer: Self-pay | Admitting: Family Medicine

## 2020-06-20 ENCOUNTER — Other Ambulatory Visit: Payer: Self-pay

## 2020-06-20 VITALS — BP 132/80 | HR 72 | Temp 98.2°F | Resp 16 | Ht 64.0 in | Wt 226.1 lb

## 2020-06-20 DIAGNOSIS — N181 Chronic kidney disease, stage 1: Secondary | ICD-10-CM

## 2020-06-20 DIAGNOSIS — E1122 Type 2 diabetes mellitus with diabetic chronic kidney disease: Secondary | ICD-10-CM

## 2020-06-20 DIAGNOSIS — M542 Cervicalgia: Secondary | ICD-10-CM

## 2020-06-20 DIAGNOSIS — I10 Essential (primary) hypertension: Secondary | ICD-10-CM | POA: Diagnosis not present

## 2020-06-20 DIAGNOSIS — E559 Vitamin D deficiency, unspecified: Secondary | ICD-10-CM | POA: Diagnosis not present

## 2020-06-20 DIAGNOSIS — E890 Postprocedural hypothyroidism: Secondary | ICD-10-CM

## 2020-06-20 DIAGNOSIS — I350 Nonrheumatic aortic (valve) stenosis: Secondary | ICD-10-CM

## 2020-06-20 DIAGNOSIS — N183 Chronic kidney disease, stage 3 unspecified: Secondary | ICD-10-CM | POA: Diagnosis not present

## 2020-06-20 DIAGNOSIS — D473 Essential (hemorrhagic) thrombocythemia: Secondary | ICD-10-CM

## 2020-06-20 DIAGNOSIS — G8929 Other chronic pain: Secondary | ICD-10-CM

## 2020-06-20 DIAGNOSIS — E785 Hyperlipidemia, unspecified: Secondary | ICD-10-CM | POA: Diagnosis not present

## 2020-06-20 DIAGNOSIS — G4733 Obstructive sleep apnea (adult) (pediatric): Secondary | ICD-10-CM

## 2020-06-20 DIAGNOSIS — M25561 Pain in right knee: Secondary | ICD-10-CM

## 2020-06-20 DIAGNOSIS — E1159 Type 2 diabetes mellitus with other circulatory complications: Secondary | ICD-10-CM

## 2020-06-20 DIAGNOSIS — D75839 Thrombocytosis, unspecified: Secondary | ICD-10-CM

## 2020-06-20 DIAGNOSIS — H35039 Hypertensive retinopathy, unspecified eye: Secondary | ICD-10-CM

## 2020-06-20 DIAGNOSIS — I152 Hypertension secondary to endocrine disorders: Secondary | ICD-10-CM

## 2020-06-20 LAB — POCT GLYCOSYLATED HEMOGLOBIN (HGB A1C): Hemoglobin A1C: 6.4 % — AB (ref 4.0–5.6)

## 2020-06-20 MED ORDER — XIGDUO XR 10-1000 MG PO TB24: 1.0000 | ORAL_TABLET | Freq: Every day | ORAL | 1 refills | Status: DC

## 2020-06-20 MED ORDER — RYBELSUS 7 MG PO TABS: 1.0000 | ORAL_TABLET | Freq: Every day | ORAL | 1 refills | Status: DC

## 2020-06-20 MED ORDER — METAXALONE 800 MG PO TABS: 800.0000 mg | ORAL_TABLET | Freq: Three times a day (TID) | ORAL | 0 refills | Status: DC

## 2020-06-20 NOTE — Progress Notes (Signed)
Name: Andrea Randolph   MRN: [id number]    DOB: 06/06/1956   Date:06/20/2020       Progress Note  Subjective  Chief Complaint  Chief Complaint  Patient presents with  . Hypertension  . Diabetes  . Dyslipidemia  . Medication Management    She has concerns about the Rybelsus that she has been taking.    HPI  History of COVID-19: she went to Urgent Care on 09/08/2019 , symptoms now completely resolved   DMII: she is currently taking Xigduo and Rybelsus, side effect os mild indigestion, no change in bowel movements. She is having tremors about 2 hours after taking her medications in the morning. She eats breakfast but it is a light meal. A1C today is 6.4%, since tremors are not daily, we will try more protein and fat for breakfast She denies polyphagia, polydipsia or polyuria. Eye exam is up to date on ARB and statin therapy   Surgical hypothyroidism:last visit withDr. Wendall Mola 07/2019 and TSH was 0.593 she is currently taking 125 mcg daily, no change in bowel movements, no hair loss or dry skin, she states she has been released back to me We will recheck  TSH today.   Morbid obesity: weight is stable at this time, she is avoiding bread, also trying to cut down on sweets   Bilateral knee pain: she states it has been present for the past 6 months , worse when sitting or at night, no redness or increase in warmth. Pain is described as aching, she states feels stiff when she first gets out of bed. We had discussed Tylenol, she is now wearing a brace, she has tried home exercises. 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 Discussed evaluation for OA, she is not interested at this time  Hyperlipidemia: on statin and Zetia, denies myalgias. She also has LVH under the care of cardiologist , denies side effects of medication. recheck labs   OSA: she does not tolerate CPAP machine. Unchanged   Thrombocytosis: we will recheck CBC today   Right neck pain: she states pain  starts on nuchal area, affecting rom of neck, does not radiate to shoulders or arms, mostly on the right side, no injury . We will start muscle relaxer.   Patient Active Problem List   Diagnosis Date Noted  . Thrombocytosis ([place]) 11/01/2019  . Carpal tunnel syndrome, bilateral 02/19/2019  . Trigger finger of right thumb 02/19/2019  . Flexor tenosynovitis of finger 02/19/2019  . Severe obesity (BMI 35.0-35.9 with comorbidity) ([place]) 12/29/2018  . Mild concentric left ventricular hypertrophy (LVH) 12/24/2017  . Mild aortic stenosis by prior echocardiogram 12/24/2017  . Mild aortic regurgitation 12/24/2017  . CKD (chronic kidney disease), stage I 06/22/2016  . Uterine fibroid 01/11/2016  . Allergic rhinitis, seasonal 05/24/2015  . Benign essential HTN 05/24/2015  . Chronic constipation 05/24/2015  . Diabetes mellitus with renal manifestation ([place]) 05/24/2015  . Dyslipidemia 05/24/2015  . Post-surgical hypothyroidism 05/24/2015  . Mixed incontinence 05/24/2015  . Microalbuminuria 05/24/2015  . Menopause 05/24/2015  . Obstructive apnea 05/24/2015  . Morbid obesity ([place]) 05/24/2015  . Hypertensive retinopathy 05/24/2015  . Vitamin D deficiency 05/24/2015  . Status post total replacement of right shoulder 03/11/2015    Past Surgical History:  Procedure Laterality Date  . BREAST BIOPSY Right 90's   benign core needle   . COLONOSCOPY WITH PROPOFOL N/A 01/26/2019   Procedure: COLONOSCOPY WITH PROPOFOL;  Surgeon: Lin Landsman, MD;  Location: [facility];  Service: Gastroenterology;  Laterality: N/A;  . FOOT SURGERY    . TOTAL SHOULDER REPLACEMENT Right 01/25/2015   Dr. Roland Rack  . TOTAL THYROIDECTOMY  08/01/2018    Family History  Problem Relation Age of Onset  . Heart disease Mother   . Heart disease Father   . Kidney disease Sister   . Hypertension Sister   . Diabetes Sister   . Hypertension Brother   . CVA Brother   . Breast cancer Maternal Aunt 65  . Hypertension  Sister   . Kidney failure Sister   . Ovarian cancer Neg Hx   . Colon cancer Neg Hx     Social History   Tobacco Use  . Smoking status: Never Smoker  . Smokeless tobacco: Never Used  Substance Use Topics  . Alcohol use: No    Alcohol/week: 0.0 standard drinks     Current Outpatient Medications:  .  aspirin 81 MG tablet, Take by mouth., Disp: , Rfl:  .  Cholecalciferol (VITAMIN D3) 1000 units CAPS, Take 2 capsules by mouth daily., Disp: , Rfl:  .  Dapagliflozin-metFORMIN HCl ER (XIGDUO XR) 08-999 MG TB24, Take 1 tablet by mouth daily., Disp: 90 tablet, Rfl: 1 .  diclofenac Sodium (VOLTAREN) 1 % GEL, Apply 2 g topically 4 (four) times daily., Disp: 100 g, Rfl: 0 .  ezetimibe (ZETIA) 10 MG tablet, TAKE 1 TABLET DAILY, Disp: 90 tablet, Rfl: 0 .  glucose blood (FREESTYLE LITE) test strip, FREESTYLE LITE TEST (In Vitro Strip)  use as directed for 0 days  Quantity: 200.00;  Refills: 3   Ordered :25-Nov-2014  Vonna Kotyk ;  Started 30-Dec-2008 Active Comments: DX: 250.00, Disp: , Rfl:  .  ketoconazole (NIZORAL) 2 % cream, Apply 1 application topically daily., Disp: 60 g, Rfl: 0 .  levothyroxine (SYNTHROID) 125 MCG tablet, Take 1 tablet (125 mcg total) by mouth daily., Disp: 90 tablet, Rfl: 0 .  Multiple Vitamin (MULTIVITAMIN) tablet, Take 1 tablet by mouth daily., Disp: , Rfl:  .  Olmesartan-amLODIPine-HCTZ 40-5-25 MG TABS, TAKE 1 TABLET DAILY, Disp: 90 tablet, Rfl: 0 .  Semaglutide (RYBELSUS) 7 MG TABS, Take 1 tablet by mouth daily., Disp: 90 tablet, Rfl: 1 .  atorvastatin (LIPITOR) 20 MG tablet, Take 1 tablet (20 mg total) by mouth daily., Disp: 90 tablet, Rfl: 3 .  metaxalone (SKELAXIN) 800 MG tablet, Take 1 tablet (800 mg total) by mouth 3 (three) times daily., Disp: 90 tablet, Rfl: 0  No Known Allergies  I personally reviewed active problem list, medication list, allergies, family history, social history, health maintenance with the patient/caregiver  today.   ROS  Constitutional: Negative for fever or weight change.  Respiratory: Negative for cough and shortness of breath.   Cardiovascular: Negative for chest pain or palpitations.  Gastrointestinal: Negative for abdominal pain, no bowel changes.  Musculoskeletal: positive  for gait problem but no joint swelling.  Skin: Negative for rash.  Neurological: Negative for dizziness or headache.  No other specific complaints in a complete review of systems (except as listed in HPI above).  Objective  Vitals:   06/20/20 0854  BP: 132/80  Pulse: 72  Resp: 16  Temp: 98.2 F (36.8 C)  TempSrc: Oral  SpO2: 99%  Weight: 226 lb 1.6 oz (102.6 kg)  Height: 5\' 4"  (1.626 m)    Body mass index is 38.81 kg/m.  Physical Exam  Constitutional: Patient appears well-developed and well-nourished. Obese  No distress.  HEENT: head atraumatic, normocephalic, pupils equal and reactive  to light,, neck supple Cardiovascular: Normal rate, regular rhythm and normal heart sounds.  2/6  murmur heard. No BLE edema. Pulmonary/Chest: Effort normal and breath sounds normal. No respiratory distress. Abdominal: Soft.  There is no tenderness. Muscular Skeletal normal grip, decrease rom of neck, pain during palpation of right nuchal area, brace on right knee Psychiatric: Patient has a normal mood and affect. behavior is normal. Judgment and thought content normal.  Recent Results (from the past 2160 hour(s))  POCT HgB A1C     Status: Abnormal   Collection Time: 06/20/20  9:06 AM  Result Value Ref Range   Hemoglobin A1C 6.4 (A) 4.0 - 5.6 %   HbA1c POC (<> result, manual entry)     HbA1c, POC (prediabetic range)     HbA1c, POC (controlled diabetic range)      Diabetic Foot Exam: Diabetic Foot Exam - Simple   Simple Foot Form Diabetic Foot exam was performed with the following findings: Yes 06/20/2020  9:38 AM  Visual Inspection See comments: Yes Sensation Testing Intact to touch and monofilament  testing bilaterally: Yes Pulse Check Posterior Tibialis and Dorsalis pulse intact bilaterally: Yes Comments Dry feet, but otherwise normal       PHQ2/9: Depression screen [facility] 2/9 06/20/2020 02/26/2020 12/29/2019 10/26/2019 03/30/2019  Decreased Interest 0 0 0 0 0  Down, Depressed, Hopeless 0 0 0 0 0  PHQ - 2 Score 0 0 0 0 0  Altered sleeping 0 0 1 0 1  Tired, decreased energy 0 0 1 0 0  Change in appetite 0 0 1 0 0  Feeling bad or failure about yourself  0 0 0 0 0  Trouble concentrating 0 0 0 0 0  Moving slowly or fidgety/restless 0 0 0 0 0  Suicidal thoughts 0 0 0 0 0  PHQ-9 Score 0 0 3 0 1  Difficult doing work/chores - - Not difficult at all Not difficult at all Not difficult at all  Some recent data might be hidden    phq 9 is negative  Fall Risk: Fall Risk  06/20/2020 02/26/2020 12/29/2019 10/26/2019 03/30/2019  Falls in the past year? 0 0 0 0 0  Number falls in past yr: 0 0 0 0 0  Injury with Fall? 0 0 0 0 0     Functional Status Survey: Is the patient deaf or have difficulty hearing?: No Does the patient have difficulty seeing, even when wearing glasses/contacts?: No Does the patient have difficulty concentrating, remembering, or making decisions?: No Does the patient have difficulty walking or climbing stairs?: No Does the patient have difficulty dressing or bathing?: No Does the patient have difficulty doing errands alone such as visiting a doctor's office or shopping?: No   Assessment & Plan  1. Controlled type 2 diabetes mellitus with stage 3 chronic kidney disease, without long-term current use of insulin (HCC)  - POCT HgB A1C - Dapagliflozin-metFORMIN HCl ER (XIGDUO XR) 08-999 MG TB24; Take 1 tablet by mouth daily.  Dispense: 90 tablet; Refill: 1 - Semaglutide (RYBELSUS) 7 MG TABS; Take 1 tablet by mouth daily.  Dispense: 90 tablet; Refill: 1 - Lipid panel - Microalbumin / creatinine urine ratio  2. Post-surgical hypothyroidism  - TSH  3. Benign essential  HTN  - COMPLETE METABOLIC PANEL WITH GFR  4. Obstructive apnea   5. Dyslipidemia  - Lipid panel  6. Hypertension associated with diabetes ([place])   7. Mild aortic stenosis by prior echocardiogram   8. Hypertensive retinopathy, unspecified  laterality   9. CKD (chronic kidney disease), stage I  - COMPLETE METABOLIC PANEL WITH GFR  10. Neck pain  She would like to go back to [facility]   11. Thrombocytosis (HCC)  - CBC with Differential/Platelet  12. Chronic pain of right knee  Seen at [facility] and would like to go back   13. Vitamin D deficiency  - Vitamin D (25 hydroxy)

## 2020-06-20 NOTE — Patient Instructions (Signed)
Tumeric supplement to help with knee pain

## 2020-06-21 LAB — COMPLETE METABOLIC PANEL WITH GFR
AG Ratio: 1.6 (calc) (ref 1.0–2.5)
ALT: 18 U/L (ref 6–29)
AST: 18 U/L (ref 10–35)
Albumin: 4.2 g/dL (ref 3.6–5.1)
Alkaline phosphatase (APISO): 48 U/L (ref 37–153)
BUN/Creatinine Ratio: 14 (calc) (ref 6–22)
BUN: 16 mg/dL (ref 7–25)
CO2: 28 mmol/L (ref 20–32)
Calcium: 9.3 mg/dL (ref 8.6–10.4)
Chloride: 103 mmol/L (ref 98–110)
Creat: 1.13 mg/dL — ABNORMAL HIGH (ref 0.50–0.99)
GFR, Est African American: 60 mL/min/{1.73_m2} (ref >=60)
GFR, Est Non African American: 52 mL/min/{1.73_m2} — ABNORMAL LOW (ref >=60)
Globulin: 2.6 g/dL (calc) (ref 1.9–3.7)
Glucose, Bld: 89 mg/dL (ref 65–99)
Potassium: 3.7 mmol/L (ref 3.5–5.3)
Sodium: 140 mmol/L (ref 135–146)
Total Bilirubin: 0.4 mg/dL (ref 0.2–1.2)
Total Protein: 6.8 g/dL (ref 6.1–8.1)

## 2020-06-21 LAB — CBC WITH DIFFERENTIAL/PLATELET
Absolute Monocytes: 270 cells/uL (ref 200–950)
Basophils Absolute: 32 cells/uL (ref 0–200)
Basophils Relative: 0.7 %
Eosinophils Absolute: 90 cells/uL (ref 15–500)
Eosinophils Relative: 2 %
HCT: 40.7 % (ref 35.0–45.0)
Hemoglobin: 13.2 g/dL (ref 11.7–15.5)
Lymphs Abs: 1868 cells/uL (ref 850–3900)
MCH: 27.5 pg (ref 27.0–33.0)
MCHC: 32.4 g/dL (ref 32.0–36.0)
MCV: 84.8 fL (ref 80.0–100.0)
MPV: 9.9 fL (ref 7.5–12.5)
Monocytes Relative: 6 %
Neutro Abs: 2241 cells/uL (ref 1500–7800)
Neutrophils Relative %: 49.8 %
Platelets: 324 10*3/uL (ref 140–400)
RBC: 4.8 10*6/uL (ref 3.80–5.10)
RDW: 14.2 % (ref 11.0–15.0)
Total Lymphocyte: 41.5 %
WBC: 4.5 10*3/uL (ref 3.8–10.8)

## 2020-06-21 LAB — LIPID PANEL
Cholesterol: 168 mg/dL (ref <200)
HDL: 47 mg/dL — ABNORMAL LOW (ref >=50)
LDL Cholesterol (Calc): 104 mg/dL (calc) — ABNORMAL HIGH
Non-HDL Cholesterol (Calc): 121 mg/dL (calc) (ref <130)
Total CHOL/HDL Ratio: 3.6 (calc) (ref <5.0)
Triglycerides: 83 mg/dL (ref <150)

## 2020-06-21 LAB — MICROALBUMIN / CREATININE URINE RATIO
Creatinine, Urine: 115 mg/dL (ref 20–275)
Microalb Creat Ratio: 15 mcg/mg creat (ref <30)
Microalb, Ur: 1.7 mg/dL

## 2020-06-21 LAB — TSH: TSH: 1.06 mIU/L (ref 0.40–4.50)

## 2020-06-21 LAB — VITAMIN D 25 HYDROXY (VIT D DEFICIENCY, FRACTURES): Vit D, 25-Hydroxy: 43 ng/mL (ref 30–100)

## 2020-06-27 ENCOUNTER — Encounter: Payer: Self-pay | Admitting: Physician Assistant

## 2020-06-27 ENCOUNTER — Other Ambulatory Visit: Payer: Self-pay

## 2020-06-27 ENCOUNTER — Ambulatory Visit: Payer: BC Managed Care – PPO | Admitting: Family

## 2020-06-27 VITALS — BP 130/78 | HR 71 | Ht 64.0 in | Wt 228.4 lb

## 2020-06-27 DIAGNOSIS — Z6839 Body mass index (BMI) 39.0-39.9, adult: Secondary | ICD-10-CM

## 2020-06-27 DIAGNOSIS — I10 Essential (primary) hypertension: Secondary | ICD-10-CM

## 2020-06-27 DIAGNOSIS — E782 Mixed hyperlipidemia: Secondary | ICD-10-CM

## 2020-06-27 NOTE — Patient Instructions (Addendum)
Medication Instructions:  Recommend trying a 2-week holiday from your Atorvastatin.   *If you need a refill on your cardiac medications before your next appointment, please call your pharmacy*  Lab Work: No lab work today.   Testing/Procedures: Your EKG today shows normal sinus rhythm.    Follow-Up: At [facility], you and your health needs are our priority.  As part of our continuing mission to provide you with exceptional heart care, we have created designated Provider Care Teams.  These Care Teams include your primary Cardiologist (physician) and Advanced Practice Providers (APPs -  Physician Assistants and Nurse Practitioners) who all work together to provide you with the care you need, when you need it.  We recommend signing up for the patient portal called "MyChart".  Sign up information is provided on this After Visit Summary.  MyChart is used to connect with patients for Virtual Visits (Telemedicine).  Patients are able to view lab/test results, encounter notes, upcoming appointments, etc.  Non-urgent messages can be sent to your provider as well.   To learn more about what you can do with MyChart, go to [email or web address].    Your next appointment:   12 month(s)  The format for your next appointment:   In Person  Provider:   You may see Ida Rogue, MD or one of the following Advanced Practice Providers on your designated Care Team:    Murray Hodgkins, NP  Christell Faith, PA-C  Marrianne Mood, PA-C  Laurann Montana, NP  Other Instructions  Bempedoic acid; Ezetimibe Tablets or Nexlizet What is this medicine? BEMPEDOIC ACID; EZETIMIBE (BEM pe DOE ik AS id; ez ET i mibe) is used to lower the level of cholesterol in the blood. It is used with other cholesterol-lowering drugs. This medicine may be used for other purposes; ask your health care provider or pharmacist if you have questions. COMMON BRAND NAME(S): NEXLIZET What should I tell my health care provider  before I take this medicine? They need to know if you have any of these conditions:  gout  kidney problems  liver problems  tendon problems  an unusual or allergic reaction to bempedoic acid, ezetimibe, other medicines, foods, dyes, or preservatives  pregnant or trying to become pregnant  breast-feeding How should I use this medicine? Take this medicine by mouth with a glass of water. Follow the directions on the prescription label. Do not cut, crush, or chew this medicine. Swallow the tablets whole. You can take it with or without food. If it upsets your stomach, take it with food. Take your doses at regular intervals. Do not take your medicine more often than directed. Talk to your pediatrician about the use of this medicine in children. Special care may be needed. Overdosage: If you think you have taken too much of this medicine contact a poison control center or emergency room at once. NOTE: This medicine is only for you. Do not share this medicine with others. What if I miss a dose? If you miss a dose, take it as soon as you can. If it is almost time for your next dose, take only that dose. Do not take double or extra doses. What may interact with this medicine? Do not take this medicine with any of the following medications:  fenofibrate  gemfibrozil This medicine may also interact with the following medications:  antacids  cyclosporine  pravastatin  simvastatin  other medicines to lower cholesterol or triglycerides This list may not describe all possible interactions. Give your health care  provider a list of all the medicines, herbs, non-prescription drugs, or dietary supplements you use. Also tell them if you smoke, drink alcohol, or use illegal drugs. Some items may interact with your medicine. What should I watch for while using this medicine? Visit your health care professional for regular checks on your progress. Tell your health care professional if your  symptoms do not start to get better or if they get worse. You may need blood work done while you are taking this medicine. This drug is only part of a total heart-health program. Your doctor or a dietician can suggest a low-cholesterol and low-fat diet to help. Avoid alcohol and smoking, and keep a proper exercise schedule. What side effects may I notice from receiving this medicine? Side effects that you should report to your doctor or health care professional as soon as possible:  allergic reactions like skin rash, itching or hives, swelling of the face, lips, or tongue  signs of gout such as swollen, red, warm, or tender joints, especially in the toes  signs of tendon problems such as tendon pain or swelling or if you are unable to move a joint Side effects that usually do not require medical attention (report these to your doctor or health care professional if they continue or are bothersome):  back pain  cold or flu-like symptoms  headache  muscle spasms  stomach upset or pain This list may not describe all possible side effects. Call your doctor for medical advice about side effects. You may report side effects to FDA at 1-800-FDA-1088. Where should I keep my medicine? Keep out of the reach of children. Store at room temperature between 15 and 30 degrees C (59 and 86 degrees F). Keep this medicine in the original container. Do not throw out the packet in the container. It keeps the medicine dry. Throw away any unused medication after the expiration date. NOTE: This sheet is a summary. It may not cover all possible information. If you have questions about this medicine, talk to your doctor, pharmacist, or health care provider.  2020 Elsevier/Gold Standard (2019-01-14 13:09:47)

## 2020-06-27 NOTE — Progress Notes (Signed)
Office Visit    Patient Name: Andrea Randolph Date of Encounter: 06/27/2020  Primary Care Provider:  Steele Sizer, MD Primary Cardiologist:  Ida Rogue, MD Electrophysiologist:  None   Chief Complaint    Andrea Randolph is a 64 y.o. female with a hx of HTN, hypothyroidism, OSA, HLD, DM2, obesity, mild AI by echo 02/2018, mild LVH by echo 02/2018 presents today for follow up of HTN, HLD.   Past Medical History    Past Medical History:  Diagnosis Date  . Allergy   . Anemia   . Arthritis of right shoulder region    Dr. Tamala Julian  . Chronic insomnia   . Diabetes mellitus without complication ([place])   . Goiter    Dr. Gabriel Carina  . Hyperlipidemia   . Hypertension   . Hypertensive retinopathy of left eye   . Hypothyroidism, adult    Dr. Gabriel Carina  . Inflammation of urethra   . Menopause   . Microalbuminuria    DM  . Mixed incontinence   . Muscle cramps   . Obesity   . OSA (obstructive sleep apnea)   . Radiculitis, lumbosacral   . Vitamin D deficiency    Past Surgical History:  Procedure Laterality Date  . BREAST BIOPSY Right 90's   benign core needle   . COLONOSCOPY WITH PROPOFOL N/A 01/26/2019   Procedure: COLONOSCOPY WITH PROPOFOL;  Surgeon: Lin Landsman, MD;  Location: [facility] ENDOSCOPY;  Service: Gastroenterology;  Laterality: N/A;  . FOOT SURGERY    . TOTAL SHOULDER REPLACEMENT Right 01/25/2015   Dr. Roland Rack  . TOTAL THYROIDECTOMY  08/01/2018    Allergies  No Known Allergies  History of Present Illness    Andrea Randolph is a 64 y.o. female with a hx of HTN, hypothyroidism, OSA, HLD, DM2, obesity, mild AI by echo 02/2018, mild LVH by echo 02/2018  last seen 06/23/19 by Dr. Rockey Situ.  Previous cardiac workup includes echo 2012 with EF 55-60%, mild AS/AI. Repeat echocardiogram 02/2018 with LVEF 60-65%, mild LVH, normal diastolic parameters, no RWMA, mild aortic regurgitation without aortic stenosis.   She has had difficulty with myalgias while taking statins.   When last  seen in clinic 06/2019, she had nonspecific T wave abnormality in the anterior lateral leads unchanged from previous and thought likely due to LVH.  She denied any concerning symptoms for angina.  Very pleasant lady who reports for follow up. Shares with me that she had COVID in November which did not require hospitalization with symptoms including chills, brain fog. Feels she is back to her baseline.   Reports pain in her knee and neck for which she has an upcoming appointment with orthopedics. She has reduced her Atorvastatin to alternating 10mg  and 20mg  every other day without much change in her knee pain. She was previously on Rosuvastatin, but did not tolerate due to myalgias.   She stays active working out. Enjoys going to the gym and walking. She takes spin classes at her gym. Reports some dyspnea on exertion, but only with more than usual activity.   Reports no chest pain, pressure, or tightness. No edema, orthopnea, PND. Reports no palpitations.   EKGs/Labs/Other Studies Reviewed:   The following studies were reviewed today:  EKG:  EKG is ordered today.  The ekg ordered today demonstrates SR 71 bpm with nonspecific T wave abnormality in anterior leads - likely due to LVH per primary cardiologist - no acute ST/T wave changes.   Recent Labs: 06/20/2020: ALT  18; BUN 16; Creat 1.13; Hemoglobin 13.2; Platelets 324; Potassium 3.7; Sodium 140; TSH 1.06  Recent Lipid Panel    Component Value Date/Time   CHOL 168 06/20/2020 0941   CHOL 174 12/21/2015 0856   TRIG 83 06/20/2020 0941   HDL 47 (L) 06/20/2020 0941   HDL 60 12/21/2015 0856   CHOLHDL 3.6 06/20/2020 0941   VLDL 9 12/28/2016 1209   LDLCALC 104 (H) 06/20/2020 0941    Home Medications   Current Meds  Medication Sig  . aspirin 81 MG tablet Take by mouth.  . Cholecalciferol (VITAMIN D3) 1000 units CAPS Take 2 capsules by mouth daily.  . Dapagliflozin-metFORMIN HCl ER (XIGDUO XR) 08-999 MG TB24 Take 1 tablet by mouth daily.  .  diclofenac Sodium (VOLTAREN) 1 % GEL Apply 2 g topically 4 (four) times daily.  Marland Kitchen ezetimibe (ZETIA) 10 MG tablet TAKE 1 TABLET DAILY  . glucose blood (FREESTYLE LITE) test strip FREESTYLE LITE TEST (In Vitro Strip)  use as directed for 0 days  Quantity: 200.00;  Refills: 3   Ordered :25-Nov-2014  Vonna Kotyk ;  Started 30-Dec-2008 Active Comments: DX: 250.00  . ketoconazole (NIZORAL) 2 % cream Apply 1 application topically daily.  Marland Kitchen levothyroxine (SYNTHROID) 125 MCG tablet Take 1 tablet (125 mcg total) by mouth daily.  . metaxalone (SKELAXIN) 800 MG tablet Take 1 tablet (800 mg total) by mouth 3 (three) times daily.  . Multiple Vitamin (MULTIVITAMIN) tablet Take 1 tablet by mouth daily.  . Olmesartan-amLODIPine-HCTZ 40-5-25 MG TABS TAKE 1 TABLET DAILY  . Semaglutide (RYBELSUS) 7 MG TABS Take 1 tablet by mouth daily.  . [DISCONTINUED] atorvastatin (LIPITOR) 20 MG tablet Take 1 tablet (20 mg total) by mouth daily. (Patient taking differently: Take 20 mg by mouth every other day. )      Review of Systems   Review of Systems  Constitutional: Negative for chills, fever and malaise/fatigue.  Cardiovascular: Negative for chest pain, dyspnea on exertion, leg swelling, near-syncope, orthopnea, palpitations and syncope.  Respiratory: Negative for cough, shortness of breath and wheezing.   Musculoskeletal: Positive for myalgias and neck pain.  Gastrointestinal: Negative for nausea and vomiting.  Neurological: Negative for dizziness, light-headedness and weakness.   All other systems reviewed and are otherwise negative except as noted above.  Physical Exam    VS:  BP 130/78   Pulse 71   Ht 5\' 4"  (1.626 m)   Wt 228 lb 6.4 oz (103.6 kg)   SpO2 98%   BMI 39.20 kg/m  , BMI Body mass index is 39.2 kg/m. GEN: Well nourished, overweight,  well developed, in no acute distress. HEENT: normal. Neck: Supple, no JVD, carotid bruits, or masses. Cardiac: RRR, no rubs, or gallops. AX6/5  systolic murmur. No clubbing, cyanosis, edema.  Radials/DP/PT 2+ and equal bilaterally.  Respiratory:  Respirations regular and unlabored, clear to auscultation bilaterally. GI: Soft, nontender, nondistended, BS + x 4. MS: No deformity or atrophy. Skin: Warm and dry, no rash. Neuro:  Strength and sensation are intact. Psych: Normal affect.  Assessment & Plan    1. HTN - BP well controlled. Continue current antihypertensive regimen.  2. HLD - 06/20/20 LDL 104.LDL goal <100, ideally <70. Reports some myalgias versus orthopedic issues. We have agreed to 2 week holiday of statin for re-evaluation of myalgias. She will continue Zetia 10mg  daily. If myalgias resolve, plan to transition to Nexlizet. If myalgias do not resolve, transition to Nexlizet vs trial of Pravastatin.  3. Murmur - Stable 2/6  SEM on exam. No new or worsening lightheadedness, chest pain, shortness of breath. Known mild AI and mild LVH by echo. No indication for repeat echo at this time. 4. DM2 - Recent A1c of 6.4 - continue to follow with PCP.  5. Obesity - Continued weight loss via diet and exercise. Recently started on Semaglutide by her PCP. Encouraged to continue her workouts at the gym.   Disposition: Follow up in 1 year(s) with Dr. Rockey Situ or APP   Loel Dubonnet, NP 06/27/2020, 4:19 PM

## 2020-07-12 ENCOUNTER — Encounter: Payer: Self-pay | Admitting: Family

## 2020-07-12 DIAGNOSIS — E782 Mixed hyperlipidemia: Secondary | ICD-10-CM

## 2020-07-20 MED ORDER — NEXLIZET 180-10 MG PO TABS: 1.0000 | ORAL_TABLET | Freq: Every day | ORAL | 2 refills | Status: DC

## 2020-07-21 DIAGNOSIS — M5030 Other cervical disc degeneration, unspecified cervical region: Secondary | ICD-10-CM | POA: Diagnosis not present

## 2020-07-21 DIAGNOSIS — M5136 Other intervertebral disc degeneration, lumbar region: Secondary | ICD-10-CM | POA: Diagnosis not present

## 2020-07-26 ENCOUNTER — Telehealth: Payer: Self-pay

## 2020-07-26 NOTE — Telephone Encounter (Signed)
Prior Authorization completed and sent to plan in [email or web address] for nexlizet. KEYGillian Shields  PA Case ID: [id number]  Response: [employer] is reviewing your PA request and will respond within 24 hours for Medicaid or up to 72 hours for non-Medicaid plans, based on the required timeframe determined by state or federal regulations. To check for an update later, open this request from your dashboard.

## 2020-07-29 DIAGNOSIS — M48062 Spinal stenosis, lumbar region with neurogenic claudication: Secondary | ICD-10-CM | POA: Diagnosis not present

## 2020-08-04 ENCOUNTER — Telehealth: Payer: Self-pay | Admitting: Family

## 2020-08-04 NOTE — Telephone Encounter (Signed)
Appeal for [place] and additional information requested regarding prior authorization faxed to [employer].   Loel Dubonnet, NP

## 2020-08-05 DIAGNOSIS — M48062 Spinal stenosis, lumbar region with neurogenic claudication: Secondary | ICD-10-CM | POA: Diagnosis not present

## 2020-08-08 MED ORDER — EZETIMIBE 10 MG PO TABS: 10.0000 mg | ORAL_TABLET | Freq: Every day | ORAL | 3 refills | Status: DC

## 2020-08-08 MED ORDER — PRAVASTATIN SODIUM 20 MG PO TABS: 20.0000 mg | ORAL_TABLET | Freq: Every evening | ORAL | 2 refills | Status: DC

## 2020-08-08 NOTE — Addendum Note (Signed)
Addended by: Loel Dubonnet on: 08/08/2020 04:16 PM   Modules accepted: Orders

## 2020-08-12 DIAGNOSIS — M48062 Spinal stenosis, lumbar region with neurogenic claudication: Secondary | ICD-10-CM | POA: Diagnosis not present

## 2020-08-16 DIAGNOSIS — M4310 Spondylolisthesis, site unspecified: Secondary | ICD-10-CM | POA: Diagnosis not present

## 2020-08-16 DIAGNOSIS — M5136 Other intervertebral disc degeneration, lumbar region: Secondary | ICD-10-CM | POA: Diagnosis not present

## 2020-08-23 ENCOUNTER — Encounter: Payer: Self-pay | Admitting: Family

## 2020-09-01 ENCOUNTER — Encounter: Payer: Self-pay | Admitting: Family Medicine

## 2020-09-01 DIAGNOSIS — E890 Postprocedural hypothyroidism: Secondary | ICD-10-CM

## 2020-09-02 ENCOUNTER — Other Ambulatory Visit: Payer: Self-pay | Admitting: Family Medicine

## 2020-09-02 DIAGNOSIS — E890 Postprocedural hypothyroidism: Secondary | ICD-10-CM

## 2020-09-02 MED ORDER — LEVOTHYROXINE SODIUM 125 MCG PO TABS: 125.0000 ug | ORAL_TABLET | Freq: Every day | ORAL | 0 refills | Status: DC

## 2020-09-16 ENCOUNTER — Other Ambulatory Visit: Payer: Self-pay | Admitting: Physician Assistant

## 2020-09-16 DIAGNOSIS — I119 Hypertensive heart disease without heart failure: Secondary | ICD-10-CM

## 2020-10-19 NOTE — Progress Notes (Unsigned)
Name: Andrea Randolph   MRN: [id number]    DOB: 04-28-1956   Date:10/20/2020       Progress Note  Subjective  Chief Complaint  Follow Up  HPI  DMII: she is currently taking Xigduo and Rybelsus. She is having tremors about 2 hours after taking her medications in the morning. She eats breakfast but it is a light meal. A1C down from 6.4 % to 6 % and she has lost 10 lbs since last visit, she states Ryblesus curbs her appetite She denies polyphagia, polydipsia or polyuria. Eye exam is up to date on ARB and statin therapy She has associated dyslipidemia, HTN, obesity and history of microalbuminuria.   Surgical hypothyroidism:last visit withDr. Wendall Mola 07/2019 and TSH was 0.593 she is currently taking 125 mcg daily, no change in bowel movements, no hair loss or dry skin, she states she has been released back to me We will recheck  TSH today.   Morbid obesity: weight is stable at this time, she is avoiding bread, also trying to cut down on sweets   OA: going on about one year, aching like, likely OA, needs refill of Voltaren   Hyperlipidemia: on Pravastatin  and Zetia, LDL back in August was 104, explained goal is below at least 100, the lower the better. She has been taking it more frequently, unable to tolerate Rosuvastatin and afraid to go up on Pravastatin dose due to previous history of myalgia . She also has LVH under the care of cardiologist denies side effects of medication.   OSA: she does not tolerate CPAP machine. Not wearing it   Thrombocytosis:last level was at goal , she wants to have labs rechecked today   Right outer hip pain: she goes to the gym and takes spin classes, she states since October she has noticed right outer hip pain , no paresthesia, she has tried icy hot, she stopped going to the gym. Discussed possible IB band syndrome, try stretching or PT referral and nsaid's for just one week.  Right neck pain: she states pain starts on nuchal area, affecting rom of  neck, does not radiate to shoulders or arms, mostly on the right side, no injury .She will take otc [place] for the next week    Patient Active Problem List   Diagnosis Date Noted  . Thrombocytosis 11/01/2019  . Carpal tunnel syndrome, bilateral 02/19/2019  . Trigger finger of right thumb 02/19/2019  . Flexor tenosynovitis of finger 02/19/2019  . Severe obesity (BMI 35.0-35.9 with comorbidity) ([place]) 12/29/2018  . Mild concentric left ventricular hypertrophy (LVH) 12/24/2017  . Mild aortic stenosis by prior echocardiogram 12/24/2017  . Mild aortic regurgitation 12/24/2017  . CKD (chronic kidney disease), stage I 06/22/2016  . Uterine fibroid 01/11/2016  . Allergic rhinitis, seasonal 05/24/2015  . Benign essential HTN 05/24/2015  . Chronic constipation 05/24/2015  . Diabetes mellitus with renal manifestation ([place]) 05/24/2015  . Dyslipidemia 05/24/2015  . Post-surgical hypothyroidism 05/24/2015  . Mixed incontinence 05/24/2015  . Microalbuminuria 05/24/2015  . Menopause 05/24/2015  . Obstructive apnea 05/24/2015  . Morbid obesity ([place]) 05/24/2015  . Hypertensive retinopathy 05/24/2015  . Vitamin D deficiency 05/24/2015  . Status post total replacement of right shoulder 03/11/2015    Past Surgical History:  Procedure Laterality Date  . BREAST BIOPSY Right 90's   benign core needle   . COLONOSCOPY WITH PROPOFOL N/A 01/26/2019   Procedure: COLONOSCOPY WITH PROPOFOL;  Surgeon: Lin Landsman, MD;  Location: [facility] ENDOSCOPY;  Service: Gastroenterology;  Laterality: N/A;  . FOOT SURGERY    . TOTAL SHOULDER REPLACEMENT Right 01/25/2015   Dr. Roland Rack  . TOTAL THYROIDECTOMY  08/01/2018    Family History  Problem Relation Age of Onset  . Heart disease Mother   . Heart disease Father   . Kidney disease Sister   . Hypertension Sister   . Diabetes Sister   . Hypertension Brother   . CVA Brother   . Breast cancer Maternal Aunt 87  . Hypertension Sister   . Kidney failure Sister    . Ovarian cancer Neg Hx   . Colon cancer Neg Hx     Social History   Tobacco Use  . Smoking status: Never Smoker  . Smokeless tobacco: Never Used  Substance Use Topics  . Alcohol use: No    Alcohol/week: 0.0 standard drinks     Current Outpatient Medications:  .  aspirin 81 MG tablet, Take by mouth., Disp: , Rfl:  .  Cholecalciferol (VITAMIN D3) 1000 units CAPS, Take 2 capsules by mouth daily., Disp: , Rfl:  .  Dapagliflozin-metFORMIN HCl ER (XIGDUO XR) 08-999 MG TB24, Take 1 tablet by mouth daily., Disp: 90 tablet, Rfl: 1 .  diclofenac Sodium (VOLTAREN) 1 % GEL, Apply 2 g topically 4 (four) times daily., Disp: 100 g, Rfl: 0 .  ezetimibe (ZETIA) 10 MG tablet, Take 1 tablet (10 mg total) by mouth daily., Disp: 90 tablet, Rfl: 3 .  glucose blood test strip, FREESTYLE LITE TEST (In Vitro Strip)  use as directed for 0 days  Quantity: 200.00;  Refills: 3   Ordered :25-Nov-2014  Vonna Kotyk ;  Started 30-Dec-2008 Active Comments: DX: 250.00, Disp: , Rfl:  .  ketoconazole (NIZORAL) 2 % cream, Apply 1 application topically daily., Disp: 60 g, Rfl: 0 .  levothyroxine (SYNTHROID) 125 MCG tablet, Take 1 tablet (125 mcg total) by mouth daily., Disp: 30 tablet, Rfl: 0 .  metaxalone (SKELAXIN) 800 MG tablet, Take 1 tablet (800 mg total) by mouth 3 (three) times daily., Disp: 90 tablet, Rfl: 0 .  Multiple Vitamin (MULTIVITAMIN) tablet, Take 1 tablet by mouth daily., Disp: , Rfl:  .  Olmesartan-amLODIPine-HCTZ 40-5-25 MG TABS, TAKE 1 TABLET DAILY (SCHEDULE APPOINTMENT FOR FURTHER REFILLS), Disp: 90 tablet, Rfl: 3 .  pravastatin (PRAVACHOL) 20 MG tablet, Take 1 tablet (20 mg total) by mouth every evening., Disp: 30 tablet, Rfl: 2 .  Semaglutide (RYBELSUS) 7 MG TABS, Take 1 tablet by mouth daily., Disp: 90 tablet, Rfl: 1  No Known Allergies  I personally reviewed active problem list, medication list, allergies, family history, social history, health maintenance with the patient/caregiver  today.   ROS  Constitutional: Negative for fever, positive  weight change.  Respiratory: Negative for cough and shortness of breath.   Cardiovascular: Negative for chest pain or palpitations.  Gastrointestinal: Negative for abdominal pain, no bowel changes.  Musculoskeletal: Negative for gait problem or joint swelling.  Skin: Negative for rash.  Neurological: Negative for dizziness or headache.  No other specific complaints in a complete review of systems (except as listed in HPI above).  Objective  Vitals:   10/20/20 1004  BP: 128/80  Pulse: 84  Resp: 16  Temp: 98 F (36.7 C)  TempSrc: Oral  SpO2: 97%  Weight: 218 lb 3.2 oz (99 kg)  Height: 5\' 4"  (1.626 m)    Body mass index is 37.45 kg/m.  Physical Exam  Constitutional: Patient appears well-developed and well-nourished. Obese  No distress.  HEENT: head atraumatic,  normocephalic, pupils equal and reactive to light, neck supple Cardiovascular: Normal rate, regular rhythm and normal heart sounds.  No murmur heard. No BLE edema. Pulmonary/Chest: Effort normal and breath sounds normal. No respiratory distress. Abdominal: Soft.  There is no tenderness. Muscular Skeletal tender during palpation of right outer hip, negative straight leg raise, no pain during palpation of lumbar spine  Psychiatric: Patient has a normal mood and affect. behavior is normal. Judgment and thought content normal.  Recent Results (from the past 2160 hour(s))  POCT HgB A1C     Status: Abnormal   Collection Time: 10/20/20 10:10 AM  Result Value Ref Range   Hemoglobin A1C 6.0 (A) 4.0 - 5.6 %   HbA1c POC (<> result, manual entry)     HbA1c, POC (prediabetic range)     HbA1c, POC (controlled diabetic range)      PHQ2/9: Depression screen [facility] 2/9 10/20/2020 10/20/2020 06/20/2020 02/26/2020 12/29/2019  Decreased Interest 0 0 0 0 0  Down, Depressed, Hopeless 0 0 0 0 0  PHQ - 2 Score 0 0 0 0 0  Altered sleeping 1 - 0 0 1  Tired, decreased energy 1 - 0 0  1  Change in appetite 0 - 0 0 1  Feeling bad or failure about yourself  0 - 0 0 0  Trouble concentrating 0 - 0 0 0  Moving slowly or fidgety/restless 0 - 0 0 0  Suicidal thoughts 0 - 0 0 0  PHQ-9 Score 2 - 0 0 3  Difficult doing work/chores Not difficult at all - - - Not difficult at all  Some recent data might be hidden    phq 9 is negative   Fall Risk: Fall Risk  10/20/2020 06/20/2020 02/26/2020 12/29/2019 10/26/2019  Falls in the past year? 0 0 0 0 0  Number falls in past yr: 0 0 0 0 0  Injury with Fall? 0 0 0 0 0    Functional Status Survey: Is the patient deaf or have difficulty hearing?: No Does the patient have difficulty seeing, even when wearing glasses/contacts?: No Does the patient have difficulty concentrating, remembering, or making decisions?: No Does the patient have difficulty walking or climbing stairs?: No Does the patient have difficulty dressing or bathing?: No Does the patient have difficulty doing errands alone such as visiting a doctor's office or shopping?: No   Assessment & Plan  1. Controlled type 2 diabetes mellitus with stage 3 chronic kidney disease, without long-term current use of insulin (HCC)  - POCT HgB A1C - Dapagliflozin-metFORMIN HCl ER (XIGDUO XR) 08-999 MG TB24; Take 1 tablet by mouth daily.  Dispense: 90 tablet; Refill: 1 - Semaglutide (RYBELSUS) 7 MG TABS; Take 1 tablet by mouth daily.  Dispense: 90 tablet; Refill: 1  2. Primary osteoarthritis of both knees  - diclofenac Sodium (VOLTAREN) 1 % GEL; Apply 2 g topically 4 (four) times daily.  Dispense: 300 g; Refill: 0  3. Post-surgical hypothyroidism  - levothyroxine (SYNTHROID) 125 MCG tablet; Take 1 tablet (125 mcg total) by mouth daily.  Dispense: 90 tablet; Refill: 1  4. Obstructive apnea   5. Benign essential HTN  At goal   6. Dyslipidemia  On medication   7. Hypertension associated with diabetes ([place])   8. Vitamin D deficiency   9. Hypertensive left ventricular  hypertrophy, without heart failure   10. Morbid obesity ([place])  Discussed with the patient the risk posed by an increased BMI. Discussed importance of portion control, calorie counting and  at least 150 minutes of physical activity weekly. Avoid sweet beverages and drink more water. Eat at least 6 servings of fruit and vegetables daily  Lost 10 lbs since last visit   11. It band syndrome, right  We will try home exercises

## 2020-10-20 ENCOUNTER — Ambulatory Visit: Payer: BC Managed Care – PPO | Admitting: Family Medicine

## 2020-10-20 ENCOUNTER — Encounter: Payer: Self-pay | Admitting: Family Medicine

## 2020-10-20 ENCOUNTER — Other Ambulatory Visit: Payer: Self-pay

## 2020-10-20 VITALS — BP 128/80 | HR 84 | Temp 98.0°F | Resp 16 | Ht 64.0 in | Wt 218.2 lb

## 2020-10-20 DIAGNOSIS — E1159 Type 2 diabetes mellitus with other circulatory complications: Secondary | ICD-10-CM

## 2020-10-20 DIAGNOSIS — I10 Essential (primary) hypertension: Secondary | ICD-10-CM | POA: Diagnosis not present

## 2020-10-20 DIAGNOSIS — I152 Hypertension secondary to endocrine disorders: Secondary | ICD-10-CM

## 2020-10-20 DIAGNOSIS — I119 Hypertensive heart disease without heart failure: Secondary | ICD-10-CM

## 2020-10-20 DIAGNOSIS — M170 Bilateral primary osteoarthritis of knee: Secondary | ICD-10-CM

## 2020-10-20 DIAGNOSIS — E785 Hyperlipidemia, unspecified: Secondary | ICD-10-CM

## 2020-10-20 DIAGNOSIS — G4733 Obstructive sleep apnea (adult) (pediatric): Secondary | ICD-10-CM | POA: Diagnosis not present

## 2020-10-20 DIAGNOSIS — E1122 Type 2 diabetes mellitus with diabetic chronic kidney disease: Secondary | ICD-10-CM

## 2020-10-20 DIAGNOSIS — E890 Postprocedural hypothyroidism: Secondary | ICD-10-CM

## 2020-10-20 DIAGNOSIS — M7631 Iliotibial band syndrome, right leg: Secondary | ICD-10-CM

## 2020-10-20 DIAGNOSIS — N183 Chronic kidney disease, stage 3 unspecified: Secondary | ICD-10-CM

## 2020-10-20 DIAGNOSIS — E559 Vitamin D deficiency, unspecified: Secondary | ICD-10-CM

## 2020-10-20 LAB — POCT GLYCOSYLATED HEMOGLOBIN (HGB A1C): Hemoglobin A1C: 6 % — AB (ref 4.0–5.6)

## 2020-10-20 MED ORDER — DICLOFENAC SODIUM 1 % EX GEL: 2.0000 g | Freq: Four times a day (QID) | CUTANEOUS | 0 refills | Status: DC

## 2020-10-20 MED ORDER — LEVOTHYROXINE SODIUM 125 MCG PO TABS: 125.0000 ug | ORAL_TABLET | Freq: Every day | ORAL | 1 refills | Status: DC

## 2020-10-20 MED ORDER — XIGDUO XR 10-1000 MG PO TB24: 1.0000 | ORAL_TABLET | Freq: Every day | ORAL | 1 refills | Status: DC

## 2020-10-20 MED ORDER — RYBELSUS 7 MG PO TABS: 1.0000 | ORAL_TABLET | Freq: Every day | ORAL | 1 refills | Status: DC

## 2020-10-20 NOTE — Patient Instructions (Signed)
Iliotibial Band Syndrome Rehab Ask your health care provider which exercises are safe for you. Do exercises exactly as told by your health care provider and adjust them as directed. It is normal to feel mild stretching, pulling, tightness, or discomfort as you do these exercises. Stop right away if you feel sudden pain or your pain gets significantly worse. Do not begin these exercises until told by your health care provider. Stretching and range-of-motion exercises These exercises warm up your muscles and joints and improve the movement and flexibility of your hip and pelvis. Quadriceps stretch, prone  1. Lie on your abdomen on a firm surface, such as a bed or padded floor (prone position). 2. Bend your left / right knee and reach back to hold your ankle or pant leg. If you cannot reach your ankle or pant leg, loop a belt around your foot and grab the belt instead. 3. Gently pull your heel toward your buttocks. Your knee should not slide out to the side. You should feel a stretch in the front of your thigh and knee (quadriceps). 4. Hold this position for __________ seconds. Repeat __________ times. Complete this exercise __________ times a day. Iliotibial band stretch An iliotibial band is a strong band of muscle tissue that runs from the outer side of your hip to the outer side of your thigh and knee. 1. Lie on your side with your left / right leg in the top position. 2. Bend both of your knees and grab your left / right ankle. Stretch out your bottom arm to help you balance. 3. Slowly bring your top knee back so your thigh goes behind your trunk. 4. Slowly lower your top leg toward the floor until you feel a gentle stretch on the outside of your left / right hip and thigh. If you do not feel a stretch and your knee will not fall farther, place the heel of your other foot on top of your knee and pull your knee down toward the floor with your foot. 5. Hold this position for __________  seconds. Repeat __________ times. Complete this exercise __________ times a day. Strengthening exercises These exercises build strength and endurance in your hip and pelvis. Endurance is the ability to use your muscles for a long time, even after they get tired. Straight leg raises, side-lying This exercise strengthens the muscles that rotate the leg at the hip and move it away from your body (hip abductors). 1. Lie on your side with your left / right leg in the top position. Lie so your head, shoulder, hip, and knee line up. You may bend your bottom knee to help you balance. 2. Roll your hips slightly forward so your hips are stacked directly over each other and your left / right knee is facing forward. 3. Tense the muscles in your outer thigh and lift your top leg 4-6 inches (10-15 cm). 4. Hold this position for __________ seconds. 5. Slowly return to the starting position. Let your muscles relax completely before doing another repetition. Repeat __________ times. Complete this exercise __________ times a day. Leg raises, prone This exercise strengthens the muscles that move the hips (hip extensors). 1. Lie on your abdomen on your bed or a firm surface. You can put a pillow under your hips if that is more comfortable for your lower back. 2. Bend your left / right knee so your foot is straight up in the air. 3. Squeeze your buttocks muscles and lift your left / right thigh   off the bed. Do not let your back arch. 4. Tense your thigh muscle as hard as you can without increasing any knee pain. 5. Hold this position for __________ seconds. 6. Slowly lower your leg to the starting position and allow it to relax completely. Repeat __________ times. Complete this exercise __________ times a day. Hip hike 1. Stand sideways on a bottom step. Stand on your left / right leg with your other foot unsupported next to the step. You can hold on to the railing or wall for balance if needed. 2. Keep your knees  straight and your torso square. Then lift your left / right hip up toward the ceiling. 3. Slowly let your left / right hip lower toward the floor, past the starting position. Your foot should get closer to the floor. Do not lean or bend your knees. Repeat __________ times. Complete this exercise __________ times a day. This information is not intended to replace advice given to you by your health care provider. Make sure you discuss any questions you have with your health care provider. Document Revised: 02/19/2019 Document Reviewed: 08/20/2018 Elsevier Patient Education  2020 [employer]  

## 2020-10-21 LAB — CBC WITH DIFFERENTIAL/PLATELET
Absolute Monocytes: 340 cells/uL (ref 200–950)
Basophils Absolute: 39 cells/uL (ref 0–200)
Basophils Relative: 0.9 %
Eosinophils Absolute: 99 cells/uL (ref 15–500)
Eosinophils Relative: 2.3 %
HCT: 40 % (ref 35.0–45.0)
Hemoglobin: 13.4 g/dL (ref 11.7–15.5)
Lymphs Abs: 1931 cells/uL (ref 850–3900)
MCH: 27.9 pg (ref 27.0–33.0)
MCHC: 33.5 g/dL (ref 32.0–36.0)
MCV: 83.2 fL (ref 80.0–100.0)
MPV: 9.7 fL (ref 7.5–12.5)
Monocytes Relative: 7.9 %
Neutro Abs: 1892 cells/uL (ref 1500–7800)
Neutrophils Relative %: 44 %
Platelets: 369 10*3/uL (ref 140–400)
RBC: 4.81 10*6/uL (ref 3.80–5.10)
RDW: 13.3 % (ref 11.0–15.0)
Total Lymphocyte: 44.9 %
WBC: 4.3 10*3/uL (ref 3.8–10.8)

## 2020-10-21 LAB — COMPLETE METABOLIC PANEL WITH GFR
AG Ratio: 1.5 (calc) (ref 1.0–2.5)
ALT: 16 U/L (ref 6–29)
AST: 17 U/L (ref 10–35)
Albumin: 4.3 g/dL (ref 3.6–5.1)
Alkaline phosphatase (APISO): 44 U/L (ref 37–153)
BUN/Creatinine Ratio: 18 (calc) (ref 6–22)
BUN: 19 mg/dL (ref 7–25)
CO2: 30 mmol/L (ref 20–32)
Calcium: 9.8 mg/dL (ref 8.6–10.4)
Chloride: 105 mmol/L (ref 98–110)
Creat: 1.04 mg/dL — ABNORMAL HIGH (ref 0.50–0.99)
GFR, Est African American: 66 mL/min/{1.73_m2} (ref >=60)
GFR, Est Non African American: 57 mL/min/{1.73_m2} — ABNORMAL LOW (ref >=60)
Globulin: 2.8 g/dL (calc) (ref 1.9–3.7)
Glucose, Bld: 85 mg/dL (ref 65–99)
Potassium: 4 mmol/L (ref 3.5–5.3)
Sodium: 143 mmol/L (ref 135–146)
Total Bilirubin: 0.4 mg/dL (ref 0.2–1.2)
Total Protein: 7.1 g/dL (ref 6.1–8.1)

## 2020-10-21 LAB — LIPID PANEL
Cholesterol: 176 mg/dL (ref <200)
HDL: 48 mg/dL — ABNORMAL LOW (ref >=50)
LDL Cholesterol (Calc): 112 mg/dL (calc) — ABNORMAL HIGH
Non-HDL Cholesterol (Calc): 128 mg/dL (calc) (ref <130)
Total CHOL/HDL Ratio: 3.7 (calc) (ref <5.0)
Triglycerides: 70 mg/dL (ref <150)

## 2020-10-21 LAB — TSH: TSH: 0.2 mIU/L — ABNORMAL LOW (ref 0.40–4.50)

## 2020-10-24 ENCOUNTER — Other Ambulatory Visit: Payer: Self-pay | Admitting: Family Medicine

## 2020-10-24 DIAGNOSIS — E890 Postprocedural hypothyroidism: Secondary | ICD-10-CM

## 2020-10-26 ENCOUNTER — Other Ambulatory Visit: Payer: Self-pay

## 2020-10-26 DIAGNOSIS — M170 Bilateral primary osteoarthritis of knee: Secondary | ICD-10-CM

## 2020-10-26 MED ORDER — DICLOFENAC SODIUM 1 % EX GEL: 2.0000 g | Freq: Four times a day (QID) | CUTANEOUS | 0 refills | Status: DC

## 2020-10-28 ENCOUNTER — Other Ambulatory Visit: Payer: Self-pay

## 2020-10-28 DIAGNOSIS — M170 Bilateral primary osteoarthritis of knee: Secondary | ICD-10-CM

## 2020-10-28 MED ORDER — DICLOFENAC SODIUM 1 % EX GEL: 2.0000 g | Freq: Four times a day (QID) | CUTANEOUS | 0 refills | Status: DC

## 2020-11-01 ENCOUNTER — Other Ambulatory Visit: Payer: Self-pay

## 2020-11-07 ENCOUNTER — Telehealth: Payer: Self-pay

## 2020-11-07 NOTE — Telephone Encounter (Signed)
Copied from CRM [id number]. Topic: General - Inquiry >> Nov 07, 2020 11:40 AM Adrian Prince D wrote: Reason for CRM: patient insurance no longer covers Voltaren gel (generic) pharmacy called to get it switched to something that is covered. Please give the pharmacy a call back at [phone]. Ref #[id number]

## 2020-11-24 ENCOUNTER — Other Ambulatory Visit: Payer: Self-pay | Admitting: Physician Assistant

## 2020-11-24 DIAGNOSIS — E782 Mixed hyperlipidemia: Secondary | ICD-10-CM

## 2020-12-08 ENCOUNTER — Other Ambulatory Visit: Payer: Self-pay | Admitting: Family Medicine

## 2020-12-08 DIAGNOSIS — E890 Postprocedural hypothyroidism: Secondary | ICD-10-CM

## 2020-12-09 ENCOUNTER — Other Ambulatory Visit: Payer: Self-pay | Admitting: Family

## 2020-12-09 DIAGNOSIS — E782 Mixed hyperlipidemia: Secondary | ICD-10-CM

## 2020-12-13 ENCOUNTER — Other Ambulatory Visit: Payer: Self-pay | Admitting: Family Medicine

## 2020-12-13 DIAGNOSIS — Z1231 Encounter for screening mammogram for malignant neoplasm of breast: Secondary | ICD-10-CM

## 2021-01-12 ENCOUNTER — Other Ambulatory Visit: Payer: Self-pay

## 2021-01-12 ENCOUNTER — Ambulatory Visit
Admission: RE | Admit: 2021-01-12 | Discharge: 2021-01-12 | Disposition: A | Discharge status: Home / Self Care | Payer: BC Managed Care – PPO | Source: Ambulatory Visit | Attending: Family Medicine | Admitting: Family Medicine

## 2021-01-12 ENCOUNTER — Other Ambulatory Visit: Payer: Self-pay | Admitting: Family Medicine

## 2021-01-12 DIAGNOSIS — R928 Other abnormal and inconclusive findings on diagnostic imaging of breast: Secondary | ICD-10-CM

## 2021-01-12 DIAGNOSIS — N6489 Other specified disorders of breast: Secondary | ICD-10-CM

## 2021-01-12 DIAGNOSIS — Z1231 Encounter for screening mammogram for malignant neoplasm of breast: Secondary | ICD-10-CM

## 2021-01-25 NOTE — Progress Notes (Signed)
Name: Andrea Randolph   MRN: [id number]    DOB: 05/25/1956   Date:01/26/2021       Progress Note  Subjective  Chief Complaint  Annual Exam and Follow Up  HPI  Patient presents for annual CPE.  DMII: she is currently taking Xigduo and Rybelsus. She is having tremors about 2 hours after taking her medications in the morning. She eats breakfast but it is a light meal. A1C down from 6.4 % to 6 % and she has lost another 5 lbs since last visit. She has dyslipidemia, HTN and obesity. She is on ARB, pravastatin and Zetia. We will recheck labs today  LVH and aortic stenosis: under care of Dr. Rockey Situ, no chest pain, palpitation or sob   Vitamin D low: she accidentally got a 5000 unit supplement and took two daily for a few days, now taking once daily , we will recheck level   Surgical hypothyroidism:last level was done 10/2020 was suppressed at 0.20  she is currently taking 125 mcg daily, she was advised to skip on Sundays but she forgets and takes it anyway. We will recheck and if still suppressed we will adjust dose to 112 mcg daily   Morbid obesity: weight is down since last visit, not able to eat much due to some nausea   OA/Knee right side is the worse : not daily, she states Voltaren not covered by insurance, taking prn Tylenol   Hyperlipidemia: on Pravastatin  and Zetia, LDL back in August was 104, explained goal is below at least 100, the lower the better. She has been taking it more frequently, unable to tolerate Rosuvastatin and afraid to go up on Pravastatin dose due to previous history of myalgia . She also has LVH under the care of cardiologist denies side effects of medication.   OSA: she does not tolerate CPAP machine. Still not wearing it   Abdominal pain: she developed acute onset of lower abdominal pain, dizziness, one day of fever, nausea but no vomiting or change in bowel movements about two weeks ago. COVID-19 test was negative. She states since than she has started a fast  for lent, but still does not feel back to normal, she has felt some knots on the upper abdomen, still has some nausea and appetite is not back to normal, also passing flatus than usual.  Mild weigh tloss  Diet: current on a church fast.  Exercise: continue at least 150 minutes of physical activity per week.   [place] [facility] Visit from 10/20/2020 in [facility]  AUDIT-C Score 0     Depression: Phq 9 is  negative Depression screen [facility] 2/9 01/26/2021 10/20/2020 10/20/2020 06/20/2020 02/26/2020  Decreased Interest 0 0 0 0 0  Down, Depressed, Hopeless 0 0 0 0 0  PHQ - 2 Score 0 0 0 0 0  Altered sleeping 0 1 - 0 0  Tired, decreased energy 0 1 - 0 0  Change in appetite 0 0 - 0 0  Feeling bad or failure about yourself  0 0 - 0 0  Trouble concentrating 0 0 - 0 0  Moving slowly or fidgety/restless 0 0 - 0 0  Suicidal thoughts 0 0 - 0 0  PHQ-9 Score 0 2 - 0 0  Difficult doing work/chores - Not difficult at all - - -  Some recent data might be hidden   Hypertension: BP Readings from Last 3 Encounters:  01/26/21 130/82  10/20/20 128/80  06/27/20 130/78  Obesity: Wt Readings from Last 3 Encounters:  01/26/21 213 lb (96.6 kg)  10/20/20 218 lb 3.2 oz (99 kg)  06/27/20 228 lb 6.4 oz (103.6 kg)   BMI Readings from Last 3 Encounters:  01/26/21 36.56 kg/m  10/20/20 37.45 kg/m  06/27/20 39.20 kg/m     Vaccines:   Shingrix: up to date  Pneumonia: up to date  Flu: up to date  COVID-31: discussed importance of getting it   Hep C Screening: 12/24/17 STD testing and prevention (HIV/chl/gon/syphilis): 12/24/17 Intimate partner violence: negative Sexual History : not in the past 12 months  Menstrual History/LMP/Abnormal Bleeding: discussed post-menopausal bleeding  Incontinence Symptoms: mild stress incontinence, wears a panty liner  Breast cancer:  - Last Mammogram: 01/12/21, going for add views today  - BRCA gene screening: N/A  Osteoporosis: Discussed high  calcium and vitamin D supplementation, weight bearing exercises  Cervical cancer screening: Ordered today  Skin cancer: Discussed monitoring for atypical lesions  Colorectal cancer: 01/26/19   Lung cancer: Low Dose CT Chest recommended if Age 64-80 years, 39 pack-year currently smoking OR have quit w/in 15years. Patient does not qualify.   ECG: 06/17/20  Advanced Care Planning: A voluntary discussion about advance care planning including the explanation and discussion of advance directives.  Discussed health care proxy and Living will, and the patient was able to identify a health care proxy as daughter .    Lipids: Lab Results  Component Value Date   CHOL 176 10/20/2020   CHOL 168 06/20/2020   CHOL 154 10/26/2019   Lab Results  Component Value Date   HDL 48 (L) 10/20/2020   HDL 47 (L) 06/20/2020   HDL 41 (L) 10/26/2019   Lab Results  Component Value Date   LDLCALC 112 (H) 10/20/2020   LDLCALC 104 (H) 06/20/2020   LDLCALC 98 10/26/2019   Lab Results  Component Value Date   TRIG 70 10/20/2020   TRIG 83 06/20/2020   TRIG 64 10/26/2019   Lab Results  Component Value Date   CHOLHDL 3.7 10/20/2020   CHOLHDL 3.6 06/20/2020   CHOLHDL 3.8 10/26/2019   No results found for: LDLDIRECT  Glucose: Glucose, Bld  Date Value Ref Range Status  10/20/2020 85 65 - 99 mg/dL Final    Comment:    .            Fasting reference interval .   06/20/2020 89 65 - 99 mg/dL Final    Comment:    .            Fasting reference interval .   10/26/2019 104 (H) 65 - 99 mg/dL Final    Comment:    .            Fasting reference interval . For someone without known diabetes, a glucose value between 100 and 125 mg/dL is consistent with prediabetes and should be confirmed with a follow-up test. .    Glucose-Capillary  Date Value Ref Range Status  01/26/2019 98 70 - 99 mg/dL Final    Patient Active Problem List   Diagnosis Date Noted  . Thrombocytosis 11/01/2019  . Carpal tunnel  syndrome, bilateral 02/19/2019  . Trigger finger of right thumb 02/19/2019  . Flexor tenosynovitis of finger 02/19/2019  . Severe obesity (BMI 35.0-35.9 with comorbidity) ([place]) 12/29/2018  . Mild concentric left ventricular hypertrophy (LVH) 12/24/2017  . Mild aortic stenosis by prior echocardiogram 12/24/2017  . Mild aortic regurgitation 12/24/2017  . CKD (chronic kidney disease), stage I  06/22/2016  . Uterine fibroid 01/11/2016  . Allergic rhinitis, seasonal 05/24/2015  . Benign essential HTN 05/24/2015  . Chronic constipation 05/24/2015  . Diabetes mellitus with renal manifestation ([place]) 05/24/2015  . Dyslipidemia 05/24/2015  . Post-surgical hypothyroidism 05/24/2015  . Mixed incontinence 05/24/2015  . Microalbuminuria 05/24/2015  . Menopause 05/24/2015  . Obstructive apnea 05/24/2015  . Morbid obesity ([place]) 05/24/2015  . Hypertensive retinopathy 05/24/2015  . Vitamin D deficiency 05/24/2015  . Status post total replacement of right shoulder 03/11/2015    Past Surgical History:  Procedure Laterality Date  . BREAST BIOPSY Right 90's   benign core needle   . COLONOSCOPY WITH PROPOFOL N/A 01/26/2019   Procedure: COLONOSCOPY WITH PROPOFOL;  Surgeon: Lin Landsman, MD;  Location: [facility] ENDOSCOPY;  Service: Gastroenterology;  Laterality: N/A;  . FOOT SURGERY    . TOTAL SHOULDER REPLACEMENT Right 01/25/2015   Dr. Roland Rack  . TOTAL THYROIDECTOMY  08/01/2018    Family History  Problem Relation Age of Onset  . Heart disease Mother   . Heart disease Father   . Kidney disease Sister   . Hypertension Sister   . Diabetes Sister   . Hypertension Brother   . CVA Brother   . Breast cancer Maternal Aunt 59  . Hypertension Sister   . Kidney failure Sister   . Ovarian cancer Neg Hx   . Colon cancer Neg Hx     Social History   Socioeconomic History  . Marital status: Legally Separated    Spouse name: Not on file  . Number of children: 1  . Years of education: Not on file  .  Highest education level: 12th grade  Occupational History  . Occupation: Cytogeneticist  Tobacco Use  . Smoking status: Never Smoker  . Smokeless tobacco: Never Used  Vaping Use  . Vaping Use: Never used  Substance and Sexual Activity  . Alcohol use: No    Alcohol/week: 0.0 standard drinks  . Drug use: No  . Sexual activity: Not Currently    Birth control/protection: Post-menopausal  Other Topics Concern  . Not on file  Social History Narrative   Separated in around 2009.   She was living with her daughter, however currently moved in with her sister and nephew , but is looking for a house.    Social Determinants of Health   Financial Resource Strain: Low Risk   . Difficulty of Paying Living Expenses: Not hard at all  Food Insecurity: No Food Insecurity  . Worried About Charity fundraiser in the Last Year: Never true  . Ran Out of Food in the Last Year: Never true  Transportation Needs: No Transportation Needs  . Lack of Transportation (Medical): No  . Lack of Transportation (Non-Medical): No  Physical Activity: Sufficiently Active  . Days of Exercise per Week: 3 days  . Minutes of Exercise per Session: 60 min  Stress: No Stress Concern Present  . Feeling of Stress : Only a little  Social Connections: Moderately Integrated  . Frequency of Communication with Friends and Family: More than three times a week  . Frequency of Social Gatherings with Friends and Family: More than three times a week  . Attends Religious Services: More than 4 times per year  . Active Member of Clubs or Organizations: Yes  . Attends Archivist Meetings: More than 4 times per year  . Marital Status: Separated  Intimate Partner Violence: Not At Risk  . Fear of Current or Ex-Partner: No  .  Emotionally Abused: No  . Physically Abused: No  . Sexually Abused: No     Current Outpatient Medications:  .  aspirin 81 MG tablet, Take by mouth., Disp: , Rfl:  .  Cholecalciferol (VITAMIN  D3) 1000 units CAPS, Take 2 capsules by mouth daily., Disp: , Rfl:  .  Dapagliflozin-metFORMIN HCl ER (XIGDUO XR) 08-999 MG TB24, Take 1 tablet by mouth daily., Disp: 90 tablet, Rfl: 1 .  diclofenac Sodium (VOLTAREN) 1 % GEL, Apply 2 g topically 4 (four) times daily., Disp: 300 g, Rfl: 0 .  ezetimibe (ZETIA) 10 MG tablet, TAKE 1 TABLET DAILY, Disp: 90 tablet, Rfl: 1 .  glucose blood test strip, FREESTYLE LITE TEST (In Vitro Strip)  use as directed for 0 days  Quantity: 200.00;  Refills: 3   Ordered :25-Nov-2014  Vonna Kotyk ;  Started 30-Dec-2008 Active Comments: DX: 250.00, Disp: , Rfl:  .  ketoconazole (NIZORAL) 2 % cream, Apply 1 application topically daily., Disp: 60 g, Rfl: 0 .  levothyroxine (SYNTHROID) 125 MCG tablet, Take 1 tablet (125 mcg total) by mouth daily., Disp: 90 tablet, Rfl: 1 .  Multiple Vitamin (MULTIVITAMIN) tablet, Take 1 tablet by mouth daily., Disp: , Rfl:  .  Olmesartan-amLODIPine-HCTZ 40-5-25 MG TABS, TAKE 1 TABLET DAILY (SCHEDULE APPOINTMENT FOR FURTHER REFILLS), Disp: 90 tablet, Rfl: 3 .  pravastatin (PRAVACHOL) 20 MG tablet, TAKE 1 TABLET BY MOUTH ONCE DAILY IN THE EVENING, Disp: 30 tablet, Rfl: 5 .  Semaglutide (RYBELSUS) 7 MG TABS, Take 1 tablet by mouth daily., Disp: 90 tablet, Rfl: 1 .  metaxalone (SKELAXIN) 800 MG tablet, Take 1 tablet (800 mg total) by mouth 3 (three) times daily. (Patient not taking: Reported on 01/26/2021), Disp: 90 tablet, Rfl: 0  Allergies  Allergen Reactions  . Rosuvastatin Other (See Comments)    Myalgia      ROS  Constitutional: Negative for fever , positive for mild weight change.  Respiratory: Negative for cough and shortness of breath.   Cardiovascular: Negative for chest pain or palpitations.  Gastrointestinal: positive for abdominal pain, no bowel changes.  Musculoskeletal: Negative for gait problem or joint swelling.  Skin: Negative for rash.  Neurological: Negative for dizziness or headache.  No other specific  complaints in a complete review of systems (except as listed in HPI above).  Objective  Vitals:   01/26/21 0902  BP: 130/82  Pulse: 85  Resp: 16  Temp: 98.2 F (36.8 C)  TempSrc: Oral  SpO2: 98%  Weight: 213 lb (96.6 kg)  Height: _0  (1.626 m)    Body mass index is 36.56 kg/m.  Physical Exam  Constitutional: Patient appears well-developed and well-nourished. No distress.  HENT: Head: Normocephalic and atraumatic. Ears: B TMs ok, no erythema or effusion; Nose: Not done. Mouth/Throat: not done Eyes: Conjunctivae and EOM are normal. Pupils are equal, round, and reactive to light. No scleral icterus.  Neck: Normal range of motion. Neck supple. No JVD present. No thyromegaly present.  Cardiovascular: Normal rate, regular rhythm and normal heart sounds.  No murmur heard. No BLE edema. Pulmonary/Chest: Effort normal and breath sounds normal. No respiratory distress. Abdominal: Soft. Bowel sounds are normal, no distension. There is RUQ tenderness and subcutaneous nodules on upper abdomen, worse on right side Breast: no lumps or masses, no nipple discharge or rashes FEMALE GENITALIA:  External genitalia normal External urethra normal Vaginal vault normal without discharge or lesions Cervix normal without discharge or lesions Bimanual exam normal without masses RECTAL: not done  Musculoskeletal: Normal range of motion, no joint effusions. No gross deformities Neurological: he is alert and oriented to person, place, and time. No cranial nerve deficit. Coordination, balance, strength, speech and gait are normal.  Skin: Skin is warm and dry. No rash noted. No erythema.  Psychiatric: Patient has a normal mood and affect. behavior is normal. Judgment and thought content normal.  Recent Results (from the past 2160 hour(s))  POCT HgB A1C     Status: Normal   Collection Time: 01/26/21  9:04 AM  Result Value Ref Range   Hemoglobin A1C 5.6 4.0 - 5.6 %   HbA1c POC (<> result, manual  entry)     HbA1c, POC (prediabetic range)     HbA1c, POC (controlled diabetic range)        Fall Risk: Fall Risk  01/26/2021 10/20/2020 06/20/2020 02/26/2020 12/29/2019  Falls in the past year? 0 0 0 0 0  Number falls in past yr: 0 0 0 0 0  Injury with Fall? 0 0 0 0 0    Functional Status Survey: Is the patient deaf or have difficulty hearing?: No Does the patient have difficulty seeing, even when wearing glasses/contacts?: No Does the patient have difficulty concentrating, remembering, or making decisions?: No Does the patient have difficulty walking or climbing stairs?: No Does the patient have difficulty dressing or bathing?: No Does the patient have difficulty doing errands alone such as visiting a doctor's office or shopping?: No   Assessment & Plan  1. Diabetes mellitus type 2 in obese (HCC)  - POCT HgB A1C  2. Dyslipidemia due to type 2 diabetes mellitus ([place])  - Lipid panel  3. Well adult exam   4. Morbid obesity ([place])  Discussed with the patient the risk posed by an increased BMI. Discussed importance of portion control, calorie counting and at least 150 minutes of physical activity weekly. Avoid sweet beverages and drink more water. Eat at least 6 servings of fruit and vegetables daily   5. Benign essential HTN  At goal   6. Vitamin D deficiency  - VITAMIN D 25 Hydroxy (Vit-D Deficiency, Fractures)  7. Cervical cancer screening  - Cytology - PAP  8. Dyslipidemia   9. Primary osteoarthritis of both knees   10. Obstructive apnea   11. Hypertension associated with diabetes ([place])   12. Post-surgical hypothyroidism  - TSH  13. Abdominal pain, unspecified abdominal location  - H. pylori breath test - CBC with Differential/Platelet - COMPLETE METABOLIC PANEL WITH GFR  14. RUQ pain  - US Abdomen Limited RUQ (LIVER/GB); Future  15. Subcutaneous nodule of abdominal wall  - US Abdomen Limited RUQ (LIVER/GB); Future  -USPSTF grade A and B  recommendations reviewed with patient; age-appropriate recommendations, preventive care, screening tests, etc discussed and encouraged; healthy living encouraged; see AVS for patient education given to patient -Discussed importance of 150 minutes of physical activity weekly, eat two servings of fish weekly, eat one serving of tree nuts ( cashews, pistachios, pecans, almonds.Marland Kitchen) every other day, eat 6 servings of fruit/vegetables daily and drink plenty of water and avoid sweet beverages.

## 2021-01-26 ENCOUNTER — Other Ambulatory Visit: Payer: Self-pay

## 2021-01-26 ENCOUNTER — Ambulatory Visit
Admission: RE | Admit: 2021-01-26 | Discharge: 2021-01-26 | Disposition: A | Discharge status: Home / Self Care | Payer: BC Managed Care – PPO | Source: Ambulatory Visit | Attending: Family Medicine | Admitting: Family Medicine

## 2021-01-26 ENCOUNTER — Encounter: Payer: Self-pay | Admitting: Family Medicine

## 2021-01-26 ENCOUNTER — Other Ambulatory Visit (HOSPITAL_COMMUNITY)
Admission: RE | Admit: 2021-01-26 | Discharge: 2021-01-26 | Disposition: A | Discharge status: Home / Self Care | Payer: BC Managed Care – PPO | Source: Ambulatory Visit | Attending: Family Medicine | Admitting: Family Medicine

## 2021-01-26 ENCOUNTER — Other Ambulatory Visit: Payer: Self-pay | Admitting: Family Medicine

## 2021-01-26 ENCOUNTER — Ambulatory Visit (INDEPENDENT_AMBULATORY_CARE_PROVIDER_SITE_OTHER): Payer: BC Managed Care – PPO | Admitting: Family Medicine

## 2021-01-26 VITALS — BP 130/82 | HR 85 | Temp 98.2°F | Resp 16 | Ht 64.0 in | Wt 213.0 lb

## 2021-01-26 DIAGNOSIS — R109 Unspecified abdominal pain: Secondary | ICD-10-CM

## 2021-01-26 DIAGNOSIS — E890 Postprocedural hypothyroidism: Secondary | ICD-10-CM

## 2021-01-26 DIAGNOSIS — R928 Other abnormal and inconclusive findings on diagnostic imaging of breast: Secondary | ICD-10-CM | POA: Insufficient documentation

## 2021-01-26 DIAGNOSIS — E785 Hyperlipidemia, unspecified: Secondary | ICD-10-CM

## 2021-01-26 DIAGNOSIS — R222 Localized swelling, mass and lump, trunk: Secondary | ICD-10-CM

## 2021-01-26 DIAGNOSIS — Z124 Encounter for screening for malignant neoplasm of cervix: Secondary | ICD-10-CM | POA: Insufficient documentation

## 2021-01-26 DIAGNOSIS — E559 Vitamin D deficiency, unspecified: Secondary | ICD-10-CM

## 2021-01-26 DIAGNOSIS — R922 Inconclusive mammogram: Secondary | ICD-10-CM | POA: Diagnosis not present

## 2021-01-26 DIAGNOSIS — I10 Essential (primary) hypertension: Secondary | ICD-10-CM

## 2021-01-26 DIAGNOSIS — E1169 Type 2 diabetes mellitus with other specified complication: Secondary | ICD-10-CM | POA: Diagnosis not present

## 2021-01-26 DIAGNOSIS — N6310 Unspecified lump in the right breast, unspecified quadrant: Secondary | ICD-10-CM

## 2021-01-26 DIAGNOSIS — Z0000 Encounter for general adult medical examination without abnormal findings: Secondary | ICD-10-CM

## 2021-01-26 DIAGNOSIS — R1011 Right upper quadrant pain: Secondary | ICD-10-CM

## 2021-01-26 DIAGNOSIS — M170 Bilateral primary osteoarthritis of knee: Secondary | ICD-10-CM

## 2021-01-26 DIAGNOSIS — E669 Obesity, unspecified: Secondary | ICD-10-CM | POA: Diagnosis not present

## 2021-01-26 DIAGNOSIS — E1122 Type 2 diabetes mellitus with diabetic chronic kidney disease: Secondary | ICD-10-CM

## 2021-01-26 DIAGNOSIS — N6489 Other specified disorders of breast: Secondary | ICD-10-CM | POA: Insufficient documentation

## 2021-01-26 DIAGNOSIS — I152 Hypertension secondary to endocrine disorders: Secondary | ICD-10-CM

## 2021-01-26 DIAGNOSIS — G4733 Obstructive sleep apnea (adult) (pediatric): Secondary | ICD-10-CM

## 2021-01-26 DIAGNOSIS — E1159 Type 2 diabetes mellitus with other circulatory complications: Secondary | ICD-10-CM

## 2021-01-26 LAB — POCT GLYCOSYLATED HEMOGLOBIN (HGB A1C): Hemoglobin A1C: 5.6 % (ref 4.0–5.6)

## 2021-01-26 MED ORDER — DAPAGLIFLOZIN PROPANEDIOL 10 MG PO TABS: 10.0000 mg | ORAL_TABLET | Freq: Every day | ORAL | 0 refills | Status: DC

## 2021-01-26 NOTE — Patient Instructions (Signed)
Preventive Care 84-64 Years Old, Female Preventive care refers to lifestyle choices and visits with your health care provider that can promote health and wellness. This includes:  A yearly physical exam. This is also called an annual wellness visit.  Regular dental and eye exams.  Immunizations.  Screening for certain conditions.  Healthy lifestyle choices, such as: ? Eating a healthy diet. ? Getting regular exercise. ? Not using drugs or products that contain nicotine and tobacco. ? Limiting alcohol use. What can I expect for my preventive care visit? Physical exam Your health care provider will check your:  Height and weight. These may be used to calculate your BMI (body mass index). BMI is a measurement that tells if you are at a healthy weight.  Heart rate and blood pressure.  Body temperature.  Skin for abnormal spots. Counseling Your health care provider may ask you questions about your:  Past medical problems.  Family's medical history.  Alcohol, tobacco, and drug use.  Emotional well-being.  Home life and relationship well-being.  Sexual activity.  Diet, exercise, and sleep habits.  Work and work Statistician.  Access to firearms.  Method of birth control.  Menstrual cycle.  Pregnancy history. What immunizations do I need? Vaccines are usually given at various ages, according to a schedule. Your health care provider will recommend vaccines for you based on your age, medical history, and lifestyle or other factors, such as travel or where you work.   What tests do I need? Blood tests  Lipid and cholesterol levels. These may be checked every 5 years, or more often if you are over 3 years old.  Hepatitis C test.  Hepatitis B test. Screening  Lung cancer screening. You may have this screening every year starting at age 73 if you have a 30-pack-year history of smoking and currently smoke or have quit within the past 15 years.  Colorectal cancer  screening. ? All adults should have this screening starting at age 52 and continuing until age 17. ? Your health care provider may recommend screening at age 49 if you are at increased risk. ? You will have tests every 1-10 years, depending on your results and the type of screening test.  Diabetes screening. ? This is done by checking your blood sugar (glucose) after you have not eaten for a while (fasting). ? You may have this done every 1-3 years.  Mammogram. ? This may be done every 1-2 years. ? Talk with your health care provider about when you should start having regular mammograms. This may depend on whether you have a family history of breast cancer.  BRCA-related cancer screening. This may be done if you have a family history of breast, ovarian, tubal, or peritoneal cancers.  Pelvic exam and Pap test. ? This may be done every 3 years starting at age 10. ? Starting at age 11, this may be done every 5 years if you have a Pap test in combination with an HPV test. Other tests  STD (sexually transmitted disease) testing, if you are at risk.  Bone density scan. This is done to screen for osteoporosis. You may have this scan if you are at high risk for osteoporosis. Talk with your health care provider about your test results, treatment options, and if necessary, the need for more tests. Follow these instructions at home: Eating and drinking  Eat a diet that includes fresh fruits and vegetables, whole grains, lean protein, and low-fat dairy products.  Take vitamin and mineral supplements  as recommended by your health care provider.  Do not drink alcohol if: ? Your health care provider tells you not to drink. ? You are pregnant, may be pregnant, or are planning to become pregnant.  If you drink alcohol: ? Limit how much you have to 0-1 drink a day. ? Be aware of how much alcohol is in your drink. In the [place] one drink equals one 12 oz bottle of beer (355 mL), one 5 oz glass of  wine (148 mL), or one 1 oz glass of hard liquor (44 mL).   Lifestyle  Take daily care of your teeth and gums. Brush your teeth every morning and night with fluoride toothpaste. Floss one time each day.  Stay active. Exercise for at least 30 minutes 5 or more days each week.  Do not use any products that contain nicotine or tobacco, such as cigarettes, e-cigarettes, and chewing tobacco. If you need help quitting, ask your health care provider.  Do not use drugs.  If you are sexually active, practice safe sex. Use a condom or other form of protection to prevent STIs (sexually transmitted infections).  If you do not wish to become pregnant, use a form of birth control. If you plan to become pregnant, see your health care provider for a prepregnancy visit.  If told by your health care provider, take low-dose aspirin daily starting at age 50.  Find healthy ways to cope with stress, such as: ? Meditation, yoga, or listening to music. ? Journaling. ? Talking to a trusted person. ? Spending time with friends and family. Safety  Always wear your seat belt while driving or riding in a vehicle.  Do not drive: ? If you have been drinking alcohol. Do not ride with someone who has been drinking. ? When you are tired or distracted. ? While texting.  Wear a helmet and other protective equipment during sports activities.  If you have firearms in your house, make sure you follow all gun safety procedures. What's next?  Visit your health care provider once a year for an annual wellness visit.  Ask your health care provider how often you should have your eyes and teeth checked.  Stay up to date on all vaccines. This information is not intended to replace advice given to you by your health care provider. Make sure you discuss any questions you have with your health care provider. Document Revised: 08/02/2020 Document Reviewed: 07/10/2018 Elsevier Patient Education  2021 [employer]  

## 2021-01-27 LAB — COMPLETE METABOLIC PANEL WITH GFR
AG Ratio: 1.7 (calc) (ref 1.0–2.5)
ALT: 16 U/L (ref 6–29)
AST: 17 U/L (ref 10–35)
Albumin: 4.4 g/dL (ref 3.6–5.1)
Alkaline phosphatase (APISO): 45 U/L (ref 37–153)
BUN/Creatinine Ratio: 15 (calc) (ref 6–22)
BUN: 17 mg/dL (ref 7–25)
CO2: 29 mmol/L (ref 20–32)
Calcium: 9.7 mg/dL (ref 8.6–10.4)
Chloride: 103 mmol/L (ref 98–110)
Creat: 1.15 mg/dL — ABNORMAL HIGH (ref 0.50–0.99)
GFR, Est African American: 59 mL/min/{1.73_m2} — ABNORMAL LOW (ref >=60)
GFR, Est Non African American: 51 mL/min/{1.73_m2} — ABNORMAL LOW (ref >=60)
Globulin: 2.6 g/dL (calc) (ref 1.9–3.7)
Glucose, Bld: 92 mg/dL (ref 65–99)
Potassium: 4.1 mmol/L (ref 3.5–5.3)
Sodium: 141 mmol/L (ref 135–146)
Total Bilirubin: 0.5 mg/dL (ref 0.2–1.2)
Total Protein: 7 g/dL (ref 6.1–8.1)

## 2021-01-27 LAB — H. PYLORI BREATH TEST: H. pylori Breath Test: DETECTED — AB

## 2021-01-27 LAB — TSH: TSH: 2.34 mIU/L (ref 0.40–4.50)

## 2021-01-27 LAB — CBC WITH DIFFERENTIAL/PLATELET
Absolute Monocytes: 338 cells/uL (ref 200–950)
Basophils Absolute: 42 cells/uL (ref 0–200)
Basophils Relative: 1.1 %
Eosinophils Absolute: 61 cells/uL (ref 15–500)
Eosinophils Relative: 1.6 %
HCT: 43.5 % (ref 35.0–45.0)
Hemoglobin: 14 g/dL (ref 11.7–15.5)
Lymphs Abs: 1820 cells/uL (ref 850–3900)
MCH: 27 pg (ref 27.0–33.0)
MCHC: 32.2 g/dL (ref 32.0–36.0)
MCV: 83.8 fL (ref 80.0–100.0)
MPV: 9.5 fL (ref 7.5–12.5)
Monocytes Relative: 8.9 %
Neutro Abs: 1539 cells/uL (ref 1500–7800)
Neutrophils Relative %: 40.5 %
Platelets: 393 10*3/uL (ref 140–400)
RBC: 5.19 10*6/uL — ABNORMAL HIGH (ref 3.80–5.10)
RDW: 14.1 % (ref 11.0–15.0)
Total Lymphocyte: 47.9 %
WBC: 3.8 10*3/uL (ref 3.8–10.8)

## 2021-01-27 LAB — CYTOLOGY - PAP
Adequacy: ABSENT
Comment: NEGATIVE
Diagnosis: NEGATIVE
High risk HPV: NEGATIVE

## 2021-01-27 LAB — LIPID PANEL
Cholesterol: 186 mg/dL (ref <200)
HDL: 55 mg/dL (ref >=50)
LDL Cholesterol (Calc): 116 mg/dL (calc) — ABNORMAL HIGH
Non-HDL Cholesterol (Calc): 131 mg/dL (calc) — ABNORMAL HIGH (ref <130)
Total CHOL/HDL Ratio: 3.4 (calc) (ref <5.0)
Triglycerides: 63 mg/dL (ref <150)

## 2021-01-27 LAB — VITAMIN D 25 HYDROXY (VIT D DEFICIENCY, FRACTURES): Vit D, 25-Hydroxy: 57 ng/mL (ref 30–100)

## 2021-01-30 ENCOUNTER — Other Ambulatory Visit: Payer: Self-pay | Admitting: Family Medicine

## 2021-01-30 DIAGNOSIS — A048 Other specified bacterial intestinal infections: Secondary | ICD-10-CM

## 2021-01-30 MED ORDER — LEVOFLOXACIN 500 MG PO TABS: 500.0000 mg | ORAL_TABLET | Freq: Every day | ORAL | 0 refills | Status: DC

## 2021-01-30 MED ORDER — OMEPRAZOLE 20 MG PO CPDR
20.0000 mg | DELAYED_RELEASE_CAPSULE | Freq: Two times a day (BID) | ORAL | 0 refills | Status: DC
Start: 2021-01-30 — End: 2021-02-27

## 2021-01-30 MED ORDER — AMOXICILLIN 500 MG PO TABS: 1000.0000 mg | ORAL_TABLET | Freq: Two times a day (BID) | ORAL | 0 refills | Status: DC

## 2021-02-03 ENCOUNTER — Telehealth: Payer: Self-pay

## 2021-02-03 NOTE — Telephone Encounter (Signed)
Copied from [place] [phone]. Topic: General - Other >> Feb 03, 2021  1:54 PM Mcneil, Ja-Kwan wrote: Reason for CRM: Pt stated she is scheduled to have an ultrasound on her stomach and seh would like to ask Dr. Ancil Boozer if she should stop taking any of her medications prior to the ultrasound being done. Pt requests call back.

## 2021-02-06 ENCOUNTER — Other Ambulatory Visit: Payer: Self-pay | Admitting: Family Medicine

## 2021-02-06 DIAGNOSIS — R1011 Right upper quadrant pain: Secondary | ICD-10-CM

## 2021-02-06 DIAGNOSIS — R222 Localized swelling, mass and lump, trunk: Secondary | ICD-10-CM

## 2021-02-07 ENCOUNTER — Ambulatory Visit
Admission: RE | Admit: 2021-02-07 | Discharge: 2021-02-07 | Disposition: A | Discharge status: Home / Self Care | Payer: BC Managed Care – PPO | Source: Ambulatory Visit | Attending: Family Medicine | Admitting: Family Medicine

## 2021-02-07 ENCOUNTER — Other Ambulatory Visit: Payer: Self-pay

## 2021-02-07 DIAGNOSIS — C50211 Malignant neoplasm of upper-inner quadrant of right female breast: Secondary | ICD-10-CM | POA: Diagnosis not present

## 2021-02-07 DIAGNOSIS — N6310 Unspecified lump in the right breast, unspecified quadrant: Secondary | ICD-10-CM | POA: Diagnosis not present

## 2021-02-07 DIAGNOSIS — R928 Other abnormal and inconclusive findings on diagnostic imaging of breast: Secondary | ICD-10-CM | POA: Insufficient documentation

## 2021-02-07 HISTORY — PX: BREAST BIOPSY: SHX20

## 2021-02-08 ENCOUNTER — Ambulatory Visit
Admission: RE | Admit: 2021-02-08 | Discharge: 2021-02-08 | Disposition: A | Discharge status: Home / Self Care | Payer: BC Managed Care – PPO | Source: Ambulatory Visit | Attending: Family Medicine | Admitting: Family Medicine

## 2021-02-08 ENCOUNTER — Other Ambulatory Visit: Payer: Self-pay

## 2021-02-08 ENCOUNTER — Encounter: Payer: Self-pay | Admitting: *Deleted

## 2021-02-08 ENCOUNTER — Telehealth: Payer: Self-pay

## 2021-02-08 DIAGNOSIS — R1011 Right upper quadrant pain: Secondary | ICD-10-CM | POA: Insufficient documentation

## 2021-02-08 DIAGNOSIS — R222 Localized swelling, mass and lump, trunk: Secondary | ICD-10-CM | POA: Diagnosis not present

## 2021-02-08 DIAGNOSIS — C50411 Malignant neoplasm of upper-outer quadrant of right female breast: Secondary | ICD-10-CM

## 2021-02-08 DIAGNOSIS — K760 Fatty (change of) liver, not elsewhere classified: Secondary | ICD-10-CM | POA: Diagnosis not present

## 2021-02-08 NOTE — Telephone Encounter (Signed)
Copied from [place] [phone]. Topic: Quick Communication - See Telephone Encounter >> Feb 08, 2021  4:09 PM Loma [place] wrote: CRM for notification. See Telephone encounter for: 02/08/21. Pt wants a CB  re her biopsy results CB [phone]

## 2021-02-08 NOTE — Progress Notes (Signed)
Notified by Electa Sniff, RN that patient had been notified of her biopsy results of invasive mammary carcinoma of the right breast.  Called patient to establish navigation services.  Patient would like to talk to her PCP,  Dr. Ancil Boozer prior to deciding on a surgeon.  She thinks she saw Dr. Bary Castilla in the past for breast biopsy years ago.  She is going to call me back after her discussion.  She is aware that if I have not heard from her by Friday that I will call her back.  She is agreeable.

## 2021-02-10 ENCOUNTER — Encounter: Payer: Self-pay | Admitting: *Deleted

## 2021-02-10 NOTE — Progress Notes (Signed)
Called patient to follow up on our conversation earlier this week in regards to a surgical consult for new diagnosis of breast cancer.  Patient states she is scheduled to see Dr. Bary Castilla on Monday.  States she wants to take things one day at a time.   Dr. [place] office will make referral for medical oncology.  Will take educational material to his office on Monday to give to patient.

## 2021-02-13 ENCOUNTER — Other Ambulatory Visit: Payer: Self-pay | Admitting: *Deleted

## 2021-02-13 DIAGNOSIS — C50911 Malignant neoplasm of unspecified site of right female breast: Secondary | ICD-10-CM | POA: Diagnosis not present

## 2021-02-13 LAB — SURGICAL PATHOLOGY

## 2021-02-20 ENCOUNTER — Encounter: Payer: Self-pay | Admitting: Oncology

## 2021-02-20 ENCOUNTER — Inpatient Hospital Stay: Payer: BC Managed Care – PPO

## 2021-02-20 ENCOUNTER — Inpatient Hospital Stay: Payer: BC Managed Care – PPO | Attending: Oncology | Admitting: Oncology

## 2021-02-20 VITALS — BP 122/84 | HR 84 | Temp 98.0°F | Resp 18 | Wt 214.7 lb

## 2021-02-20 DIAGNOSIS — G4733 Obstructive sleep apnea (adult) (pediatric): Secondary | ICD-10-CM

## 2021-02-20 DIAGNOSIS — Z7982 Long term (current) use of aspirin: Secondary | ICD-10-CM | POA: Diagnosis not present

## 2021-02-20 DIAGNOSIS — Z170 Estrogen receptor positive status [ER+]: Secondary | ICD-10-CM | POA: Insufficient documentation

## 2021-02-20 DIAGNOSIS — Z79899 Other long term (current) drug therapy: Secondary | ICD-10-CM | POA: Insufficient documentation

## 2021-02-20 DIAGNOSIS — C50211 Malignant neoplasm of upper-inner quadrant of right female breast: Secondary | ICD-10-CM | POA: Insufficient documentation

## 2021-02-20 DIAGNOSIS — C50911 Malignant neoplasm of unspecified site of right female breast: Secondary | ICD-10-CM

## 2021-02-20 DIAGNOSIS — I10 Essential (primary) hypertension: Secondary | ICD-10-CM | POA: Diagnosis not present

## 2021-02-20 DIAGNOSIS — E039 Hypothyroidism, unspecified: Secondary | ICD-10-CM

## 2021-02-20 DIAGNOSIS — Z7984 Long term (current) use of oral hypoglycemic drugs: Secondary | ICD-10-CM | POA: Diagnosis not present

## 2021-02-20 DIAGNOSIS — Z8249 Family history of ischemic heart disease and other diseases of the circulatory system: Secondary | ICD-10-CM | POA: Insufficient documentation

## 2021-02-20 DIAGNOSIS — Z7189 Other specified counseling: Secondary | ICD-10-CM

## 2021-02-20 DIAGNOSIS — Z833 Family history of diabetes mellitus: Secondary | ICD-10-CM | POA: Diagnosis not present

## 2021-02-20 DIAGNOSIS — E119 Type 2 diabetes mellitus without complications: Secondary | ICD-10-CM | POA: Insufficient documentation

## 2021-02-20 DIAGNOSIS — Z803 Family history of malignant neoplasm of breast: Secondary | ICD-10-CM

## 2021-02-20 NOTE — Progress Notes (Signed)
Hematology/Oncology Consult note [facility] Telephone:([phone] [phone]   Patient Care Team: Steele Sizer, MD as PCP - General [name], MD as PCP - Cardiology (Cardiology)  REFERRING PROVIDER: Bary Castilla Forest Gleason, MD  CHIEF COMPLAINTS/REASON FOR VISIT:  Evaluation of breast cancer.   HISTORY OF PRESENTING ILLNESS:   Andrea Randolph is a  65 y.o.  female with PMH listed below was seen in consultation at the request of  Byrnett, Forest Gleason, MD  for evaluation of breast cancer.   01/12/21 screening mammogram showed focal asymmetry with possible distortion.  01/26/21 right diagnostic mammogram and US showed 3x5x106m hypoechoic mass, s/p biopsy on 02/07/21 Pathology showed invasive mammary carcinoma, grade 2, ER90% PR 90%, HER2 negative.   Patient was seen by Dr.Byrnett yesterday. She presents to establish care with oncology Accompanied by her daughter  Menarche 89years with Age at first childbirth 89  OCP use for about 20 years Denies any estrogen replacement. LMP in 522s Denies any previous chest radiation. History of left breast biopsy previously.  Family history Father for maternal aunt who was diagnosed of breast cancer at age of 566  Review of Systems  Constitutional: Negative for appetite change, chills, fatigue and fever.  HENT:   Negative for hearing loss and voice change.   Eyes: Negative for eye problems.  Respiratory: Negative for chest tightness and cough.   Cardiovascular: Negative for chest pain.  Gastrointestinal: Negative for abdominal distention, abdominal pain and blood in stool.  Endocrine: Negative for hot flashes.  Genitourinary: Negative for difficulty urinating and frequency.   Musculoskeletal: Negative for arthralgias.  Skin: Negative for itching and rash.  Neurological: Negative for extremity weakness.  Hematological: Negative for adenopathy.  Psychiatric/Behavioral: Negative for confusion. The patient is  nervous/anxious.     MEDICAL HISTORY:  Past Medical History:  Diagnosis Date  . Allergy   . Anemia   . Arthritis of right shoulder region    Dr. STamala Julian . Chronic insomnia   . Diabetes mellitus without complication ([place]   . Goiter    Dr. SGabriel Carina . Hyperlipidemia   . Hypertension   . Hypertensive retinopathy of left eye   . Hypothyroidism, adult    Dr. SGabriel Carina . Inflammation of urethra   . Menopause   . Microalbuminuria    DM  . Mixed incontinence   . Muscle cramps   . Obesity   . OSA (obstructive sleep apnea)   . Radiculitis, lumbosacral   . Vitamin D deficiency     SURGICAL HISTORY: Past Surgical History:  Procedure Laterality Date  . BREAST BIOPSY Right 90's   benign core needle   . BREAST BIOPSY Right 02/07/2021   [place], Q-clip, path pending   . COLONOSCOPY WITH PROPOFOL N/A 01/26/2019   Procedure: COLONOSCOPY WITH PROPOFOL;  Surgeon: VLin Landsman MD;  [facility];  Service: Gastroenterology;  Laterality: N/A;  . FOOT SURGERY    . TOTAL SHOULDER REPLACEMENT Right 01/25/2015   Dr. PRoland Rack . TOTAL THYROIDECTOMY  08/01/2018    SOCIAL HISTORY: Social History   Socioeconomic History  . Marital status: Legally Separated    Spouse name: Not on file  . Number of children: 1  . Years of education: Not on file  . Highest education level: 12th grade  Occupational History  . Occupation: aCytogeneticist Tobacco Use  . Smoking status: Never Smoker  . Smokeless tobacco: Never Used  Vaping Use  . Vaping Use:  Never used  Substance and Sexual Activity  . Alcohol use: No    Alcohol/week: 0.0 standard drinks  . Drug use: No  . Sexual activity: Not Currently    Birth control/protection: Post-menopausal  Other Topics Concern  . Not on file  Social History Narrative   Separated in around 2009.   She was living with her daughter, however currently moved in with her sister and nephew , but is looking for a house.    Social Determinants of  Health   Financial Resource Strain: Low Risk   . Difficulty of Paying Living Expenses: Not hard at all  Food Insecurity: No Food Insecurity  . Worried About Charity fundraiser in the Last Year: Never true  . Ran Out of Food in the Last Year: Never true  Transportation Needs: No Transportation Needs  . Lack of Transportation (Medical): No  . Lack of Transportation (Non-Medical): No  Physical Activity: Sufficiently Active  . Days of Exercise per Week: 3 days  . Minutes of Exercise per Session: 60 min  Stress: No Stress Concern Present  . Feeling of Stress : Only a little  Social Connections: Moderately Integrated  . Frequency of Communication with Friends and Family: More than three times a week  . Frequency of Social Gatherings with Friends and Family: More than three times a week  . Attends Religious Services: More than 4 times per year  . Active Member of Clubs or Organizations: Yes  . Attends Archivist Meetings: More than 4 times per year  . Marital Status: Separated  Intimate Partner Violence: Not At Risk  . Fear of Current or Ex-Partner: No  . Emotionally Abused: No  . Physically Abused: No  . Sexually Abused: No    FAMILY HISTORY: Family History  Problem Relation Age of Onset  . Heart disease Mother   . Heart disease Father   . Kidney disease Sister   . Hypertension Sister   . Diabetes Sister   . Hypertension Brother   . CVA Brother   . Breast cancer Maternal Aunt 29  . Hypertension Sister   . Kidney failure Sister   . Ovarian cancer Neg Hx   . Colon cancer Neg Hx     ALLERGIES:  is allergic to rosuvastatin.  MEDICATIONS:  Current Outpatient Medications  Medication Sig Dispense Refill  . aspirin 81 MG tablet Take by mouth.    . Cholecalciferol (VITAMIN D3) 1000 units CAPS Take 2 capsules by mouth daily.    . dapagliflozin propanediol (FARXIGA) 10 MG TABS tablet Take 1 tablet (10 mg total) by mouth daily before breakfast. In place of Xigduo 90  tablet 0  . ezetimibe (ZETIA) 10 MG tablet TAKE 1 TABLET DAILY 90 tablet 1  . glucose blood test strip FREESTYLE LITE TEST (In Vitro Strip)  use as directed for 0 days  Quantity: 200.00;  Refills: 3   Ordered :25-Nov-2014  Vonna Kotyk ;  Started 30-Dec-2008 Active Comments: DX: 250.00    . ketoconazole (NIZORAL) 2 % cream Apply 1 application topically daily. 60 g 0  . levofloxacin (LEVAQUIN) 500 MG tablet Take 1 tablet (500 mg total) by mouth daily. 10 tablet 0  . levothyroxine (SYNTHROID) 125 MCG tablet Take 1 tablet (125 mcg total) by mouth daily. 90 tablet 1  . pravastatin (PRAVACHOL) 20 MG tablet TAKE 1 TABLET BY MOUTH ONCE DAILY IN THE EVENING 30 tablet 5  . Semaglutide (RYBELSUS) 7 MG TABS Take 1 tablet by mouth daily.  90 tablet 1  . amoxicillin (AMOXIL) 500 MG tablet Take 2 tablets (1,000 mg total) by mouth 2 (two) times daily. (Patient not taking: Reported on 02/20/2021) 40 tablet 0  . diclofenac Sodium (VOLTAREN) 1 % GEL Apply 2 g topically 4 (four) times daily. (Patient not taking: Reported on 02/20/2021) 300 g 0  . Multiple Vitamin (MULTIVITAMIN) tablet Take 1 tablet by mouth daily. (Patient not taking: Reported on 02/20/2021)    . Olmesartan-amLODIPine-HCTZ 40-5-25 MG TABS TAKE 1 TABLET DAILY (SCHEDULE APPOINTMENT FOR FURTHER REFILLS) (Patient not taking: Reported on 02/20/2021) 90 tablet 3  . omeprazole (PRILOSEC) 20 MG capsule Take 1 capsule (20 mg total) by mouth in the morning and at bedtime. (Patient not taking: Reported on 02/20/2021) 20 capsule 0   No current facility-administered medications for this visit.     PHYSICAL EXAMINATION: ECOG PERFORMANCE STATUS: 0 - Asymptomatic Vitals:   02/20/21 1459  BP: 122/84  Pulse: 84  Resp: 18  Temp: 98 F (36.7 C)  SpO2: 96%   Filed Weights   02/20/21 1459  Weight: 214 lb 11.2 oz (97.4 kg)    Physical Exam Constitutional:      General: She is not in acute distress. HENT:     Head: Normocephalic and atraumatic.   Eyes:     General: No scleral icterus. Cardiovascular:     Rate and Rhythm: Normal rate and regular rhythm.     Heart sounds: Normal heart sounds.  Pulmonary:     Effort: Pulmonary effort is normal. No respiratory distress.     Breath sounds: No wheezing.  Abdominal:     General: Bowel sounds are normal. There is no distension.     Palpations: Abdomen is soft.  Musculoskeletal:        General: No deformity. Normal range of motion.     Cervical back: Normal range of motion and neck supple.  Skin:    General: Skin is warm and dry.     Findings: No erythema or rash.  Neurological:     Mental Status: She is alert and oriented to person, place, and time. Mental status is at baseline.     Cranial Nerves: No cranial nerve deficit.     Coordination: Coordination normal.  Psychiatric:        Mood and Affect: Mood normal.   Breast exam was performed in seated and lying down position. Patient is right breast biopsy.  No palpable discrete mass bilaterally.  No palpable axillary lymphadenopathy bilaterally.   LABORATORY DATA:  I have reviewed the data as listed Lab Results  Component Value Date   WBC 3.8 01/26/2021   HGB 14.0 01/26/2021   HCT 43.5 01/26/2021   MCV 83.8 01/26/2021   PLT 393 01/26/2021   Recent Labs    06/20/20 0941 10/20/20 1045 01/26/21 0949  NA 140 143 141  K 3.7 4.0 4.1  CL 103 105 103  CO2 28 30 29   GLUCOSE 89 85 92  BUN 16 19 17   CREATININE 1.13* 1.04* 1.15*  CALCIUM 9.3 9.8 9.7  GFRNONAA 52* 57* 51*  GFRAA 60 66 59*  PROT 6.8 7.1 7.0  AST 18 17 17   ALT 18 16 16   BILITOT 0.4 0.4 0.5   Iron/TIBC/Ferritin/ %Sat    Component Value Date/Time   FERRITIN 27 08/29/2015 1135      RADIOGRAPHIC STUDIES: I have personally reviewed the radiological images as listed and agreed with the findings in the report. US Abdomen Limited  Result Date: 02/08/2021  CLINICAL DATA:  Subcutaneous nodule of the right anterior abdominal wall. EXAM: ULTRASOUND ABDOMEN  LIMITED COMPARISON:  None. FINDINGS: The patient's palpable area of concern appears to correspond to a 1.8 x 0.5 x 1.8 cm nodule that is isoechoic to the surrounding subcutaneous fat and located within the deep subcutaneous soft tissues. There is minimal internal color Doppler flow. IMPRESSION: The patient's palpable area of concern appears to correspond to a 1.8 cm lipoma. If there is evidence for interval growth, follow-up ultrasound is recommended. Electronically Signed   By: Constance Holster M.D.   On: 02/08/2021 14:12   US BREAST LTD UNI RIGHT INC AXILLA  Result Date: 01/26/2021 CLINICAL DATA:  65 year old female presenting as a recall from screening for possible right breast mass. EXAM: DIGITAL DIAGNOSTIC UNILATERAL RIGHT MAMMOGRAM WITH TOMOSYNTHESIS AND CAD; ULTRASOUND RIGHT BREAST LIMITED TECHNIQUE: Right digital diagnostic mammography and breast tomosynthesis was performed. The images were evaluated with computer-aided detection.; Targeted ultrasound examination of the right breast was performed COMPARISON:  Previous exam(s). ACR Breast Density Category b: There are scattered areas of fibroglandular density. FINDINGS: Mammogram: Spot compression tomosynthesis views of the right breast were performed demonstrating persistence of a small irregular mass with spiculation in the upper slightly inner right breast. There are a few small calcifications associated with the mass. The mass measures 5 mm. Ultrasound: Targeted ultrasound performed in the right breast at 1 o'clock 10 cm from the nipple demonstrating an irregular hypoechoic mass measuring 3 x 5 x 4 mm. This corresponds to the mass identified mammographically. Targeted ultrasound of the right axilla demonstrates normal lymph nodes. IMPRESSION: Suspicious small mass measuring 5 mm in the right breast at 1 o'clock. RECOMMENDATION: Ultrasound-guided core needle biopsy of the right breast mass at 1 o'clock. I have discussed the findings and  recommendations with the patient who agrees to proceed with biopsy. The patient will be contacted by our scheduler to set up the biopsy appointment. BI-RADS CATEGORY  4: Suspicious. Electronically Signed   By: Audie Pinto M.D.   On: 01/26/2021 12:23   MM DIAG BREAST TOMO UNI RIGHT  Result Date: 01/26/2021 CLINICAL DATA:  65 year old female presenting as a recall from screening for possible right breast mass. EXAM: DIGITAL DIAGNOSTIC UNILATERAL RIGHT MAMMOGRAM WITH TOMOSYNTHESIS AND CAD; ULTRASOUND RIGHT BREAST LIMITED TECHNIQUE: Right digital diagnostic mammography and breast tomosynthesis was performed. The images were evaluated with computer-aided detection.; Targeted ultrasound examination of the right breast was performed COMPARISON:  Previous exam(s). ACR Breast Density Category b: There are scattered areas of fibroglandular density. FINDINGS: Mammogram: Spot compression tomosynthesis views of the right breast were performed demonstrating persistence of a small irregular mass with spiculation in the upper slightly inner right breast. There are a few small calcifications associated with the mass. The mass measures 5 mm. Ultrasound: Targeted ultrasound performed in the right breast at 1 o'clock 10 cm from the nipple demonstrating an irregular hypoechoic mass measuring 3 x 5 x 4 mm. This corresponds to the mass identified mammographically. Targeted ultrasound of the right axilla demonstrates normal lymph nodes. IMPRESSION: Suspicious small mass measuring 5 mm in the right breast at 1 o'clock. RECOMMENDATION: Ultrasound-guided core needle biopsy of the right breast mass at 1 o'clock. I have discussed the findings and recommendations with the patient who agrees to proceed with biopsy. The patient will be contacted by our scheduler to set up the biopsy appointment. BI-RADS CATEGORY  4: Suspicious. Electronically Signed   By: Audie Pinto M.D.   On: 01/26/2021 12:23  MM CLIP PLACEMENT RIGHT  Result  Date: 02/07/2021 CLINICAL DATA:  Procedure mammogram for clip placement. EXAM: DIAGNOSTIC RIGHT MAMMOGRAM POST ULTRASOUND BIOPSY COMPARISON:  Previous exam(s). FINDINGS: Mammographic images were obtained following ultrasound guided biopsy of a right breast mass at 1 o'clock. The biopsy marking clip is in expected position at the site of biopsy. IMPRESSION: Appropriate positioning of the Q shaped biopsy marking clip at the site of biopsy in the right breast at 1 o'clock. Final Assessment: Post Procedure Mammograms for Marker Placement Electronically Signed   By: Audie Pinto M.D.   On: 02/07/2021 09:19   [place] RT BREAST BX W LOC DEV 1ST LESION IMG BX SPEC US GUIDE  Addendum Date: 02/08/2021   ADDENDUM REPORT: 02/08/2021 13:09 ADDENDUM: PATHOLOGY revealed: A. RIGHT BREAST, 1:00 / 10 CMFN; ULTRASOUND-GUIDED BIOPSY: - INVASIVE MAMMARY CARCINOMA, NO SPECIAL TYPE. Size of invasive carcinoma: 5 mm in this sample. Grade 2. Ductal carcinoma in situ: Present, intermediate grade. Lymphovascular invasion: Not identified. Comment: The definitive grade will be assigned on the excisional specimen. Pathology results are CONCORDANT with imaging findings, per Dr. Audie Pinto. Pathology results and recommendations below were discussed with patient by telephone on 02/08/2021. Patient reported biopsy site within normal limits with slight tenderness at the site, and no significant bruising. Post biopsy care instructions were reviewed, questions were answered and my direct phone number was provided to patient. Patient was instructed to call [facility] if any concerns or questions arise related to the biopsy. Recommend surgical consultation: Request for surgical consultation relayed to Al Pimple RN and Tanya Nones RN at [facility] by Electa Sniff RN on 02/08/2021. Pathology results reported by Electa Sniff RN on 02/08/2021. Electronically Signed   By: Audie Pinto M.D.   On: 02/08/2021 13:09    Result Date: 02/08/2021 CLINICAL DATA:  65 year old female presenting for biopsy of a right breast mass. EXAM: ULTRASOUND GUIDED RIGHT BREAST CORE NEEDLE BIOPSY COMPARISON:  Previous exam(s). PROCEDURE: I met with the patient and we discussed the procedure of ultrasound-guided biopsy, including benefits and alternatives. We discussed the high likelihood of a successful procedure. We discussed the risks of the procedure, including infection, bleeding, tissue injury, clip migration, and inadequate sampling. Informed written consent was given. The usual time-out protocol was performed immediately prior to the procedure. Lesion quadrant: Upper inner quadrant Using sterile technique and 1% Lidocaine as local anesthetic, under direct ultrasound visualization, a 14 gauge spring-loaded device was used to perform biopsy of a right breast mass at 1 o'clock using a medial approach. At the conclusion of the procedure a Q tissue marker clip was deployed into the biopsy cavity. Follow up 2 view mammogram was performed and dictated separately. IMPRESSION: Ultrasound guided biopsy of a right breast mass at 1 o'clock. No apparent complications. Electronically Signed: By: Audie Pinto M.D. On: 02/07/2021 09:20   US Abdomen Limited RUQ (LIVER/GB)  Result Date: 02/08/2021 CLINICAL DATA:  Right upper quadrant pain x3 months EXAM: ULTRASOUND ABDOMEN LIMITED RIGHT UPPER QUADRANT COMPARISON:  None recent FINDINGS: Gallbladder: No gallstones or wall thickening visualized. No sonographic Murphy sign noted by sonographer. Common bile duct: Diameter: 3 mm Liver: Diffuse increased echogenicity with slightly heterogeneous liver. Appearance typically secondary to fatty infiltration. Fibrosis secondary consideration. No secondary findings of cirrhosis noted. No focal hepatic lesion or intrahepatic biliary duct dilatation. Portal vein is patent on color Doppler imaging with normal direction of blood flow towards the liver. Other: None.  IMPRESSION: 1. No acute abnormality.  No evidence for cholelithiasis. 2. Hepatic steatosis. Electronically Signed   By: Constance Holster M.D.   On: 02/08/2021 14:13      ASSESSMENT & PLAN:  1. Malignant 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, estrogen receptor positive ([place])   2. Goals of care, counseling/discussion    Image findings and the pathology findings were reviewed and discussed with patient. Diagnosis of stage I right breast cancer was discussed with patient. I recommend lumpectomy with sentinel lymph node biopsy. If final pathology shows the size of the tumor is less than 5 mm been patient will not need to have Oncotype DX checked. She will proceed with radiation adjuvantly followed by antiestrogen treatments.  Advised patient to call Dr. Dwyane Luo office to schedule surgery.  CC breast cancer nurse navigator. Orders Placed This Encounter  Procedures  . Cancer antigen 15-3    Standing Status:   Future    Number of Occurrences:   1    Standing Expiration Date:   02/20/2022  . Cancer antigen 27.29    Standing Status:   Future    Number of Occurrences:   1    Standing Expiration Date:   02/20/2022    All questions were answered. The patient knows to call the clinic with any problems questions or concerns.  cc Byrnett, Forest Gleason, MD    Return of visit: I will see patient 2 weeks after surgery to go over pathology results and adjuvant plan. Thank you for this kind referral and the opportunity to participate in the care of this patient. A copy of today's note is routed to referring provider    Earlie Server, MD, PhD Hematology Oncology [facility] Pager- [phone] 02/20/2021

## 2021-02-21 ENCOUNTER — Other Ambulatory Visit: Payer: Self-pay | Admitting: General Surgery

## 2021-02-21 ENCOUNTER — Encounter: Payer: Self-pay | Admitting: *Deleted

## 2021-02-21 DIAGNOSIS — Z170 Estrogen receptor positive status [ER+]: Secondary | ICD-10-CM

## 2021-02-21 DIAGNOSIS — C50211 Malignant neoplasm of upper-inner quadrant of right female breast: Secondary | ICD-10-CM

## 2021-02-21 LAB — CANCER ANTIGEN 27.29: CA 27.29: 17.1 U/mL (ref 0.0–38.6)

## 2021-02-21 LAB — CANCER ANTIGEN 15-3: CA 15-3: 16.8 U/mL (ref 0.0–25.0)

## 2021-02-21 NOTE — Progress Notes (Signed)
Subjective:     Patient ID: Andrea Randolph is a 65 y.o. female.  HPI  The following portions of the patient's history were reviewed and updated as appropriate.  This an established patient is here today for: office visit. The patient has been referred today by Dr. Ancil Boozer for newly diagnosed right breast cancer. She reports no changes that she was aware of prior to her recent mammogram. The patient states Dr. Bary Castilla did a stereotactic biopsy of her left breast a long time ago. She denies trauma to the breast and states she did not get the Covid vaccine. Patient reports her bra size is a 42 DDD.  I seen the patient in the distant past at which time a stereotactic biopsy had been completed in the right breast in the 1990s.  Patient gets regular mammograms.  She has several friends, acquaintances who had had breast cancer and she came with request to see a particular medical oncologist, Earlie Server, MD and Noreene Filbert, MD from radiation oncology.   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       Chief Complaint  Patient presents with  . Treatment Plan Discussion    newly diagnosed right breast cancer     BP (!) 152/88   Pulse 87   Temp 36.6 C (97.9 F)   Ht 165.1 cm (_0 )   Wt 96.2 kg (212 lb)   SpO2 97%   BMI 35.28 kg/m       Past Medical History:  Diagnosis Date  . Anemia   . Arthritis of right shoulder region   . Chronic insomnia   . Diabetes mellitus type 2, uncomplicated (CMS-HCC)   . Goiter   . H/O vitamin D deficiency   . Hyperlipidemia   . Hypertension   . Hypothyroidism    s/p thyroidectomy 07/2018  . Mixed incontinence   . Obesity   . OSA on CPAP   . Radiculitis, lumbosacral           Past Surgical History:  Procedure Laterality Date  . BREAST EXCISIONAL BIOPSY Right 02/07/2021  . BREAST EXCISIONAL BIOPSY Right    1990's  . COLONOSCOPY  01/26/2019  . foot surgery    . Right total  shoulder arthroplasty using biomet comprehensive system with a press-fit #9 mini humeral stem, a 48 X 21 mm humeral head,and a medium hybrid glenoid with porous titanium post Right 01/25/2015  . THYROIDECTOMY TOTAL  08/01/2018              OB History    Gravida  1   Para  1   Term      Preterm      AB      Living        SAB      IAB      Ectopic      Molar      Multiple      Live Births          Obstetric Comments  Age at first period 25 Age of first pregnancy 35        Social History          Socioeconomic History  . Marital status: Married    Spouse name: Not on file  . Number of children: Not on file  . Years of education: Not on file  . Highest education level: Not on file  Occupational History  . Not on file  Tobacco Use  .  Smoking status: Never Smoker  . Smokeless tobacco: Never Used  Vaping Use  . Vaping Use: Never used  Substance and Sexual Activity  . Alcohol use: No    Alcohol/week: 0.0 standard drinks  . Drug use: No  . Sexual activity: Defer  Other Topics Concern  . Not on file  Social History Narrative  . Not on file   Social Determinants of Health   Financial Resource Strain: Not on file  Food Insecurity: Not on file  Transportation Needs: Not on file           Allergies  Allergen Reactions  . Metformin Nausea  . Rosuvastatin Muscle Pain    Current Medications        Current Outpatient Medications  Medication Sig Dispense Refill  . ammonium lactate (AMLACTIN) 12 % cream Apply topically.    Marland Kitchen aspirin 81 MG EC tablet Take 81 mg by mouth once daily.     . blood glucose diagnostic (FREESTYLE LITE STRIPS) test strip FREESTYLE LITE TEST (In Vitro Strip) use as directed for 0 days Quantity: 200.00;  Refills: 3 Ordered :25-Nov-2014 Vonna Kotyk ;  Started 18-Feb-2010Active Comments: DX: 250.00    . cholecalciferol (CHOLECALCIFEROL) 1,000 unit tablet Take 2,000 Units by mouth once daily    .  dapagliflozin (FARXIGA) 10 mg tablet Take 10 mg by mouth once daily    . ezetimibe (ZETIA) 10 mg tablet Take 10 mg by mouth once daily    . ketoconazole (NIZORAL) 2 % cream Apply topically Apply 1 application topically daily.    Marland Kitchen levothyroxine (SYNTHROID) 125 MCG tablet Take 1 tablet (125 mcg total) by mouth once daily Take on an empty stomach with a glass of water at least 30-60 minutes before breakfast. 90 tablet 3  . multivitamin tablet Take 1 tablet by mouth once daily. Reported on 03/20/2016     . olmesartan-amLODIPine-hydrochlorothiazide (TRIBENZOR) 40-10-25 mg tablet Take 1 tablet by mouth once daily.     . pravastatin (PRAVACHOL) 20 MG tablet pravastatin 20 mg tablet  TAKE 1 TABLET BY MOUTH ONCE DAILY IN THE EVENING    . semaglutide (RYBELSUS) 7 mg Tab Take 7 mg by mouth once daily Do not cut, crush, or chew    . atorvastatin (LIPITOR) 20 MG tablet Take 20 mg by mouth once daily (Patient not taking: Reported on 02/13/2021  )    . empagliflozin-metformin (SYNJARDY XR) 25-1,000 mg TBph TAKE 1 TABLET DAILY (Patient not taking: Reported on 02/13/2021  )    . rosuvastatin (CRESTOR) 5 MG tablet Take 5 mg by mouth once daily.   (Patient not taking: Reported on 02/13/2021  )     No current facility-administered medications for this visit.           Family History  Problem Relation Age of Onset  . High blood pressure (Hypertension) Mother   . Diabetes type II Mother   . Stroke Mother   . Heart disease Mother   . Heart disease Father   . Diabetes Sister   . High blood pressure (Hypertension) Sister   . Kidney disease Sister   . High blood pressure (Hypertension) Brother   . Stroke Brother   . Breast cancer Maternal Aunt   . Colon cancer Maternal Aunt   . Ovarian cancer Neg Hx        Review of Systems  Constitutional: Negative for chills and fever.  Respiratory: Negative for cough.        Objective:   Physical Exam  Exam conducted with  a chaperone present.  Constitutional:      Appearance: Normal appearance.  Cardiovascular:     Rate and Rhythm: Normal rate and regular rhythm.     Pulses: Normal pulses.     Heart sounds: Murmur heard.  Pulmonary:     Effort: Pulmonary effort is normal.     Breath sounds: Normal breath sounds.  Musculoskeletal:     Cervical back: Neck supple.  Skin:    General: Skin is warm and dry.  Neurological:     Mental Status: She is alert and oriented to person, place, and time.  Psychiatric:        Mood and Affect: Mood normal.        Behavior: Behavior normal.    Labs and Radiology:   Pathology review of February 07, 2021:  DIAGNOSIS:  A. RIGHT BREAST, 1:00 10CMFN; ULTRASOUND-GUIDED BIOPSY:  - INVASIVE MAMMARY CARCINOMA, NO SPECIAL TYPE.   Size of invasive carcinoma: 5 mm in this sample  Histologic grade of invasive carcinoma: Grade 2            Glandular/tubular differentiation score: 3            Nuclear pleomorphism score: 2            Mitotic rate score: 2            Total score: 7  Ductal carcinoma in situ: Present, intermediate grade  Lymphovascular invasion: Not identified   Estrogen Receptor (ER) Status: POSITIVE      Percentage of cells with nuclear positivity: Greater than 90%      Average intensity of staining: Strong   Progesterone Receptor (PgR) Status: POSITIVE      Percentage of cells with nuclear positivity: Greater than 90%      Average intensity of staining: Strong   HER2 (by immunohistochemistry): NEGATIVE (Score 0)   Mammogram review:  January 01, 2020 through March 29,, 2022 mammograms and associated ultrasounds were independently reviewed.  Developing density around 8 tiny area with 3 microcalcifications in 2021.  Density measures less than 5 mm.  Ultrasound exam:  Examination of the right breast in the 11-1 o'clock position, 10 cm from the nipple shows the biopsy cavity evident  approximately 1.4 cm below the skin.  Clip is noted.     Assessment:     Newly diagnosed stage I carcinoma of the right breast.    Plan:     Options for management of this early stage breast cancer were reviewed.  Breast conservation and mastectomy were presented is therapeutic equivalents.  Indications for radiation therapy after partial breast resection were reviewed.  With her hormone status being positive she would be a candidate for antiestrogen therapy.  At this time, no indication for adjuvant chemotherapy.  Opportunity for second opinion and preprocedure consultation offered.  She would like to meet with medical oncology and a referral has been placed to Dr. Tasia Catchings.  Greater than 50% of the 50-minute visit was spent reviewing treatment options.  An informational brochure was provided as well as several appropriate websites.  She will notify the office if we can be of assistance or facilitate scheduling.    Patient to call the office and let us know how she would like to proceed.     Entered by Ledell Noss, CMA, acting as a scribe for Dr. Hervey Ard, MD.   The documentation recorded by the scribe accurately reflects the service I personally performed and the decisions made by  me.   Robert Bellow, MD FACS

## 2021-02-21 NOTE — Progress Notes (Signed)
Patient called.  She wanted to know if I could let Dr. Bary Castilla know she is ready to schedule her surgery.  Called and spoke to [place], Dr. Dwyane Luo nurse to let her know.

## 2021-02-27 ENCOUNTER — Encounter
Admission: RE | Admit: 2021-02-27 | Discharge: 2021-02-27 | Disposition: A | Discharge status: Home / Self Care | Payer: BC Managed Care – PPO | Source: Ambulatory Visit | Attending: General Surgery | Admitting: General Surgery

## 2021-02-27 ENCOUNTER — Other Ambulatory Visit: Payer: Self-pay

## 2021-02-27 DIAGNOSIS — Z01810 Encounter for preprocedural cardiovascular examination: Secondary | ICD-10-CM | POA: Diagnosis not present

## 2021-02-27 DIAGNOSIS — E119 Type 2 diabetes mellitus without complications: Secondary | ICD-10-CM | POA: Diagnosis not present

## 2021-02-27 DIAGNOSIS — I10 Essential (primary) hypertension: Secondary | ICD-10-CM | POA: Diagnosis not present

## 2021-02-27 HISTORY — DX: Cardiac murmur, unspecified: R01.1

## 2021-02-27 HISTORY — DX: Gastro-esophageal reflux disease without esophagitis: K21.9

## 2021-02-27 HISTORY — DX: Family history of other specified conditions: Z84.89

## 2021-02-27 NOTE — Patient Instructions (Addendum)
Your procedure is scheduled on:  Monday, April 25 Report to the Registration Desk on the 1st floor of the [facility]. To find out your arrival time, please call [phone] between 1PM - 3PM on: Friday, April 22  REMEMBER: Instructions that are not followed completely may result in serious medical risk, up to and including death; or upon the discretion of your surgeon and anesthesiologist your surgery may need to be rescheduled.  Do not eat food after midnight the night before surgery.  No gum chewing, lozengers or hard candies.  You may however, drink water up to 2 hours before you are scheduled to arrive for your surgery. Do not drink anything within 2 hours of your scheduled arrival time.  TAKE THESE MEDICATIONS THE MORNING OF SURGERY WITH A SIP OF WATER:  1.  Levothyroxine  One week prior to surgery: starting today, April 18 Stop aspirin, Anti-inflammatories (NSAIDS) such as Advil, Aleve, Ibuprofen, Motrin, Naproxen, Naprosyn and Aspirin based products such as Excedrin, Goodys Powder, BC Powder. Stop ANY OVER THE COUNTER supplements until after surgery.  No Alcohol for 24 hours before or after surgery.  No Smoking including e-cigarettes for 24 hours prior to surgery.  No chewable tobacco products for at least 6 hours prior to surgery.  No nicotine patches on the day of surgery.  Do not use any "recreational" drugs for at least a week prior to your surgery.  Please be advised that the combination of cocaine and anesthesia may have negative outcomes, up to and including death. If you test positive for cocaine, your surgery will be cancelled.  On the morning of surgery brush your teeth with toothpaste and water, you may rinse your mouth with mouthwash if you wish. Do not swallow any toothpaste or mouthwash.  Do not wear jewelry, make-up, hairpins, clips or nail polish.  Do not wear lotions, powders, or perfumes.   Do not shave body from the neck down 48 hours prior to  surgery just in case you cut yourself which could leave a site for infection.  Also, freshly shaved skin may become irritated if using the CHG soap.  Contact lenses, hearing aids and dentures may not be worn into surgery.  Do not bring valuables to the hospital. [facility] is not responsible for any missing/lost belongings or valuables.   Use CHG Soap as directed on instruction sheet.  Notify your doctor if there is any change in your medical condition (cold, fever, infection).  Wear comfortable clothing (specific to your surgery type) to the hospital.  Plan for stool softeners for home use; pain medications have a tendency to cause constipation. You can also help prevent constipation by eating foods high in fiber such as fruits and vegetables and drinking plenty of fluids as your diet allows.  After surgery, you can help prevent lung complications by doing breathing exercises.  Take deep breaths and cough every 1-2 hours. Your doctor may order a device called an Incentive Spirometer to help you take deep breaths.  If you are being discharged the day of surgery, you will not be allowed to drive home. You will need a responsible adult (18 years or older) to drive you home and stay with you that night.   If you are taking public transportation, you will need to have a responsible adult (18 years or older) with you. Please confirm with your physician that it is acceptable to use public transportation.   Please call the [facility] Dept. at [phone] if  you have any questions about these instructions.  Surgery Visitation Policy:  Patients undergoing a surgery or procedure may have one family member or support person with them as long as that person is not COVID-19 positive or experiencing its symptoms.  That person may remain in the waiting area during the procedure.

## 2021-03-02 ENCOUNTER — Other Ambulatory Visit: Payer: Self-pay

## 2021-03-02 ENCOUNTER — Other Ambulatory Visit
Admission: RE | Admit: 2021-03-02 | Discharge: 2021-03-02 | Disposition: A | Discharge status: Home / Self Care | Payer: BC Managed Care – PPO | Source: Ambulatory Visit | Attending: General Surgery | Admitting: General Surgery

## 2021-03-02 DIAGNOSIS — Z01812 Encounter for preprocedural laboratory examination: Secondary | ICD-10-CM | POA: Insufficient documentation

## 2021-03-02 DIAGNOSIS — Z20822 Contact with and (suspected) exposure to covid-19: Secondary | ICD-10-CM | POA: Insufficient documentation

## 2021-03-02 LAB — SARS CORONAVIRUS 2 (TAT 6-24 HRS): SARS Coronavirus 2: NEGATIVE

## 2021-03-06 ENCOUNTER — Encounter
Admission: RE | Disposition: A | Discharge status: Home / Self Care | Payer: Self-pay | Source: Home / Self Care | Attending: General Surgery

## 2021-03-06 ENCOUNTER — Encounter: Payer: Self-pay | Admitting: General Surgery

## 2021-03-06 ENCOUNTER — Ambulatory Visit
Admission: RE | Admit: 2021-03-06 | Discharge: 2021-03-06 | Disposition: A | Discharge status: Home / Self Care | Payer: BC Managed Care – PPO | Source: Home / Self Care | Admitting: General Surgery

## 2021-03-06 ENCOUNTER — Ambulatory Visit
Admission: RE | Admit: 2021-03-06 | Discharge: 2021-03-06 | Disposition: A | Discharge status: Home / Self Care | Payer: BC Managed Care – PPO | Attending: General Surgery | Admitting: General Surgery

## 2021-03-06 ENCOUNTER — Ambulatory Visit
Admission: RE | Admit: 2021-03-06 | Discharge: 2021-03-06 | Disposition: A | Discharge status: Home / Self Care | Payer: BC Managed Care – PPO | Source: Ambulatory Visit | Attending: General Surgery | Admitting: General Surgery

## 2021-03-06 ENCOUNTER — Ambulatory Visit: Payer: BC Managed Care – PPO | Admitting: Anesthesiology

## 2021-03-06 DIAGNOSIS — K219 Gastro-esophageal reflux disease without esophagitis: Secondary | ICD-10-CM | POA: Insufficient documentation

## 2021-03-06 DIAGNOSIS — I10 Essential (primary) hypertension: Secondary | ICD-10-CM | POA: Diagnosis not present

## 2021-03-06 DIAGNOSIS — Z170 Estrogen receptor positive status [ER+]: Secondary | ICD-10-CM

## 2021-03-06 DIAGNOSIS — Z79899 Other long term (current) drug therapy: Secondary | ICD-10-CM | POA: Insufficient documentation

## 2021-03-06 DIAGNOSIS — E039 Hypothyroidism, unspecified: Secondary | ICD-10-CM | POA: Diagnosis not present

## 2021-03-06 DIAGNOSIS — Z7982 Long term (current) use of aspirin: Secondary | ICD-10-CM | POA: Diagnosis not present

## 2021-03-06 DIAGNOSIS — I129 Hypertensive chronic kidney disease with stage 1 through stage 4 chronic kidney disease, or unspecified chronic kidney disease: Secondary | ICD-10-CM | POA: Diagnosis not present

## 2021-03-06 DIAGNOSIS — Z888 Allergy status to other drugs, medicaments and biological substances status: Secondary | ICD-10-CM | POA: Diagnosis not present

## 2021-03-06 DIAGNOSIS — E119 Type 2 diabetes mellitus without complications: Secondary | ICD-10-CM | POA: Diagnosis not present

## 2021-03-06 DIAGNOSIS — N181 Chronic kidney disease, stage 1: Secondary | ICD-10-CM | POA: Diagnosis not present

## 2021-03-06 DIAGNOSIS — E559 Vitamin D deficiency, unspecified: Secondary | ICD-10-CM | POA: Diagnosis not present

## 2021-03-06 DIAGNOSIS — C50211 Malignant neoplasm of upper-inner quadrant of right female breast: Secondary | ICD-10-CM

## 2021-03-06 DIAGNOSIS — C50911 Malignant neoplasm of unspecified site of right female breast: Secondary | ICD-10-CM | POA: Diagnosis not present

## 2021-03-06 DIAGNOSIS — R921 Mammographic calcification found on diagnostic imaging of breast: Secondary | ICD-10-CM | POA: Diagnosis not present

## 2021-03-06 HISTORY — PX: BREAST LUMPECTOMY: SHX2

## 2021-03-06 HISTORY — PX: BREAST LUMPECTOMY WITH SENTINEL LYMPH NODE BIOPSY: SHX5597

## 2021-03-06 LAB — GLUCOSE, CAPILLARY
Glucose-Capillary: 112 mg/dL — ABNORMAL HIGH (ref 70–99)
Glucose-Capillary: 119 mg/dL — ABNORMAL HIGH (ref 70–99)

## 2021-03-06 SURGERY — BREAST LUMPECTOMY WITH SENTINEL LYMPH NODE BX
Anesthesia: General | Site: Breast | Laterality: Right

## 2021-03-06 MED ORDER — MIDAZOLAM HCL 2 MG/2ML IJ SOLN
INTRAMUSCULAR | Status: DC | PRN
  Administered 2021-03-06: 2 mg via INTRAVENOUS

## 2021-03-06 MED ORDER — PROPOFOL 10 MG/ML IV BOLUS
INTRAVENOUS | Status: AC
  Filled 2021-03-06: qty 40

## 2021-03-06 MED ORDER — CHLORHEXIDINE GLUCONATE CLOTH 2 % EX PADS: 6.0000 | MEDICATED_PAD | Freq: Once | CUTANEOUS | Status: DC

## 2021-03-06 MED ORDER — EPHEDRINE SULFATE 50 MG/ML IJ SOLN
INTRAMUSCULAR | Status: DC | PRN
  Administered 2021-03-06 (×2): 10 mg via INTRAVENOUS

## 2021-03-06 MED ORDER — LACTATED RINGERS IV SOLN: INTRAVENOUS | Status: DC | PRN

## 2021-03-06 MED ORDER — CHLORHEXIDINE GLUCONATE 0.12 % MT SOLN
OROMUCOSAL | Status: AC
  Administered 2021-03-06: 15 mL via OROMUCOSAL
  Filled 2021-03-06: qty 15

## 2021-03-06 MED ORDER — OXYCODONE HCL 5 MG PO TABS: 5.0000 mg | ORAL_TABLET | Freq: Once | ORAL | Status: DC | PRN

## 2021-03-06 MED ORDER — EPHEDRINE 5 MG/ML INJ
INTRAVENOUS | Status: AC
  Filled 2021-03-06: qty 10

## 2021-03-06 MED ORDER — PROPOFOL 10 MG/ML IV BOLUS
INTRAVENOUS | Status: DC | PRN
  Administered 2021-03-06: 160 mg via INTRAVENOUS

## 2021-03-06 MED ORDER — ORAL CARE MOUTH RINSE: 15.0000 mL | Freq: Once | OROMUCOSAL | Status: AC

## 2021-03-06 MED ORDER — FAMOTIDINE 20 MG PO TABS: 20.0000 mg | ORAL_TABLET | Freq: Once | ORAL | Status: AC

## 2021-03-06 MED ORDER — SODIUM CHLORIDE 0.9 % IV SOLN: INTRAVENOUS | Status: DC

## 2021-03-06 MED ORDER — ACETAMINOPHEN 10 MG/ML IV SOLN: 1000.0000 mg | Freq: Once | INTRAVENOUS | Status: DC | PRN

## 2021-03-06 MED ORDER — METHYLENE BLUE 0.5 % INJ SOLN
INTRAVENOUS | Status: DC | PRN
  Administered 2021-03-06: 5 mL

## 2021-03-06 MED ORDER — METHYLENE BLUE 0.5 % INJ SOLN
INTRAVENOUS | Status: AC
  Filled 2021-03-06: qty 10

## 2021-03-06 MED ORDER — ACETAMINOPHEN 10 MG/ML IV SOLN
INTRAVENOUS | Status: DC | PRN
  Administered 2021-03-06: 1000 mg via INTRAVENOUS

## 2021-03-06 MED ORDER — LIDOCAINE HCL (CARDIAC) PF 100 MG/5ML IV SOSY
PREFILLED_SYRINGE | INTRAVENOUS | Status: DC | PRN
  Administered 2021-03-06: 100 mg via INTRAVENOUS

## 2021-03-06 MED ORDER — ONDANSETRON HCL 4 MG/2ML IJ SOLN: 4.0000 mg | Freq: Once | INTRAMUSCULAR | Status: DC | PRN

## 2021-03-06 MED ORDER — ONDANSETRON HCL 4 MG/2ML IJ SOLN
INTRAMUSCULAR | Status: DC | PRN
  Administered 2021-03-06: 4 mg via INTRAVENOUS

## 2021-03-06 MED ORDER — TECHNETIUM TC 99M TILMANOCEPT KIT
1.0000 | PACK | Freq: Once | INTRAVENOUS | Status: AC | PRN
  Administered 2021-03-06: 1.13 via INTRADERMAL

## 2021-03-06 MED ORDER — CHLORHEXIDINE GLUCONATE 0.12 % MT SOLN: 15.0000 mL | Freq: Once | OROMUCOSAL | Status: AC

## 2021-03-06 MED ORDER — BUPIVACAINE-EPINEPHRINE (PF) 0.5% -1:200000 IJ SOLN
INTRAMUSCULAR | Status: AC
  Filled 2021-03-06: qty 30

## 2021-03-06 MED ORDER — PHENYLEPHRINE HCL (PRESSORS) 10 MG/ML IV SOLN
INTRAVENOUS | Status: DC | PRN
  Administered 2021-03-06: 200 ug via INTRAVENOUS
  Administered 2021-03-06 (×2): 100 ug via INTRAVENOUS
  Administered 2021-03-06 (×2): 200 ug via INTRAVENOUS

## 2021-03-06 MED ORDER — KETAMINE HCL 50 MG/5ML IJ SOSY
PREFILLED_SYRINGE | INTRAMUSCULAR | Status: AC
  Filled 2021-03-06: qty 5

## 2021-03-06 MED ORDER — FENTANYL CITRATE (PF) 100 MCG/2ML IJ SOLN: 25.0000 ug | INTRAMUSCULAR | Status: DC | PRN

## 2021-03-06 MED ORDER — ACETAMINOPHEN 10 MG/ML IV SOLN
INTRAVENOUS | Status: AC
  Filled 2021-03-06: qty 100

## 2021-03-06 MED ORDER — BUPIVACAINE-EPINEPHRINE (PF) 0.5% -1:200000 IJ SOLN
INTRAMUSCULAR | Status: DC | PRN
  Administered 2021-03-06: 30 mL

## 2021-03-06 MED ORDER — MIDAZOLAM HCL 2 MG/2ML IJ SOLN
INTRAMUSCULAR | Status: AC
  Filled 2021-03-06: qty 2

## 2021-03-06 MED ORDER — KETAMINE HCL 10 MG/ML IJ SOLN
INTRAMUSCULAR | Status: DC | PRN
  Administered 2021-03-06: 50 mg via INTRAVENOUS

## 2021-03-06 MED ORDER — HYDROCODONE-ACETAMINOPHEN 5-325 MG PO TABS: 1.0000 | ORAL_TABLET | ORAL | 0 refills | Status: DC | PRN

## 2021-03-06 MED ORDER — OXYCODONE HCL 5 MG/5ML PO SOLN: 5.0000 mg | Freq: Once | ORAL | Status: DC | PRN

## 2021-03-06 MED ORDER — FENTANYL CITRATE (PF) 100 MCG/2ML IJ SOLN
INTRAMUSCULAR | Status: AC
  Filled 2021-03-06: qty 2

## 2021-03-06 MED ORDER — FENTANYL CITRATE (PF) 100 MCG/2ML IJ SOLN
INTRAMUSCULAR | Status: DC | PRN
  Administered 2021-03-06 (×4): 25 ug via INTRAVENOUS

## 2021-03-06 MED ORDER — FAMOTIDINE 20 MG PO TABS
ORAL_TABLET | ORAL | Status: AC
  Administered 2021-03-06: 20 mg via ORAL
  Filled 2021-03-06: qty 1

## 2021-03-06 MED ORDER — ONDANSETRON HCL 4 MG/2ML IJ SOLN
INTRAMUSCULAR | Status: AC
  Filled 2021-03-06: qty 2

## 2021-03-06 SURGICAL SUPPLY — 58 items
APL PRP STRL LF DISP 70% ISPRP (MISCELLANEOUS) ×1
BINDER BREAST LRG (GAUZE/BANDAGES/DRESSINGS) IMPLANT
BINDER BREAST MEDIUM (GAUZE/BANDAGES/DRESSINGS) IMPLANT
BINDER BREAST XLRG (GAUZE/BANDAGES/DRESSINGS) IMPLANT
BINDER BREAST XXLRG (GAUZE/BANDAGES/DRESSINGS) ×2 IMPLANT
BLADE BOVIE TIP EXT 4 (BLADE) ×2 IMPLANT
BLADE SURG 15 STRL SS SAFETY (BLADE) ×4 IMPLANT
BULB RESERV EVAC DRAIN JP 100C (MISCELLANEOUS) IMPLANT
CANISTER SUCT 1200ML W/VALVE (MISCELLANEOUS) ×2 IMPLANT
CHLORAPREP W/TINT 26 (MISCELLANEOUS) ×2 IMPLANT
CNTNR SPEC 2.5X3XGRAD LEK (MISCELLANEOUS)
CONT SPEC 4OZ STER OR WHT (MISCELLANEOUS)
CONT SPEC 4OZ STRL OR WHT (MISCELLANEOUS)
CONTAINER SPEC 2.5X3XGRAD LEK (MISCELLANEOUS) IMPLANT
COVER PROBE FLX POLY STRL (MISCELLANEOUS) ×2 IMPLANT
COVER WAND RF STERILE (DRAPES) ×2 IMPLANT
DEVICE DUBIN SPECIMEN MAMMOGRA (MISCELLANEOUS) ×2 IMPLANT
DRAIN CHANNEL JP 15F RND 16 (MISCELLANEOUS) IMPLANT
DRAPE LAPAROTOMY TRNSV 106X77 (MISCELLANEOUS) ×2 IMPLANT
DRSG GAUZE FLUFF 36X18 (GAUZE/BANDAGES/DRESSINGS) ×4 IMPLANT
DRSG TELFA 3X8 NADH (GAUZE/BANDAGES/DRESSINGS) ×2 IMPLANT
ELECT CAUTERY BLADE TIP 2.5 (TIP) ×2
ELECT REM PT RETURN 9FT ADLT (ELECTROSURGICAL) ×2
ELECTRODE CAUTERY BLDE TIP 2.5 (TIP) ×1 IMPLANT
ELECTRODE REM PT RTRN 9FT ADLT (ELECTROSURGICAL) ×1 IMPLANT
GLOVE SURG ENC MOIS LTX SZ7.5 (GLOVE) ×2 IMPLANT
GLOVE SURG UNDER LTX SZ8 (GLOVE) ×2 IMPLANT
GOWN STRL REUS W/ TWL LRG LVL3 (GOWN DISPOSABLE) ×2 IMPLANT
GOWN STRL REUS W/TWL LRG LVL3 (GOWN DISPOSABLE) ×4
KIT TURNOVER KIT A (KITS) ×2 IMPLANT
LABEL OR SOLS (LABEL) ×2 IMPLANT
MANIFOLD NEPTUNE II (INSTRUMENTS) ×2 IMPLANT
MARGIN MAP 10MM (MISCELLANEOUS) ×2 IMPLANT
NEEDLE HYPO 22GX1.5 SAFETY (NEEDLE) ×2 IMPLANT
NEEDLE HYPO 25X1 1.5 SAFETY (NEEDLE) ×4 IMPLANT
NEEDLE SPNL 20GX3.5 QUINCKE YW (NEEDLE) ×2 IMPLANT
PACK BASIN MINOR ARMC (MISCELLANEOUS) ×2 IMPLANT
RETRACTOR RING XSMALL (MISCELLANEOUS) IMPLANT
RTRCTR WOUND ALEXIS 13CM XS SH (MISCELLANEOUS)
SHEARS FOC LG CVD HARMONIC 17C (MISCELLANEOUS) IMPLANT
SHEARS HARMONIC 9CM CVD (BLADE) IMPLANT
SLEVE PROBE SENORX GAMMA FIND (MISCELLANEOUS) IMPLANT
STRIP CLOSURE SKIN 1/2X4 (GAUZE/BANDAGES/DRESSINGS) ×2 IMPLANT
SUT ETHILON 3-0 FS-10 30 BLK (SUTURE) ×2
SUT SILK 2 0 (SUTURE) ×2
SUT SILK 2-0 18XBRD TIE 12 (SUTURE) ×1 IMPLANT
SUT VIC AB 2-0 CT1 27 (SUTURE) ×4
SUT VIC AB 2-0 CT1 TAPERPNT 27 (SUTURE) ×2 IMPLANT
SUT VIC AB 3-0 SH 27 (SUTURE) ×4
SUT VIC AB 3-0 SH 27X BRD (SUTURE) ×2 IMPLANT
SUT VIC AB 4-0 FS2 27 (SUTURE) ×4 IMPLANT
SUT VICRYL+ 3-0 144IN (SUTURE) ×2 IMPLANT
SUTURE EHLN 3-0 FS-10 30 BLK (SUTURE) ×1 IMPLANT
SWABSTK COMLB BENZOIN TINCTURE (MISCELLANEOUS) ×2 IMPLANT
SYR 10ML LL (SYRINGE) ×2 IMPLANT
SYR BULB IRRIG 60ML STRL (SYRINGE) ×2 IMPLANT
TAPE TRANSPORE STRL 2 31045 (GAUZE/BANDAGES/DRESSINGS) ×2 IMPLANT
WATER STERILE IRR 1000ML POUR (IV SOLUTION) ×2 IMPLANT

## 2021-03-06 NOTE — Anesthesia Procedure Notes (Signed)
Performed by: Gloria Lambertson R, CRNA       

## 2021-03-06 NOTE — Anesthesia Procedure Notes (Signed)
Procedure Name: LMA Insertion Date/Time: 03/06/2021 9:27 AM Performed by: Debe Coder, CRNA Pre-anesthesia Checklist: Patient identified, Emergency Drugs available, Suction available and Patient being monitored Patient Re-evaluated:Patient Re-evaluated prior to induction Oxygen Delivery Method: Circle system utilized Preoxygenation: Pre-oxygenation with 100% oxygen Induction Type: IV induction Ventilation: Mask ventilation without difficulty LMA: LMA inserted LMA Size: 3.5 Number of attempts: 1 Placement Confirmation: positive ETCO2 and breath sounds checked- equal and bilateral Tube secured with: Tape Dental Injury: Teeth and Oropharynx as per pre-operative assessment

## 2021-03-06 NOTE — Transfer of Care (Signed)
Immediate Anesthesia Transfer of Care Note  Patient: Andrea Randolph  Procedure(s) Performed: BREAST LUMPECTOMY WITH SENTINEL LYMPH NODE BX (Right Breast)  Patient Location: PACU  Anesthesia Type:General  Level of Consciousness: drowsy and patient cooperative  Airway & Oxygen Therapy: Patient Spontanous Breathing and Patient connected to face mask oxygen  Post-op Assessment: Report given to RN and Post -op Vital signs reviewed and stable  Post vital signs: Reviewed and stable  Last Vitals:  Vitals Value Taken Time  BP 149/69 03/06/21 1047  Temp 36.6 C 03/06/21 1047  Pulse 102 03/06/21 1057  Resp 16 03/06/21 1057  SpO2 98 % 03/06/21 1057    Last Pain:  Vitals:   03/06/21 1055  TempSrc:   PainSc: 0-No pain         Complications: No complications documented.

## 2021-03-06 NOTE — Discharge Instructions (Addendum)
AMBULATORY SURGERY  DISCHARGE INSTRUCTIONS   1) The drugs that you were given will stay in your system until tomorrow so for the next 24 hours you should not:  A) Drive an automobile B) Make any legal decisions C) Drink any alcoholic beverage   2) You may resume regular meals tomorrow.  Today it is better to start with liquids and gradually work up to solid foods.  You may eat anything you prefer, but it is better to start with liquids, then soup and crackers, and gradually work up to solid foods.   3) Please notify your doctor immediately if you have any unusual bleeding, trouble breathing, redness and pain at the surgery site, drainage, fever, or pain not relieved by medication.    4) Additional Instructions:  TYLENOL 1,000 MG GIVEN AT [place] AT 9:31 AM.        Please contact your physician with any problems or Same Day Surgery at [phone], Monday through Friday 6 am to 4 pm, or [place] at [facility] number at [phone].

## 2021-03-06 NOTE — Anesthesia Postprocedure Evaluation (Signed)
Anesthesia Post Note  Patient: Andrea Randolph  Procedure(s) Performed: BREAST LUMPECTOMY WITH SENTINEL LYMPH NODE BX (Right Breast)  Patient location during evaluation: PACU Anesthesia Type: General Level of consciousness: awake and alert Pain management: pain level controlled Vital Signs Assessment: post-procedure vital signs reviewed and stable Respiratory status: spontaneous breathing, nonlabored ventilation, respiratory function stable and patient connected to nasal cannula oxygen Cardiovascular status: blood pressure returned to baseline and stable Postop Assessment: no apparent nausea or vomiting Anesthetic complications: no   No complications documented.   Last Vitals:  Vitals:   03/06/21 1130 03/06/21 1154  BP: (!) 141/67 133/74  Pulse: 86 83  Resp: 16 16  Temp: (!) 36.3 C   SpO2: 98% 98%    Last Pain:  Vitals:   03/06/21 1154  TempSrc:   PainSc: 5                  Arita Miss

## 2021-03-06 NOTE — OR Nursing (Signed)
Dr. Bary Castilla in to see pt in postop @ 12:01.

## 2021-03-06 NOTE — Op Note (Addendum)
Preoperative diagnosis: Invasive mammary carcinoma right breast.  Desire for breast conservation.  Postoperative diagnosis: Same.  Operative procedure: Right breast wide excision with ultrasound guidance, pectoralis block, sentinel lymph node biopsy.  Operating surgeon: Hervey Ard, MD.  Anesthesia: General by LMA, Marcaine 0.5% with 1: 200,000 units of epinephrine, 30 cc.  Clinical note: This 65 year old woman recently had mammographic change and biopsy showed a 5 mm invasive mammary carcinoma in the right upper inner quadrant.  She desired breast conservation.  She was injected with technetium sulfur colloid earlier this morning.  She is brought to the operating room for planned wide excision.  The patient had SCD stockings for DVT prevention.  She did not have an indication for antibiotic prophylaxis for clean procedure.  Operative note: With the patient under adequate general anesthesia alcohol was applied to the periareolar tissue and upper arm and 4 cc of 0.5% methylene blue was injected in subareolar plexus and 1 cc in the upper arm and inner aspect.  This was done in the event reverse lymph node mapping was required.  The breast neck and axilla was then cleansed with ChloraPrep and draped.  Ultrasound was used to identify the site of the previous biopsy. Images were obtained for the permanent record.  This area was infiltrated with with 10 cc of the above-mentioned local anesthetic.  A pectoralis block was placed under ultrasound guidance. ( Images were obtained for the permanent record.)  The remaining local anesthesia use during this time.  Both injections were well-tolerated.  A curvilinear incision the upper inner quadrant breast was made over the anticipated site of the lesion the skin was incised sharply and the remaining dissection completed with electrocautery.  The block of breast tissue measured approximately 3 cm in diameter and depth.  This was orientated and specimen radiograph  confirmed the clip within the specimen.  Report from pathology showed the closest margin was anterior at 3 mm.  While the breast specimen was being processed attention was turned to the axilla.  The node seeker device was used to identify increased activity in the lower aspect of the axilla.  A small transverse incision was made here and carried down through skin subtendinous tissue.  Hemostasis was with electrocautery and 3-0 Vicryl ties.  There was a 1 hot blue node with counts of 5000 and adjacent to this a cold nonblue node that was about 2 mm larger.  The larger node was sent as the nonsentinel node.  The "hot" node as well as 2 smaller "hot" blue nodes were sent for routine histology as sentinel nodes 1, 2 and 3.  With good hemostasis in the axilla the axillary envelope was closed with interrupted 2-0 Vicryl sutures.  The adipose layer was closed in a similar fashion.  The skin was closed with a running 4-0 Vicryl subcuticular suture.  Benzoin and Steri-Strips were applied.  Attention was turned to closing the wide excision site.  This was done in layers with interrupted 2-0 Vicryl figure-of-eight sutures.  The deep dermal tissue was approximated with 2 simple 2-0 Vicryl sutures and the skin closed with a running 4-0 Vicryl subcuticular suture.  Benzoin, Steri-Strips followed by Telfa, fluff gauze and a compressive wrap were applied.  Patient tolerated the procedure well and was taken to recovery in stable condition.

## 2021-03-06 NOTE — H&P (Signed)
Andrea Randolph [id number] Feb 28, 1956     HPI:  Patient with recently diagnosed invasive mammary carcinoma of the right breast. For wide excision/ SLN biopsy.   Medications Prior to Admission  Medication Sig Dispense Refill Last Dose  . aspirin 81 MG tablet Take 81 mg by mouth daily.   02/27/2021  . Cholecalciferol (VITAMIN D3) 1000 units CAPS Take 1,000 Units by mouth daily.   02/28/2021  . dapagliflozin propanediol (FARXIGA) 10 MG TABS tablet Take 1 tablet (10 mg total) by mouth daily before breakfast. In place of Xigduo 90 tablet 0 03/05/2021  . ezetimibe (ZETIA) 10 MG tablet TAKE 1 TABLET DAILY (Patient taking differently: Take 10 mg by mouth daily.) 90 tablet 1 03/05/2021  . ketoconazole (NIZORAL) 2 % cream Apply 1 application topically daily. (Patient taking differently: Apply 1 application topically daily as needed for irritation.) 60 g 0 Past Month at Unknown time  . levothyroxine (SYNTHROID) 125 MCG tablet Take 1 tablet (125 mcg total) by mouth daily. 90 tablet 1 03/06/2021 at 0600  . Olmesartan-amLODIPine-HCTZ 40-5-25 MG TABS TAKE 1 TABLET DAILY (SCHEDULE APPOINTMENT FOR FURTHER REFILLS) (Patient taking differently: Take 1 tablet by mouth daily.) 90 tablet 3 03/05/2021 at Unknown time  . pravastatin (PRAVACHOL) 20 MG tablet TAKE 1 TABLET BY MOUTH ONCE DAILY IN THE EVENING (Patient taking differently: Take 20 mg by mouth every evening.) 30 tablet 5 03/05/2021 at Unknown time  . Semaglutide (RYBELSUS) 7 MG TABS Take 1 tablet by mouth daily. (Patient taking differently: Take 7 mg by mouth daily.) 90 tablet 1 03/05/2021 at Unknown time  . diclofenac Sodium (VOLTAREN) 1 % GEL Apply 2 g topically 4 (four) times daily. (Patient taking differently: Apply 2 g topically 4 (four) times daily as needed.) 300 g 0   . glucose blood test strip FREESTYLE LITE TEST (In Vitro Strip)  use as directed for 0 days  Quantity: 200.00;  Refills: 3   Ordered :25-Nov-2014  Vonna Kotyk ;  Started 30-Dec-2008 Active  Comments: DX: 250.00      Allergies  Allergen Reactions  . Rosuvastatin Other (See Comments)    Myalgia    Past Medical History:  Diagnosis Date  . Allergy   . Anemia   . Arthritis of right shoulder region    Dr. Tamala Julian  . Chronic insomnia   . Diabetes mellitus without complication ([place])   . Family history of adverse reaction to anesthesia    sister with Nausea  . GERD (gastroesophageal reflux disease)   . Goiter    Dr. Gabriel Carina  . Heart murmur   . Hyperlipidemia   . Hypertension   . Hypertensive retinopathy of left eye   . Hypothyroidism, adult    Dr. Gabriel Carina  . Inflammation of urethra   . Menopause   . Microalbuminuria    DM  . Mixed incontinence   . Muscle cramps   . Obesity   . OSA (obstructive sleep apnea)   . Radiculitis, lumbosacral   . Vitamin D deficiency    Past Surgical History:  Procedure Laterality Date  . BREAST BIOPSY Left 90's   benign core needle   . BREAST BIOPSY Right 02/07/2021   [place] Bx, Q-clip, path pending   . COLONOSCOPY WITH PROPOFOL N/A 01/26/2019   Procedure: COLONOSCOPY WITH PROPOFOL;  Surgeon: Lin Landsman, MD;  [facility]: [facility] END[facility];  Service: Gastroenterology;  Laterality: N/A;  . FOOT SURGERY Bilateral    heel spurs  . JOINT REPLACEMENT    . TOTAL SHOULDER  REPLACEMENT Right 01/25/2015   Dr. Roland Rack  . TOTAL THYROIDECTOMY  08/01/2018   Social History   Socioeconomic History  . Marital status: Legally Separated    Spouse name: Not on file  . Number of children: 1  . Years of education: Not on file  . Highest education level: 12th grade  Occupational History  . Occupation: Cytogeneticist  Tobacco Use  . Smoking status: Never Smoker  . Smokeless tobacco: Never Used  Vaping Use  . Vaping Use: Never used  Substance and Sexual Activity  . Alcohol use: No    Alcohol/week: 0.0 standard drinks  . Drug use: No  . Sexual activity: Not Currently    Birth control/protection: Post-menopausal  Other Topics Concern  . Not on  file  Social History Narrative   Separated in around 2009.   She was living with her daughter, however currently moved in with her sister and nephew , but is looking for a house.    Social Determinants of Health   Financial Resource Strain: Low Risk   . Difficulty of Paying Living Expenses: Not hard at all  Food Insecurity: No Food Insecurity  . Worried About Charity fundraiser in the Last Year: Never true  . Ran Out of Food in the Last Year: Never true  Transportation Needs: No Transportation Needs  . Lack of Transportation (Medical): No  . Lack of Transportation (Non-Medical): No  Physical Activity: Sufficiently Active  . Days of Exercise per Week: 3 days  . Minutes of Exercise per Session: 60 min  Stress: No Stress Concern Present  . Feeling of Stress : Only a little  Social Connections: Moderately Integrated  . Frequency of Communication with Friends and Family: More than three times a week  . Frequency of Social Gatherings with Friends and Family: More than three times a week  . Attends Religious Services: More than 4 times per year  . Active Member of Clubs or Organizations: Yes  . Attends Archivist Meetings: More than 4 times per year  . Marital Status: Separated  Intimate Partner Violence: Not At Risk  . Fear of Current or Ex-Partner: No  . Emotionally Abused: No  . Physically Abused: No  . Sexually Abused: No   Social History   Social History Narrative   Separated in around 2009.   She was living with her daughter, however currently moved in with her sister and nephew , but is looking for a house.      ROS: Negative.     PE: HEENT: Negative. Lungs: Clear. Cardio: RR.  Assessment/Plan:   Proceed with planned resection.    Forest Gleason [facility] 03/06/2021

## 2021-03-06 NOTE — Anesthesia Preprocedure Evaluation (Signed)
Anesthesia Evaluation  Patient identified by MRN, date of birth, ID band Patient awake    Reviewed: Allergy & Precautions, H&P , NPO status , Patient's Chart, lab work & pertinent test results  History of Anesthesia Complications Negative for: history of anesthetic complications  Airway Mallampati: III  TM Distance: >3 FB Neck ROM: Full    Dental no notable dental hx. (+) Teeth Intact   Pulmonary sleep apnea and Continuous Positive Airway Pressure Ventilation , neg COPD, Patient abstained from smoking.Not current smoker,    Pulmonary exam normal breath sounds clear to auscultation       Cardiovascular Exercise Tolerance: Good METShypertension, (-) CAD and (-) Past MI (-) dysrhythmias  Rhythm:regular Rate:Normal - Systolic murmurs    Neuro/Psych negative neurological ROS  negative psych ROS   GI/Hepatic negative GI ROS, Neg liver ROS, neg GERD  ,  Endo/Other  diabetesHypothyroidism   Renal/GU CRFRenal diseasenegative Renal ROS  negative genitourinary   Musculoskeletal   Abdominal (+) + obese,   Peds  Hematology  (+) Blood dyscrasia, anemia ,   Anesthesia Other Findings Obesity  Past Medical History: No date: Allergy No date: Anemia No date: Arthritis of right shoulder region     Comment:  Dr. Tamala Julian No date: Chronic insomnia No date: Diabetes mellitus without complication ([place]) No date: Goiter     Comment:  Dr. Gabriel Carina No date: Hyperlipidemia No date: Hypertension No date: Hypertensive retinopathy of left eye No date: Hypothyroidism, adult     Comment:  Dr. Gabriel Carina No date: Inflammation of urethra No date: Menopause No date: Microalbuminuria     Comment:  DM No date: Mixed incontinence No date: Muscle cramps No date: Obesity No date: OSA (obstructive sleep apnea) No date: Radiculitis, lumbosacral No date: Vitamin D deficiency  Past Surgical History: 90's: BREAST BIOPSY; Right     Comment:  benign core  needle  No date: FOOT SURGERY 01/25/2015: TOTAL SHOULDER REPLACEMENT; Right     Comment:  Dr. Roland Rack 08/01/2018: TOTAL THYROIDECTOMY  BMI    Body Mass Index:  37.44 kg/m      Reproductive/Obstetrics negative OB ROS                             Anesthesia Physical  Anesthesia Plan  ASA: II  Anesthesia Plan: General   Post-op Pain Management:    Induction: Intravenous  PONV Risk Score and Plan: 3 and Ondansetron, Dexamethasone, Midazolam and Treatment may vary due to age or medical condition  Airway Management Planned: LMA  Additional Equipment: None  Intra-op Plan:   Post-operative Plan: Extubation in OR  Informed Consent: I have reviewed the patients History and Physical, chart, labs and discussed the procedure including the risks, benefits and alternatives for the proposed anesthesia with the patient or authorized representative who has indicated his/her understanding and acceptance.     Dental Advisory Given  Plan Discussed with: Anesthesiologist and CRNA  Anesthesia Plan Comments: (Discussed risks of anesthesia with patient, including PONV, sore throat, lip/dental damage. Rare risks discussed as well, such as cardiorespiratory and neurological sequelae. Patient understands.)        Anesthesia Quick Evaluation

## 2021-03-07 ENCOUNTER — Telehealth: Payer: Self-pay

## 2021-03-07 NOTE — Telephone Encounter (Signed)
Patient had breast lumpectomy on 4/26. Please schedule MD follow up 2 weeks after surgery and notify pf of appts.

## 2021-03-08 NOTE — Telephone Encounter (Signed)
03/08/2021 Spoke w/ pt and informed her about MD f/u on 03/20/21. Pt confirmed appt SRW

## 2021-03-09 ENCOUNTER — Other Ambulatory Visit: Payer: Self-pay | Admitting: Anatomic Pathology & Clinical Pathology

## 2021-03-09 LAB — SURGICAL PATHOLOGY

## 2021-03-14 ENCOUNTER — Other Ambulatory Visit: Payer: Self-pay | Admitting: General Surgery

## 2021-03-14 DIAGNOSIS — C50911 Malignant neoplasm of unspecified site of right female breast: Secondary | ICD-10-CM | POA: Diagnosis not present

## 2021-03-14 DIAGNOSIS — Z170 Estrogen receptor positive status [ER+]: Secondary | ICD-10-CM

## 2021-03-14 DIAGNOSIS — C50211 Malignant neoplasm of upper-inner quadrant of right female breast: Secondary | ICD-10-CM

## 2021-03-20 ENCOUNTER — Ambulatory Visit
Admission: RE | Admit: 2021-03-20 | Discharge: 2021-03-20 | Disposition: A | Discharge status: Home / Self Care | Payer: BC Managed Care – PPO | Source: Ambulatory Visit | Attending: Radiation Oncology | Admitting: Radiation Oncology

## 2021-03-20 ENCOUNTER — Other Ambulatory Visit: Payer: Self-pay

## 2021-03-20 ENCOUNTER — Encounter: Payer: Self-pay | Admitting: Oncology

## 2021-03-20 ENCOUNTER — Inpatient Hospital Stay: Payer: BC Managed Care – PPO | Attending: Oncology | Admitting: Oncology

## 2021-03-20 VITALS — BP 136/89 | HR 58 | Temp 96.5°F | Resp 16 | Wt 212.0 lb

## 2021-03-20 VITALS — BP 136/89 | HR 78 | Temp 96.5°F | Resp 16 | Wt 212.0 lb

## 2021-03-20 DIAGNOSIS — I10 Essential (primary) hypertension: Secondary | ICD-10-CM | POA: Diagnosis not present

## 2021-03-20 DIAGNOSIS — Z8249 Family history of ischemic heart disease and other diseases of the circulatory system: Secondary | ICD-10-CM | POA: Diagnosis not present

## 2021-03-20 DIAGNOSIS — E785 Hyperlipidemia, unspecified: Secondary | ICD-10-CM | POA: Insufficient documentation

## 2021-03-20 DIAGNOSIS — Z803 Family history of malignant neoplasm of breast: Secondary | ICD-10-CM | POA: Insufficient documentation

## 2021-03-20 DIAGNOSIS — E039 Hypothyroidism, unspecified: Secondary | ICD-10-CM | POA: Insufficient documentation

## 2021-03-20 DIAGNOSIS — Z833 Family history of diabetes mellitus: Secondary | ICD-10-CM | POA: Insufficient documentation

## 2021-03-20 DIAGNOSIS — Z7982 Long term (current) use of aspirin: Secondary | ICD-10-CM | POA: Diagnosis not present

## 2021-03-20 DIAGNOSIS — R809 Proteinuria, unspecified: Secondary | ICD-10-CM | POA: Diagnosis not present

## 2021-03-20 DIAGNOSIS — Z170 Estrogen receptor positive status [ER+]: Secondary | ICD-10-CM

## 2021-03-20 DIAGNOSIS — C50211 Malignant neoplasm of upper-inner quadrant of right female breast: Secondary | ICD-10-CM | POA: Insufficient documentation

## 2021-03-20 DIAGNOSIS — Z7984 Long term (current) use of oral hypoglycemic drugs: Secondary | ICD-10-CM | POA: Insufficient documentation

## 2021-03-20 DIAGNOSIS — G4730 Sleep apnea, unspecified: Secondary | ICD-10-CM | POA: Diagnosis not present

## 2021-03-20 DIAGNOSIS — Z79899 Other long term (current) drug therapy: Secondary | ICD-10-CM | POA: Insufficient documentation

## 2021-03-20 DIAGNOSIS — E559 Vitamin D deficiency, unspecified: Secondary | ICD-10-CM | POA: Insufficient documentation

## 2021-03-20 DIAGNOSIS — K219 Gastro-esophageal reflux disease without esophagitis: Secondary | ICD-10-CM | POA: Diagnosis not present

## 2021-03-20 DIAGNOSIS — E119 Type 2 diabetes mellitus without complications: Secondary | ICD-10-CM | POA: Insufficient documentation

## 2021-03-20 NOTE — Consult Note (Signed)
NEW PATIENT EVALUATION  Name: Andrea Randolph  MRN: [id number]  Date:   03/20/2021     DOB: 1956-11-02   This 65 y.o. female patient presents to the clinic for initial evaluation of stage Ia (T1 a N0 M0) ER/PR positive invasive mammary carcinoma of the right breast status post wide local excision and sentinel node biopsy.  REFERRING PHYSICIAN: Steele Sizer, MD  CHIEF COMPLAINT:  Chief Complaint  Patient presents with  . Consult    DIAGNOSIS: The encounter diagnosis was Malignant 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, estrogen receptor positive ([place]).   PREVIOUS INVESTIGATIONS:  Mammogram and ultrasound reviewed Pathology report reviewed Clinical notes reviewed  HPI: Patient is a 65 year old female who presents with an abnormal mammogram of the right breast showing a lesion at 1:00 10 cm from the nipple measuring 3 x 5 x 4 mm she underwent targeted ultrasound of the lesion which was positive for invasive mammary carcinoma right axilla was normal.  She underwent a wide local excision for 0.5 cm overall grade 2 invasive mammary carcinoma.  There is also intermediate grade DCIS present.  And margin was unifocal he positive for DCIS.  Her margins for invasive: Was 3 mm tumor was ER/PR positive HER2/neu not overexpressed.  3 sentinel lymph nodes were negative.  She is done well postoperatively.  She is not a candidate based on the small size of her tumor for systemic chemotherapy.  She has been referred to radiation collagen for opinion she specifically denies breast tenderness cough or bone pain.  PLANNED TREATMENT REGIMEN: Hypofractionated right whole breast radiation  PAST MEDICAL HISTORY:  has a past medical history of Allergy, Anemia, Arthritis of right shoulder region, Chronic insomnia, Diabetes mellitus without complication ([place]), Family history of adverse reaction to anesthesia, GERD (gastroesophageal reflux disease), Goiter, Heart murmur, Hyperlipidemia,  Hypertension, Hypertensive retinopathy of left eye, Hypothyroidism, adult, Inflammation of urethra, Menopause, Microalbuminuria, Mixed incontinence, Muscle cramps, Obesity, OSA (obstructive sleep apnea), Radiculitis, lumbosacral, and Vitamin D deficiency.    PAST SURGICAL HISTORY:  Past Surgical History:  Procedure Laterality Date  . BREAST BIOPSY Left 90's   benign core needle   . BREAST BIOPSY Right 02/07/2021   [place] Bx, Q-clip, path pending   . BREAST LUMPECTOMY WITH SENTINEL LYMPH NODE BIOPSY Right 03/06/2021   Procedure: BREAST LUMPECTOMY WITH SENTINEL LYMPH NODE BX;  Surgeon: Robert Bellow, MD;  Location: [facility] ORS;  Service: General;  Laterality: Right;  . COLONOSCOPY WITH PROPOFOL N/A 01/26/2019   Procedure: COLONOSCOPY WITH PROPOFOL;  Surgeon: Lin Landsman, MD;  Location[facility] ENDOSCOPY;  Service: Gastroenterology;  Laterality: N/A;  . FOOT SURGERY Bilateral    heel spurs  . JOINT REPLACEMENT    . TOTAL SHOULDER REPLACEMENT Right 01/25/2015   Dr. Roland Rack  . TOTAL THYROIDECTOMY  08/01/2018    FAMILY HISTORY: family history includes Breast cancer (age of onset: 64) in her maternal aunt; CVA in her brother; Diabetes in her sister; Heart disease in her father and mother; Hypertension in her brother, sister, and sister; Kidney disease in her sister; Kidney failure in her sister.  SOCIAL HISTORY:  reports that she has never smoked. She has never used smokeless tobacco. She reports that she does not drink alcohol and does not use drugs.  ALLERGIES: Rosuvastatin  MEDICATIONS:  Current Outpatient Medications  Medication Sig Dispense Refill  . aspirin 81 MG tablet Take 81 mg by mouth daily.    . Cholecalciferol (VITAMIN D3) 1000 units CAPS Take 1,000  Units by mouth daily.    . dapagliflozin propanediol (FARXIGA) 10 MG TABS tablet Take 1 tablet (10 mg total) by mouth daily before breakfast. In place of Xigduo 90 tablet 0  . diclofenac Sodium (VOLTAREN) 1 % GEL Apply 2 g topically 4  (four) times daily. (Patient taking differently: Apply 2 g topically 4 (four) times daily as needed.) 300 g 0  . ezetimibe (ZETIA) 10 MG tablet TAKE 1 TABLET DAILY (Patient taking differently: Take 10 mg by mouth daily.) 90 tablet 1  . glucose blood test strip FREESTYLE LITE TEST (In Vitro Strip)  use as directed for 0 days  Quantity: 200.00;  Refills: 3   Ordered :25-Nov-2014  Vonna Kotyk ;  Started 30-Dec-2008 Active Comments: DX: 250.00    . HYDROcodone-acetaminophen (NORCO/VICODIN) 5-325 MG tablet Take 1 tablet by mouth every 4 (four) hours as needed for moderate pain. (Patient not taking: Reported on 03/20/2021) 12 tablet 0  . ketoconazole (NIZORAL) 2 % cream Apply 1 application topically daily. (Patient taking differently: Apply 1 application topically daily as needed for irritation.) 60 g 0  . levothyroxine (SYNTHROID) 125 MCG tablet Take 1 tablet (125 mcg total) by mouth daily. 90 tablet 1  . Olmesartan-amLODIPine-HCTZ 40-5-25 MG TABS TAKE 1 TABLET DAILY (SCHEDULE APPOINTMENT FOR FURTHER REFILLS) (Patient taking differently: Take 1 tablet by mouth daily.) 90 tablet 3  . pravastatin (PRAVACHOL) 20 MG tablet TAKE 1 TABLET BY MOUTH ONCE DAILY IN THE EVENING (Patient taking differently: Take 20 mg by mouth every evening.) 30 tablet 5  . Semaglutide (RYBELSUS) 7 MG TABS Take 1 tablet by mouth daily. (Patient taking differently: Take 7 mg by mouth daily.) 90 tablet 1  . acetaminophen (TYLENOL) 325 MG tablet Take by mouth.    . lidocaine-prilocaine (EMLA) cream Apply to areola one hour prior to arrival day of procedure. Cover with saran wrap.     No current facility-administered medications for this encounter.    ECOG PERFORMANCE STATUS:  0 - Asymptomatic  REVIEW OF SYSTEMS: Patient denies any weight loss, fatigue, weakness, fever, chills or night sweats. Patient denies any loss of vision, blurred vision. Patient denies any ringing  of the ears or hearing loss. No irregular heartbeat.  Patient denies heart murmur or history of fainting. Patient denies any chest pain or pain radiating to her upper extremities. Patient denies any shortness of breath, difficulty breathing at night, cough or hemoptysis. Patient denies any swelling in the lower legs. Patient denies any nausea vomiting, vomiting of blood, or coffee ground material in the vomitus. Patient denies any stomach pain. Patient states has had normal bowel movements no significant constipation or diarrhea. Patient denies any dysuria, hematuria or significant nocturia. Patient denies any problems walking, swelling in the joints or loss of balance. Patient denies any skin changes, loss of hair or loss of weight. Patient denies any excessive worrying or anxiety or significant depression. Patient denies any problems with insomnia. Patient denies excessive thirst, polyuria, polydipsia. Patient denies any swollen glands, patient denies easy bruising or easy bleeding. Patient denies any recent infections, allergies or URI. Patient "s visual fields have not changed significantly in recent time.   PHYSICAL EXAM: BP 136/89   Pulse 78   Temp (!) 96.5 F (35.8 C)   Resp 16   Wt 212 lb (96.2 kg)   SpO2 100%   BMI 36.39 kg/m  She status post wide local excision of the right breast.  Incisions healed well.  No dominant mass of either breast  is noted no axillary or supraclavicular adenopathy is appreciated.  Well-developed well-nourished patient in NAD. HEENT reveals PERLA, EOMI, discs not visualized.  Oral cavity is clear. No oral mucosal lesions are identified. Neck is clear without evidence of cervical or supraclavicular adenopathy. Lungs are clear to A&P. Cardiac examination is essentially unremarkable with regular rate and rhythm without murmur rub or thrill. Abdomen is benign with no organomegaly or masses noted. Motor sensory and DTR levels are equal and symmetric in the upper and lower extremities. Cranial nerves II through XII are grossly  intact. Proprioception is intact. No peripheral adenopathy or edema is identified. No motor or sensory levels are noted. Crude visual fields are within normal range.  LABORATORY DATA: Pathology report reviewed    RADIOLOGY RESULTS: Mammogram and ultrasound reviewed compatible with above-stated findings   IMPRESSION: Stage Ia invasive mammary carcinoma the right breast status post wide local excision and sentinel node biopsy ER/PR positive in 65 year old female  PLAN: At this time I have gone over recommendations for radiation therapy.  We have gone over placement of MammoSite balloon as well as high-dose-rate remote afterloading for partial breast radiation.  I have also gone over whole breast radiation and hypofractionated regimen over 3 weeks boosting her scar another 1600 centigrade post on the infant's unifocal involved DCIS margin.  After consideration patient would like to not have any further surgical procedures and is opted for whole breast radiation.  I have personally set up and ordered CT simulation for later this week risks and benefits of treatment including skin reaction fatigue alteration of blood counts possible inclusion of superficial lung all were described in detail to the patient.  Patient comprehends my recommendations well.  She also will be a candidate for antiestrogen therapy after completion of treatment.  I would like to take this opportunity to thank you for allowing me to participate in the care of your patient.Noreene Filbert, MD

## 2021-03-20 NOTE — Progress Notes (Signed)
Patient denies new problems/concerns today.   °

## 2021-03-20 NOTE — Progress Notes (Signed)
Hematology/Oncology Consult note [facility] [phone] [phone]   Patient Care Team: Steele Sizer, MD as PCP - General [name], MD as PCP - Cardiology (Cardiology) Rico Junker, RN as Oncology Nurse Navigator  REFERRING PROVIDER: Steele Sizer, MD  CHIEF COMPLAINTS/REASON FOR VISIT:  Evaluation of breast cancer.   HISTORY OF PRESENTING ILLNESS:   Andrea Randolph is a  65 y.o.  female with PMH listed below was seen in consultation at the request of  Steele Sizer, MD  for evaluation of breast cancer.   01/12/21 screening mammogram showed focal asymmetry with possible distortion.  01/26/21 right diagnostic mammogram and US showed 3x5x13m hypoechoic mass, s/p biopsy on 02/07/21 Pathology showed invasive mammary carcinoma, grade 2, ER90% PR 90%, HER2 negative.   Patient was seen by Dr.Byrnett yesterday. She presents to establish care with oncology Accompanied by her daughter  Menarche 89years with Age at first childbirth 89  OCP use for about 20 years Denies any estrogen replacement. LMP in 52s Denies any previous chest radiation. History of left breast biopsy previously. Family history Father for maternal aunt who was diagnosed of breast cancer at age of 541  INTERVAL HISTORY MRAE PLOTNERis a 65y.o. female who has above history reviewed by me today presents for follow up visit for management of breast cancer Problems and complaints are listed below: 03/06/2021 lumpectomy and sentinel lymph node biopsy. Pathology showed invasive mammary carcinoma, no special type, DCIS in situ, intermediate grade.  All 3 sentinel lymph nodes were negative.  1 nonsentinel lymph node was negative.  Margins for invasive carcinoma was negative.  Margins for DCIS was unifocal positive. Patient has establish care with radiation oncology.  Review of Systems  Constitutional: Negative for appetite change, chills, fatigue and fever.   HENT:   Negative for hearing loss and voice change.   Eyes: Negative for eye problems.  Respiratory: Negative for chest tightness and cough.   Cardiovascular: Negative for chest pain.  Gastrointestinal: Negative for abdominal distention, abdominal pain and blood in stool.  Endocrine: Negative for hot flashes.  Genitourinary: Negative for difficulty urinating and frequency.   Musculoskeletal: Negative for arthralgias.  Skin: Negative for itching and rash.  Neurological: Negative for extremity weakness.  Hematological: Negative for adenopathy.  Psychiatric/Behavioral: Negative for confusion. The patient is not nervous/anxious.     MEDICAL HISTORY:  Past Medical History:  Diagnosis Date  . Allergy   . Anemia   . Arthritis of right shoulder region    Dr. STamala Julian . Chronic insomnia   . [place] mellitus [place] complication [place]   . Family history of adverse reaction to anesthesia    sister with Nausea  . GERD (gastroesophageal reflux disease)   . Goiter    Dr. SGabriel Carina . Heart murmur   . Hyperlipidemia   . Hypertension   . Hypertensive retinopathy of left eye   . Hypothyroidism, adult    Dr. SGabriel Carina . Inflammation of urethra   . Menopause   . Microalbuminuria    DM  . Mixed incontinence   . Muscle cramps   . Obesity   . OSA (obstructive sleep apnea)   . Radiculitis, lumbosacral   . Vitamin D deficiency     SURGICAL HISTORY: Past Surgical History:  Procedure Laterality Date  . BREAST BIOPSY Left 90's   benign core needle   . BREAST BIOPSY Right 02/07/2021   [place], Q-clip, path pending   . BREAST LUMPECTOMY  WITH SENTINEL LYMPH NODE BIOPSY Right 03/06/2021   Procedure: BREAST LUMPECTOMY WITH SENTINEL LYMPH NODE BX;  Surgeon: Robert Bellow, MD;  Location: [facility] ORS;  Service: General;  Laterality: Right;  . COLONOSCOPY WITH PROPOFOL N/A 01/26/2019   Procedure: COLONOSCOPY WITH PROPOFOL;  Surgeon: Lin Landsman, MD;  Location: [facility] ENDOSCOPY;  Service:  Gastroenterology;  Laterality: N/A;  . FOOT SURGERY Bilateral    heel spurs  . JOINT REPLACEMENT    . TOTAL SHOULDER REPLACEMENT Right 01/25/2015   Dr. Roland Rack  . TOTAL THYROIDECTOMY  08/01/2018    SOCIAL HISTORY: Social History   Socioeconomic History  . Marital status: Legally Separated    Spouse name: Not on file  . Number of children: 1  . Years of education: Not on file  . Highest education level: 12th grade  Occupational History  . Occupation: Cytogeneticist  Tobacco Use  . Smoking status: Never Smoker  . Smokeless tobacco: Never Used  Vaping Use  . Vaping Use: Never used  Substance and Sexual Activity  . Alcohol use: No    Alcohol/week: 0.0 standard drinks  . Drug use: No  . Sexual activity: Not Currently    Birth control/protection: Post-menopausal  Other Topics Concern  . Not on file  Social History Narrative   Separated in around 2009.   She was living with her daughter, however currently moved in with her sister and nephew , but is looking for a house.    Social Determinants of Health   Financial Resource Strain: Low Risk   . Difficulty of Paying Living Expenses: Not hard at all  Food Insecurity: No Food Insecurity  . Worried About Charity fundraiser in the Last Year: Never true  . Ran Out of Food in the Last Year: Never true  Transportation Needs: No Transportation Needs  . Lack of Transportation (Medical): No  . Lack of Transportation (Non-Medical): No  Physical Activity: Sufficiently Active  . Days of Exercise per Week: 3 days  . Minutes of Exercise per Session: 60 min  Stress: No Stress Concern Present  . Feeling of Stress : Only a little  Social Connections: Moderately Integrated  . Frequency of Communication with Friends and Family: More than three times a week  . Frequency of Social Gatherings with Friends and Family: More than three times a week  . Attends Religious Services: More than 4 times per year  . Active Member of Clubs or  Organizations: Yes  . Attends Archivist Meetings: More than 4 times per year  . Marital Status: Separated  Intimate Partner Violence: Not At Risk  . Fear of Current or Ex-Partner: No  . Emotionally Abused: No  . Physically Abused: No  . Sexually Abused: No    FAMILY HISTORY: Family History  Problem Relation Age of Onset  . Heart disease Mother   . Heart disease Father   . Kidney disease Sister   . Hypertension Sister   . Diabetes Sister   . Hypertension Brother   . CVA Brother   . Breast cancer Maternal Aunt 55  . Hypertension Sister   . Kidney failure Sister   . Ovarian cancer Neg Hx   . Colon cancer Neg Hx     ALLERGIES:  is allergic to rosuvastatin.  MEDICATIONS:  Current Outpatient Medications  Medication Sig Dispense Refill  . aspirin 81 MG tablet Take 81 mg by mouth daily.    . Cholecalciferol (VITAMIN D3) 1000 units CAPS Take 1,000 Units  by mouth daily.    . dapagliflozin propanediol (FARXIGA) 10 MG TABS tablet Take 1 tablet (10 mg total) by mouth daily before breakfast. In place of Xigduo 90 tablet 0  . diclofenac Sodium (VOLTAREN) 1 % GEL Apply 2 g topically 4 (four) times daily. (Patient taking differently: Apply 2 g topically 4 (four) times daily as needed.) 300 g 0  . ezetimibe (ZETIA) 10 MG tablet TAKE 1 TABLET DAILY (Patient taking differently: Take 10 mg by mouth daily.) 90 tablet 1  . glucose blood test strip FREESTYLE LITE TEST (In Vitro Strip)  use as directed for 0 days  Quantity: 200.00;  Refills: 3   Ordered :25-Nov-2014  Vonna Kotyk ;  Started 30-Dec-2008 Active Comments: DX: 250.00    . ketoconazole (NIZORAL) 2 % cream Apply 1 application topically daily. (Patient taking differently: Apply 1 application topically daily as needed for irritation.) 60 g 0  . levothyroxine (SYNTHROID) 125 MCG tablet Take 1 tablet (125 mcg total) by mouth daily. 90 tablet 1  . lidocaine-prilocaine (EMLA) cream Apply to areola one hour prior to arrival  day of procedure. Cover with saran wrap.    . Olmesartan-amLODIPine-HCTZ 40-5-25 MG TABS TAKE 1 TABLET DAILY (SCHEDULE APPOINTMENT FOR FURTHER REFILLS) (Patient taking differently: Take 1 tablet by mouth daily.) 90 tablet 3  . pravastatin (PRAVACHOL) 20 MG tablet TAKE 1 TABLET BY MOUTH ONCE DAILY IN THE EVENING (Patient taking differently: Take 20 mg by mouth every evening.) 30 tablet 5  . Semaglutide (RYBELSUS) 7 MG TABS Take 1 tablet by mouth daily. (Patient taking differently: Take 7 mg by mouth daily.) 90 tablet 1  . acetaminophen (TYLENOL) 325 MG tablet Take by mouth.    Marland Kitchen HYDROcodone-acetaminophen (NORCO/VICODIN) 5-325 MG tablet Take 1 tablet by mouth every 4 (four) hours as needed for moderate pain. (Patient not taking: Reported on 03/20/2021) 12 tablet 0   No current facility-administered medications for this visit.     PHYSICAL EXAMINATION: ECOG PERFORMANCE STATUS: 0 - Asymptomatic Vitals:   03/20/21 1037  BP: 136/89  Pulse: (!) 58  Resp: 16  Temp: (!) 96.5 F (35.8 C)   Filed Weights   03/20/21 1037  Weight: 212 lb (96.2 kg)    Physical Exam Constitutional:      General: She is not in acute distress. HENT:     Head: Normocephalic and atraumatic.  Eyes:     General: No scleral icterus. Cardiovascular:     Rate and Rhythm: Normal rate and regular rhythm.     Heart sounds: Normal heart sounds.  Pulmonary:     Effort: Pulmonary effort is normal. No respiratory distress.     Breath sounds: No wheezing.  Abdominal:     General: Bowel sounds are normal. There is no distension.     Palpations: Abdomen is soft.  Musculoskeletal:        General: No deformity. Normal range of motion.     Cervical back: Normal range of motion and neck supple.  Skin:    General: Skin is warm and dry.     Findings: No erythema or rash.  Neurological:     Mental Status: She is alert and oriented to person, place, and time. Mental status is at baseline.     Cranial Nerves: No cranial nerve  deficit.     Coordination: Coordination normal.  Psychiatric:        Mood and Affect: Mood normal.     LABORATORY DATA:  I have reviewed the data as  listed Lab Results  Component Value Date   WBC 3.8 01/26/2021   HGB 14.0 01/26/2021   HCT 43.5 01/26/2021   MCV 83.8 01/26/2021   PLT 393 01/26/2021   Recent Labs    06/20/20 0941 10/20/20 1045 01/26/21 0949  NA 140 143 141  K 3.7 4.0 4.1  CL 103 105 103  CO2 28 30 29   GLUCOSE 89 85 92  BUN 16 19 17   CREATININE 1.13* 1.04* 1.15*  CALCIUM 9.3 9.8 9.7  GFRNONAA 52* 57* 51*  GFRAA 60 66 59*  PROT 6.8 7.1 7.0  AST 18 17 17   ALT 18 16 16   BILITOT 0.4 0.4 0.5   Iron/TIBC/Ferritin/ %Sat    Component Value Date/Time   FERRITIN 27 08/29/2015 1135      RADIOGRAPHIC STUDIES: I have personally reviewed the radiological images as listed and agreed with the findings in the report. NM Sentinel Node Inj-No Rpt (Breast)  Result Date: 03/06/2021 Sulfur colloid was injected by the nuclear medicine technologist for melanoma sentinel node.   MM BREAST SURGICAL SPECIMEN  Result Date: 03/06/2021 CLINICAL DATA:  Evaluate surgical specimen following excision of a right breast lesion. EXAM: SPECIMEN RADIOGRAPH OF THE RIGHT BREAST COMPARISON:  Previous exam(s). FINDINGS: Status post excision of the right breast. The small mass and calcifications and the associated Q shaped biopsy clip are within the specimen, projecting closest to J-7. IMPRESSION: Specimen radiograph of the right breast. Electronically Signed   By: Lajean Manes M.D.   On: 03/06/2021 10:19      ASSESSMENT & PLAN:  1. Malignant 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, estrogen receptor positive ([place])   Cancer Staging Malignant 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, estrogen receptor positive ([place]) Staging form: Breast, AJCC 8th Edition - Pathologic stage from 03/20/2021: Stage IA (pT1a, pN0, cM0, G2, ER+, PR+, HER2-) - Signed by Earlie Server, MD on 03/20/2021   Right breast invasive carcinoma.  pT1a pN0, ER positive, PR positive, HER2 negative. Focal DCIS margin positive. Patient declined reexcision.Patient's case was discussed on breast cancer tumor board on 03/20/2021.  She has establish care with radiation oncology and accelerated partial breast radiation has been offered to her.  Patient declined and opted to proceed with whole breast radiation. Patient will follow-up with me 2 weeks after finished radiation to start antiestrogen treatments.  Orders Placed This Encounter  Procedures  . CBC with Differential/Platelet    Standing Status:   Future    Standing Expiration Date:   03/20/2022  . Comprehensive metabolic panel    Standing Status:   Future    Standing Expiration Date:   03/20/2022    All questions were answered. The patient knows to call the clinic with any problems questions or concerns.  cc Steele Sizer, MD    Return of visit: I will see patient 2 weeks afterlast dose of RT. Thank you for this kind referral and the opportunity to participate in the care of this patient. A copy of today's note is routed to referring provider    Earlie Server, MD, PhD Hematology Oncology [facility] Pager- [phone] 03/20/2021

## 2021-03-22 ENCOUNTER — Ambulatory Visit
Admission: RE | Admit: 2021-03-22 | Discharge: 2021-03-22 | Disposition: A | Discharge status: Home / Self Care | Payer: BC Managed Care – PPO | Source: Ambulatory Visit | Attending: Radiation Oncology | Admitting: Radiation Oncology

## 2021-03-22 ENCOUNTER — Institutional Professional Consult (permissible substitution): Payer: BC Managed Care – PPO | Admitting: Radiation Oncology

## 2021-03-22 DIAGNOSIS — Z170 Estrogen receptor positive status [ER+]: Secondary | ICD-10-CM | POA: Insufficient documentation

## 2021-03-22 DIAGNOSIS — Z510 Encounter for antineoplastic radiation therapy: Secondary | ICD-10-CM | POA: Insufficient documentation

## 2021-03-22 DIAGNOSIS — C50211 Malignant neoplasm of upper-inner quadrant of right female breast: Secondary | ICD-10-CM | POA: Diagnosis not present

## 2021-03-23 DIAGNOSIS — Z510 Encounter for antineoplastic radiation therapy: Secondary | ICD-10-CM | POA: Diagnosis not present

## 2021-03-23 DIAGNOSIS — Z170 Estrogen receptor positive status [ER+]: Secondary | ICD-10-CM | POA: Diagnosis not present

## 2021-03-23 DIAGNOSIS — C50211 Malignant neoplasm of upper-inner quadrant of right female breast: Secondary | ICD-10-CM | POA: Diagnosis not present

## 2021-03-24 ENCOUNTER — Other Ambulatory Visit: Payer: Self-pay | Admitting: *Deleted

## 2021-03-24 DIAGNOSIS — C50211 Malignant neoplasm of upper-inner quadrant of right female breast: Secondary | ICD-10-CM

## 2021-03-24 DIAGNOSIS — Z170 Estrogen receptor positive status [ER+]: Secondary | ICD-10-CM

## 2021-03-28 DIAGNOSIS — H5213 Myopia, bilateral: Secondary | ICD-10-CM | POA: Diagnosis not present

## 2021-03-28 LAB — HM DIABETES EYE EXAM

## 2021-03-29 ENCOUNTER — Ambulatory Visit: Admission: RE | Admit: 2021-03-29 | Payer: BC Managed Care – PPO | Source: Ambulatory Visit

## 2021-03-29 DIAGNOSIS — Z510 Encounter for antineoplastic radiation therapy: Secondary | ICD-10-CM | POA: Diagnosis not present

## 2021-03-29 DIAGNOSIS — C50211 Malignant neoplasm of upper-inner quadrant of right female breast: Secondary | ICD-10-CM | POA: Diagnosis not present

## 2021-03-29 DIAGNOSIS — Z170 Estrogen receptor positive status [ER+]: Secondary | ICD-10-CM | POA: Diagnosis not present

## 2021-03-30 ENCOUNTER — Ambulatory Visit
Admission: RE | Admit: 2021-03-30 | Discharge: 2021-03-30 | Disposition: A | Discharge status: Home / Self Care | Payer: BC Managed Care – PPO | Source: Ambulatory Visit | Attending: Radiation Oncology | Admitting: Radiation Oncology

## 2021-03-30 ENCOUNTER — Encounter: Payer: Self-pay | Admitting: Family Medicine

## 2021-03-30 DIAGNOSIS — Z170 Estrogen receptor positive status [ER+]: Secondary | ICD-10-CM | POA: Diagnosis not present

## 2021-03-30 DIAGNOSIS — C50211 Malignant neoplasm of upper-inner quadrant of right female breast: Secondary | ICD-10-CM | POA: Diagnosis not present

## 2021-03-30 DIAGNOSIS — Z510 Encounter for antineoplastic radiation therapy: Secondary | ICD-10-CM | POA: Diagnosis not present

## 2021-03-31 ENCOUNTER — Ambulatory Visit
Admission: RE | Admit: 2021-03-31 | Discharge: 2021-03-31 | Disposition: A | Discharge status: Home / Self Care | Payer: BC Managed Care – PPO | Source: Ambulatory Visit | Attending: Radiation Oncology | Admitting: Radiation Oncology

## 2021-03-31 DIAGNOSIS — C50211 Malignant neoplasm of upper-inner quadrant of right female breast: Secondary | ICD-10-CM | POA: Diagnosis not present

## 2021-03-31 DIAGNOSIS — Z170 Estrogen receptor positive status [ER+]: Secondary | ICD-10-CM | POA: Diagnosis not present

## 2021-03-31 DIAGNOSIS — Z510 Encounter for antineoplastic radiation therapy: Secondary | ICD-10-CM | POA: Diagnosis not present

## 2021-04-03 ENCOUNTER — Ambulatory Visit
Admission: RE | Admit: 2021-04-03 | Discharge: 2021-04-03 | Disposition: A | Discharge status: Home / Self Care | Payer: BC Managed Care – PPO | Source: Ambulatory Visit | Attending: Radiation Oncology | Admitting: Radiation Oncology

## 2021-04-03 DIAGNOSIS — Z170 Estrogen receptor positive status [ER+]: Secondary | ICD-10-CM | POA: Diagnosis not present

## 2021-04-03 DIAGNOSIS — Z510 Encounter for antineoplastic radiation therapy: Secondary | ICD-10-CM | POA: Diagnosis not present

## 2021-04-03 DIAGNOSIS — C50211 Malignant neoplasm of upper-inner quadrant of right female breast: Secondary | ICD-10-CM | POA: Diagnosis not present

## 2021-04-04 ENCOUNTER — Ambulatory Visit
Admission: RE | Admit: 2021-04-04 | Discharge: 2021-04-04 | Disposition: A | Discharge status: Home / Self Care | Payer: BC Managed Care – PPO | Source: Ambulatory Visit | Attending: Radiation Oncology | Admitting: Radiation Oncology

## 2021-04-04 DIAGNOSIS — Z510 Encounter for antineoplastic radiation therapy: Secondary | ICD-10-CM | POA: Diagnosis not present

## 2021-04-04 DIAGNOSIS — C50211 Malignant neoplasm of upper-inner quadrant of right female breast: Secondary | ICD-10-CM | POA: Diagnosis not present

## 2021-04-04 DIAGNOSIS — Z170 Estrogen receptor positive status [ER+]: Secondary | ICD-10-CM | POA: Diagnosis not present

## 2021-04-05 ENCOUNTER — Ambulatory Visit
Admission: RE | Admit: 2021-04-05 | Discharge: 2021-04-05 | Disposition: A | Discharge status: Home / Self Care | Payer: BC Managed Care – PPO | Source: Ambulatory Visit | Attending: Radiation Oncology | Admitting: Radiation Oncology

## 2021-04-05 DIAGNOSIS — Z510 Encounter for antineoplastic radiation therapy: Secondary | ICD-10-CM | POA: Diagnosis not present

## 2021-04-05 DIAGNOSIS — C50211 Malignant neoplasm of upper-inner quadrant of right female breast: Secondary | ICD-10-CM | POA: Diagnosis not present

## 2021-04-05 DIAGNOSIS — Z170 Estrogen receptor positive status [ER+]: Secondary | ICD-10-CM | POA: Diagnosis not present

## 2021-04-06 ENCOUNTER — Ambulatory Visit
Admission: RE | Admit: 2021-04-06 | Discharge: 2021-04-06 | Disposition: A | Discharge status: Home / Self Care | Payer: BC Managed Care – PPO | Source: Ambulatory Visit | Attending: Radiation Oncology | Admitting: Radiation Oncology

## 2021-04-06 DIAGNOSIS — Z170 Estrogen receptor positive status [ER+]: Secondary | ICD-10-CM | POA: Diagnosis not present

## 2021-04-06 DIAGNOSIS — C50211 Malignant neoplasm of upper-inner quadrant of right female breast: Secondary | ICD-10-CM | POA: Diagnosis not present

## 2021-04-06 DIAGNOSIS — Z510 Encounter for antineoplastic radiation therapy: Secondary | ICD-10-CM | POA: Diagnosis not present

## 2021-04-07 ENCOUNTER — Ambulatory Visit: Payer: BC Managed Care – PPO

## 2021-04-07 ENCOUNTER — Ambulatory Visit
Admission: RE | Admit: 2021-04-07 | Discharge: 2021-04-07 | Disposition: A | Discharge status: Home / Self Care | Payer: BC Managed Care – PPO | Source: Ambulatory Visit | Attending: Radiation Oncology | Admitting: Radiation Oncology

## 2021-04-07 DIAGNOSIS — Z170 Estrogen receptor positive status [ER+]: Secondary | ICD-10-CM | POA: Diagnosis not present

## 2021-04-07 DIAGNOSIS — C50211 Malignant neoplasm of upper-inner quadrant of right female breast: Secondary | ICD-10-CM | POA: Diagnosis not present

## 2021-04-07 DIAGNOSIS — Z510 Encounter for antineoplastic radiation therapy: Secondary | ICD-10-CM | POA: Diagnosis not present

## 2021-04-11 ENCOUNTER — Ambulatory Visit
Admission: RE | Admit: 2021-04-11 | Discharge: 2021-04-11 | Disposition: A | Discharge status: Home / Self Care | Payer: BC Managed Care – PPO | Source: Ambulatory Visit | Attending: Radiation Oncology | Admitting: Radiation Oncology

## 2021-04-11 DIAGNOSIS — Z170 Estrogen receptor positive status [ER+]: Secondary | ICD-10-CM | POA: Diagnosis not present

## 2021-04-11 DIAGNOSIS — C50211 Malignant neoplasm of upper-inner quadrant of right female breast: Secondary | ICD-10-CM | POA: Diagnosis not present

## 2021-04-11 DIAGNOSIS — Z510 Encounter for antineoplastic radiation therapy: Secondary | ICD-10-CM | POA: Diagnosis not present

## 2021-04-12 ENCOUNTER — Other Ambulatory Visit: Payer: Self-pay

## 2021-04-12 ENCOUNTER — Inpatient Hospital Stay: Payer: BC Managed Care – PPO | Attending: Radiation Oncology

## 2021-04-12 ENCOUNTER — Ambulatory Visit
Admission: RE | Admit: 2021-04-12 | Discharge: 2021-04-12 | Disposition: A | Discharge status: Home / Self Care | Payer: BC Managed Care – PPO | Source: Ambulatory Visit | Attending: Radiation Oncology | Admitting: Radiation Oncology

## 2021-04-12 DIAGNOSIS — Z510 Encounter for antineoplastic radiation therapy: Secondary | ICD-10-CM | POA: Diagnosis not present

## 2021-04-12 DIAGNOSIS — I10 Essential (primary) hypertension: Secondary | ICD-10-CM | POA: Diagnosis not present

## 2021-04-12 DIAGNOSIS — C50211 Malignant neoplasm of upper-inner quadrant of right female breast: Secondary | ICD-10-CM | POA: Diagnosis not present

## 2021-04-12 DIAGNOSIS — Z170 Estrogen receptor positive status [ER+]: Secondary | ICD-10-CM | POA: Insufficient documentation

## 2021-04-12 DIAGNOSIS — Z7984 Long term (current) use of oral hypoglycemic drugs: Secondary | ICD-10-CM | POA: Insufficient documentation

## 2021-04-12 DIAGNOSIS — Z79899 Other long term (current) drug therapy: Secondary | ICD-10-CM | POA: Insufficient documentation

## 2021-04-12 DIAGNOSIS — E119 Type 2 diabetes mellitus without complications: Secondary | ICD-10-CM | POA: Insufficient documentation

## 2021-04-12 DIAGNOSIS — Z7982 Long term (current) use of aspirin: Secondary | ICD-10-CM | POA: Diagnosis not present

## 2021-04-12 DIAGNOSIS — E876 Hypokalemia: Secondary | ICD-10-CM | POA: Diagnosis not present

## 2021-04-12 DIAGNOSIS — E039 Hypothyroidism, unspecified: Secondary | ICD-10-CM | POA: Insufficient documentation

## 2021-04-12 DIAGNOSIS — G4733 Obstructive sleep apnea (adult) (pediatric): Secondary | ICD-10-CM | POA: Diagnosis not present

## 2021-04-12 DIAGNOSIS — E785 Hyperlipidemia, unspecified: Secondary | ICD-10-CM | POA: Diagnosis not present

## 2021-04-12 LAB — CBC
HCT: 39.2 % (ref 36.0–46.0)
Hemoglobin: 13 g/dL (ref 12.0–15.0)
MCH: 27.3 pg (ref 26.0–34.0)
MCHC: 33.2 g/dL (ref 30.0–36.0)
MCV: 82.2 fL (ref 80.0–100.0)
Platelets: 291 10*3/uL (ref 150–400)
RBC: 4.77 MIL/uL (ref 3.87–5.11)
RDW: 14.3 % (ref 11.5–15.5)
WBC: 3.4 10*3/uL — ABNORMAL LOW (ref 4.0–10.5)
nRBC: 0 % (ref 0.0–0.2)

## 2021-04-13 ENCOUNTER — Ambulatory Visit
Admission: RE | Admit: 2021-04-13 | Discharge: 2021-04-13 | Disposition: A | Discharge status: Home / Self Care | Payer: BC Managed Care – PPO | Source: Ambulatory Visit | Attending: Radiation Oncology | Admitting: Radiation Oncology

## 2021-04-13 DIAGNOSIS — Z510 Encounter for antineoplastic radiation therapy: Secondary | ICD-10-CM | POA: Diagnosis not present

## 2021-04-13 DIAGNOSIS — C50211 Malignant neoplasm of upper-inner quadrant of right female breast: Secondary | ICD-10-CM | POA: Diagnosis not present

## 2021-04-13 DIAGNOSIS — Z170 Estrogen receptor positive status [ER+]: Secondary | ICD-10-CM | POA: Diagnosis not present

## 2021-04-14 ENCOUNTER — Ambulatory Visit
Admission: RE | Admit: 2021-04-14 | Discharge: 2021-04-14 | Disposition: A | Discharge status: Home / Self Care | Payer: BC Managed Care – PPO | Source: Ambulatory Visit | Attending: Radiation Oncology | Admitting: Radiation Oncology

## 2021-04-14 DIAGNOSIS — C50211 Malignant neoplasm of upper-inner quadrant of right female breast: Secondary | ICD-10-CM | POA: Diagnosis not present

## 2021-04-14 DIAGNOSIS — Z170 Estrogen receptor positive status [ER+]: Secondary | ICD-10-CM | POA: Diagnosis not present

## 2021-04-14 DIAGNOSIS — Z510 Encounter for antineoplastic radiation therapy: Secondary | ICD-10-CM | POA: Diagnosis not present

## 2021-04-17 ENCOUNTER — Ambulatory Visit
Admission: RE | Admit: 2021-04-17 | Discharge: 2021-04-17 | Disposition: A | Discharge status: Home / Self Care | Payer: BC Managed Care – PPO | Source: Ambulatory Visit | Attending: Radiation Oncology | Admitting: Radiation Oncology

## 2021-04-17 DIAGNOSIS — Z170 Estrogen receptor positive status [ER+]: Secondary | ICD-10-CM | POA: Diagnosis not present

## 2021-04-17 DIAGNOSIS — C50211 Malignant neoplasm of upper-inner quadrant of right female breast: Secondary | ICD-10-CM | POA: Diagnosis not present

## 2021-04-17 DIAGNOSIS — Z510 Encounter for antineoplastic radiation therapy: Secondary | ICD-10-CM | POA: Diagnosis not present

## 2021-04-18 ENCOUNTER — Ambulatory Visit
Admission: RE | Admit: 2021-04-18 | Discharge: 2021-04-18 | Disposition: A | Discharge status: Home / Self Care | Payer: BC Managed Care – PPO | Source: Ambulatory Visit | Attending: Radiation Oncology | Admitting: Radiation Oncology

## 2021-04-18 DIAGNOSIS — Z170 Estrogen receptor positive status [ER+]: Secondary | ICD-10-CM | POA: Diagnosis not present

## 2021-04-18 DIAGNOSIS — Z510 Encounter for antineoplastic radiation therapy: Secondary | ICD-10-CM | POA: Diagnosis not present

## 2021-04-18 DIAGNOSIS — C50211 Malignant neoplasm of upper-inner quadrant of right female breast: Secondary | ICD-10-CM | POA: Diagnosis not present

## 2021-04-19 ENCOUNTER — Ambulatory Visit
Admission: RE | Admit: 2021-04-19 | Discharge: 2021-04-19 | Disposition: A | Discharge status: Home / Self Care | Payer: BC Managed Care – PPO | Source: Ambulatory Visit | Attending: Radiation Oncology | Admitting: Radiation Oncology

## 2021-04-19 DIAGNOSIS — Z510 Encounter for antineoplastic radiation therapy: Secondary | ICD-10-CM | POA: Diagnosis not present

## 2021-04-19 DIAGNOSIS — C50211 Malignant neoplasm of upper-inner quadrant of right female breast: Secondary | ICD-10-CM | POA: Diagnosis not present

## 2021-04-19 DIAGNOSIS — Z170 Estrogen receptor positive status [ER+]: Secondary | ICD-10-CM | POA: Diagnosis not present

## 2021-04-20 ENCOUNTER — Ambulatory Visit
Admission: RE | Admit: 2021-04-20 | Discharge: 2021-04-20 | Disposition: A | Discharge status: Home / Self Care | Payer: BC Managed Care – PPO | Source: Ambulatory Visit | Attending: Radiation Oncology | Admitting: Radiation Oncology

## 2021-04-20 DIAGNOSIS — Z170 Estrogen receptor positive status [ER+]: Secondary | ICD-10-CM | POA: Diagnosis not present

## 2021-04-20 DIAGNOSIS — Z510 Encounter for antineoplastic radiation therapy: Secondary | ICD-10-CM | POA: Diagnosis not present

## 2021-04-20 DIAGNOSIS — C50211 Malignant neoplasm of upper-inner quadrant of right female breast: Secondary | ICD-10-CM | POA: Diagnosis not present

## 2021-04-21 ENCOUNTER — Ambulatory Visit
Admission: RE | Admit: 2021-04-21 | Discharge: 2021-04-21 | Disposition: A | Discharge status: Home / Self Care | Payer: BC Managed Care – PPO | Source: Ambulatory Visit | Attending: Radiation Oncology | Admitting: Radiation Oncology

## 2021-04-21 DIAGNOSIS — Z170 Estrogen receptor positive status [ER+]: Secondary | ICD-10-CM | POA: Diagnosis not present

## 2021-04-21 DIAGNOSIS — Z510 Encounter for antineoplastic radiation therapy: Secondary | ICD-10-CM | POA: Diagnosis not present

## 2021-04-21 DIAGNOSIS — C50211 Malignant neoplasm of upper-inner quadrant of right female breast: Secondary | ICD-10-CM | POA: Diagnosis not present

## 2021-04-24 ENCOUNTER — Ambulatory Visit: Payer: BC Managed Care – PPO

## 2021-04-24 ENCOUNTER — Ambulatory Visit
Admission: RE | Admit: 2021-04-24 | Discharge: 2021-04-24 | Disposition: A | Discharge status: Home / Self Care | Payer: BC Managed Care – PPO | Source: Ambulatory Visit | Attending: Radiation Oncology | Admitting: Radiation Oncology

## 2021-04-24 DIAGNOSIS — Z510 Encounter for antineoplastic radiation therapy: Secondary | ICD-10-CM | POA: Diagnosis not present

## 2021-04-24 DIAGNOSIS — C50211 Malignant neoplasm of upper-inner quadrant of right female breast: Secondary | ICD-10-CM | POA: Diagnosis not present

## 2021-04-24 DIAGNOSIS — Z170 Estrogen receptor positive status [ER+]: Secondary | ICD-10-CM | POA: Diagnosis not present

## 2021-04-25 ENCOUNTER — Ambulatory Visit: Payer: BC Managed Care – PPO

## 2021-04-25 ENCOUNTER — Ambulatory Visit
Admission: RE | Admit: 2021-04-25 | Discharge: 2021-04-25 | Disposition: A | Discharge status: Home / Self Care | Payer: BC Managed Care – PPO | Source: Ambulatory Visit | Attending: Radiation Oncology | Admitting: Radiation Oncology

## 2021-04-25 DIAGNOSIS — Z510 Encounter for antineoplastic radiation therapy: Secondary | ICD-10-CM | POA: Diagnosis not present

## 2021-04-25 DIAGNOSIS — Z170 Estrogen receptor positive status [ER+]: Secondary | ICD-10-CM | POA: Diagnosis not present

## 2021-04-25 DIAGNOSIS — C50211 Malignant neoplasm of upper-inner quadrant of right female breast: Secondary | ICD-10-CM | POA: Diagnosis not present

## 2021-04-26 ENCOUNTER — Inpatient Hospital Stay: Payer: BC Managed Care – PPO

## 2021-04-26 ENCOUNTER — Ambulatory Visit
Admission: RE | Admit: 2021-04-26 | Discharge: 2021-04-26 | Disposition: A | Discharge status: Home / Self Care | Payer: BC Managed Care – PPO | Source: Ambulatory Visit | Attending: Radiation Oncology | Admitting: Radiation Oncology

## 2021-04-26 ENCOUNTER — Other Ambulatory Visit: Payer: Self-pay

## 2021-04-26 DIAGNOSIS — Z510 Encounter for antineoplastic radiation therapy: Secondary | ICD-10-CM | POA: Diagnosis not present

## 2021-04-26 DIAGNOSIS — Z7984 Long term (current) use of oral hypoglycemic drugs: Secondary | ICD-10-CM | POA: Diagnosis not present

## 2021-04-26 DIAGNOSIS — E876 Hypokalemia: Secondary | ICD-10-CM | POA: Diagnosis not present

## 2021-04-26 DIAGNOSIS — Z170 Estrogen receptor positive status [ER+]: Secondary | ICD-10-CM | POA: Diagnosis not present

## 2021-04-26 DIAGNOSIS — E785 Hyperlipidemia, unspecified: Secondary | ICD-10-CM | POA: Diagnosis not present

## 2021-04-26 DIAGNOSIS — G4733 Obstructive sleep apnea (adult) (pediatric): Secondary | ICD-10-CM | POA: Diagnosis not present

## 2021-04-26 DIAGNOSIS — Z79899 Other long term (current) drug therapy: Secondary | ICD-10-CM | POA: Diagnosis not present

## 2021-04-26 DIAGNOSIS — E039 Hypothyroidism, unspecified: Secondary | ICD-10-CM | POA: Diagnosis not present

## 2021-04-26 DIAGNOSIS — C50211 Malignant neoplasm of upper-inner quadrant of right female breast: Secondary | ICD-10-CM | POA: Diagnosis not present

## 2021-04-26 DIAGNOSIS — Z7982 Long term (current) use of aspirin: Secondary | ICD-10-CM | POA: Diagnosis not present

## 2021-04-26 DIAGNOSIS — I10 Essential (primary) hypertension: Secondary | ICD-10-CM | POA: Diagnosis not present

## 2021-04-26 DIAGNOSIS — E119 Type 2 diabetes mellitus without complications: Secondary | ICD-10-CM | POA: Diagnosis not present

## 2021-04-26 LAB — CBC
HCT: 42.2 % (ref 36.0–46.0)
Hemoglobin: 14 g/dL (ref 12.0–15.0)
MCH: 27 pg (ref 26.0–34.0)
MCHC: 33.2 g/dL (ref 30.0–36.0)
MCV: 81.3 fL (ref 80.0–100.0)
Platelets: 346 10*3/uL (ref 150–400)
RBC: 5.19 MIL/uL — ABNORMAL HIGH (ref 3.87–5.11)
RDW: 14.3 % (ref 11.5–15.5)
WBC: 3.5 10*3/uL — ABNORMAL LOW (ref 4.0–10.5)
nRBC: 0 % (ref 0.0–0.2)

## 2021-04-27 ENCOUNTER — Other Ambulatory Visit: Payer: Self-pay | Admitting: Family Medicine

## 2021-04-27 ENCOUNTER — Ambulatory Visit
Admission: RE | Admit: 2021-04-27 | Discharge: 2021-04-27 | Disposition: A | Discharge status: Home / Self Care | Payer: BC Managed Care – PPO | Source: Ambulatory Visit | Attending: Radiation Oncology | Admitting: Radiation Oncology

## 2021-04-27 DIAGNOSIS — Z510 Encounter for antineoplastic radiation therapy: Secondary | ICD-10-CM | POA: Diagnosis not present

## 2021-04-27 DIAGNOSIS — C50211 Malignant neoplasm of upper-inner quadrant of right female breast: Secondary | ICD-10-CM | POA: Diagnosis not present

## 2021-04-27 DIAGNOSIS — Z170 Estrogen receptor positive status [ER+]: Secondary | ICD-10-CM | POA: Diagnosis not present

## 2021-04-28 ENCOUNTER — Ambulatory Visit
Admission: RE | Admit: 2021-04-28 | Discharge: 2021-04-28 | Disposition: A | Discharge status: Home / Self Care | Payer: BC Managed Care – PPO | Source: Ambulatory Visit | Attending: Radiation Oncology | Admitting: Radiation Oncology

## 2021-04-28 DIAGNOSIS — Z510 Encounter for antineoplastic radiation therapy: Secondary | ICD-10-CM | POA: Diagnosis not present

## 2021-04-28 DIAGNOSIS — C50211 Malignant neoplasm of upper-inner quadrant of right female breast: Secondary | ICD-10-CM | POA: Diagnosis not present

## 2021-04-28 DIAGNOSIS — Z170 Estrogen receptor positive status [ER+]: Secondary | ICD-10-CM | POA: Diagnosis not present

## 2021-05-01 ENCOUNTER — Ambulatory Visit
Admission: RE | Admit: 2021-05-01 | Discharge: 2021-05-01 | Disposition: A | Discharge status: Home / Self Care | Payer: BC Managed Care – PPO | Source: Ambulatory Visit | Attending: Radiation Oncology | Admitting: Radiation Oncology

## 2021-05-01 DIAGNOSIS — Z170 Estrogen receptor positive status [ER+]: Secondary | ICD-10-CM | POA: Diagnosis not present

## 2021-05-01 DIAGNOSIS — C50211 Malignant neoplasm of upper-inner quadrant of right female breast: Secondary | ICD-10-CM | POA: Diagnosis not present

## 2021-05-01 DIAGNOSIS — Z510 Encounter for antineoplastic radiation therapy: Secondary | ICD-10-CM | POA: Diagnosis not present

## 2021-05-02 ENCOUNTER — Ambulatory Visit
Admission: RE | Admit: 2021-05-02 | Discharge: 2021-05-02 | Disposition: A | Discharge status: Home / Self Care | Payer: BC Managed Care – PPO | Source: Ambulatory Visit | Attending: Radiation Oncology | Admitting: Radiation Oncology

## 2021-05-02 DIAGNOSIS — Z170 Estrogen receptor positive status [ER+]: Secondary | ICD-10-CM | POA: Diagnosis not present

## 2021-05-02 DIAGNOSIS — Z510 Encounter for antineoplastic radiation therapy: Secondary | ICD-10-CM | POA: Diagnosis not present

## 2021-05-02 DIAGNOSIS — C50211 Malignant neoplasm of upper-inner quadrant of right female breast: Secondary | ICD-10-CM | POA: Diagnosis not present

## 2021-05-03 ENCOUNTER — Ambulatory Visit
Admission: RE | Admit: 2021-05-03 | Discharge: 2021-05-03 | Disposition: A | Discharge status: Home / Self Care | Payer: BC Managed Care – PPO | Source: Ambulatory Visit | Attending: Radiation Oncology | Admitting: Radiation Oncology

## 2021-05-03 ENCOUNTER — Ambulatory Visit: Payer: BC Managed Care – PPO

## 2021-05-03 DIAGNOSIS — C50211 Malignant neoplasm of upper-inner quadrant of right female breast: Secondary | ICD-10-CM | POA: Diagnosis not present

## 2021-05-03 DIAGNOSIS — Z510 Encounter for antineoplastic radiation therapy: Secondary | ICD-10-CM | POA: Diagnosis not present

## 2021-05-03 DIAGNOSIS — Z170 Estrogen receptor positive status [ER+]: Secondary | ICD-10-CM | POA: Diagnosis not present

## 2021-05-04 ENCOUNTER — Ambulatory Visit: Payer: BC Managed Care – PPO

## 2021-05-09 ENCOUNTER — Inpatient Hospital Stay: Payer: BC Managed Care – PPO

## 2021-05-09 ENCOUNTER — Inpatient Hospital Stay (HOSPITAL_BASED_OUTPATIENT_CLINIC_OR_DEPARTMENT_OTHER): Payer: BC Managed Care – PPO | Admitting: Oncology

## 2021-05-09 ENCOUNTER — Encounter: Payer: Self-pay | Admitting: Oncology

## 2021-05-09 VITALS — BP 139/71 | HR 72 | Temp 97.4°F | Resp 17 | Wt 218.9 lb

## 2021-05-09 DIAGNOSIS — I10 Essential (primary) hypertension: Secondary | ICD-10-CM | POA: Diagnosis not present

## 2021-05-09 DIAGNOSIS — Z79899 Other long term (current) drug therapy: Secondary | ICD-10-CM | POA: Diagnosis not present

## 2021-05-09 DIAGNOSIS — G4733 Obstructive sleep apnea (adult) (pediatric): Secondary | ICD-10-CM | POA: Diagnosis not present

## 2021-05-09 DIAGNOSIS — E785 Hyperlipidemia, unspecified: Secondary | ICD-10-CM | POA: Diagnosis not present

## 2021-05-09 DIAGNOSIS — C50211 Malignant neoplasm of upper-inner quadrant of right female breast: Secondary | ICD-10-CM

## 2021-05-09 DIAGNOSIS — Z170 Estrogen receptor positive status [ER+]: Secondary | ICD-10-CM | POA: Diagnosis not present

## 2021-05-09 DIAGNOSIS — E039 Hypothyroidism, unspecified: Secondary | ICD-10-CM | POA: Diagnosis not present

## 2021-05-09 DIAGNOSIS — E876 Hypokalemia: Secondary | ICD-10-CM | POA: Diagnosis not present

## 2021-05-09 DIAGNOSIS — Z79811 Long term (current) use of aromatase inhibitors: Secondary | ICD-10-CM

## 2021-05-09 DIAGNOSIS — E119 Type 2 diabetes mellitus without complications: Secondary | ICD-10-CM | POA: Diagnosis not present

## 2021-05-09 DIAGNOSIS — Z7984 Long term (current) use of oral hypoglycemic drugs: Secondary | ICD-10-CM | POA: Diagnosis not present

## 2021-05-09 DIAGNOSIS — Z7982 Long term (current) use of aspirin: Secondary | ICD-10-CM | POA: Diagnosis not present

## 2021-05-09 LAB — CBC WITH DIFFERENTIAL/PLATELET
Abs Immature Granulocytes: 0.01 10*3/uL (ref 0.00–0.07)
Basophils Absolute: 0 10*3/uL (ref 0.0–0.1)
Basophils Relative: 1 %
Eosinophils Absolute: 0.2 10*3/uL (ref 0.0–0.5)
Eosinophils Relative: 6 %
HCT: 39.4 % (ref 36.0–46.0)
Hemoglobin: 12.9 g/dL (ref 12.0–15.0)
Immature Granulocytes: 0 %
Lymphocytes Relative: 34 %
Lymphs Abs: 1.3 10*3/uL (ref 0.7–4.0)
MCH: 26.9 pg (ref 26.0–34.0)
MCHC: 32.7 g/dL (ref 30.0–36.0)
MCV: 82.3 fL (ref 80.0–100.0)
Monocytes Absolute: 0.4 10*3/uL (ref 0.1–1.0)
Monocytes Relative: 11 %
Neutro Abs: 1.8 10*3/uL (ref 1.7–7.7)
Neutrophils Relative %: 48 %
Platelets: 278 10*3/uL (ref 150–400)
RBC: 4.79 MIL/uL (ref 3.87–5.11)
RDW: 14.7 % (ref 11.5–15.5)
WBC: 3.8 10*3/uL — ABNORMAL LOW (ref 4.0–10.5)
nRBC: 0 % (ref 0.0–0.2)

## 2021-05-09 LAB — COMPREHENSIVE METABOLIC PANEL
ALT: 16 U/L (ref 0–44)
AST: 18 U/L (ref 15–41)
Albumin: 3.9 g/dL (ref 3.5–5.0)
Alkaline Phosphatase: 43 U/L (ref 38–126)
Anion gap: 9 (ref 5–15)
BUN: 18 mg/dL (ref 8–23)
CO2: 29 mmol/L (ref 22–32)
Calcium: 8.8 mg/dL — ABNORMAL LOW (ref 8.9–10.3)
Chloride: 99 mmol/L (ref 98–111)
Creatinine, Ser: 1.12 mg/dL — ABNORMAL HIGH (ref 0.44–1.00)
GFR, Estimated: 55 mL/min — ABNORMAL LOW (ref >60)
Glucose, Bld: 128 mg/dL — ABNORMAL HIGH (ref 70–99)
Potassium: 3.4 mmol/L — ABNORMAL LOW (ref 3.5–5.1)
Sodium: 137 mmol/L (ref 135–145)
Total Bilirubin: 0.5 mg/dL (ref 0.3–1.2)
Total Protein: 7 g/dL (ref 6.5–8.1)

## 2021-05-09 MED ORDER — ANASTROZOLE 1 MG PO TABS: 1.0000 mg | ORAL_TABLET | Freq: Every day | ORAL | 1 refills | Status: DC

## 2021-05-09 NOTE — Progress Notes (Addendum)
Hematology/Oncology Consult note [facility] [phone] [phone]   Patient Care Team: Steele Sizer, MD as PCP - General [name], MD as PCP - Cardiology (Cardiology) Rico Junker, RN as Oncology Nurse Navigator  REFERRING PROVIDER: Steele Sizer, MD  CHIEF COMPLAINTS/REASON FOR VISIT:  Follow up of breast cancer.   HISTORY OF PRESENTING ILLNESS:   Andrea Randolph is a  65 y.o.  female with PMH listed below was seen in consultation at the request of  Steele Sizer, MD  for evaluation of breast cancer.   01/12/21 screening mammogram showed focal asymmetry with possible distortion.  01/26/21 right diagnostic mammogram and US showed 3x5x28m hypoechoic mass, s/p biopsy on 02/07/21 Pathology showed invasive mammary carcinoma, grade 2, ER90% PR 90%, HER2 negative.   Patient was seen by Dr.Byrnett yesterday. She presents to establish care with oncology Accompanied by her daughter  Menarche 89years with Age at first childbirth 89  OCP use for about 20 years Denies any estrogen replacement. LMP in 584s Denies any previous chest radiation. History of left breast biopsy previously. Family history Father for maternal aunt who was diagnosed of breast cancer at age of 53  03/06/2021 lumpectomy and sentinel lymph node biopsy. Pathology showed invasive mammary carcinoma, no special type, DCIS in situ, intermediate grade.  All 3 sentinel lymph nodes were negative.  1 nonsentinel lymph node was negative.  Margins for invasive carcinoma was negative.  Margins for DCIS was unifocal positive. Patient declined reexcision.Patient's case was discussed on breast cancer tumor board on 03/20/2021.   INTERVAL HISTORY MSENTA KANTORis a 65y.o. female who has above history reviewed by me today presents for follow up visit for management of breast cancer Problems and complaints are listed below:  05/03/2021 finished adjuvant radiation.  Overall  she tolerates well.   Review of Systems  Constitutional:  Negative for appetite change, chills, fatigue and fever.  HENT:   Negative for hearing loss and voice change.   Eyes:  Negative for eye problems.  Respiratory:  Negative for chest tightness and cough.   Cardiovascular:  Negative for chest pain.  Gastrointestinal:  Negative for abdominal distention, abdominal pain and blood in stool.  Endocrine: Negative for hot flashes.  Genitourinary:  Negative for difficulty urinating and frequency.   Musculoskeletal:  Negative for arthralgias.  Skin:  Negative for itching and rash.  Neurological:  Negative for extremity weakness.  Hematological:  Negative for adenopathy.  Psychiatric/Behavioral:  Negative for confusion. The patient is not nervous/anxious.    MEDICAL HISTORY:  Past Medical History:  Diagnosis Date   Allergy    Anemia    Arthritis of right shoulder region    Dr. STamala Julian  Chronic insomnia    Diabetes mellitus without complication ([place]    Family history of adverse reaction to anesthesia    sister with Nausea   GERD (gastroesophageal reflux disease)    Goiter    Dr. SGabriel Carina  Heart murmur    Hyperlipidemia    Hypertension    Hypertensive retinopathy of left eye    Hypothyroidism, adult    Dr. SGabriel Carina  Inflammation of urethra    Menopause    Microalbuminuria    DM   Mixed incontinence    Muscle cramps    Obesity    OSA (obstructive sleep apnea)    Radiculitis, lumbosacral    Vitamin D deficiency     SURGICAL HISTORY: Past Surgical History:  Procedure Laterality Date  BREAST BIOPSY Left 90's   benign core needle    BREAST BIOPSY Right 02/07/2021   [place] Bx, Q-clip, path pending    BREAST LUMPECTOMY WITH SENTINEL LYMPH NODE BIOPSY Right 03/06/2021   Procedure: BREAST LUMPECTOMY WITH SENTINEL LYMPH NODE BX;  Surgeon: Robert Bellow, MD;  Location: [facility] ORS;  Service: General;  Laterality: Right;   COLONOSCOPY WITH PROPOFOL N/A 01/26/2019   Procedure:  COLONOSCOPY WITH PROPOFOL;  Surgeon: Lin Landsman, MD;  Location: [facility] ENDOSCOPY;  Service: Gastroenterology;  Laterality: N/A;   FOOT SURGERY Bilateral    heel spurs   JOINT REPLACEMENT     TOTAL SHOULDER REPLACEMENT Right 01/25/2015   Dr. Roland Rack   TOTAL THYROIDECTOMY  08/01/2018    SOCIAL HISTORY: Social History   Socioeconomic History   Marital status: Legally Separated    Spouse name: Not on file   Number of children: 1   Years of education: Not on file   Highest education level: 12th grade  Occupational History   Occupation: Cytogeneticist  Tobacco Use   Smoking status: Never   Smokeless tobacco: Never  Vaping Use   Vaping Use: Never used  Substance and Sexual Activity   Alcohol use: No    Alcohol/week: 0.0 standard drinks   Drug use: No   Sexual activity: Not Currently    Birth control/protection: Post-menopausal  Other Topics Concern   Not on file  Social History Narrative   Separated in around 2009.   She was living with her daughter, however currently moved in with her sister and nephew , but is looking for a house.    Social Determinants of Health   Financial Resource Strain: Low Risk    Difficulty of Paying Living Expenses: Not hard at all  Food Insecurity: No Food Insecurity   Worried About Charity fundraiser in the Last Year: Never true   [place] in the Last Year: Never true  Transportation Needs: No Transportation Needs   Lack of Transportation (Medical): No   Lack of Transportation (Non-Medical): No  Physical Activity: Sufficiently Active   Days of Exercise per Week: 3 days   Minutes of Exercise per Session: 60 min  Stress: No Stress Concern Present   Feeling of Stress : Only a little  Social Connections: Moderately Integrated   Frequency of Communication with Friends and Family: More than three times a week   Frequency of Social Gatherings with Friends and Family: More than three times a week   Attends Religious Services: More  than 4 times per year   Active Member of [employer] or Organizations: Yes   Attends Music therapist: More than 4 times per year   Marital Status: Separated  Intimate Partner Violence: Not At Risk   Fear of Current or Ex-Partner: No   Emotionally Abused: No   Physically Abused: No   Sexually Abused: No    FAMILY HISTORY: Family History  Problem Relation Age of Onset   Heart disease Mother    Heart disease Father    Kidney disease Sister    Hypertension Sister    Diabetes Sister    Hypertension Brother    CVA Brother    Breast cancer Maternal Aunt 28   Hypertension Sister    Kidney failure Sister    Ovarian cancer Neg Hx    Colon cancer Neg Hx     ALLERGIES:  is allergic to rosuvastatin.  MEDICATIONS:  Current Outpatient Medications  Medication Sig Dispense  Refill   anastrozole (ARIMIDEX) 1 MG tablet Take 1 tablet (1 mg total) by mouth daily. 90 tablet 1   acetaminophen (TYLENOL) 325 MG tablet Take by mouth.     aspirin 81 MG tablet Take 81 mg by mouth daily.     Cholecalciferol (VITAMIN D3) 1000 units CAPS Take 1,000 Units by mouth daily.     diclofenac Sodium (VOLTAREN) 1 % GEL Apply 2 g topically 4 (four) times daily. (Patient taking differently: Apply 2 g topically 4 (four) times daily as needed.) 300 g 0   ezetimibe (ZETIA) 10 MG tablet TAKE 1 TABLET DAILY (Patient taking differently: Take 10 mg by mouth daily.) 90 tablet 1   FARXIGA 10 MG TABS tablet TAKE 1 TABLET BY MOUTH ONCE DAILY BEFORE BREAKFAST IN PLACE OF XIGDUO. 90 tablet 0   glucose blood test strip FREESTYLE LITE TEST (In Vitro Strip)  use as directed for 0 days  Quantity: 200.00;  Refills: 3   Ordered :25-Nov-2014  Vonna Kotyk ;  Started 30-Dec-2008 Active Comments: DX: 250.00     HYDROcodone-acetaminophen (NORCO/VICODIN) 5-325 MG tablet Take 1 tablet by mouth every 4 (four) hours as needed for moderate pain. (Patient not taking: Reported on 03/20/2021) 12 tablet 0   ketoconazole (NIZORAL) 2  % cream Apply 1 application topically daily. (Patient taking differently: Apply 1 application topically daily as needed for irritation.) 60 g 0   levothyroxine (SYNTHROID) 125 MCG tablet Take 1 tablet (125 mcg total) by mouth daily. 90 tablet 1   lidocaine-prilocaine (EMLA) cream Apply to areola one hour prior to arrival day of procedure. Cover with saran wrap.     Olmesartan-amLODIPine-HCTZ 40-5-25 MG TABS TAKE 1 TABLET DAILY (SCHEDULE APPOINTMENT FOR FURTHER REFILLS) (Patient taking differently: Take 1 tablet by mouth daily.) 90 tablet 3   pravastatin (PRAVACHOL) 20 MG tablet TAKE 1 TABLET BY MOUTH ONCE DAILY IN THE EVENING (Patient taking differently: Take 20 mg by mouth every evening.) 30 tablet 5   Semaglutide (RYBELSUS) 7 MG TABS Take 1 tablet by mouth daily. (Patient taking differently: Take 7 mg by mouth daily.) 90 tablet 1   No current facility-administered medications for this visit.     PHYSICAL EXAMINATION: ECOG PERFORMANCE STATUS: 0 - Asymptomatic Vitals:   05/09/21 1403  BP: 139/71  Pulse: 72  Resp: 17  Temp: (!) 97.4 F (36.3 C)   Filed Weights   05/09/21 1403  Weight: 218 lb 14.4 oz (99.3 kg)    Physical Exam Constitutional:      General: She is not in acute distress. HENT:     Head: Normocephalic and atraumatic.  Eyes:     General: No scleral icterus. Cardiovascular:     Rate and Rhythm: Normal rate and regular rhythm.     Heart sounds: Normal heart sounds.  Pulmonary:     Effort: Pulmonary effort is normal. No respiratory distress.     Breath sounds: No wheezing.  Abdominal:     General: Bowel sounds are normal. There is no distension.     Palpations: Abdomen is soft.  Musculoskeletal:        General: No deformity. Normal range of motion.     Cervical back: Normal range of motion and neck supple.  Skin:    General: Skin is warm and dry.     Findings: No erythema or rash.  Neurological:     Mental Status: She is alert and oriented to person, place,  and time. Mental status is at baseline.  Cranial Nerves: No cranial nerve deficit.     Coordination: Coordination normal.  Psychiatric:        Mood and Affect: Mood normal.    LABORATORY DATA:  I have reviewed the data as listed Lab Results  Component Value Date   WBC 3.8 (L) 05/09/2021   HGB 12.9 05/09/2021   HCT 39.4 05/09/2021   MCV 82.3 05/09/2021   PLT 278 05/09/2021   Recent Labs    06/20/20 0941 10/20/20 1045 01/26/21 0949 05/09/21 1351  NA 140 143 141 137  K 3.7 4.0 4.1 3.4*  CL 103 105 103 99  CO2 _0 GLUCOSE 89 85 92 128*  BUN _1 CREATININE 1.13* 1.04* 1.15* 1.12*  CALCIUM 9.3 9.8 9.7 8.8*  GFRNONAA 52* 57* 51* 55*  GFRAA 60 66 59*  --   PROT 6.8 7.1 7.0 7.0  ALBUMIN  --   --   --  3.9  AST _2 ALT _3 ALKPHOS  --   --   --  43  BILITOT 0.4 0.4 0.5 0.5    Iron/TIBC/Ferritin/ %Sat    Component Value Date/Time   FERRITIN 27 08/29/2015 1135      RADIOGRAPHIC STUDIES: I have personally reviewed the radiological images as listed and agreed with the findings in the report. No results found.     ASSESSMENT & PLAN:  1. Malignant 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, estrogen receptor positive ([place])   2. Aromatase inhibitor use   3. Hypokalemia   Cancer Staging Malignant 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, estrogen receptor positive ([place]) Staging form: Breast, AJCC 8th Edition - Pathologic stage from 03/20/2021: Stage IA (pT1a, pN0, cM0, G2, ER+, PR+, HER2-) - Signed by Earlie Server, MD on 03/20/2021   Right breast invasive carcinoma.  pT1a pN0, ER positive, PR positive, HER2 negative. Focal DCIS margin positive.Patient declined reexcision.s/p adjuvant radiation.  Rationale of using aromatase inhibitor -Arimidex  discussed with patient.  Side effects of Arimidex including but not limited to hot flush, joint pain, fatigue, mood swing, weight gain, osteoporosis discussed  with patient. Patient voices understanding and willing to proceed.  Rx was sent pharmacy Recommend calcium 1246m daily.  Check baseline DEXA  Hypokalemia, mild. Recommend patient to increase potassium rich food.  Orders Placed This Encounter  Procedures   DG Bone Density    Standing Status:   Future    Standing Expiration Date:   05/09/2022    Order Specific Question:   Reason for Exam (SYMPTOM  OR DIAGNOSIS REQUIRED)    Answer:   Baseline prior to starting aromatase inhibitor, breast cancer    Order Specific Question:   Preferred imaging location?    Answer:   [facility]   CBC with Differential/Platelet    Standing Status:   Future    Standing Expiration Date:   05/09/2022   Comprehensive metabolic panel    Standing Status:   Future    Standing Expiration Date:   05/09/2022    All questions were answered. The patient knows to call the clinic with any problems questions or concerns.  cc SSteele Sizer MD    Return of visit: 8 weeks.    ZEarlie Server MD, PhD Hematology Oncology CSurg[facility]at ACedar City HospitalPager- [phone]/28/2022

## 2021-05-12 ENCOUNTER — Other Ambulatory Visit: Payer: Self-pay | Admitting: Family Medicine

## 2021-05-12 DIAGNOSIS — E1122 Type 2 diabetes mellitus with diabetic chronic kidney disease: Secondary | ICD-10-CM

## 2021-05-16 ENCOUNTER — Ambulatory Visit
Admission: RE | Admit: 2021-05-16 | Discharge: 2021-05-16 | Disposition: A | Discharge status: Home / Self Care | Payer: BC Managed Care – PPO | Source: Ambulatory Visit | Attending: Oncology | Admitting: Oncology

## 2021-05-16 ENCOUNTER — Other Ambulatory Visit: Payer: Self-pay

## 2021-05-16 DIAGNOSIS — Z170 Estrogen receptor positive status [ER+]: Secondary | ICD-10-CM

## 2021-05-16 DIAGNOSIS — Z780 Asymptomatic menopausal state: Secondary | ICD-10-CM | POA: Diagnosis not present

## 2021-05-16 DIAGNOSIS — C50211 Malignant neoplasm of upper-inner quadrant of right female breast: Secondary | ICD-10-CM | POA: Diagnosis not present

## 2021-05-26 NOTE — Progress Notes (Signed)
Name: Andrea Randolph   MRN: [id number]    DOB: 1956/04/21   Date:05/29/2021       Progress Note  Subjective  Chief Complaint  Follow Up  HPI  DMII: she is currently taking Farxiga and Rybelsus, off Metformin .  She eats breakfast but it is a light meal. A1C is still under control, today at 5.9 % She has dyslipidemia, HTN and obesity. She is on ARB, pravastatin and Zetia , but not compliant with statin or Zetia at this time, advised to take Zetia daily and continue pravastatin a couple of times a week.  She denies polyphagia, polydipsia or polyuria   LVH and aortic stenosis: under care of Dr. Rockey Situ, no chest pain, palpitation or sob . Unchanged   Vitamin D low: she is now taking 2000 units daily    Surgical hypothyroidism: TSH was suppressed 10/2020 0.20  but last level back to normal, continue levohtyroxine 125 mcg daily , she was advised to skip on Sundays but she forgets and takes it anyway.   Morbid obesity: she states not feeling well since she started on Arimdex, craving chocolate and gaining weight, frustrated    OA/Knee right side is the worse : not daily, she states Voltaren not covered by insurance, taking prn Tylenol I will [facility]ren [facility]    Hyperlipidemia: on Pravastatin  and Zetia but not very compliant .  LDL still not at goal, cannot tolerate Crestor, taking pravastatin and Zetia only a couple of times a week, advised to take Zetia daily and pravastatin occasionally, having a lot of muscle pain, we will check CK today    OSA: she does not tolerate CPAP machine. Still not wearing it , unchanged   Breast Cancer right : diagnosed by abnormal mammogram back in March, lumpectomy was done 03/06/2021 followed by 5 weeks of radiation and finished 05/03/2021. She was doing well until she started on Arimidex, she states her entire body hurts, joint are stiff and aching, difficulty sleeping, change in bowel movement, increase in appetite. Advised to contact Dr. Tasia Catchings to see if  any other options available for her  Patient Active Problem List   Diagnosis Date Noted   Malignant 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, estrogen receptor positive ([place]) 02/20/2021   Thrombocytosis 11/01/2019   Carpal tunnel syndrome, bilateral 02/19/2019   Trigger finger of right thumb 02/19/2019   Flexor tenosynovitis of finger 02/19/2019   Severe obesity (BMI 35.0-35.9 with comorbidity) ([place]) 12/29/2018   Mild concentric left ventricular hypertrophy (LVH) 12/24/2017   Mild aortic stenosis by prior echocardiogram 12/24/2017   Mild aortic regurgitation 12/24/2017   CKD (chronic kidney disease), stage I 06/22/2016   Uterine fibroid 01/11/2016   Allergic rhinitis, seasonal 05/24/2015   Benign essential HTN 05/24/2015   Chronic constipation 05/24/2015   Diabetes mellitus with renal manifestation ([place]) 05/24/2015   Dyslipidemia 05/24/2015   Post-surgical hypothyroidism 05/24/2015   Mixed incontinence 05/24/2015   Microalbuminuria 05/24/2015   Menopause 05/24/2015   Obstructive apnea 05/24/2015   Morbid obesity ([place]) 05/24/2015   Hypertensive retinopathy 05/24/2015   Vitamin D deficiency 05/24/2015   Status post total replacement of right shoulder 03/11/2015    Past Surgical History:  Procedure Laterality Date   BREAST BIOPSY Left 90's   benign core needle    BREAST BIOPSY Right 02/07/2021   [place] Bx, Q-clip, path pending    BREAST LUMPECTOMY WITH SENTINEL LYMPH NODE BIOPSY Right 03/06/2021   Procedure: BREAST LUMPECTOMY  WITH SENTINEL LYMPH NODE BX;  Surgeon: Robert Bellow, MD;  Location: [facility];  Service: General;  Laterality: Right;   COLONOSCOPY WITH PROPOFOL N/A 01/26/2019   Procedure: COLONOSCOPY WITH PROPOFOL;  Surgeon: Lin Landsman, MD;  Location: [facility];  Service: Gastroenterology;  Laterality: N/A;   FOOT SURGERY Bilateral    heel spurs   JOINT REPLACEMENT     TOTAL SHOULDER REPLACEMENT Right 01/25/2015   Dr. Roland Rack   TOTAL  THYROIDECTOMY  08/01/2018    Family History  Problem Relation Age of Onset   Heart disease Mother    Heart disease Father    Kidney disease Sister    Hypertension Sister    Diabetes Sister    Hypertension Brother    CVA Brother    Breast cancer Maternal Aunt 108   Hypertension Sister    Kidney failure Sister    Ovarian cancer Neg Hx    Colon cancer Neg Hx     Social History   Tobacco Use   Smoking status: Never   Smokeless tobacco: Never  Substance Use Topics   Alcohol use: No    Alcohol/week: 0.0 standard drinks     Current Outpatient Medications:    aspirin 81 MG tablet, Take 81 mg by mouth daily., Disp: , Rfl:    Cholecalciferol (VITAMIN D3) 1000 units CAPS, Take 1,000 Units by mouth daily., Disp: , Rfl:    dapagliflozin propanediol (FARXIGA) 10 MG TABS tablet, TAKE 1 TABLET BY MOUTH ONCE DAILY BEFORE BREAKFAST IN PLACE OF XIGDUO., Disp: 90 tablet, Rfl: 0   diclofenac Sodium (VOLTAREN) 1 % GEL, Apply 2 g topically 4 (four) times daily as needed., Disp: 100 g, Rfl: 2   ezetimibe (ZETIA) 10 MG tablet, TAKE 1 TABLET DAILY (Patient taking differently: Take 10 mg by mouth daily.), Disp: 90 tablet, Rfl: 1   glucose blood test strip, FREESTYLE LITE TEST (In Vitro Strip)  use as directed for 0 days  Quantity: 200.00;  Refills: 3   Ordered :25-Nov-2014  Vonna Kotyk ;  Started 30-Dec-2008 Active Comments: DX: 250.00, Disp: , Rfl:    ketoconazole (NIZORAL) 2 % cream, Apply 1 application topically daily. (Patient taking differently: Apply 1 application topically daily as needed for irritation.), Disp: 60 g, Rfl: 0   levothyroxine (SYNTHROID) 125 MCG tablet, Take 1 tablet (125 mcg total) by mouth daily., Disp: 90 tablet, Rfl: 1   Olmesartan-amLODIPine-HCTZ 40-5-25 MG TABS, TAKE 1 TABLET DAILY (SCHEDULE APPOINTMENT FOR FURTHER REFILLS) (Patient taking differently: Take 1 tablet by mouth daily.), Disp: 90 tablet, Rfl: 3   pravastatin (PRAVACHOL) 20 MG tablet, TAKE 1 TABLET BY MOUTH  ONCE DAILY IN THE EVENING (Patient taking differently: Take 20 mg by mouth every evening.), Disp: 30 tablet, Rfl: 5   Semaglutide (RYBELSUS) 7 MG TABS, Take 1 tablet by mouth daily., Disp: 90 tablet, Rfl: 1   anastrozole (ARIMIDEX) 1 MG tablet, Take 1 tablet (1 mg total) by mouth daily. (Patient not taking: Reported on 05/29/2021), Disp: 90 tablet, Rfl: 1  Allergies  Allergen Reactions   Rosuvastatin Other (See Comments)    Myalgia     I personally reviewed active problem list, medication list, allergies, family history, social history, health maintenance with the patient/caregiver today.   ROS  Constitutional: Negative for fever or weight change.  Respiratory: Negative for cough and shortness of breath.   Cardiovascular: Negative for chest pain or palpitations.  Gastrointestinal: Negative for abdominal pain, no bowel changes.  Musculoskeletal: Negative for gait problem  or joint swelling. Muscle aches  Skin: Negative for rash.  Neurological: Negative for dizziness or headache.  No other specific complaints in a complete review of systems (except as listed in HPI above).   Objective   Vitals:   05/29/21 0915  BP: 132/82  Pulse: 87  Resp: 16  Temp: 98.4 F (36.9 C)  TempSrc: Oral  SpO2: 96%  Weight: 218 lb (98.9 kg)  Height: 5' 4"  (1.626 m)    Body mass index is 37.42 kg/m.  Physical Exam  Constitutional: Patient appears well-developed and well-nourished. Obese  No distress.  HEENT: head atraumatic, normocephalic, pupils equal and reactive to light,  neck supple Cardiovascular: Normal rate, regular rhythm and normal heart sounds.  2/6  murmur heard. Trace  BLE edema. Pulmonary/Chest: Effort normal and breath sounds normal. No respiratory distress. Abdominal: Soft.  There is no tenderness. Psychiatric: Patient has a normal mood and affect. behavior is normal. Judgment and thought content normal.   Recent Results (from the past 2160 hour(s))  SARS CORONAVIRUS 2 (TAT  6-24 HRS) Nasopharyngeal Nasopharyngeal Swab     Status: None   Collection Time: 03/02/21  8:14 AM   Specimen: Nasopharyngeal Swab  Result Value Ref Range   SARS Coronavirus 2 NEGATIVE NEGATIVE    Comment: (NOTE) SARS-CoV-2 target nucleic acids are NOT DETECTED.  The SARS-CoV-2 RNA is generally detectable in upper and lower respiratory specimens during the acute phase of infection. Negative results do not preclude SARS-CoV-2 infection, do not rule out co-infections with other pathogens, and should not be used as the sole basis for treatment or other patient management decisions. Negative results must be combined with clinical observations, patient history, and epidemiological information. The expected result is Negative.  Fact Sheet for Patients: SugarRoll.be  Fact Sheet for Healthcare Providers: https://www.woods-mathews.com/  This test is not yet approved or cleared by the [place] FDA and  has been authorized for detection and/or diagnosis of SARS-CoV-2 by FDA under an Emergency Use Authorization (EUA). This EUA will remain  in effect (meaning this test can be used) for the duration of the COVID-19 declaration under Se ction 564(b)(1) of the Act, 21 U.S.C. section 360bbb-3(b)(1), unless the authorization is terminated or revoked sooner.  Performed at [facility], [place] [address], [place], [place] [address]   Glucose, capillary     Status: Abnormal   Collection Time: 03/06/21  8:14 AM  Result Value Ref Range   Glucose-Capillary 112 (H) 70 - 99 mg/dL    Comment: Glucose reference range applies only to samples taken after fasting for at least 8 hours.  Surgical pathology     Status: None   Collection Time: 03/06/21  9:58 AM  Result Value Ref Range   SURGICAL PATHOLOGY      SURGICAL PATHOLOGY CASE: [id number] PATIENT: [facility] Surgical Pathology Report     Specimen Submitted: A. Breast, right upper inner  quadrant B. Non-sentinel lymph node, right axilla C. Sentinel lymph node, right axilla  Clinical History: Right breast cancer    DIAGNOSIS: A. BREAST, RIGHT UPPER INNER QUADRANT; EXCISION: - INVASIVE MAMMARY CARCINOMA, NO SPECIAL TYPE. - DUCTAL CARCINOMA IN SITU, INTERMEDIATE GRADE. - SEE CANCER SUMMARY.  B. LYMPH NODE, RIGHT AXILLARY NONSENTINEL; EXCISION: - ONE LYMPH NODE, NEGATIVE FOR MALIGNANCY (0/1).  C. LYMPH NODE, RIGHT AXILLARY SENTINEL; EXCISION: - THREE LYMPH NODES, NEGATIVE FOR MALIGNANCY (0/3).  Comment: Multiple additional deeper recut levels were examined, together with immunohistochemical studies for myoepithelial markers to better characterize residual invasive  carcinoma at the biopsy site, as well as DCIS at the superior margin.  CANCER CASE SUMMARY: INVASIVE CARCINOMA OF THE BREAST Standard(s): AJ CC-UICC 8  SPECIMEN Procedure: Excision Specimen Laterality: Right  TUMOR Histologic Type: Invasive carcinoma of no special type (ductal) Histologic Grade (Not[place] Histologic Score)                      Glandular (Acinar)/Tubular Differentiation: 3                      Nuclear Pleomorphism: 2                      Mitotic Rate: 2                      Overall Grade: Grade 2 Tumor Size: 5 mm Ductal Carcinoma In Situ (DCIS): Present, intermediate grade Lymphovascular Invasion: Not identified Treatment Effect in the Breast: No known presurgical therapy  MARGINS Margin Status for Invasive Carcinoma: All margins negative for invasive carcinoma                      Distance from closest margin: 3 mm                      Specify closest margin: Anterior  Margin Status for DCIS: Margin(s) Involved by DCIS:                      Superior, unifocal  REGIONAL LYMPH NODES Regional Lymph Node Status: All regional lymph nodes negative for tumor                      Total  Number of Lymph Nodes Examined (sentinel and non-sentinel): 4                      Number  of Sentinel Nodes Examined: 3  DISTANT METASTASIS Distant Site(s) Involved, if applicable: Not applicable  PATHOLOGIC STAGE CLASSIFICATION (pTNM, AJCC 8th Edition): TNM Descriptors: Not applicable SH7W Regional Lymph Nodes Modifier: sn pN0 pM - Not applicable  SPECIAL STUDIES Breast Biomarker Testing Performed on Previous Biopsy: [id number] Estrogen Receptor (ER) Status: POSITIVE         Percentage of cells with nuclear positivity: Greater than 90%         Average intensity of staining: Strong  Progesterone Receptor (PgR) Status: POSITIVE         Percentage of cells with nuclear positivity: Greater than 90%         Average intensity of staining: Strong  HER2 (by immunohistochemistry): NEGATIVE (Score 0)  GROSS DESCRIPTION: Intraoperative Consultation:     Labeled: Right upper inner quadrant cancer     Received: Fresh     Specimen: Breast lumpectomy     Pathologic e valuation performed: Gross margin evaluation     Diagnosis: IOC: Clip present.  Lesion 2-3 mm from closest (anterior) margin     Communicated to: Called to Dr. Bary Castilla at 10:10 AM on 03/06/2021 Allena Napoleon M.D.     Tissue submitted: None  A. Labeled: Right upper inner quadrant cancer Received: Fresh Specimen radiograph image(s) available for review Radiographic findings: A Q shaped clip is present. Time in fixative: Collected at 9:58 AM on 03/06/2021 and placed in formalin at 10:10 AM on 03/06/2021  cold ischemic time: Less than 30 minutes Total fixation time: 7.25 hours Type of procedure: Breast lumpectomy Location / laterality of specimen: Right breast, upper inner quadrant Orientation of specimen: The specimen is received oriented with sutured metallic clips labeled cranial and medial. Inking: Anterior = green Inferior = blue Lateral = orange Medial = yellow Posterior = black Superior = red Size of specimen: 6.2 (superior to in ferior) x 5.4 (medial to lateral) x 2.7  (anterior to posterior) cm Skin: No distinct skin is grossly identified.  Biopsy site: There is a biopsy site which contains a Q shaped clip.  Number of discrete masses: 1 Size of mass(es): 0.6 x 0.5 x 0.4 cm Description of mass(es): The mass is tan-white, firm, and stellate. Distance between masses/clips: The clip is located at the posterior aspect of the mass. Margins: Anterior = 0.2 cm, posterior = 1.3 cm, medial = 1.5 cm, lateral = 0.9 cm, superior = 0.7 cm, inferior = 3.2 cm Description of remainder of tissue: Inferiorly adjacent to the mass is an ill-defined area of hemorrhage.  The remainder of the specimen is comprised of yellow lobulated adipose tissue admixed with tan fine fibrous tissue.  The fat to fibrous tissue ratio is 95:5.  Block summary (The mass is submitted entirely.  Approximately 85% of the specimen remains.): 1 - 2 - superior margin, perpendicularly sectioned 3 - closest superior margins to mass 4 - 5  - mass, submitted entirely with closest approach to anterior and lateral margins      5 - clip site 6 - adjacent hemorrhage, submitted entirely 7 - closest posterior margins to mass 8 - closest medial margins to mass 9 - 12 - inferior margin, perpendicularly sectioned (closest inferior margins to mass)  B. Labeled: Nonsentinel node right axilla Received: Formalin Collection time: 10:08 AM on 03/06/2021 Placed into formalin time: 10:09 AM on 03/06/2021 Tissue fragment(s): 1 Size: 2.1 x 1.9 x 1.1 cm Description: Received is a lymph node candidate with a scant amount of surrounding adipose tissue. The lymph node candidate is serially sectioned and entirely submitted in cassettes 1-2.  C. Labeled: Right axillary sentinel nodes Received: Formalin Collection time: 10:08 AM on 03/06/2021 Placed into formalin time: 10:13 AM on 03/06/2021 Tissue fragment(s): 2 Size: 2.2 x 1.2 x 0.8 cm and 3.2 x 2 x 1 cm Description: Received are fragments of yellow lobulated  adipose tissue . There are 3 embedded lymph node candidates ranging from 1.1 to 1.6 cm in greatest dimension. The lymph node candidates are entirely submitted in cassettes 1-3 as follows: 1 - 1 lymph node candidate, bisected 2 - 1 lymph node candidate, bisected 3 - 1 lymph node candidate, serially sectioned  RB 03/06/2021    Final Diagnosis performed by Allena Napoleon, MD.   Electronically signed 03/09/2021 10:00:04AM The electronic signature indicates that the named Attending Pathologist has evaluated the specimen Technical component performed at [facility], [address], [place], [place] [address] Lab: [phone] Dir: Rush Farmer, MD, MMM  Professional component performed at [facility], [facility], [place], [place],  [address] Lab: [phone] Dir: Dellia Nims. Rubinas, MD   Glucose, capillary     Status: Abnormal   Collection Time: 03/06/21 10:48 AM  Result Value Ref Range   Glucose-Capillary 119 (H) 70 - 99 mg/dL    Comment: Glucose reference range applies only to samples taken after fasting for at least 8 hours.  HM DIABETES EYE EXAM     Status: None   Collection Time:  03/28/21 12:00 AM  Result Value Ref Range   HM Diabetic Eye Exam No Retinopathy No Retinopathy  CBC     Status: Abnormal   Collection Time: 04/12/21  8:16 AM  Result Value Ref Range   WBC 3.4 (L) 4.0 - 10.5 K/uL   RBC 4.77 3.87 - 5.11 MIL/uL   Hemoglobin 13.0 12.0 - 15.0 g/dL   HCT 39.2 36.0 - 46.0 %   MCV 82.2 80.0 - 100.0 fL   MCH 27.3 26.0 - 34.0 pg   MCHC 33.2 30.0 - 36.0 g/dL   RDW 14.3 11.5 - 15.5 %   Platelets 291 150 - 400 K/uL   nRBC 0.0 0.0 - 0.2 %    Comment: Performed at [facility], [address] [place], [place] [address]  CBC     Status: Abnormal   Collection Time: 04/26/21  8:23 AM  Result Value Ref Range   WBC 3.5 (L) 4.0 - 10.5 K/uL   RBC 5.19 (H) 3.87 - 5.11 MIL/uL   Hemoglobin 14.0 12.0 - 15.0 g/dL   HCT 42.2 36.0 - 46.0 %   MCV 81.3 80.0 -  100.0 fL   MCH 27.0 26.0 - 34.0 pg   MCHC 33.2 30.0 - 36.0 g/dL   RDW 14.3 11.5 - 15.5 %   Platelets 346 150 - 400 K/uL   nRBC 0.0 0.0 - 0.2 %    Comment: Performed at [facility], [address] [place], [place] [address]  Comprehensive metabolic panel     Status: Abnormal   Collection Time: 05/09/21  1:51 PM  Result Value Ref Range   Sodium 137 135 - 145 mmol/L   Potassium 3.4 (L) 3.5 - 5.1 mmol/L   Chloride 99 98 - 111 mmol/L   CO2 29 22 - 32 mmol/L   Glucose, Bld 128 (H) 70 - 99 mg/dL    Comment: Glucose reference range applies only to samples taken after fasting for at least 8 hours.   BUN 18 8 - 23 mg/dL   Creatinine, Ser 1.12 (H) 0.44 - 1.00 mg/dL   Calcium 8.8 (L) 8.9 - 10.3 mg/dL   Total Protein 7.0 6.5 - 8.1 g/dL   Albumin 3.9 3.5 - 5.0 g/dL   AST 18 15 - 41 U/L   ALT 16 0 - 44 U/L   Alkaline Phosphatase 43 38 - 126 U/L   Total Bilirubin 0.5 0.3 - 1.2 mg/dL   GFR, Estimated 55 (L) >60 mL/min    Comment: (NOTE) Calculated using the CKD-EPI Creatinine Equation (2021)    Anion gap 9 5 - 15    Comment: Performed at [facility], [address] [place], [place] [address]  CBC with Differential/Platelet     Status: Abnormal   Collection Time: 05/09/21  1:51 PM  Result Value Ref Range   WBC 3.8 (L) 4.0 - 10.5 K/uL   RBC 4.79 3.87 - 5.11 MIL/uL   Hemoglobin 12.9 12.0 - 15.0 g/dL   HCT 39.4 36.0 - 46.0 %   MCV 82.3 80.0 - 100.0 fL   MCH 26.9 26.0 - 34.0 pg   MCHC 32.7 30.0 - 36.0 g/dL   RDW 14.7 11.5 - 15.5 %   Platelets 278 150 - 400 K/uL   nRBC 0.0 0.0 - 0.2 %   Neutrophils Relative % 48 %   Neutro Abs 1.8 1.7 - 7.7 K/uL   Lymphocytes Relative 34 %   Lymphs Abs 1.3 0.7 - 4.0 K/uL  Monocytes Relative 11 %   Monocytes Absolute 0.4 0.1 - 1.0 K/uL   Eosinophils Relative 6 %   Eosinophils Absolute 0.2 0.0 - 0.5 K/uL   Basophils Relative 1 %   Basophils Absolute 0.0 0.0 - 0.1 K/uL   Immature Granulocytes 0 %   Abs Immature Granulocytes 0.01 0.00 -  0.07 K/uL    Comment: Performed at [facility], [place], [place] [address]  POCT HgB A1C     Status: Abnormal   Collection Time: 05/29/21  9:11 AM  Result Value Ref Range   Hemoglobin A1C 5.9 (A) 4.0 - 5.6 %   HbA1c POC (<> result, manual entry)     HbA1c, POC (prediabetic range)     HbA1c, POC (controlled diabetic range)       PHQ2/9: Depression screen [facility] 2/9 05/29/2021 01/26/2021 10/20/2020 10/20/2020 06/20/2020  Decreased Interest 0 0 0 0 0  Down, Depressed, Hopeless 0 0 0 0 0  PHQ - 2 Score 0 0 0 0 0  Altered sleeping - 0 1 - 0  Tired, decreased energy - 0 1 - 0  Change in appetite - 0 0 - 0  Feeling bad or failure about yourself  - 0 0 - 0  Trouble concentrating - 0 0 - 0  Moving slowly or fidgety/restless - 0 0 - 0  Suicidal thoughts - 0 0 - 0  PHQ-9 Score - 0 2 - 0  Difficult doing work/chores - - Not difficult at all - -  Some recent data might be hidden    phq 9 is negative   Fall Risk: Fall Risk  05/29/2021 01/26/2021 10/20/2020 06/20/2020 02/26/2020  Falls in the past year? 1 0 0 0 0  Number falls in past yr: 0 0 0 0 0  Injury with Fall? 0 0 0 0 0      Assessment & Plan  1. Diabetes mellitus type 2 in obese (HCC)  - POCT HgB A1C  2. Dyslipidemia   3. Benign essential HTN  - COMPLETE METABOLIC PANEL WITH GFR  4. Dyslipidemia due to type 2 diabetes mellitus ([place])   5. Malignant neoplasm of upper-outer quadrant of right female breast, unspecified estrogen receptor status ([place])   6. Vitamin D deficiency   7. Morbid obesity ([place])  Discussed with the patient the risk posed by an increased BMI. Discussed importance of portion control, calorie counting and at least 150 minutes of physical activity weekly. Avoid sweet beverages and drink more water. Eat at least 6 servings of fruit and vegetables daily    8. Obstructive apnea   9. Primary osteoarthritis of both knees  - diclofenac Sodium (VOLTAREN) 1 % GEL; Apply 2 g topically 4  (four) times daily as needed.  Dispense: 100 g; Refill: 2  10. Post-surgical hypothyroidism  - TSH - levothyroxine (SYNTHROID) 125 MCG tablet; Take 1 tablet (125 mcg total) by mouth daily.  Dispense: 90 tablet; Refill: 1  11. Hypertension associated with diabetes ([place])   12. Myalgia  - Magnesium - CK (Creatine Kinase)  13. Hypocalcemia  - COMPLETE METABOLIC PANEL WITH GFR  14. Controlled type 2 diabetes mellitus with stage 3 chronic kidney disease, without long-term current use of insulin (HCC)  - Semaglutide (RYBELSUS) 7 MG TABS; Take 1 tablet by mouth daily.  Dispense: 90 tablet; Refill: 1

## 2021-05-29 ENCOUNTER — Encounter: Payer: Self-pay | Admitting: Family Medicine

## 2021-05-29 ENCOUNTER — Ambulatory Visit (INDEPENDENT_AMBULATORY_CARE_PROVIDER_SITE_OTHER): Payer: BC Managed Care – PPO | Admitting: Family Medicine

## 2021-05-29 ENCOUNTER — Other Ambulatory Visit: Payer: Self-pay

## 2021-05-29 ENCOUNTER — Other Ambulatory Visit: Payer: Self-pay | Admitting: Family Medicine

## 2021-05-29 VITALS — BP 132/82 | HR 87 | Temp 98.4°F | Resp 16 | Ht 64.0 in | Wt 218.0 lb

## 2021-05-29 DIAGNOSIS — E559 Vitamin D deficiency, unspecified: Secondary | ICD-10-CM

## 2021-05-29 DIAGNOSIS — E890 Postprocedural hypothyroidism: Secondary | ICD-10-CM

## 2021-05-29 DIAGNOSIS — I10 Essential (primary) hypertension: Secondary | ICD-10-CM

## 2021-05-29 DIAGNOSIS — E1159 Type 2 diabetes mellitus with other circulatory complications: Secondary | ICD-10-CM

## 2021-05-29 DIAGNOSIS — M791 Myalgia, unspecified site: Secondary | ICD-10-CM | POA: Diagnosis not present

## 2021-05-29 DIAGNOSIS — E785 Hyperlipidemia, unspecified: Secondary | ICD-10-CM

## 2021-05-29 DIAGNOSIS — E1122 Type 2 diabetes mellitus with diabetic chronic kidney disease: Secondary | ICD-10-CM

## 2021-05-29 DIAGNOSIS — E1169 Type 2 diabetes mellitus with other specified complication: Secondary | ICD-10-CM

## 2021-05-29 DIAGNOSIS — E669 Obesity, unspecified: Secondary | ICD-10-CM

## 2021-05-29 DIAGNOSIS — G4733 Obstructive sleep apnea (adult) (pediatric): Secondary | ICD-10-CM

## 2021-05-29 DIAGNOSIS — C50411 Malignant neoplasm of upper-outer quadrant of right female breast: Secondary | ICD-10-CM | POA: Diagnosis not present

## 2021-05-29 DIAGNOSIS — N183 Chronic kidney disease, stage 3 unspecified: Secondary | ICD-10-CM

## 2021-05-29 DIAGNOSIS — M170 Bilateral primary osteoarthritis of knee: Secondary | ICD-10-CM

## 2021-05-29 DIAGNOSIS — I152 Hypertension secondary to endocrine disorders: Secondary | ICD-10-CM

## 2021-05-29 LAB — COMPLETE METABOLIC PANEL WITH GFR
AG Ratio: 1.4 (calc) (ref 1.0–2.5)
ALT: 23 U/L (ref 6–29)
AST: 21 U/L (ref 10–35)
Albumin: 4.2 g/dL (ref 3.6–5.1)
Alkaline phosphatase (APISO): 47 U/L (ref 37–153)
BUN/Creatinine Ratio: 14 (calc) (ref 6–22)
BUN: 15 mg/dL (ref 7–25)
CO2: 30 mmol/L (ref 20–32)
Calcium: 9.5 mg/dL (ref 8.6–10.4)
Chloride: 102 mmol/L (ref 98–110)
Creat: 1.11 mg/dL — ABNORMAL HIGH (ref 0.50–1.05)
Globulin: 3 g/dL (calc) (ref 1.9–3.7)
Glucose, Bld: 100 mg/dL — ABNORMAL HIGH (ref 65–99)
Potassium: 4.1 mmol/L (ref 3.5–5.3)
Sodium: 139 mmol/L (ref 135–146)
Total Bilirubin: 0.3 mg/dL (ref 0.2–1.2)
Total Protein: 7.2 g/dL (ref 6.1–8.1)
eGFR: 56 mL/min/{1.73_m2} — ABNORMAL LOW (ref >=60)

## 2021-05-29 LAB — TSH: TSH: 0.69 mIU/L (ref 0.40–4.50)

## 2021-05-29 LAB — CK: Total CK: 229 U/L — ABNORMAL HIGH (ref 29–143)

## 2021-05-29 LAB — POCT GLYCOSYLATED HEMOGLOBIN (HGB A1C): Hemoglobin A1C: 5.9 % — AB (ref 4.0–5.6)

## 2021-05-29 LAB — MAGNESIUM: Magnesium: 2.2 mg/dL (ref 1.5–2.5)

## 2021-05-29 MED ORDER — RYBELSUS 7 MG PO TABS: 1.0000 | ORAL_TABLET | Freq: Every day | ORAL | 1 refills | Status: DC

## 2021-05-29 MED ORDER — DAPAGLIFLOZIN PROPANEDIOL 10 MG PO TABS: ORAL_TABLET | ORAL | 0 refills | Status: DC

## 2021-05-29 MED ORDER — DICLOFENAC SODIUM 1 % EX GEL: 2.0000 g | Freq: Four times a day (QID) | CUTANEOUS | 2 refills | Status: DC | PRN

## 2021-05-29 MED ORDER — LEVOTHYROXINE SODIUM 125 MCG PO TABS
125.0000 ug | ORAL_TABLET | Freq: Every day | ORAL | 1 refills | Status: DC
Start: 2021-05-29 — End: 2021-11-21

## 2021-05-29 NOTE — Patient Instructions (Signed)
Lac-hytrin lotion Sending rx of Voltaren to [facility], use [facility] not our insurance to get discount

## 2021-05-30 ENCOUNTER — Inpatient Hospital Stay: Payer: BC Managed Care – PPO | Attending: Oncology | Admitting: Oncology

## 2021-05-30 ENCOUNTER — Encounter: Payer: Self-pay | Admitting: Oncology

## 2021-05-30 ENCOUNTER — Telehealth: Payer: Self-pay | Admitting: Family Medicine

## 2021-05-30 VITALS — BP 109/76 | HR 80 | Temp 96.6°F | Resp 18 | Wt 219.5 lb

## 2021-05-30 DIAGNOSIS — E039 Hypothyroidism, unspecified: Secondary | ICD-10-CM | POA: Diagnosis not present

## 2021-05-30 DIAGNOSIS — Z7984 Long term (current) use of oral hypoglycemic drugs: Secondary | ICD-10-CM | POA: Diagnosis not present

## 2021-05-30 DIAGNOSIS — Z79899 Other long term (current) drug therapy: Secondary | ICD-10-CM | POA: Insufficient documentation

## 2021-05-30 DIAGNOSIS — R52 Pain, unspecified: Secondary | ICD-10-CM | POA: Diagnosis not present

## 2021-05-30 DIAGNOSIS — G4733 Obstructive sleep apnea (adult) (pediatric): Secondary | ICD-10-CM | POA: Diagnosis not present

## 2021-05-30 DIAGNOSIS — Z170 Estrogen receptor positive status [ER+]: Secondary | ICD-10-CM | POA: Diagnosis not present

## 2021-05-30 DIAGNOSIS — Z7982 Long term (current) use of aspirin: Secondary | ICD-10-CM | POA: Diagnosis not present

## 2021-05-30 DIAGNOSIS — C50211 Malignant neoplasm of upper-inner quadrant of right female breast: Secondary | ICD-10-CM | POA: Diagnosis not present

## 2021-05-30 DIAGNOSIS — Z79811 Long term (current) use of aromatase inhibitors: Secondary | ICD-10-CM | POA: Insufficient documentation

## 2021-05-30 DIAGNOSIS — E119 Type 2 diabetes mellitus without complications: Secondary | ICD-10-CM | POA: Insufficient documentation

## 2021-05-30 DIAGNOSIS — I10 Essential (primary) hypertension: Secondary | ICD-10-CM | POA: Insufficient documentation

## 2021-05-30 DIAGNOSIS — Z803 Family history of malignant neoplasm of breast: Secondary | ICD-10-CM | POA: Insufficient documentation

## 2021-05-30 MED ORDER — EXEMESTANE 25 MG PO TABS: 25.0000 mg | ORAL_TABLET | Freq: Every day | ORAL | 2 refills | Status: DC

## 2021-05-30 NOTE — Progress Notes (Signed)
Hematology/Oncology follow up  note [facility] Telephone:(336) [phone] Fax:(336) [phone]   Patient Care Team: Steele Sizer, MD as PCP - General [name], MD as PCP - Cardiology (Cardiology) Rico Junker, RN as Oncology Nurse Navigator  REFERRING PROVIDER: Steele Sizer, MD  CHIEF COMPLAINTS/REASON FOR VISIT:  Follow up of breast cancer.   HISTORY OF PRESENTING ILLNESS:   Andrea Randolph is a  65 y.o.  female with PMH listed below was seen in consultation at the request of  Steele Sizer, MD  for evaluation of breast cancer.   01/12/21 screening mammogram showed focal asymmetry with possible distortion.  01/26/21 right diagnostic mammogram and US showed 3x5x21m hypoechoic mass, s/p biopsy on 02/07/21 Pathology showed invasive mammary carcinoma, grade 2, ER90% PR 90%, HER2 negative.   Patient was seen by Dr.Byrnett yesterday. She presents to establish care with oncology Accompanied by her daughter  Menarche 89years with Age at first childbirth 89  OCP use for about 20 years Denies any estrogen replacement. LMP in 542s Denies any previous chest radiation. History of left breast biopsy previously. Family history Father for maternal aunt who was diagnosed of breast cancer at age of 547  03/06/2021 lumpectomy and sentinel lymph node biopsy. Pathology showed invasive mammary carcinoma, no special type, DCIS in situ, intermediate grade.  All 3 sentinel lymph nodes were negative.  1 nonsentinel lymph node was negative.  Margins for invasive carcinoma was negative.  Margins for DCIS was unifocal positive. Patient declined reexcision.Patient's case was discussed on breast cancer tumor board on 03/20/2021.   INTERVAL HISTORY MJENETTE RAYSONis a 65y.o. female who has above history reviewed by me today presents for follow up visit for management of breast cancer Problems and complaints are listed below:  05/03/2021 finished adjuvant radiation.   05/09/2021, patient started on Arimidex.  Patient called to report that she has experienced significant leg pain, back pain, neck pain shortly after starting Arimidex.  Patient would like to rediscuss alternatives with me. Patient reports that the pain is mostly on her left side.  She takes Tylenol with some relief.  Pain is 7-8 out of 10.  No significant hot flash.  Review of Systems  Constitutional:  Negative for appetite change, chills, fatigue and fever.  HENT:   Negative for hearing loss and voice change.   Eyes:  Negative for eye problems.  Respiratory:  Negative for chest tightness and cough.   Cardiovascular:  Negative for chest pain.  Gastrointestinal:  Negative for abdominal distention, abdominal pain and blood in stool.  Endocrine: Negative for hot flashes.  Genitourinary:  Negative for difficulty urinating and frequency.   Musculoskeletal:  Negative for arthralgias.  Skin:  Negative for itching and rash.  Neurological:  Negative for extremity weakness.  Hematological:  Negative for adenopathy.  Psychiatric/Behavioral:  Negative for confusion. The patient is not nervous/anxious.    MEDICAL HISTORY:  Past Medical History:  Diagnosis Date   Allergy    Anemia    Arthritis of right shoulder region    Dr. STamala Julian  Chronic insomnia    Diabetes mellitus without complication ([place]    Family history of adverse reaction to anesthesia    sister with Nausea   GERD (gastroesophageal reflux disease)    Goiter    Dr. SGabriel Carina  Heart murmur    Hyperlipidemia    Hypertension    Hypertensive retinopathy of left eye    Hypothyroidism, adult    Dr. SGabriel Carina  Inflammation of urethra    Menopause    Microalbuminuria    DM   Mixed incontinence    Muscle cramps    Obesity    OSA (obstructive sleep apnea)    Radiculitis, lumbosacral    Vitamin D deficiency     SURGICAL HISTORY: Past Surgical History:  Procedure Laterality Date   BREAST BIOPSY Left 90's   benign core needle     BREAST BIOPSY Right 02/07/2021   [place] Bx, Q-clip, path pending    BREAST LUMPECTOMY WITH SENTINEL LYMPH NODE BIOPSY Right 03/06/2021   Procedure: BREAST LUMPECTOMY WITH SENTINEL LYMPH NODE BX;  Surgeon: Robert Bellow, MD;  Location: [facility] ORS;  Service: General;  Laterality: Right;   COLONOSCOPY WITH PROPOFOL N/A 01/26/2019   Procedure: COLONOSCOPY WITH PROPOFOL;  Surgeon: Lin Landsman, MD;  Location: [facility] END[facility];  Service: Gastroenterology;  Laterality: N/A;   FOOT SURGERY Bilateral    heel spurs   JOINT REPLACEMENT     TOTAL SHOULDER REPLACEMENT Right 01/25/2015   Dr. Roland Rack   TOTAL THYROIDECTOMY  08/01/2018    SOCIAL HISTORY: Social History   Socioeconomic History   Marital status: Legally Separated    Spouse name: Not on file   Number of children: 1   Years of education: Not on file   Highest education level: 12th grade  Occupational History   Occupation: Cytogeneticist  Tobacco Use   Smoking status: Never   Smokeless tobacco: Never  Vaping Use   Vaping Use: Never used  Substance and Sexual Activity   Alcohol use: No    Alcohol/week: 0.0 standard drinks   Drug use: No   Sexual activity: Not Currently    Birth control/protection: Post-menopausal  Other Topics Concern   Not on file  Social History Narrative   Separated in around 2009.   She was living with her daughter, however currently moved in with her sister and nephew , but is looking for a house.    Social Determinants of Health   Financial Resource Strain: Low Risk    Difficulty of Paying Living Expenses: Not hard at all  Food Insecurity: No Food Insecurity   Worried About Charity fundraiser in the Last Year: Never true   [place] in the Last Year: Never true  Transportation Needs: No Transportation Needs   Lack of Transportation (Medical): No   Lack of Transportation (Non-Medical): No  Physical Activity: Sufficiently Active   Days of Exercise per Week: 3 days   Minutes of Exercise  per Session: 60 min  Stress: No Stress Concern Present   Feeling of Stress : Only a little  Social Connections: Moderately Integrated   Frequency of Communication with Friends and Family: More than three times a week   Frequency of Social Gatherings with Friends and Family: More than three times a week   Attends Religious Services: More than 4 times per year   Active Member of [employer] or Organizations: Yes   Attends Music therapist: More than 4 times per year   Marital Status: Separated  Intimate Partner Violence: Not At Risk   Fear of Current or Ex-Partner: No   Emotionally Abused: No   Physically Abused: No   Sexually Abused: No    FAMILY HISTORY: Family History  Problem Relation Age of Onset   Heart disease Mother    Heart disease Father    Kidney disease Sister    Hypertension Sister    Diabetes Sister  Hypertension Brother    CVA Brother    Breast cancer Maternal Aunt 26   Hypertension Sister    Kidney failure Sister    Ovarian cancer Neg Hx    Colon cancer Neg Hx     ALLERGIES:  is allergic to rosuvastatin.  MEDICATIONS:  Current Outpatient Medications  Medication Sig Dispense Refill   aspirin 81 MG tablet Take 81 mg by mouth daily.     Cholecalciferol (VITAMIN D3) 1000 units CAPS Take 1,000 Units by mouth daily.     dapagliflozin propanediol (FARXIGA) 10 MG TABS tablet TAKE 1 TABLET BY MOUTH ONCE DAILY BEFORE BREAKFAST IN PLACE OF XIGDUO. 90 tablet 0   diclofenac Sodium (VOLTAREN) 1 % GEL Apply 2 g topically 4 (four) times daily as needed. 100 g 2   exemestane (AROMASIN) 25 MG tablet Take 1 tablet (25 mg total) by mouth daily after breakfast. 30 tablet 2   ezetimibe (ZETIA) 10 MG tablet TAKE 1 TABLET DAILY (Patient taking differently: Take 10 mg by mouth daily.) 90 tablet 1   glucose blood test strip FREESTYLE LITE TEST (In Vitro Strip)  use as directed for 0 days  Quantity: 200.00;  Refills: 3   Ordered :25-Nov-2014  Vonna Kotyk ;  Started  30-Dec-2008 Active Comments: DX: 250.00     ketoconazole (NIZORAL) 2 % cream Apply 1 application topically daily. (Patient taking differently: Apply 1 application topically daily as needed for irritation.) 60 g 0   levothyroxine (SYNTHROID) 125 MCG tablet Take 1 tablet (125 mcg total) by mouth daily. 90 tablet 1   Olmesartan-amLODIPine-HCTZ 40-5-25 MG TABS TAKE 1 TABLET DAILY (SCHEDULE APPOINTMENT FOR FURTHER REFILLS) (Patient taking differently: Take 1 tablet by mouth daily.) 90 tablet 3   pravastatin (PRAVACHOL) 20 MG tablet TAKE 1 TABLET BY MOUTH ONCE DAILY IN THE EVENING (Patient taking differently: Take 20 mg by mouth every evening.) 30 tablet 5   Semaglutide (RYBELSUS) 7 MG TABS Take 1 tablet by mouth daily. 90 tablet 1   No current facility-administered medications for this visit.     PHYSICAL EXAMINATION: ECOG PERFORMANCE STATUS: 0 - Asymptomatic Vitals:   05/30/21 0911  BP: 109/76  Pulse: 80  Resp: 18  Temp: (!) 96.6 F (35.9 C)   Filed Weights   05/30/21 0911  Weight: 219 lb 8 oz (99.6 kg)    Physical Exam Constitutional:      General: She is not in acute distress. HENT:     Head: Normocephalic and atraumatic.  Eyes:     General: No scleral icterus. Cardiovascular:     Rate and Rhythm: Normal rate and regular rhythm.     Heart sounds: Normal heart sounds.  Pulmonary:     Effort: Pulmonary effort is normal. No respiratory distress.     Breath sounds: No wheezing.  Abdominal:     General: Bowel sounds are normal. There is no distension.     Palpations: Abdomen is soft.  Musculoskeletal:        General: No deformity. Normal range of motion.     Cervical back: Normal range of motion and neck supple.  Skin:    General: Skin is warm and dry.     Findings: No erythema or rash.  Neurological:     Mental Status: She is alert and oriented to person, place, and time. Mental status is at baseline.     Cranial Nerves: No cranial nerve deficit.     Coordination:  Coordination normal.  Psychiatric:  Mood and Affect: Mood normal.    LABORATORY DATA:  I have reviewed the data as listed Lab Results  Component Value Date   WBC 3.8 (L) 05/09/2021   HGB 12.9 05/09/2021   HCT 39.4 05/09/2021   MCV 82.3 05/09/2021   PLT 278 05/09/2021   Recent Labs    06/20/20 0941 10/20/20 1045 01/26/21 0949 05/09/21 1351 05/29/21 0923  NA 140 143 141 137 139  K 3.7 4.0 4.1 3.4* 4.1  CL 103 105 103 99 102  CO2 _0 GLUCOSE 89 85 92 128* 100*  BUN _1 CREATININE 1.13* 1.04* 1.15* 1.12* 1.11*  CALCIUM 9.3 9.8 9.7 8.8* 9.5  GFRNONAA 52* 57* 51* 55*  --   GFRAA 60 66 59*  --   --   PROT 6.8 7.1 7.0 7.0 7.2  ALBUMIN  --   --   --  3.9  --   AST _2 ALT _3 ALKPHOS  --   --   --  43  --   BILITOT 0.4 0.4 0.5 0.5 0.3    Iron/TIBC/Ferritin/ %Sat    Component Value Date/Time   FERRITIN 27 08/29/2015 1135      RADIOGRAPHIC STUDIES: I have personally reviewed the radiological images as listed and agreed with the findings in the report. DG Bone Density  Result Date: 05/16/2021 EXAM: DUAL X-RAY ABSORPTIOMETRY (DXA) FOR BONE MINERAL DENSITY IMPRESSION: Your patient Makayela Secrest completed a BMD test on 05/16/2021 using the [place] (software version: 14.10) manufactured by [employer]. The following summarizes the results of our evaluation. Technologist: SCE PATIENT BIOGRAPHICAL: Name: Sehar, [place] Patient ID: [id number] Birth Date: [birth year] Height: 64.0 in. Gender: Female Exam Date: 05/16/2021 Weight: 216.8 lbs. Indications: Breast CA, Diabetic, High Risk Meds, History of Radiation, Postmenopausal, Vitamin D Deficiency Fractures: Treatments: Anastrozole, Farxiga, RYBELSUS, Vitamin D DENSITOMETRY RESULTS: Site      Region    Measured Date Measured Age WHO Classification Young Adult T-score BMD         %Change vs. Previous Significant Change (*) AP Spine L1-L4 05/16/2022 63.5 Normal 3.4  1.615 g/cm2 - - DualFemur Neck Left 05/16/2021 63.5 Normal -0.5 0.971 g/cm2 - - ASSESSMENT: The BMD measured at Femur Neck Left is 0.971 g/cm2 with a T-score of -0.5. This patient is considered normal according to [facility]) criteria. The scan quality is good. World Pharmacologist [facility]) criteria for post-menopausal, Caucasian Women: Normal:                   T-score at or above -1 SD Osteopenia/low bone mass: T-score between -1 and -2.5 SD Osteoporosis:             T-score at or below -2.5 SD RECOMMENDATIONS: 1. All patients should optimize calcium and vitamin D intake. 2. Consider FDA-approved medical therapies in postmenopausal women and men aged 75 years and older, based on the following: a. A hip or vertebral(clinical or morphometric) fracture b. T-score < -2.5 at the femoral neck or spine after appropriate evaluation to exclude secondary causes c. Low bone mass (T-score between -1.0 and -2.5 at the femoral neck or spine) and a 10-year probability of a hip fracture > 3% or a 10-year probability of a major osteoporosis-related fracture > 20% based on the [place]-adapted WHO algorithm 3. Clinician judgment and/or patient preferences may indicate treatment for people  with 10-year fracture probabilities above or below these levels FOLLOW-UP: People with diagnosed cases of osteoporosis or at high risk for fracture should have regular bone mineral density tests. For patients eligible for Medicare, routine testing is allowed once every 2 years. The testing frequency can be increased to one year for patients who have rapidly progressing disease, those who are receiving or discontinuing medical therapy to restore bone mass, or have additional risk factors. I have reviewed this report, and agree with the above findings. Mark A. Thornton Papas, M.D. [facility] Electronically Signed   By: Lavonia Dana M.D.   On: 05/16/2021 13:40       ASSESSMENT & PLAN:  1. Malignant neoplasm of upper-inner  quadrant of right breast in female, estrogen receptor positive ([place])   2. Aromatase inhibitor use   3. Complaints of total body pain   Cancer Staging Malignant 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, estrogen receptor positive ([place]) Staging form: Breast, AJCC 8th Edition - Pathologic stage from 03/20/2021: Stage IA (pT1a, pN0, cM0, G2, ER+, PR+, HER2-) - Signed by Earlie Server, MD on 03/20/2021   Right breast invasive carcinoma.  pT1a pN0, ER positive, PR positive, HER2 negative. Focal DCIS margin positive.Patient declined reexcision.s/p adjuvant radiation.  Patient did not tolerate Arimidex.  Severe body pain. Discussed with patient about alternatives of Aromasin 25 mg daily.  Patient is made aware that Aromasin may have overlapping side effects with Arimidex.  Some patient may tolerate 1 AI better than other AI's.  She agrees to try.  Prescription was sent to pharmacy.  Recommend calcium 1282m daily.  Patient has a normal baseline bone density on recent DEXA Patient was advised to call if she continues to have difficulties tolerating Aromasin.  May switch to letrozole, if still not able to tolerate, we discussed about option of trying tamoxifen.   All questions were answered. The patient knows to call the clinic with any problems questions or concerns.  cc SSteele Sizer MD    Return of visit: Keep her current appointment.     ZEarlie Server MD, PhD Hematology Oncology [facility]en[facility] Hospitalat AWest Marion Community HospitalPager- [phone]/19/2022

## 2021-05-30 NOTE — Progress Notes (Signed)
Patients reports starting to have generalized pain approx 1 week after staring Arimidex. Stopped taking it yesterday due to the pain side effect

## 2021-05-31 ENCOUNTER — Other Ambulatory Visit: Payer: Self-pay | Admitting: Family Medicine

## 2021-05-31 DIAGNOSIS — R748 Abnormal levels of other serum enzymes: Secondary | ICD-10-CM

## 2021-05-31 NOTE — Telephone Encounter (Signed)
error 

## 2021-06-01 ENCOUNTER — Telehealth: Payer: Self-pay | Admitting: Family Medicine

## 2021-06-01 NOTE — Telephone Encounter (Signed)
Patient says has message on my chart yesterday,from Dr Ancil Boozer about checking her muscles. Does she ned to make an appt? Please call back in regards to this

## 2021-06-02 NOTE — Telephone Encounter (Signed)
Let pt know it's just for a lab recheck and she does not need an appt for that. Gave her lab hours and she gave verbal understanding.

## 2021-06-05 ENCOUNTER — Other Ambulatory Visit: Payer: Self-pay

## 2021-06-05 ENCOUNTER — Ambulatory Visit
Admission: RE | Admit: 2021-06-05 | Discharge: 2021-06-05 | Disposition: A | Discharge status: Home / Self Care | Payer: BC Managed Care – PPO | Source: Ambulatory Visit | Attending: Radiation Oncology | Admitting: Radiation Oncology

## 2021-06-05 ENCOUNTER — Encounter: Payer: Self-pay | Admitting: Radiation Oncology

## 2021-06-05 VITALS — BP 139/87 | HR 81 | Temp 96.7°F | Wt 218.0 lb

## 2021-06-05 DIAGNOSIS — Z79811 Long term (current) use of aromatase inhibitors: Secondary | ICD-10-CM | POA: Diagnosis not present

## 2021-06-05 DIAGNOSIS — R232 Flushing: Secondary | ICD-10-CM | POA: Insufficient documentation

## 2021-06-05 DIAGNOSIS — Z923 Personal history of irradiation: Secondary | ICD-10-CM | POA: Diagnosis not present

## 2021-06-05 DIAGNOSIS — C50211 Malignant neoplasm of upper-inner quadrant of right female breast: Secondary | ICD-10-CM

## 2021-06-05 DIAGNOSIS — Z170 Estrogen receptor positive status [ER+]: Secondary | ICD-10-CM | POA: Insufficient documentation

## 2021-06-05 NOTE — Progress Notes (Signed)
Radiation Oncology Follow up Note  Name: Andrea Randolph   Date:   06/05/2021 MRN:  [id number] DOB: 04-14-1956    This 65 y.o. female presents to the clinic today for 1 month follow-up status post whole breast radiation to her right breast for stage Ia ER/PR positive invasive mammary carcinoma.  REFERRING PROVIDER: Steele Sizer, MD  HPI: Patient is a 65 year old female now at 1 month having completed whole breast radiation to her right breast for stage Ia (T1 a N0 M0) ER/PR positive invasive mammary carcinoma.  Seen today in routine follow-up she is doing well she has started antiestrogen therapy which is causing some moderate arthritic type pain and that is being followed by medical oncology.  She specifically denies breast tenderness cough or bone pain..  COMPLICATIONS OF TREATMENT: none  FOLLOW UP COMPLIANCE: keeps appointments   PHYSICAL EXAM:  BP 139/87   Pulse 81   Temp (!) 96.7 F (35.9 C) (Tympanic)   Wt 218 lb (98.9 kg)   BMI 37.42 kg/m  Right breast still is hyperpigmented with some slight dry desquamation of skin.  No dominant masses noted in either breast.  No axillary or supraclavicular adenopathy is appreciated.  Well-developed well-nourished patient in NAD. HEENT reveals PERLA, EOMI, discs not visualized.  Oral cavity is clear. No oral mucosal lesions are identified. Neck is clear without evidence of cervical or supraclavicular adenopathy. Lungs are clear to A&P. Cardiac examination is essentially unremarkable with regular rate and rhythm without murmur rub or thrill. Abdomen is benign with no organomegaly or masses noted. Motor sensory and DTR levels are equal and symmetric in the upper and lower extremities. Cranial nerves II through XII are grossly intact. Proprioception is intact. No peripheral adenopathy or edema is identified. No motor or sensory levels are noted. Crude visual fields are within normal range.  RADIOLOGY RESULTS: No current films for review  PLAN:  Present time patient is doing well 1 month out from whole breast radiation.  And pleased with her overall progress.  She is having some side effects from her antiestrogen therapy which is being managed by medical oncology.  For hot flashes I have suggested some vitamin E supplements.  I have asked to see her back in 4 to 5 months for follow-up.  Patient knows to call with any concerns.  I would like to take this opportunity to thank you for allowing me to participate in the care of your patient.Noreene Filbert, MD

## 2021-06-13 ENCOUNTER — Other Ambulatory Visit: Payer: Self-pay

## 2021-06-13 DIAGNOSIS — E782 Mixed hyperlipidemia: Secondary | ICD-10-CM

## 2021-06-13 MED ORDER — EZETIMIBE 10 MG PO TABS: 10.0000 mg | ORAL_TABLET | Freq: Every day | ORAL | 0 refills | Status: DC

## 2021-06-13 NOTE — Telephone Encounter (Signed)
*  STAT* If patient is at the pharmacy, call can be transferred to refill team.   1. Which medications need to be refilled? (please list name of each medication and dose if known)  ZETIA  2. Which pharmacy/location (including street and city if local pharmacy) is medication to be sent to? [place]  3. Do they need a 30 day or 90 day supply? [place]

## 2021-06-20 ENCOUNTER — Ambulatory Visit: Payer: Self-pay | Admitting: *Deleted

## 2021-06-20 ENCOUNTER — Ambulatory Visit
Admission: RE | Admit: 2021-06-20 | Discharge: 2021-06-20 | Disposition: A | Discharge status: Home / Self Care | Payer: BC Managed Care – PPO | Source: Ambulatory Visit | Attending: Family Medicine | Admitting: Family Medicine

## 2021-06-20 ENCOUNTER — Other Ambulatory Visit: Payer: Self-pay

## 2021-06-20 ENCOUNTER — Encounter: Payer: Self-pay | Admitting: Family Medicine

## 2021-06-20 ENCOUNTER — Ambulatory Visit
Admission: RE | Admit: 2021-06-20 | Discharge: 2021-06-20 | Disposition: A | Discharge status: Home / Self Care | Payer: BC Managed Care – PPO | Source: Home / Self Care | Attending: Family Medicine | Admitting: Family Medicine

## 2021-06-20 ENCOUNTER — Ambulatory Visit: Payer: BC Managed Care – PPO | Admitting: Family Medicine

## 2021-06-20 VITALS — BP 124/68 | HR 90 | Temp 98.1°F | Resp 16 | Ht 64.0 in | Wt 216.3 lb

## 2021-06-20 DIAGNOSIS — M2550 Pain in unspecified joint: Secondary | ICD-10-CM | POA: Diagnosis not present

## 2021-06-20 DIAGNOSIS — M170 Bilateral primary osteoarthritis of knee: Secondary | ICD-10-CM

## 2021-06-20 DIAGNOSIS — M25562 Pain in left knee: Secondary | ICD-10-CM | POA: Diagnosis not present

## 2021-06-20 DIAGNOSIS — M7989 Other specified soft tissue disorders: Secondary | ICD-10-CM | POA: Diagnosis not present

## 2021-06-20 DIAGNOSIS — M25462 Effusion, left knee: Secondary | ICD-10-CM | POA: Insufficient documentation

## 2021-06-20 DIAGNOSIS — R748 Abnormal levels of other serum enzymes: Secondary | ICD-10-CM | POA: Diagnosis not present

## 2021-06-20 NOTE — Telephone Encounter (Signed)
Called received from [place], at [facility] ultrasound department for stat results of left leg doppler study. Patient in for ultrasound study due to pain and swelling in left leg. Results are negative for DVT in left leg. Patient is leaving [facility] now. Please notify patient of results .

## 2021-06-20 NOTE — Progress Notes (Signed)
Name: Andrea Randolph   MRN: [id number]    DOB: 14-Dec-1955   Date:06/20/2021       Progress Note  Chief Complaint  Patient presents with   Knee Pain    Left x3 weeks     Subjective:   Andrea Randolph is a 65 y.o. female, presents to clinic for knee pain swelling, myalgias arthralgias ongoing for more than a months  She recently saw PCP Dr. Ancil Boozer for joint pain which she thought was med SE - she states statins and her cancer meds - she states they were both switched but still has joint pain and muscle pain and she is not currently taking pravastatin or exemestane  She notes her joints and body hurting for weeks, but left knee has gotten worse w/o injury or strain It is swollen, painful, worse with flexion or ambulation, she has been wearing a brace and elevating it with only minimal improvement She denies any redness, pedal edema She has some associated calf and lower leg pain Pain is most severe located to bilateral anterior knee She is concerned with family hx of gout - though never had gout before or labs  She denies CP SOB tachycardia No hx of autoimmune or rheumatic work up She asks about arthritis and OA symptoms, denies past xrays     Current Outpatient Medications:    aspirin 81 MG tablet, Take 81 mg by mouth daily., Disp: , Rfl:    Cholecalciferol (VITAMIN D3) 1000 units CAPS, Take 1,000 Units by mouth daily., Disp: , Rfl:    dapagliflozin propanediol (FARXIGA) 10 MG TABS tablet, TAKE 1 TABLET BY MOUTH ONCE DAILY BEFORE BREAKFAST IN PLACE OF XIGDUO., Disp: 90 tablet, Rfl: 0   diclofenac Sodium (VOLTAREN) 1 % GEL, Apply 2 g topically 4 (four) times daily as needed., Disp: 100 g, Rfl: 2   exemestane (AROMASIN) 25 MG tablet, Take 1 tablet (25 mg total) by mouth daily after breakfast., Disp: 30 tablet, Rfl: 2   ezetimibe (ZETIA) 10 MG tablet, Take 1 tablet (10 mg total) by mouth daily., Disp: 30 tablet, Rfl: 0   glucose blood test strip, FREESTYLE LITE TEST (In Vitro Strip)  use  as directed for 0 days  Quantity: 200.00;  Refills: 3   Ordered :25-Nov-2014  Vonna Kotyk ;  Started 30-Dec-2008 Active Comments: DX: 250.00, Disp: , Rfl:    ketoconazole (NIZORAL) 2 % cream, Apply 1 application topically daily. (Patient taking differently: Apply 1 application topically daily as needed for irritation.), Disp: 60 g, Rfl: 0   levothyroxine (SYNTHROID) 125 MCG tablet, Take 1 tablet (125 mcg total) by mouth daily., Disp: 90 tablet, Rfl: 1   Olmesartan-amLODIPine-HCTZ 40-5-25 MG TABS, TAKE 1 TABLET DAILY (SCHEDULE APPOINTMENT FOR FURTHER REFILLS) (Patient taking differently: Take 1 tablet by mouth daily.), Disp: 90 tablet, Rfl: 3   pravastatin (PRAVACHOL) 20 MG tablet, TAKE 1 TABLET BY MOUTH ONCE DAILY IN THE EVENING (Patient taking differently: Take 20 mg by mouth every evening.), Disp: 30 tablet, Rfl: 5   Semaglutide (RYBELSUS) 7 MG TABS, Take 1 tablet by mouth daily., Disp: 90 tablet, Rfl: 1  Patient Active Problem List   Diagnosis Date Noted   Aromatase inhibitor use 05/30/2021   Complaints of total body pain 05/30/2021   Malignant 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, estrogen receptor positive ([place]) 02/20/2021   Thrombocytosis 11/01/2019   Carpal tunnel syndrome, bilateral 02/19/2019   Trigger finger of right thumb 02/19/2019   Flexor tenosynovitis  of finger 02/19/2019   Severe obesity (BMI 35.0-35.9 with comorbidity) ([place]) 12/29/2018   Mild concentric left ventricular hypertrophy (LVH) 12/24/2017   Mild aortic stenosis by prior echocardiogram 12/24/2017   Mild aortic regurgitation 12/24/2017   CKD (chronic kidney disease), stage I 06/22/2016   Uterine fibroid 01/11/2016   Allergic rhinitis, seasonal 05/24/2015   Benign essential HTN 05/24/2015   Chronic constipation 05/24/2015   Diabetes mellitus with renal manifestation ([place]) 05/24/2015   Dyslipidemia 05/24/2015   Post-surgical hypothyroidism 05/24/2015   Mixed incontinence 05/24/2015    Microalbuminuria 05/24/2015   Menopause 05/24/2015   Obstructive apnea 05/24/2015   Morbid obesity ([place]) 05/24/2015   Hypertensive retinopathy 05/24/2015   Vitamin D deficiency 05/24/2015   Status post total replacement of right shoulder 03/11/2015    Past Surgical History:  Procedure Laterality Date   BREAST BIOPSY Left 90's   benign core needle    BREAST BIOPSY Right 02/07/2021   [place] Bx, Q-clip, path pending    BREAST LUMPECTOMY WITH SENTINEL LYMPH NODE BIOPSY Right 03/06/2021   Procedure: BREAST LUMPECTOMY WITH SENTINEL LYMPH NODE BX;  Surgeon: Robert Bellow, MD;  Location: [facility] ORS;  Service: General;  Laterality: Right;   COLONOSCOPY WITH PROPOFOL N/A 01/26/2019   Procedure: COLONOSCOPY WITH PROPOFOL;  Surgeon: Lin Landsman, MD;  Location: [facility];  Service: Gastroenterology;  Laterality: N/A;   FOOT SURGERY Bilateral    heel spurs   JOINT REPLACEMENT     TOTAL SHOULDER REPLACEMENT Right 01/25/2015   Dr. Roland Rack   TOTAL THYROIDECTOMY  08/01/2018    Family History  Problem Relation Age of Onset   Heart disease Mother    Heart disease Father    Kidney disease Sister    Hypertension Sister    Diabetes Sister    Hypertension Brother    CVA Brother    Breast cancer Maternal Aunt 82   Hypertension Sister    Kidney failure Sister    Ovarian cancer Neg Hx    Colon cancer Neg Hx     Social History   Tobacco Use   Smoking status: Never   Smokeless tobacco: Never  Vaping Use   Vaping Use: Never used  Substance Use Topics   Alcohol use: No    Alcohol/week: 0.0 standard drinks   Drug use: No     Allergies  Allergen Reactions   Rosuvastatin Other (See Comments)    Myalgia     Health Maintenance  Topic Date Due   Pneumococcal Vaccine 73-78 Years old (3 - PPSV23 or PCV20) 08/28/2016   FOOT EXAM  06/20/2021   COVID-19 Vaccine (1) 07/06/2021 (Originally 10/20/1961)   INFLUENZA VACCINE  08/06/2021 (Originally 06/12/2021)   HEMOGLOBIN A1C  11/29/2021    COLONOSCOPY (Pts 45-65yr Insurance coverage will need to be confirmed)  01/25/2022   OPHTHALMOLOGY EXAM  03/28/2022   MAMMOGRAM  01/13/2023   PAP SMEAR-Modifier  01/27/2024   TETANUS/TDAP  12/28/2029   PNEUMOCOCCAL POLYSACCHARIDE VACCINE AGE 37-64 HIGH RISK  Completed   Hepatitis C Screening  Completed   HIV Screening  Completed   Zoster Vaccines- Shingrix  Completed   HPV VACCINES  Aged Out    Chart Review Today: I personally reviewed active problem list, medication list, allergies, family history, social history, health maintenance, notes from last encounter, lab results, imaging with the patient/caregiver today.   Review of Systems  Constitutional: Negative.   Eyes: Negative.   Respiratory: Negative.  Negative for cough, chest tightness, shortness of breath and wheezing.  Cardiovascular: Negative.  Negative for chest pain, palpitations and leg swelling.  Gastrointestinal: Negative.   Endocrine: Negative.   Genitourinary: Negative.   Musculoskeletal:  Positive for arthralgias, joint swelling and myalgias.  Skin: Negative.  Negative for color change, pallor and rash.  Allergic/Immunologic: Negative.   Neurological: Negative.   Hematological: Negative.   Psychiatric/Behavioral: Negative.    All other systems reviewed and are negative.   Objective:   Vitals:   06/20/21 1322  BP: 124/68  Pulse: 90  Resp: 16  Temp: 98.1 F (36.7 C)  SpO2: 99%  Weight: 216 lb 4.8 oz (98.1 kg)  Height: '5\' 4"'$  (1.626 m)    Body mass index is 37.13 kg/m.  Physical Exam Vitals reviewed.  Constitutional:      General: She is not in acute distress.    Appearance: Normal appearance. She is obese. She is not ill-appearing, toxic-appearing or diaphoretic.  HENT:     Head: Normocephalic and atraumatic.     Right Ear: External ear normal.     Left Ear: External ear normal.  Eyes:     General: No scleral icterus.       Right eye: No discharge.        Left eye: No discharge.   Cardiovascular:     Rate and Rhythm: Normal rate and regular rhythm.  Pulmonary:     Effort: Pulmonary effort is normal. No respiratory distress.  Musculoskeletal:        General: Swelling and tenderness present.     Right knee:     Instability Tests: Anterior drawer test negative. Medial McMurray test negative and lateral McMurray test negative.     Left knee: Swelling and effusion present. No deformity, erythema, ecchymosis or crepitus. Decreased range of motion. Tenderness present over the MCL and LCL. No ACL, PCL or patellar tendon tenderness. No LCL laxity, MCL laxity or ACL laxity.Normal patellar mobility. Normal pulse.     Instability Tests: Anterior drawer test negative. Medial McMurray test negative and lateral McMurray test negative.     Right lower leg: No swelling or tenderness. No edema.     Left lower leg: No swelling or tenderness. No edema.     Comments: No left popliteal fossa ttp, no palpable cord, normal extension, limited flexion No left pedal or pretibial edema Calf circumference measured bilaterally and 41 R 41 L, no asymmetry   Skin:    General: Skin is warm and dry.     Coloration: Skin is not jaundiced or pale.     Findings: No bruising, erythema or lesion.  Neurological:     Mental Status: She is alert.     Sensory: No sensory deficit.     Motor: No weakness.     Gait: Gait abnormal.  Psychiatric:        Mood and Affect: Mood normal.        Behavior: Behavior normal.        Assessment & Plan:     ICD-10-CM   1. Pain and swelling of left knee  M25.562 DG Knee Complete 4 Views Left   M25.462 US Venous Img Lower Unilateral Left (DVT)    CBC with Differential/Platelet    Uric acid    ANA    C-reactive protein    Rheumatoid factor    Sedimentation rate    CK (Creatine Kinase)   appears to have effusion, with CA hx and meds r/o DVT, xray to eval joint space, may be OA flare, no injury  2. Polyarthralgia  M25.50 CBC with Differential/Platelet     Uric acid    ANA    C-reactive protein    Rheumatoid factor    Sedimentation rate    CK (Creatine Kinase)   multiple joints and muscles sore over the past month - rheum screening    3. Elevated CK  R74.8    pt stopped pravastatin, could not tolerate at all, recheck CK     4. Primary osteoarthritis of both knees  M17.0    may be worsening OA in left knee, worse with use/weightbearing, effusion w/o any injury, no instability or finding concerning for ligament/meniscus tear     Pending results - may need ortho - or could follow up pcp for joint aspiration/injection etc - may want surgical consult if not improving  Will forward CK and statin intolerance to PCP  F/up as needed with PCP   Delsa Grana, PA-C 06/20/21 2:13 PM

## 2021-06-21 ENCOUNTER — Other Ambulatory Visit: Payer: Self-pay | Admitting: Family Medicine

## 2021-06-21 MED ORDER — PREDNISONE 20 MG PO TABS: 40.0000 mg | ORAL_TABLET | Freq: Every day | ORAL | 0 refills | Status: AC

## 2021-06-22 LAB — SEDIMENTATION RATE: Sed Rate: 11 mm/h (ref 0–30)

## 2021-06-22 LAB — CBC WITH DIFFERENTIAL/PLATELET
Absolute Monocytes: 301 cells/uL (ref 200–950)
Basophils Absolute: 32 cells/uL (ref 0–200)
Basophils Relative: 0.9 %
Eosinophils Absolute: 189 cells/uL (ref 15–500)
Eosinophils Relative: 5.4 %
HCT: 41.4 % (ref 35.0–45.0)
Hemoglobin: 13.5 g/dL (ref 11.7–15.5)
Lymphs Abs: 1320 cells/uL (ref 850–3900)
MCH: 27.1 pg (ref 27.0–33.0)
MCHC: 32.6 g/dL (ref 32.0–36.0)
MCV: 83 fL (ref 80.0–100.0)
MPV: 9.3 fL (ref 7.5–12.5)
Monocytes Relative: 8.6 %
Neutro Abs: 1659 cells/uL (ref 1500–7800)
Neutrophils Relative %: 47.4 %
Platelets: 339 10*3/uL (ref 140–400)
RBC: 4.99 10*6/uL (ref 3.80–5.10)
RDW: 14.5 % (ref 11.0–15.0)
Total Lymphocyte: 37.7 %
WBC: 3.5 10*3/uL — ABNORMAL LOW (ref 3.8–10.8)

## 2021-06-22 LAB — ANTI-NUCLEAR AB-TITER (ANA TITER): ANA Titer 1: 1:40 {titer} — ABNORMAL HIGH

## 2021-06-22 LAB — CK: Total CK: 233 U/L — ABNORMAL HIGH (ref 29–143)

## 2021-06-22 LAB — RHEUMATOID FACTOR: Rheumatoid fact SerPl-aCnc: <14 IU/mL (ref <14)

## 2021-06-22 LAB — C-REACTIVE PROTEIN: CRP: 2.4 mg/L (ref <8.0)

## 2021-06-22 LAB — ANA: Anti Nuclear Antibody (ANA): POSITIVE — AB

## 2021-06-22 LAB — URIC ACID: Uric Acid, Serum: 6.6 mg/dL (ref 2.5–7.0)

## 2021-06-26 ENCOUNTER — Other Ambulatory Visit: Payer: Self-pay | Admitting: Family Medicine

## 2021-06-26 DIAGNOSIS — I119 Hypertensive heart disease without heart failure: Secondary | ICD-10-CM

## 2021-06-26 MED ORDER — OLMESARTAN-AMLODIPINE-HCTZ 40-5-25 MG PO TABS
ORAL_TABLET | ORAL | 1 refills | Status: DC
Start: 2021-06-26 — End: 2021-07-10

## 2021-06-26 NOTE — Telephone Encounter (Signed)
Medication: Olmesartan-amLODIPine-HCTZ 40-5-25 MG TABS [id number]  Apt 06/20/21  Has the patient contacted their pharmacy? YES (Agent: If no, request that the patient contact the pharmacy for the refill.) (Agent: If yes, when and what did the pharmacy advise?)  Preferred Pharmacy (with phone number or street name): [facility] (N), [place] - [address] [place] ([place]) [place] [address] Phone: [phone] Fax: [phone] Hours: Not open 24 hours    Agent: Please be advised that RX refills may take up to 3 business days. We ask that you follow-up with your pharmacy.

## 2021-07-04 ENCOUNTER — Inpatient Hospital Stay: Payer: BC Managed Care – PPO | Attending: Oncology

## 2021-07-04 ENCOUNTER — Telehealth: Payer: Self-pay | Admitting: Family Medicine

## 2021-07-04 ENCOUNTER — Inpatient Hospital Stay (HOSPITAL_BASED_OUTPATIENT_CLINIC_OR_DEPARTMENT_OTHER): Payer: BC Managed Care – PPO | Admitting: Oncology

## 2021-07-04 ENCOUNTER — Other Ambulatory Visit: Payer: Self-pay

## 2021-07-04 ENCOUNTER — Encounter: Payer: Self-pay | Admitting: Oncology

## 2021-07-04 VITALS — BP 151/75 | HR 74 | Temp 98.4°F | Resp 18 | Wt 221.6 lb

## 2021-07-04 DIAGNOSIS — C50211 Malignant neoplasm of upper-inner quadrant of right female breast: Secondary | ICD-10-CM | POA: Diagnosis not present

## 2021-07-04 DIAGNOSIS — I129 Hypertensive chronic kidney disease with stage 1 through stage 4 chronic kidney disease, or unspecified chronic kidney disease: Secondary | ICD-10-CM | POA: Diagnosis not present

## 2021-07-04 DIAGNOSIS — M25562 Pain in left knee: Secondary | ICD-10-CM

## 2021-07-04 DIAGNOSIS — Z7982 Long term (current) use of aspirin: Secondary | ICD-10-CM | POA: Insufficient documentation

## 2021-07-04 DIAGNOSIS — N1831 Chronic kidney disease, stage 3a: Secondary | ICD-10-CM

## 2021-07-04 DIAGNOSIS — E785 Hyperlipidemia, unspecified: Secondary | ICD-10-CM | POA: Insufficient documentation

## 2021-07-04 DIAGNOSIS — Z79899 Other long term (current) drug therapy: Secondary | ICD-10-CM | POA: Diagnosis not present

## 2021-07-04 DIAGNOSIS — Z170 Estrogen receptor positive status [ER+]: Secondary | ICD-10-CM

## 2021-07-04 DIAGNOSIS — Z79811 Long term (current) use of aromatase inhibitors: Secondary | ICD-10-CM | POA: Insufficient documentation

## 2021-07-04 DIAGNOSIS — G4733 Obstructive sleep apnea (adult) (pediatric): Secondary | ICD-10-CM | POA: Diagnosis not present

## 2021-07-04 DIAGNOSIS — Z7984 Long term (current) use of oral hypoglycemic drugs: Secondary | ICD-10-CM | POA: Diagnosis not present

## 2021-07-04 DIAGNOSIS — G8929 Other chronic pain: Secondary | ICD-10-CM | POA: Diagnosis not present

## 2021-07-04 DIAGNOSIS — D631 Anemia in chronic kidney disease: Secondary | ICD-10-CM | POA: Diagnosis not present

## 2021-07-04 DIAGNOSIS — E039 Hypothyroidism, unspecified: Secondary | ICD-10-CM | POA: Insufficient documentation

## 2021-07-04 DIAGNOSIS — E1122 Type 2 diabetes mellitus with diabetic chronic kidney disease: Secondary | ICD-10-CM | POA: Diagnosis not present

## 2021-07-04 LAB — CBC WITH DIFFERENTIAL/PLATELET
Abs Immature Granulocytes: 0.02 10*3/uL (ref 0.00–0.07)
Basophils Absolute: 0 10*3/uL (ref 0.0–0.1)
Basophils Relative: 1 %
Eosinophils Absolute: 0.1 10*3/uL (ref 0.0–0.5)
Eosinophils Relative: 3 %
HCT: 41.3 % (ref 36.0–46.0)
Hemoglobin: 13.5 g/dL (ref 12.0–15.0)
Immature Granulocytes: 1 %
Lymphocytes Relative: 29 %
Lymphs Abs: 1.2 10*3/uL (ref 0.7–4.0)
MCH: 27.2 pg (ref 26.0–34.0)
MCHC: 32.7 g/dL (ref 30.0–36.0)
MCV: 83.1 fL (ref 80.0–100.0)
Monocytes Absolute: 0.4 10*3/uL (ref 0.1–1.0)
Monocytes Relative: 9 %
Neutro Abs: 2.5 10*3/uL (ref 1.7–7.7)
Neutrophils Relative %: 57 %
Platelets: 312 10*3/uL (ref 150–400)
RBC: 4.97 MIL/uL (ref 3.87–5.11)
RDW: 14.9 % (ref 11.5–15.5)
WBC: 4.3 10*3/uL (ref 4.0–10.5)
nRBC: 0 % (ref 0.0–0.2)

## 2021-07-04 LAB — COMPREHENSIVE METABOLIC PANEL WITH GFR
ALT: 24 U/L (ref 0–44)
AST: 22 U/L (ref 15–41)
Albumin: 3.9 g/dL (ref 3.5–5.0)
Alkaline Phosphatase: 40 U/L (ref 38–126)
Anion gap: 8 (ref 5–15)
BUN: 13 mg/dL (ref 8–23)
CO2: 26 mmol/L (ref 22–32)
Calcium: 8.9 mg/dL (ref 8.9–10.3)
Chloride: 103 mmol/L (ref 98–111)
Creatinine, Ser: 1.3 mg/dL — ABNORMAL HIGH (ref 0.44–1.00)
GFR, Estimated: 46 mL/min — ABNORMAL LOW (ref >60)
Glucose, Bld: 112 mg/dL — ABNORMAL HIGH (ref 70–99)
Potassium: 3.8 mmol/L (ref 3.5–5.1)
Sodium: 137 mmol/L (ref 135–145)
Total Bilirubin: 0.7 mg/dL (ref 0.3–1.2)
Total Protein: 7.3 g/dL (ref 6.5–8.1)

## 2021-07-04 NOTE — Progress Notes (Signed)
Hematology/Oncology follow up  note [facility] Telephone:(336) [phone] [phone]   Patient Care Team: Steele Sizer, MD as PCP - General [name], MD as PCP - Cardiology (Cardiology) Rico Junker, RN as Oncology Nurse Navigator  REFERRING PROVIDER: Steele Sizer, MD  CHIEF COMPLAINTS/REASON FOR VISIT:  Follow up of breast cancer.   HISTORY OF PRESENTING ILLNESS:   Andrea Randolph is a  65 y.o.  female with PMH listed below was seen in consultation at the request of  Steele Sizer, MD  for evaluation of breast cancer.   01/12/21 screening mammogram showed focal asymmetry with possible distortion.  01/26/21 right diagnostic mammogram and US showed 3x5x12m hypoechoic mass, s/p biopsy on 02/07/21 Pathology showed invasive mammary carcinoma, grade 2, ER90% PR 90%, HER2 negative.   Patient was seen by Dr.Byrnett yesterday. She presents to establish care with oncology Accompanied by her daughter  Menarche 89years with Age at first childbirth 89  OCP use for about 20 years Denies any estrogen replacement. LMP in 571s Denies any previous chest radiation. History of left breast biopsy previously. Family history Father for maternal aunt who was diagnosed of breast cancer at age of 577  03/06/2021 lumpectomy and sentinel lymph node biopsy. Pathology showed invasive mammary carcinoma, no special type, DCIS in situ, intermediate grade.  All 3 sentinel lymph nodes were negative.  1 nonsentinel lymph node was negative.  Margins for invasive carcinoma was negative.  Margins for DCIS was unifocal positive. Patient declined reexcision.Patient's case was discussed on breast cancer tumor board on 03/20/2021.  # 05/03/2021 finished adjuvant radiation.  05/09/2021, patient started on Arimidex.  Patient called to report that she has experienced significant leg pain, back pain, neck pain shortly after starting Arimidex.  INTERVAL HISTORY Andrea THANGis a  65y.o. female who has above history reviewed by me today presents for follow up visit for management of breast cancer Patient was recommended to try Aromasin and prescription was sent to her pharmacy. Patient reports that she has not started Aromasin yet due to the concern of joint pain as side effects During interval she has developed left knee pain and swelling.  06/20/2021, left knee x-ray showed diffuse joint space narrowing of the left knee with spurring at the lateral compartment.  Small joint effusion. Patient was recommended for orthopedic surgeon evaluation and the patient preferred to wait on also referral and will try prednisone treatments.  Due to the left knee pain, she has not started Aromasin yet.   Review of Systems  Constitutional:  Negative for appetite change, chills, fatigue and fever.  HENT:   Negative for hearing loss and voice change.   Eyes:  Negative for eye problems.  Respiratory:  Negative for chest tightness and cough.   Cardiovascular:  Negative for chest pain.  Gastrointestinal:  Negative for abdominal distention, abdominal pain and blood in stool.  Endocrine: Negative for hot flashes.  Genitourinary:  Negative for difficulty urinating and frequency.   Musculoskeletal:  Positive for arthralgias.  Skin:  Negative for itching and rash.  Neurological:  Negative for extremity weakness.  Hematological:  Negative for adenopathy.  Psychiatric/Behavioral:  Negative for confusion. The patient is not nervous/anxious.    MEDICAL HISTORY:  Past Medical History:  Diagnosis Date   Allergy    Anemia    Arthritis of right shoulder region    Dr. STamala Julian  Chronic insomnia    Diabetes mellitus without complication ([facility]    Family history  of adverse reaction to anesthesia    sister with Nausea   GERD (gastroesophageal reflux disease)    Goiter    Dr. Gabriel Carina   Heart murmur    Hyperlipidemia    Hypertension    Hypertensive retinopathy of left eye    Hypothyroidism, adult     Dr. Gabriel Carina   Inflammation of urethra    Menopause    Microalbuminuria    DM   Mixed incontinence    Muscle cramps    Obesity    OSA (obstructive sleep apnea)    Radiculitis, lumbosacral    Vitamin D deficiency     SURGICAL HISTORY: Past Surgical History:  Procedure Laterality Date   BREAST BIOPSY Left 90's   benign core needle    BREAST BIOPSY Right 02/07/2021   [place] Bx, Q-clip, path pending    BREAST LUMPECTOMY WITH SENTINEL LYMPH NODE BIOPSY Right 03/06/2021   Procedure: BREAST LUMPECTOMY WITH SENTINEL LYMPH NODE BX;  Surgeon: Robert Bellow, MD;  Location: [facility] ORS;  Service: General;  Laterality: Right;   COLONOSCOPY WITH PROPOFOL N/A 01/26/2019   Procedure: COLONOSCOPY WITH PROPOFOL;  Surgeon: Lin Landsman, MD;  Location: [facility] ENDOSCOPY;  Service: Gastroenterology;  Laterality: N/A;   FOOT SURGERY Bilateral    heel spurs   JOINT REPLACEMENT     TOTAL SHOULDER REPLACEMENT Right 01/25/2015   Dr. Roland Rack   TOTAL THYROIDECTOMY  08/01/2018    SOCIAL HISTORY: Social History   Socioeconomic History   Marital status: Legally Separated    Spouse name: Not on file   Number of children: 1   Years of education: Not on file   Highest education level: 12th grade  Occupational History   Occupation: Cytogeneticist  Tobacco Use   Smoking status: Never   Smokeless tobacco: Never  Vaping Use   Vaping Use: Never used  Substance and Sexual Activity   Alcohol use: No    Alcohol/week: 0.0 standard drinks   Drug use: No   Sexual activity: Not Currently    Birth control/protection: Post-menopausal  Other Topics Concern   Not on file  Social History Narrative   Separated in around 2009.   She was living with her daughter, however currently moved in with her sister and nephew , but is looking for a house.    Social Determinants of Health   Financial Resource Strain: Low Risk    Difficulty of Paying Living Expenses: Not hard at all  Food Insecurity: No Food  Insecurity   Worried About Charity fundraiser in the Last Year: Never true   [place] in the Last Year: Never true  Transportation Needs: No Transportation Needs   Lack of Transportation (Medical): No   Lack of Transportation (Non-Medical): No  Physical Activity: Sufficiently Active   Days of Exercise per Week: 3 days   Minutes of Exercise per Session: 60 min  Stress: No Stress Concern Present   Feeling of Stress : Only a little  Social Connections: Moderately Integrated   Frequency of Communication with Friends and Family: More than three times a week   Frequency of Social Gatherings with Friends and Family: More than three times a week   Attends Religious Services: More than 4 times per year   Active Member of [employer] or Organizations: Yes   Attends Music therapist: More than 4 times per year   Marital Status: Separated  Intimate Partner Violence: Not At Risk   Fear of Current or  Ex-Partner: No   Emotionally Abused: No   Physically Abused: No   Sexually Abused: No    FAMILY HISTORY: Family History  Problem Relation Age of Onset   Heart disease Mother    Heart disease Father    Kidney disease Sister    Hypertension Sister    Diabetes Sister    Hypertension Brother    CVA Brother    Breast cancer Maternal Aunt 51   Hypertension Sister    Kidney failure Sister    Ovarian cancer Neg Hx    Colon cancer Neg Hx     ALLERGIES:  is allergic to rosuvastatin.  MEDICATIONS:  Current Outpatient Medications  Medication Sig Dispense Refill   aspirin 81 MG tablet Take 81 mg by mouth daily.     Cholecalciferol (VITAMIN D3) 1000 units CAPS Take 1,000 Units by mouth daily.     dapagliflozin propanediol (FARXIGA) 10 MG TABS tablet TAKE 1 TABLET BY MOUTH ONCE DAILY BEFORE BREAKFAST IN PLACE OF XIGDUO. 90 tablet 0   diclofenac Sodium (VOLTAREN) 1 % GEL Apply 2 g topically 4 (four) times daily as needed. 100 g 2   exemestane (AROMASIN) 25 MG tablet Take 1 tablet (25  mg total) by mouth daily after breakfast. 30 tablet 2   ezetimibe (ZETIA) 10 MG tablet Take 1 tablet (10 mg total) by mouth daily. 30 tablet 0   glucose blood test strip FREESTYLE LITE TEST (In Vitro Strip)  use as directed for 0 days  Quantity: 200.00;  Refills: 3   Ordered :25-Nov-2014  Vonna Kotyk ;  Started 30-Dec-2008 Active Comments: DX: 250.00     ketoconazole (NIZORAL) 2 % cream Apply 1 application topically daily. (Patient taking differently: Apply 1 application topically daily as needed for irritation.) 60 g 0   levothyroxine (SYNTHROID) 125 MCG tablet Take 1 tablet (125 mcg total) by mouth daily. 90 tablet 1   Olmesartan-amLODIPine-HCTZ 40-5-25 MG TABS TAKE 1 TABLET DAILY (SCHEDULE APPOINTMENT FOR FURTHER REFILLS) 90 tablet 1   Semaglutide (RYBELSUS) 7 MG TABS Take 1 tablet by mouth daily. 90 tablet 1   pravastatin (PRAVACHOL) 20 MG tablet TAKE 1 TABLET BY MOUTH ONCE DAILY IN THE EVENING (Patient not taking: Reported on 07/04/2021) 30 tablet 5   No current facility-administered medications for this visit.     PHYSICAL EXAMINATION: ECOG PERFORMANCE STATUS: 0 - Asymptomatic Vitals:   07/04/21 1008  BP: (!) 151/75  Pulse: 74  Resp: 18  Temp: 98.4 F (36.9 C)   Filed Weights   07/04/21 1008  Weight: 221 lb 9.6 oz (100.5 kg)    Physical Exam Constitutional:      General: She is not in acute distress. HENT:     Head: Normocephalic and atraumatic.  Eyes:     General: No scleral icterus. Cardiovascular:     Rate and Rhythm: Normal rate and regular rhythm.     Heart sounds: Normal heart sounds.  Pulmonary:     Effort: Pulmonary effort is normal. No respiratory distress.     Breath sounds: No wheezing.  Abdominal:     General: Bowel sounds are normal. There is no distension.     Palpations: Abdomen is soft.  Musculoskeletal:        General: No deformity. Normal range of motion.     Cervical back: Normal range of motion and neck supple.  Skin:    General:  Skin is warm and dry.     Findings: No erythema or rash.  Neurological:  Mental Status: She is alert and oriented to person, place, and time. Mental status is at baseline.     Cranial Nerves: No cranial nerve deficit.     Coordination: Coordination normal.  Psychiatric:        Mood and Affect: Mood normal.    LABORATORY DATA:  I have reviewed the data as listed Lab Results  Component Value Date   WBC 4.3 07/04/2021   HGB 13.5 07/04/2021   HCT 41.3 07/04/2021   MCV 83.1 07/04/2021   PLT 312 07/04/2021   Recent Labs    10/20/20 1045 01/26/21 0949 05/09/21 1351 05/29/21 0923 07/04/21 0942  NA 143 141 137 139 137  K 4.0 4.1 3.4* 4.1 3.8  CL 105 103 99 102 103  CO2 30 29 29 30 26   GLUCOSE 85 92 128* 100* 112*  BUN 19 17 18 15 13   CREATININE 1.04* 1.15* 1.12* 1.11* 1.30*  CALCIUM 9.8 9.7 8.8* 9.5 8.9  GFRNONAA 57* 51* 55*  --  46*  GFRAA 66 59*  --   --   --   PROT 7.1 7.0 7.0 7.2 7.3  ALBUMIN  --   --  3.9  --  3.9  AST 17 17 18 21 22   ALT 16 16 16 23 24   ALKPHOS  --   --  43  --  40  BILITOT 0.4 0.5 0.5 0.3 0.7    Iron/TIBC/Ferritin/ %Sat    Component Value Date/Time   FERRITIN 27 08/29/2015 1135      RADIOGRAPHIC STUDIES: I have personally reviewed the radiological images as listed and agreed with the findings in the report. US Venous Img Lower Unilateral Left (DVT)  Result Date: 06/20/2021 CLINICAL DATA:  Left knee pain and swelling EXAM: LEFT LOWER EXTREMITY VENOUS DOPPLER ULTRASOUND TECHNIQUE: Gray-scale sonography with graded compression, as well as color Doppler and duplex ultrasound were performed to evaluate the lower extremity deep venous systems from the level of the common femoral vein and including the common femoral, femoral, profunda femoral, popliteal and calf veins including the posterior tibial, peroneal and gastrocnemius veins when visible. The superficial great saphenous vein was also interrogated. Spectral Doppler was utilized to evaluate flow  at rest and with distal augmentation maneuvers in the common femoral, femoral and popliteal veins. COMPARISON:  None. FINDINGS: Contralateral Common Femoral Vein: Respiratory phasicity is normal and symmetric with the symptomatic side. No evidence of thrombus. Normal compressibility. Common Femoral Vein: No evidence of thrombus. Normal compressibility, respiratory phasicity and response to augmentation. Saphenofemoral Junction: No evidence of thrombus. Normal compressibility and flow on color Doppler imaging. Profunda Femoral Vein: No evidence of thrombus. Normal compressibility and flow on color Doppler imaging. Femoral Vein: No evidence of thrombus. Normal compressibility, respiratory phasicity and response to augmentation. Popliteal Vein: No evidence of thrombus. Normal compressibility, respiratory phasicity and response to augmentation. Calf Veins: No evidence of thrombus. Normal compressibility and flow on color Doppler imaging. IMPRESSION: No evidence of deep venous thrombosis. Electronically Signed   By: Jerilynn Mages.  Shick M.D.   On: 06/20/2021 16:02   DG Knee Complete 4 Views Left  Result Date: 06/21/2021 CLINICAL DATA:  LEFT knee pain and swelling beginning over a month ago, no known injury EXAM: LEFT KNEE - COMPLETE 4+ VIEW COMPARISON:  None FINDINGS: Osseous mineralization normal. Diffuse joint space narrowing LEFT knee with spurring at lateral compartment. No acute fracture, dislocation, or bone destruction. Small joint effusion present. IMPRESSION: Degenerative changes LEFT knee with small joint effusion. No acute osseous  findings. Electronically Signed   By: Lavonia Dana M.D.   On: 06/21/2021 13:40       ASSESSMENT & PLAN:  1. Malignant 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, estrogen receptor positive ([place])   2. Chronic pain of left knee   3. Anemia in stage 3a chronic kidney disease ([place])   Cancer Staging Malignant 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,  estrogen receptor positive ([place]) Staging form: Breast, AJCC 8th Edition - Pathologic stage from 03/20/2021: Stage IA (pT1a, pN0, cM0, G2, ER+, PR+, HER2-) - Signed by Earlie Server, MD on 03/20/2021   Right breast invasive carcinoma.  pT1a pN0, ER positive, PR positive, HER2 negative. Focal DCIS margin positive.Patient declined reexcision.s/p adjuvant radiation.  Patient did not tolerate Arimidex due to severe body pain Recommended patient to switch to alternative with Aromasin 25 mg daily. She has not started yet.  She has concerned about the potential worsening of her joint pain .  Knee pain with small effusion.  I recommend patient to consider orthopedic evaluation. Patient has plan to try Aromasin after her knee pain is controlled  Recommend calcium 1200 milligrams daily Normal baseline bone density.  Chronic kidney disease, stage III.  Creatinine increased to 1.3.  Most likely due to use of NSAIDs.  Recommend patient to seek aggressive local treatment for her knee pain instead of taking systemic painkillers which may further worsen her kidney function.  Discussed with patient about alternatives of Aromasin 25 mg daily.  Patient is made aware that Aromasin may have overlapping side effects with Arimidex.  Some patient may tolerate 1 AI better than other AI's.  She agrees to try.  Prescription was sent to pharmacy.  All questions were answered. The patient knows to call the clinic with any problems questions or concerns.  cc Steele Sizer, MD    Return of visit: Follow-up in 3 months.  Earlie Server, MD, PhD Hematology Oncology [facility] Pager- [phone] 07/04/2021

## 2021-07-04 NOTE — Telephone Encounter (Signed)
Referral placed.

## 2021-07-04 NOTE — Telephone Encounter (Signed)
Next appt is 08/30/21

## 2021-07-04 NOTE — Telephone Encounter (Signed)
Copied from [place] [phone]. Topic: Referral - Request for Referral >> Jul 04, 2021 11:23 AM Leward Quan A wrote: Has patient seen PCP for this complaint? Yes.   *If NO, is insurance requiring patient see PCP for this issue before PCP can refer them? Referral for which specialty: Orthopedic Preferred provider/office: [facility]  Reason for referral: Left knee pain

## 2021-07-04 NOTE — Progress Notes (Signed)
Pt here for follow up. No new concerns voiced.   

## 2021-07-07 NOTE — Progress Notes (Signed)
Cardiology Office Note  Date:  07/10/2021   ID:  Andrea Randolph, Andrea Randolph [birth year], MRN [id number]  PCP:  Steele Sizer, MD   Chief Complaint  Patient presents with   12 month follow up     "Doing well." Medications reviewed by the patient verbally.     HPI:  [name] is a 65 year old woman with past medical history of Obstructive sleep apnea Hypothyroid Hypertension Hyperlipidemia  Diabetes  Chronic renal insufficiency Morbid obesity - Mild LVOT gradient with a peak gradient of 30 mm Hg. aortic valve disease,prior history of pulmonary hypertension, LVH on echo  invasive mammary carcinoma, grade 2, ER90% PR 90%, HER2 negative.  Lumpectomy  # 05/03/2021 finished adjuvant radiation.  05/09/2021, patient started on Arimidex.  had side effects Could not get out of bed, joints try Aromasin developed left knee pain and swelling  Pravastatin on hold, for months CK 233 two weeks ago ANA positive Took prednisone Knee pain  No edema, no SOB Weight down 15 pounds in one year  EKG personally reviewed by myself on todays visit NSR rate 59 bpm, T wave inversion anterolateral leads, unchanged  Prior studies reviewed with her Echo 2012 mild pulmonary hypertension Mild LVH and aortic stenosis Heart murmur  statin intolerances  Crestor: muscle pain Lipitor: Does not remember the side effect    PMH:   has a past medical history of Allergy, Anemia, Arthritis of right shoulder region, Chronic insomnia, Diabetes mellitus without complication ([place]), Family history of adverse reaction to anesthesia, GERD (gastroesophageal reflux disease), Goiter, Heart murmur, Hyperlipidemia, Hypertension, Hypertensive retinopathy of left eye, Hypothyroidism, adult, Inflammation of urethra, Menopause, Microalbuminuria, Mixed incontinence, Muscle cramps, Obesity, OSA (obstructive sleep apnea), Radiculitis, lumbosacral, and Vitamin D deficiency.  PSH:    Past Surgical History:  Procedure Laterality  Date   BREAST BIOPSY Left 90's   benign core needle    BREAST BIOPSY Right 02/07/2021   [place] Bx, Q-clip, path pending    BREAST LUMPECTOMY WITH SENTINEL LYMPH NODE BIOPSY Right 03/06/2021   Procedure: BREAST LUMPECTOMY WITH SENTINEL LYMPH NODE BX;  Surgeon: Robert Bellow, MD;  Location: [facility] ORS;  Service: General;  Laterality: Right;   COLONOSCOPY WITH PROPOFOL N/A 01/26/2019   Procedure: COLONOSCOPY WITH PROPOFOL;  Surgeon: Lin Landsman, MD;  Location: [facility] ENDOSCOPY;  Service: Gastroenterology;  Laterality: N/A;   FOOT SURGERY Bilateral    heel spurs   JOINT REPLACEMENT     TOTAL SHOULDER REPLACEMENT Right 01/25/2015   Dr. Roland Rack   TOTAL THYROIDECTOMY  08/01/2018    Current Outpatient Medications  Medication Sig Dispense Refill   aspirin 81 MG tablet Take 81 mg by mouth daily.     Cholecalciferol (VITAMIN D3) 1000 units CAPS Take 1,000 Units by mouth daily.     dapagliflozin propanediol (FARXIGA) 10 MG TABS tablet TAKE 1 TABLET BY MOUTH ONCE DAILY BEFORE BREAKFAST IN PLACE OF XIGDUO. 90 tablet 0   diclofenac Sodium (VOLTAREN) 1 % GEL Apply 2 g topically 4 (four) times daily as needed. 100 g 2   ezetimibe (ZETIA) 10 MG tablet Take 1 tablet (10 mg total) by mouth daily. 30 tablet 0   glucose blood test strip FREESTYLE LITE TEST (In Vitro Strip)  use as directed for 0 days  Quantity: 200.00;  Refills: 3   Ordered :25-Nov-2014  Vonna Kotyk ;  Started 30-Dec-2008 Active Comments: DX: 250.00     ketoconazole (NIZORAL) 2 % cream Apply 1 application topically daily. (Patient taking differently: Apply 1  application topically daily as needed for irritation.) 60 g 0   levothyroxine (SYNTHROID) 125 MCG tablet Take 1 tablet (125 mcg total) by mouth daily. 90 tablet 1   Olmesartan-amLODIPine-HCTZ 40-5-25 MG TABS TAKE 1 TABLET DAILY (SCHEDULE APPOINTMENT FOR FURTHER REFILLS) 90 tablet 1   Semaglutide (RYBELSUS) 7 MG TABS Take 1 tablet by mouth daily. 90 tablet 1   exemestane  (AROMASIN) 25 MG tablet Take 1 tablet (25 mg total) by mouth daily after breakfast. (Patient not taking: Reported on 07/10/2021) 30 tablet 2   pravastatin (PRAVACHOL) 20 MG tablet TAKE 1 TABLET BY MOUTH ONCE DAILY IN THE EVENING (Patient not taking: Reported on 07/10/2021) 30 tablet 5   No current facility-administered medications for this visit.     Allergies:   Rosuvastatin   Social History:  The patient  reports that she has never smoked. She has never used smokeless tobacco. She reports that she does not drink alcohol and does not use drugs.   Family History:   family history includes Breast cancer (age of onset: 96) in her maternal aunt; CVA in her brother; Diabetes in her sister; Heart disease in her father and mother; Hypertension in her brother, sister, and sister; Kidney disease in her sister; Kidney failure in her sister.    Review of Systems: Review of Systems  Constitutional: Negative.   Respiratory: Negative.    Cardiovascular: Negative.   Gastrointestinal: Negative.   Musculoskeletal:  Positive for joint pain.  Neurological: Negative.   Psychiatric/Behavioral: Negative.    All other systems reviewed and are negative.   PHYSICAL EXAM: VS:  BP 140/80 (BP Location: Left Arm, Patient Position: Sitting, Cuff Size: Large)   Pulse (!) 59   Ht 5' 4"  (1.626 m)   Wt 213 lb 8 oz (96.8 kg)   SpO2 98%   BMI 36.65 kg/m  , BMI Body mass index is 36.65 kg/m. Constitutional:  oriented to person, place, and time. No distress.  HENT:  Head: Grossly normal Eyes:  no discharge. No scleral icterus.  Neck: No JVD, no carotid bruits  Cardiovascular: Regular rate and rhythm, no murmurs appreciated Pulmonary/Chest: Clear to auscultation bilaterally, no wheezes or rails Abdominal: Soft.  no distension.  no tenderness.  Musculoskeletal: Normal range of motion Neurological:  normal muscle tone. Coordination normal. No atrophy Skin: Skin warm and dry Psychiatric: normal affect,  pleasant  Recent Labs: 05/29/2021: Magnesium 2.2; TSH 0.69 07/04/2021: ALT 24; BUN 13; Creatinine, Ser 1.30; Hemoglobin 13.5; Platelets 312; Potassium 3.8; Sodium 137    Lipid Panel Lab Results  Component Value Date   CHOL 186 01/26/2021   HDL 55 01/26/2021   LDLCALC 116 (H) 01/26/2021   TRIG 63 01/26/2021      Wt Readings from Last 3 Encounters:  07/10/21 213 lb 8 oz (96.8 kg)  07/04/21 221 lb 9.6 oz (100.5 kg)  06/20/21 216 lb 4.8 oz (98.1 kg)       ASSESSMENT AND PLAN:  Mild pulmonary hypertension ([place]) Not particularly elevated on echocardiogram 2012 Denies shortness of breath stable  Murmur - Mild LVOT gradient with a peak gradient of 30 mm Hg. On echo 2019 No significant aortic valve stenoisis Monitor sx for now  Mild aortic regurgitation - Plan: EKG 12-Lead No significant murmur  Type 2 diabetes mellitus with diabetic nephropathy, without long-term current use of insulin (HCC) Weight down  Benign essential HTN Blood pressure is well controlled on today's visit. No changes made to the medications.  Mild concentric left ventricular hypertrophy (  LVH) Monitor, BP control  Dyslipidemia Continue Zetia daily Off statin for now  Abnormal EKG  nonspecific T wave abnormality anterolateral leads, unchanged Possibly from mild LVH   Total encounter time more than 25 minutes  Greater than 50% was spent in counseling and coordination of care with the patient    No orders of the defined types were placed in this encounter.    Signed, Esmond Plants, M.D., Ph.D. 07/10/2021  [facility] [place], [place] [phone]

## 2021-07-10 ENCOUNTER — Encounter: Payer: Self-pay | Admitting: Cardiovascular Disease

## 2021-07-10 ENCOUNTER — Other Ambulatory Visit: Payer: Self-pay

## 2021-07-10 ENCOUNTER — Ambulatory Visit (INDEPENDENT_AMBULATORY_CARE_PROVIDER_SITE_OTHER): Payer: BC Managed Care – PPO | Admitting: Cardiovascular Disease

## 2021-07-10 VITALS — BP 140/80 | HR 59 | Ht 64.0 in | Wt 213.5 lb

## 2021-07-10 DIAGNOSIS — I351 Nonrheumatic aortic (valve) insufficiency: Secondary | ICD-10-CM

## 2021-07-10 DIAGNOSIS — I119 Hypertensive heart disease without heart failure: Secondary | ICD-10-CM

## 2021-07-10 DIAGNOSIS — E782 Mixed hyperlipidemia: Secondary | ICD-10-CM

## 2021-07-10 DIAGNOSIS — I10 Essential (primary) hypertension: Secondary | ICD-10-CM | POA: Diagnosis not present

## 2021-07-10 DIAGNOSIS — I350 Nonrheumatic aortic (valve) stenosis: Secondary | ICD-10-CM | POA: Diagnosis not present

## 2021-07-10 DIAGNOSIS — Z6839 Body mass index (BMI) 39.0-39.9, adult: Secondary | ICD-10-CM

## 2021-07-10 MED ORDER — DAPAGLIFLOZIN PROPANEDIOL 10 MG PO TABS: ORAL_TABLET | ORAL | 3 refills | Status: DC

## 2021-07-10 MED ORDER — EZETIMIBE 10 MG PO TABS: 10.0000 mg | ORAL_TABLET | Freq: Every day | ORAL | 5 refills | Status: DC

## 2021-07-10 MED ORDER — OLMESARTAN-AMLODIPINE-HCTZ 40-5-25 MG PO TABS: ORAL_TABLET | ORAL | 3 refills | Status: DC

## 2021-07-10 NOTE — Patient Instructions (Addendum)
Medication Instructions:  No changes  If you need a refill on your cardiac medications before your next appointment, please call your pharmacy.   Lab work: No new labs needed  Testing/Procedures: No new testing needed  Follow-Up: At [facility], you and your health needs are our priority.  As part of our continuing mission to provide you with exceptional heart care, we have created designated Provider Care Teams.  These Care Teams include your primary Cardiologist (physician) and Advanced Practice Providers (APPs -  Physician Assistants and Nurse Practitioners) who all work together to provide you with the care you need, when you need it.  You will need a follow up appointment in 12 months  Providers on your designated Care Team:   Christopher Berge, NP Ryan Dunn, PA-C Jacquelyn Visser, PA-C Cadence Furth, PA-C  COVID-19 Vaccine Information can be found at: [email or web address] For questions related to vaccine distribution or appointments, please email [email or web address] or call [phone].    

## 2021-07-11 DIAGNOSIS — C50911 Malignant neoplasm of unspecified site of right female breast: Secondary | ICD-10-CM | POA: Diagnosis not present

## 2021-07-20 DIAGNOSIS — M1712 Unilateral primary osteoarthritis, left knee: Secondary | ICD-10-CM | POA: Diagnosis not present

## 2021-08-30 ENCOUNTER — Ambulatory Visit (INDEPENDENT_AMBULATORY_CARE_PROVIDER_SITE_OTHER): Payer: BC Managed Care – PPO | Admitting: Family Medicine

## 2021-08-30 ENCOUNTER — Encounter: Payer: Self-pay | Admitting: Family Medicine

## 2021-08-30 ENCOUNTER — Other Ambulatory Visit: Payer: Self-pay

## 2021-08-30 VITALS — BP 124/80 | HR 96 | Temp 98.7°F | Resp 16 | Ht 64.0 in | Wt 220.0 lb

## 2021-08-30 DIAGNOSIS — N1831 Chronic kidney disease, stage 3a: Secondary | ICD-10-CM

## 2021-08-30 DIAGNOSIS — E1159 Type 2 diabetes mellitus with other circulatory complications: Secondary | ICD-10-CM

## 2021-08-30 DIAGNOSIS — C50411 Malignant neoplasm of upper-outer quadrant of right female breast: Secondary | ICD-10-CM

## 2021-08-30 DIAGNOSIS — E1169 Type 2 diabetes mellitus with other specified complication: Secondary | ICD-10-CM

## 2021-08-30 DIAGNOSIS — E785 Hyperlipidemia, unspecified: Secondary | ICD-10-CM

## 2021-08-30 DIAGNOSIS — E1122 Type 2 diabetes mellitus with diabetic chronic kidney disease: Secondary | ICD-10-CM

## 2021-08-30 DIAGNOSIS — E559 Vitamin D deficiency, unspecified: Secondary | ICD-10-CM

## 2021-08-30 DIAGNOSIS — I152 Hypertension secondary to endocrine disorders: Secondary | ICD-10-CM

## 2021-08-30 DIAGNOSIS — G4733 Obstructive sleep apnea (adult) (pediatric): Secondary | ICD-10-CM

## 2021-08-30 DIAGNOSIS — E890 Postprocedural hypothyroidism: Secondary | ICD-10-CM

## 2021-08-30 DIAGNOSIS — M170 Bilateral primary osteoarthritis of knee: Secondary | ICD-10-CM

## 2021-08-30 DIAGNOSIS — I10 Essential (primary) hypertension: Secondary | ICD-10-CM | POA: Diagnosis not present

## 2021-08-30 DIAGNOSIS — N183 Chronic kidney disease, stage 3 unspecified: Secondary | ICD-10-CM

## 2021-08-30 DIAGNOSIS — M2550 Pain in unspecified joint: Secondary | ICD-10-CM

## 2021-08-30 DIAGNOSIS — R768 Other specified abnormal immunological findings in serum: Secondary | ICD-10-CM

## 2021-08-30 DIAGNOSIS — E669 Obesity, unspecified: Secondary | ICD-10-CM | POA: Diagnosis not present

## 2021-08-30 DIAGNOSIS — N181 Chronic kidney disease, stage 1: Secondary | ICD-10-CM

## 2021-08-30 LAB — POCT GLYCOSYLATED HEMOGLOBIN (HGB A1C): Hemoglobin A1C: 6.1 % — AB (ref 4.0–5.6)

## 2021-08-30 MED ORDER — RYBELSUS 14 MG PO TABS: 14.0000 mg | ORAL_TABLET | Freq: Every day | ORAL | 1 refills | Status: DC

## 2021-08-30 NOTE — Progress Notes (Signed)
Name: Andrea Randolph   MRN: [id number]    DOB: Sep 07, 1956   Date:08/30/2021       Progress Note  Subjective  Chief Complaint  Follow Up  HPI  DMII: she is currently taking Farxiga and Rybelsus, off Metformin .  She eats breakfast but it is a light meal. A1C is still under control, today at 6.1 %  She has dyslipidemia, HTN and obesity. She is on ARB, and Zetia  She denies polyphagia, polydipsia or polyuria . She has been craving dark chocolate. We will adjust dose of Rybelsus to 14 mg since she is gaining weight and A1C is trending up.   LVH and aortic stenosis: under care of Dr. Rockey Situ, no chest pain, palpitation or sob .Denies orthopnea   Vitamin D low: she is now taking 2000 units daily Unchanged    Surgical hypothyroidism: she has been taking levothyroxine 125 mcg  daily and last TSH was at goal,   Morbid obesity: BMI above 35 with co-morbidities. She has DM, HTN, dyslipidemia and OA. She states not feeling well since she started on Arimdex, craving chocolate and gaining weight, frustrated    OA/Knee bilateral: saw Dr. Tamala Julian back in 09/22 and had steroid injection , pain is down to 2/10 on left knee, using Voltaren gel now since she got it from [facility]  Generalized arthralgias and myalgias: she noticed it after starting Arimidex earlier this year, she is now on Aromasin and also off statin therapy ( on hold per Dr. Rockey Situ ) pain is not as severe now but she is stiff when she gets up in the mornings and takes hours to loosen up her joints, unable to exercise. She had positive ANA, slightly elevated CK we will refer her to Rheumatologist    Hyperlipidemia: she was Pravastatin  and Zetia . Recently off statin therapy due to muscle aches and elevated CK, last LDL was 116, goal is below 70, she has been compliant with Zetia    OSA: she does not tolerate CPAP machine. Unchanged    Breast Cancer right : diagnosed by abnormal mammogram back in March 22, lumpectomy was done 03/06/2021 followed by  5 weeks of radiation and finished 05/03/2021. She was doing well until she started on Arimidex, she states her entire body hurts, joint are stiff and aching, difficulty sleeping, change in bowel movement, increase in appetite. She is now off Arimidex and on Aromasin 25 mg still feels stiff and not able to go to the gym and gaining weight but side effects not as severe.   Patient Active Problem List   Diagnosis Date Noted   Aromatase inhibitor use 05/30/2021   Complaints of total body pain 05/30/2021   Malignant 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, estrogen receptor positive ([place]) 02/20/2021   Thrombocytosis 11/01/2019   Carpal tunnel syndrome, bilateral 02/19/2019   Trigger finger of right thumb 02/19/2019   Flexor tenosynovitis of finger 02/19/2019   Severe obesity (BMI 35.0-35.9 with comorbidity) ([place]) 12/29/2018   Mild concentric left ventricular hypertrophy (LVH) 12/24/2017   Mild aortic stenosis by prior echocardiogram 12/24/2017   Mild aortic regurgitation 12/24/2017   CKD (chronic kidney disease), stage I 06/22/2016   Uterine fibroid 01/11/2016   Allergic rhinitis, seasonal 05/24/2015   Benign essential HTN 05/24/2015   Chronic constipation 05/24/2015   Diabetes mellitus with renal manifestation ([place]) 05/24/2015   Dyslipidemia 05/24/2015   Post-surgical hypothyroidism 05/24/2015   Mixed incontinence 05/24/2015   Microalbuminuria 05/24/2015   Menopause 05/24/2015  Obstructive apnea 05/24/2015   Morbid obesity ([place]) 05/24/2015   Hypertensive retinopathy 05/24/2015   Vitamin D deficiency 05/24/2015   Status post total replacement of right shoulder 03/11/2015    Past Surgical History:  Procedure Laterality Date   BREAST BIOPSY Left 90's   benign core needle    BREAST BIOPSY Right 02/07/2021   [place] Bx, Q-clip, path pending    BREAST LUMPECTOMY WITH SENTINEL LYMPH NODE BIOPSY Right 03/06/2021   Procedure: BREAST LUMPECTOMY WITH SENTINEL LYMPH NODE BX;   Surgeon: Robert Bellow, MD;  Location: [facility] ORS;  Service: General;  Laterality: Right;   COLONOSCOPY WITH PROPOFOL N/A 01/26/2019   Procedure: COLONOSCOPY WITH PROPOFOL;  Surgeon: Lin Landsman, MD;  Location: [facility] ENDOSCOPY;  Service: Gastroenterology;  Laterality: N/A;   FOOT SURGERY Bilateral    heel spurs   JOINT REPLACEMENT     TOTAL SHOULDER REPLACEMENT Right 01/25/2015   Dr. Roland Rack   TOTAL THYROIDECTOMY  08/01/2018    Family History  Problem Relation Age of Onset   Heart disease Mother    Heart disease Father    Kidney disease Sister    Hypertension Sister    Diabetes Sister    Hypertension Brother    CVA Brother    Breast cancer Maternal Aunt 21   Hypertension Sister    Kidney failure Sister    Ovarian cancer Neg Hx    Colon cancer Neg Hx     Social History   Tobacco Use   Smoking status: Never   Smokeless tobacco: Never  Substance Use Topics   Alcohol use: No    Alcohol/week: 0.0 standard drinks     Current Outpatient Medications:    aspirin 81 MG tablet, Take 81 mg by mouth daily., Disp: , Rfl:    Cholecalciferol (VITAMIN D3) 1000 units CAPS, Take 1,000 Units by mouth daily., Disp: , Rfl:    dapagliflozin propanediol (FARXIGA) 10 MG TABS tablet, TAKE 1 TABLET BY MOUTH ONCE DAILY BEFORE BREAKFAST IN PLACE OF XIGDUO., Disp: 90 tablet, Rfl: 3   diclofenac Sodium (VOLTAREN) 1 % GEL, Apply 2 g topically 4 (four) times daily as needed., Disp: 100 g, Rfl: 2   exemestane (AROMASIN) 25 MG tablet, Take 1 tablet (25 mg total) by mouth daily after breakfast., Disp: 30 tablet, Rfl: 2   ezetimibe (ZETIA) 10 MG tablet, Take 1 tablet (10 mg total) by mouth daily., Disp: 30 tablet, Rfl: 5   glucose blood test strip, FREESTYLE LITE TEST (In Vitro Strip)  use as directed for 0 days  Quantity: 200.00;  Refills: 3   Ordered :25-Nov-2014  Vonna Kotyk ;  Started 30-Dec-2008 Active Comments: DX: 250.00, Disp: , Rfl:    ketoconazole (NIZORAL) 2 % cream, Apply 1  application topically daily. (Patient taking differently: Apply 1 application topically daily as needed for irritation.), Disp: 60 g, Rfl: 0   levothyroxine (SYNTHROID) 125 MCG tablet, Take 1 tablet (125 mcg total) by mouth daily., Disp: 90 tablet, Rfl: 1   Olmesartan-amLODIPine-HCTZ 40-5-25 MG TABS, TAKE 1 TABLET DAILY (SCHEDULE APPOINTMENT FOR FURTHER REFILLS), Disp: 90 tablet, Rfl: 3   Semaglutide (RYBELSUS) 14 MG TABS, Take 14 mg by mouth daily., Disp: 90 tablet, Rfl: 1  Allergies  Allergen Reactions   Rosuvastatin Other (See Comments)    Myalgia     I personally reviewed active problem list, medication list, allergies, family history, social history, health maintenance with the patient/caregiver today.   ROS  Constitutional: Negative for fever, positive for mild  weight change.  Respiratory: Negative for cough and shortness of breath.   Cardiovascular: Negative for chest pain or palpitations.  Gastrointestinal: Negative for abdominal pain, no bowel changes.  Musculoskeletal: Positive  for gait problem and intermittent  joint swelling.  Skin: Negative for rash.  Neurological: Negative for dizziness or headache.  No other specific complaints in a complete review of systems (except as listed in HPI above).   Objective  Vitals:   08/30/21 0739  BP: 124/80  Pulse: 96  Resp: 16  Temp: 98.7 F (37.1 C)  SpO2: 98%  Weight: 220 lb (99.8 kg)  Height: 5\' 4"  (1.626 m)    Body mass index is 37.76 kg/m.  Physical Exam  Constitutional: Patient appears well-developed and well-nourished. Obese  No distress.  HEENT: head atraumatic, normocephalic, pupils equal and reactive to light,, neck supple Cardiovascular: Normal rate, regular rhythm and normal heart sounds.  2 plus systolic  murmur heard. No BLE edema. Pulmonary/Chest: Effort normal and breath sounds normal. No respiratory distress. Abdominal: Soft.  There is no tenderness. Psychiatric: Patient has a normal mood and affect.  behavior is normal. Judgment and thought content normal.   Recent Results (from the past 2160 hour(s))  CBC with Differential/Platelet     Status: Abnormal   Collection Time: 06/20/21  2:27 PM  Result Value Ref Range   WBC 3.5 (L) 3.8 - 10.8 Thousand/uL   RBC 4.99 3.80 - 5.10 Million/uL   Hemoglobin 13.5 11.7 - 15.5 g/dL   HCT 41.4 35.0 - 45.0 %   MCV 83.0 80.0 - 100.0 fL   MCH 27.1 27.0 - 33.0 pg   MCHC 32.6 32.0 - 36.0 g/dL   RDW 14.5 11.0 - 15.0 %   Platelets 339 140 - 400 Thousand/uL   MPV 9.3 7.5 - 12.5 fL   Neutro Abs 1,659 1,500 - 7,800 cells/uL   Lymphs Abs 1,320 850 - 3,900 cells/uL   Absolute Monocytes 301 200 - 950 cells/uL   Eosinophils Absolute 189 15 - 500 cells/uL   Basophils Absolute 32 0 - 200 cells/uL   Neutrophils Relative % 47.4 %   Total Lymphocyte 37.7 %   Monocytes Relative 8.6 %   Eosinophils Relative 5.4 %   Basophils Relative 0.9 %  Uric acid     Status: None   Collection Time: 06/20/21  2:27 PM  Result Value Ref Range   Uric Acid, Serum 6.6 2.5 - 7.0 mg/dL    Comment: Therapeutic target for gout patients: <6.0 mg/dL .   ANA     Status: Abnormal   Collection Time: 06/20/21  2:27 PM  Result Value Ref Range   Anti Nuclear Antibody (ANA) POSITIVE (A) NEGATIVE    Comment: ANA IFA is a first line screen for detecting the presence of up to approximately 150 autoantibodies in various autoimmune diseases. A positive ANA IFA result is suggestive of autoimmune disease and reflexes to titer and pattern. Further laboratory testing may be considered if clinically indicated. . For additional information, please refer to http://education.[facility].com/faq/FAQ177 (This link is being provided for informational/ educational purposes only.) .   C-reactive protein     Status: None   Collection Time: 06/20/21  2:27 PM  Result Value Ref Range   CRP 2.4 <8.0 mg/L  Rheumatoid factor     Status: None   Collection Time: 06/20/21  2:27 PM  Result Value  Ref Range   Rhuematoid fact SerPl-aCnc <14 <14 IU/mL  Sedimentation rate     Status: None  Collection Time: 06/20/21  2:27 PM  Result Value Ref Range   Sed Rate 11 0 - 30 mm/h  CK (Creatine Kinase)     Status: Abnormal   Collection Time: 06/20/21  2:27 PM  Result Value Ref Range   Total CK 233 (H) 29 - 143 U/L  Anti-nuclear ab-titer (ANA titer)     Status: Abnormal   Collection Time: 06/20/21  2:27 PM  Result Value Ref Range   ANA Titer 1 1:40 (H) titer    Comment: A low level ANA titer may be present in pre-clinical autoimmune diseases and normal individuals.                 Reference Range                 <1:40        Negative                 1:40-1:80    Low Antibody Level                 >1:80        Elevated Antibody Level .    ANA Pattern 1 Nuclear, Speckled (A)     Comment: Speckled pattern is associated with mixed connective tissue disease (MCTD), systemic lupus erythematosus (SLE), Sjogren's syndrome, dermatomyositis, and  systemic sclerosis/polymyositis overlap. . AC-2,4,5,29: Speckled . International Consensus on ANA Patterns ([email or web address]-[email or web address]/)   Comprehensive metabolic panel     Status: Abnormal   Collection Time: 07/04/21  9:42 AM  Result Value Ref Range   Sodium 137 135 - 145 mmol/L   Potassium 3.8 3.5 - 5.1 mmol/L   Chloride 103 98 - 111 mmol/L   CO2 26 22 - 32 mmol/L   Glucose, Bld 112 (H) 70 - 99 mg/dL    Comment: Glucose reference range applies only to samples taken after fasting for at least 8 hours.   BUN 13 8 - 23 mg/dL   Creatinine, Ser 1.30 (H) 0.44 - 1.00 mg/dL   Calcium 8.9 8.9 - 10.3 mg/dL   Total Protein 7.3 6.5 - 8.1 g/dL   Albumin 3.9 3.5 - 5.0 g/dL   AST 22 15 - 41 U/L   ALT 24 0 - 44 U/L   Alkaline Phosphatase 40 38 - 126 U/L   Total Bilirubin 0.7 0.3 - 1.2 mg/dL   GFR, Estimated 46 (L) >60 mL/min    Comment: (NOTE) Calculated using the CKD-EPI Creatinine Equation (2021)    Anion gap 8 5 - 15    Comment:  Performed at [facility], [address] [place], [place] [address]  CBC with Differential/Platelet     Status: None   Collection Time: 07/04/21  9:42 AM  Result Value Ref Range   WBC 4.3 4.0 - 10.5 K/uL   RBC 4.97 3.87 - 5.11 MIL/uL   Hemoglobin 13.5 12.0 - 15.0 g/dL   HCT 41.3 36.0 - 46.0 %   MCV 83.1 80.0 - 100.0 fL   MCH 27.2 26.0 - 34.0 pg   MCHC 32.7 30.0 - 36.0 g/dL   RDW 14.9 11.5 - 15.5 %   Platelets 312 150 - 400 K/uL   nRBC 0.0 0.0 - 0.2 %   Neutrophils Relative % 57 %   Neutro Abs 2.5 1.7 - 7.7 K/uL   Lymphocytes Relative 29 %   Lymphs Abs 1.2 0.7 - 4.0 K/uL   Monocytes Relative 9 %   Monocytes Absolute 0.4 0.1 - 1.0  K/uL   Eosinophils Relative 3 %   Eosinophils Absolute 0.1 0.0 - 0.5 K/uL   Basophils Relative 1 %   Basophils Absolute 0.0 0.0 - 0.1 K/uL   Immature Granulocytes 1 %   Abs Immature Granulocytes 0.02 0.00 - 0.07 K/uL    Comment: Performed at [facility], [address] [place], [place] [address]  POCT HgB A1C     Status: Abnormal   Collection Time: 08/30/21  7:51 AM  Result Value Ref Range   Hemoglobin A1C 6.1 (A) 4.0 - 5.6 %   HbA1c POC (<> result, manual entry)     HbA1c, POC (prediabetic range)     HbA1c, POC (controlled diabetic range)      Diabetic Foot Exam: Diabetic Foot Exam - Simple   Simple Foot Form Diabetic Foot exam was performed with the following findings: Yes 08/30/2021  8:15 AM  Visual Inspection No deformities, no ulcerations, no other skin breakdown bilaterally: Yes Sensation Testing Intact to touch and monofilament testing bilaterally: Yes Pulse Check Posterior Tibialis and Dorsalis pulse intact bilaterally: Yes Comments      PHQ2/9: Depression screen [facility] 2/9 08/30/2021 06/20/2021 05/29/2021 01/26/2021 10/20/2020  Decreased Interest 1 0 0 0 0  Down, Depressed, Hopeless 0 0 0 0 0  PHQ - 2 Score 1 0 0 0 0  Altered sleeping 1 0 - 0 1  Tired, decreased energy 0 0 - 0 1  Change in appetite 0 0 - 0 0   Feeling bad or failure about yourself  0 0 - 0 0  Trouble concentrating 0 0 - 0 0  Moving slowly or fidgety/restless 0 0 - 0 0  Suicidal thoughts 0 0 - 0 0  PHQ-9 Score 2 0 - 0 2  Difficult doing work/chores - Not difficult at all - - Not difficult at all  Some recent data might be hidden    phq 9 is negative   Fall Risk: Fall Risk  08/30/2021 06/20/2021 05/29/2021 01/26/2021 10/20/2020  Falls in the past year? 1 0 1 0 0  Number falls in past yr: 0 0 0 0 0  Injury with Fall? 0 0 0 0 0  Risk for fall due to : No Fall Risks - - - -  Follow up Falls prevention discussed - - - -      Functional Status Survey: Is the patient deaf or have difficulty hearing?: No Does the patient have difficulty seeing, even when wearing glasses/contacts?: No Does the patient have difficulty concentrating, remembering, or making decisions?: No Does the patient have difficulty walking or climbing stairs?: No Does the patient have difficulty dressing or bathing?: No Does the patient have difficulty doing errands alone such as visiting a doctor's office or shopping?: No    Assessment & Plan   1. Diabetes mellitus type 2 in obese (HCC)  - POCT HgB A1C - HM Diabetes Foot Exam - Semaglutide (RYBELSUS) 14 MG TABS; Take 14 mg by mouth daily.  Dispense: 90 tablet; Refill: 1  2. Elevated antinuclear antibody (ANA) level  - Ambulatory referral to Rheumatology  3. Elevated CK  - Ambulatory referral to Rheumatology  4. Benign essential HTN   5. Dyslipidemia due to type 2 diabetes mellitus (HCC)  - Semaglutide (RYBELSUS) 14 MG TABS; Take 14 mg by mouth daily.  Dispense: 90 tablet; Refill: 1  6. Malignant neoplasm of upper-outer quadrant of right female breast, unspecified estrogen receptor status ([place])   7. Morbid obesity ([place])  Discussed  with the patient the risk posed by an increased BMI. Discussed importance of portion control, calorie counting and at least 150 minutes of physical activity  weekly. Avoid sweet beverages and drink more water. Eat at least 6 servings of fruit and vegetables daily    8. Vitamin D deficiency   9. Post-surgical hypothyroidism   10. Obstructive apnea   11. Hypertension associated with diabetes ([place])   12. CKD (chronic kidney disease), stage III ( [facility])   On ARB  13. Primary osteoarthritis of both knees   14. Polyarthralgia  - Ambulatory referral to Rheumatology  15. Controlled type 2 diabetes mellitus with stage 3 chronic kidney disease, without long-term current use of insulin ([place])

## 2021-09-05 ENCOUNTER — Telehealth: Payer: Self-pay

## 2021-09-05 ENCOUNTER — Telehealth (INDEPENDENT_AMBULATORY_CARE_PROVIDER_SITE_OTHER): Payer: BC Managed Care – PPO | Admitting: Nurse Practitioner

## 2021-09-05 ENCOUNTER — Encounter: Payer: Self-pay | Admitting: Nurse Practitioner

## 2021-09-05 ENCOUNTER — Other Ambulatory Visit: Payer: Self-pay

## 2021-09-05 DIAGNOSIS — H1033 Unspecified acute conjunctivitis, bilateral: Secondary | ICD-10-CM | POA: Diagnosis not present

## 2021-09-05 MED ORDER — ERYTHROMYCIN 5 MG/GM OP OINT: 1.0000 "application " | TOPICAL_OINTMENT | Freq: Four times a day (QID) | OPHTHALMIC | 0 refills | Status: AC

## 2021-09-05 NOTE — Telephone Encounter (Signed)
Copied from [place] [id number]. Topic: Appointment Scheduling - Scheduling Inquiry for Clinic >> Sep 05, 2021  7:19 AM Scherrie Gerlach wrote: Reason for CRM: pt called 7:15 am and needed appt for poss pink eye.  Scheduled w/ Juli at 4 pm.  OK? Thx.

## 2021-09-05 NOTE — Progress Notes (Signed)
Name: Andrea Randolph   MRN: [id number]    DOB: 12/16/1955   Date:09/05/2021       Progress Note  Subjective  Chief Complaint  Chief Complaint  Patient presents with   Conjunctivitis    Bilateral for 3 days    I connected with  Andrea Randolph  on 09/05/21 at  4:00 PM EDT by a video enabled telemedicine application and verified that I am speaking with the correct person using two identifiers.  I discussed the limitations of evaluation and management by telemedicine and the availability of in person appointments. The patient expressed understanding and agreed to proceed with a virtual visit  Staff also discussed with the patient that there may be a patient responsible charge related to this service. Patient Location: home Provider Location: [facility] Additional Individuals present: alone  HPI  Conjunctivitis:  She said it started in her left eye and then a day later it was in her right eye.  She said she woke up with her left eye matted shut on Sunday.  She says that she has been using a warm wash cloth to loosen up the matting.  She says that now both her eyes are red, itching and draining green drainage.  She denise any fever, changes in vision or nasal congestion.  Discussed hand hygiene and treatment.    Patient Active Problem List   Diagnosis Date Noted   Aromatase inhibitor use 05/30/2021   Complaints of total body pain 05/30/2021   Malignant 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, estrogen receptor positive ([place]) 02/20/2021   Thrombocytosis 11/01/2019   Carpal tunnel syndrome, bilateral 02/19/2019   Trigger finger of right thumb 02/19/2019   Flexor tenosynovitis of finger 02/19/2019   Severe obesity (BMI 35.0-35.9 with comorbidity) ([place]) 12/29/2018   Mild concentric left ventricular hypertrophy (LVH) 12/24/2017   Mild aortic stenosis by prior echocardiogram 12/24/2017   Mild aortic regurgitation 12/24/2017   CKD (chronic kidney disease), stage I 06/22/2016    Uterine fibroid 01/11/2016   Allergic rhinitis, seasonal 05/24/2015   Benign essential HTN 05/24/2015   Chronic constipation 05/24/2015   Diabetes mellitus with renal manifestation ([place]) 05/24/2015   Dyslipidemia 05/24/2015   Post-surgical hypothyroidism 05/24/2015   Mixed incontinence 05/24/2015   Microalbuminuria 05/24/2015   Menopause 05/24/2015   Obstructive apnea 05/24/2015   Morbid obesity ([place]) 05/24/2015   Hypertensive retinopathy 05/24/2015   Vitamin D deficiency 05/24/2015   Status post total replacement of right shoulder 03/11/2015    Social History   Tobacco Use   Smoking status: Never   Smokeless tobacco: Never  Substance Use Topics   Alcohol use: No    Alcohol/week: 0.0 standard drinks     Current Outpatient Medications:    aspirin 81 MG tablet, Take 81 mg by mouth daily., Disp: , Rfl:    Cholecalciferol (VITAMIN D3) 1000 units CAPS, Take 1,000 Units by mouth daily., Disp: , Rfl:    dapagliflozin propanediol (FARXIGA) 10 MG TABS tablet, TAKE 1 TABLET BY MOUTH ONCE DAILY BEFORE BREAKFAST IN PLACE OF XIGDUO., Disp: 90 tablet, Rfl: 3   diclofenac Sodium (VOLTAREN) 1 % GEL, Apply 2 g topically 4 (four) times daily as needed., Disp: 100 g, Rfl: 2   exemestane (AROMASIN) 25 MG tablet, Take 1 tablet (25 mg total) by mouth daily after breakfast., Disp: 30 tablet, Rfl: 2   ezetimibe (ZETIA) 10 MG tablet, Take 1 tablet (10 mg total) by mouth daily., Disp: 30 tablet, Rfl: 5  glucose blood test strip, FREESTYLE LITE TEST (In Vitro Strip)  use as directed for 0 days  Quantity: 200.00;  Refills: 3   Ordered :25-Nov-2014  Vonna Kotyk ;  Started 30-Dec-2008 Active Comments: DX: 250.00, Disp: , Rfl:    ketoconazole (NIZORAL) 2 % cream, Apply 1 application topically daily. (Patient taking differently: Apply 1 application topically daily as needed for irritation.), Disp: 60 g, Rfl: 0   levothyroxine (SYNTHROID) 125 MCG tablet, Take 1 tablet (125 mcg total) by mouth daily., Disp:  90 tablet, Rfl: 1   Olmesartan-amLODIPine-HCTZ 40-5-25 MG TABS, TAKE 1 TABLET DAILY (SCHEDULE APPOINTMENT FOR FURTHER REFILLS), Disp: 90 tablet, Rfl: 3   Semaglutide (RYBELSUS) 14 MG TABS, Take 14 mg by mouth daily., Disp: 90 tablet, Rfl: 1  Allergies  Allergen Reactions   Rosuvastatin Other (See Comments)    Myalgia     I personally reviewed active problem list, medication list, allergies with the patient/caregiver today.  ROS  Constitutional: Negative for fever or weight change.  HEENT: positive for eye drainage, matting, redness and itching Respiratory: Negative for cough and shortness of breath.   Cardiovascular: Negative for chest pain or palpitations.  Gastrointestinal: Negative for abdominal pain, no bowel changes.  Musculoskeletal: Negative for gait problem or joint swelling.  Skin: Negative for rash.  Neurological: Negative for dizziness or headache.  No other specific complaints in a complete review of systems (except as listed in HPI above).   Objective  Virtual encounter, vitals not obtained.  There is no height or weight on file to calculate BMI.  Nursing Note and Vital Signs reviewed.  Physical Exam  Awake, alert and oriented  Assessment & Plan  1. Acute bacterial conjunctivitis of both eyes -warm compresses to loosen up matted eye lids -good hand hygiene to prevent spread - erythromycin ophthalmic ointment; Place 1 application into both eyes 4 (four) times daily for 7 days. Place 1/2 inch ribbon of ointment in the affected eye 4 times a day  Dispense: 28 g; Refill: 0   -Red flags and when to present for emergency care or RTC including fever >101.3F, chest pain, shortness of breath, new/worsening/un-resolving symptoms,  reviewed with patient at time of visit. Follow up and care instructions discussed and provided in AVS. - I discussed the assessment and treatment plan with the patient. The patient was provided an opportunity to ask questions and all were  answered. The patient agreed with the plan and demonstrated an understanding of the instructions.  I provided 15 minutes of non-face-to-face time during this encounter.  Bo Merino, FNP

## 2021-09-19 DIAGNOSIS — R748 Abnormal levels of other serum enzymes: Secondary | ICD-10-CM | POA: Diagnosis not present

## 2021-09-19 DIAGNOSIS — M2550 Pain in unspecified joint: Secondary | ICD-10-CM | POA: Diagnosis not present

## 2021-09-19 DIAGNOSIS — G8929 Other chronic pain: Secondary | ICD-10-CM | POA: Diagnosis not present

## 2021-09-19 DIAGNOSIS — M545 Low back pain, unspecified: Secondary | ICD-10-CM | POA: Diagnosis not present

## 2021-09-19 DIAGNOSIS — R768 Other specified abnormal immunological findings in serum: Secondary | ICD-10-CM | POA: Diagnosis not present

## 2021-10-12 ENCOUNTER — Other Ambulatory Visit: Payer: Self-pay | Admitting: Oncology

## 2021-11-03 ENCOUNTER — Other Ambulatory Visit: Payer: Self-pay | Admitting: *Deleted

## 2021-11-03 ENCOUNTER — Encounter: Payer: Self-pay | Admitting: *Deleted

## 2021-11-08 ENCOUNTER — Inpatient Hospital Stay: Payer: BC Managed Care – PPO

## 2021-11-08 ENCOUNTER — Inpatient Hospital Stay: Payer: BC Managed Care – PPO | Attending: Oncology | Admitting: Oncology

## 2021-11-08 ENCOUNTER — Other Ambulatory Visit: Payer: Self-pay

## 2021-11-08 ENCOUNTER — Encounter: Payer: Self-pay | Admitting: Oncology

## 2021-11-08 VITALS — BP 144/86 | HR 75 | Temp 96.2°F | Resp 16 | Ht 64.0 in | Wt 223.6 lb

## 2021-11-08 DIAGNOSIS — Z7984 Long term (current) use of oral hypoglycemic drugs: Secondary | ICD-10-CM | POA: Insufficient documentation

## 2021-11-08 DIAGNOSIS — Z170 Estrogen receptor positive status [ER+]: Secondary | ICD-10-CM | POA: Insufficient documentation

## 2021-11-08 DIAGNOSIS — Z79811 Long term (current) use of aromatase inhibitors: Secondary | ICD-10-CM | POA: Insufficient documentation

## 2021-11-08 DIAGNOSIS — D631 Anemia in chronic kidney disease: Secondary | ICD-10-CM | POA: Insufficient documentation

## 2021-11-08 DIAGNOSIS — Z79899 Other long term (current) drug therapy: Secondary | ICD-10-CM | POA: Diagnosis not present

## 2021-11-08 DIAGNOSIS — C50211 Malignant neoplasm of upper-inner quadrant of right female breast: Secondary | ICD-10-CM | POA: Insufficient documentation

## 2021-11-08 DIAGNOSIS — N1831 Chronic kidney disease, stage 3a: Secondary | ICD-10-CM | POA: Diagnosis not present

## 2021-11-08 DIAGNOSIS — Z7982 Long term (current) use of aspirin: Secondary | ICD-10-CM | POA: Diagnosis not present

## 2021-11-08 LAB — CBC WITH DIFFERENTIAL/PLATELET
Abs Immature Granulocytes: 0.01 10*3/uL (ref 0.00–0.07)
Basophils Absolute: 0 10*3/uL (ref 0.0–0.1)
Basophils Relative: 1 %
Eosinophils Absolute: 0.1 10*3/uL (ref 0.0–0.5)
Eosinophils Relative: 2 %
HCT: 40.8 % (ref 36.0–46.0)
Hemoglobin: 13.4 g/dL (ref 12.0–15.0)
Immature Granulocytes: 0 %
Lymphocytes Relative: 43 %
Lymphs Abs: 1.4 10*3/uL (ref 0.7–4.0)
MCH: 27.5 pg (ref 26.0–34.0)
MCHC: 32.8 g/dL (ref 30.0–36.0)
MCV: 83.8 fL (ref 80.0–100.0)
Monocytes Absolute: 0.3 10*3/uL (ref 0.1–1.0)
Monocytes Relative: 9 %
Neutro Abs: 1.5 10*3/uL — ABNORMAL LOW (ref 1.7–7.7)
Neutrophils Relative %: 45 %
Platelets: 307 10*3/uL (ref 150–400)
RBC: 4.87 MIL/uL (ref 3.87–5.11)
RDW: 13.9 % (ref 11.5–15.5)
WBC: 3.3 10*3/uL — ABNORMAL LOW (ref 4.0–10.5)
nRBC: 0 % (ref 0.0–0.2)

## 2021-11-08 LAB — COMPREHENSIVE METABOLIC PANEL
ALT: 19 U/L (ref 0–44)
AST: 17 U/L (ref 15–41)
Albumin: 3.9 g/dL (ref 3.5–5.0)
Alkaline Phosphatase: 37 U/L — ABNORMAL LOW (ref 38–126)
Anion gap: 9 (ref 5–15)
BUN: 18 mg/dL (ref 8–23)
CO2: 28 mmol/L (ref 22–32)
Calcium: 9.2 mg/dL (ref 8.9–10.3)
Chloride: 101 mmol/L (ref 98–111)
Creatinine, Ser: 1.27 mg/dL — ABNORMAL HIGH (ref 0.44–1.00)
GFR, Estimated: 47 mL/min — ABNORMAL LOW (ref >60)
Glucose, Bld: 97 mg/dL (ref 70–99)
Potassium: 3.7 mmol/L (ref 3.5–5.1)
Sodium: 138 mmol/L (ref 135–145)
Total Bilirubin: 0.2 mg/dL — ABNORMAL LOW (ref 0.3–1.2)
Total Protein: 7.2 g/dL (ref 6.5–8.1)

## 2021-11-08 MED ORDER — EXEMESTANE 25 MG PO TABS: 25.0000 mg | ORAL_TABLET | Freq: Every day | ORAL | 4 refills | Status: DC

## 2021-11-08 NOTE — Progress Notes (Signed)
Hematology/Oncology follow up  note [phone] [phone]   Patient Care Team: Pcp, No as PCP - General Rockey Situ, Kathlene November, MD as PCP - Cardiology (Cardiology) Rico Junker, RN as Oncology Nurse Navigator Earlie Server, MD as Consulting Physician (Oncology) Bary Castilla, Forest Gleason, MD as Consulting Physician (General Surgery) Steele Sizer, MD as Attending Physician (Family Medicine) Quintin Alto, MD as Consulting Physician (Rheumatology) Noreene Filbert, MD as Consulting Physician (Radiation Oncology) Urbano Heir as Physician Assistant (Orthopedic Surgery)  REFERRING PROVIDER: Steele Sizer, MD  CHIEF COMPLAINTS/REASON FOR VISIT:  Follow up of breast cancer.   HISTORY OF PRESENTING ILLNESS:   Andrea Randolph is a  65 y.o.  female with PMH listed below was seen in consultation at the request of  Steele Sizer, MD  for evaluation of breast cancer.   01/12/21 screening mammogram showed focal asymmetry with possible distortion.  01/26/21 right diagnostic mammogram and US showed 3x5x67m hypoechoic mass, s/p biopsy on 02/07/21 Pathology showed invasive mammary carcinoma, grade 2, ER90% PR 90%, HER2 negative.   Patient was seen by Dr.Byrnett yesterday. She presents to establish care with oncology Accompanied by her daughter  Menarche 89years with Age at first childbirth 89  OCP use for about 20 years Denies any estrogen replacement. LMP in 573s Denies any previous chest radiation. History of left breast biopsy previously. Family history Father for maternal aunt who was diagnosed of breast cancer at age of 582  03/06/2021 lumpectomy and sentinel lymph node biopsy. Pathology showed invasive mammary carcinoma, no special type, DCIS in situ, intermediate grade.  All 3 sentinel lymph nodes were negative.  1 nonsentinel lymph node was negative.  Margins for invasive carcinoma was negative.  Margins for DCIS was unifocal positive. Patient declined  reexcision.Patient's case was discussed on breast cancer tumor board on 03/20/2021.  # 05/03/2021 finished adjuvant radiation.  05/09/2021, patient started on Arimidex.  Patient called to report that she has experienced significant leg pain, back pain, neck pain shortly after starting Arimidex.  left knee pain and swelling.  06/20/2021, left knee x-ray showed diffuse joint space narrowing of the left knee with spurring at the lateral compartment.  Small joint effusion.  INTERVAL HISTORY Andrea FRYMANis a 65y.o. female who has above history reviewed by me today presents for follow up visit for management of breast cancer 07/20/2021 patient was seen by orthopedic surgeon for left knee arthritis.  Patient received intra-articular corticosteroid injection and left knee pain has improved. patient establish care with rheumatology and was started on gabapentin for polyarthralgia.  Her low titer of ANA was felt to be likely false positive.  Patient has started on Aromasin and tolerated well. Review of Systems  Constitutional:  Negative for appetite change, chills, fatigue and fever.  HENT:   Negative for hearing loss and voice change.   Eyes:  Negative for eye problems.  Respiratory:  Negative for chest tightness and cough.   Cardiovascular:  Negative for chest pain.  Gastrointestinal:  Negative for abdominal distention, abdominal pain and blood in stool.  Endocrine: Negative for hot flashes.  Genitourinary:  Negative for difficulty urinating and frequency.   Musculoskeletal:  Positive for arthralgias.  Skin:  Negative for itching and rash.  Neurological:  Negative for extremity weakness.  Hematological:  Negative for adenopathy.  Psychiatric/Behavioral:  Negative for confusion. The patient is not nervous/anxious.    MEDICAL HISTORY:  Past Medical History:  Diagnosis Date   Allergy    Anemia  Arthritis of right shoulder region    Dr. Tamala Julian   Chronic insomnia    Diabetes mellitus without  complication ([place])    Family history of adverse reaction to anesthesia    sister with Nausea   GERD (gastroesophageal reflux disease)    Goiter    Dr. Gabriel Carina   Heart murmur    Hyperlipidemia    Hypertension    Hypertensive retinopathy of left eye    Hypothyroidism, adult    Dr. Gabriel Carina   Inflammation of urethra    Menopause    Microalbuminuria    DM   Mixed incontinence    Muscle cramps    Obesity    OSA (obstructive sleep apnea)    Radiculitis, lumbosacral    Vitamin D deficiency     SURGICAL HISTORY: Past Surgical History:  Procedure Laterality Date   BREAST BIOPSY Left 90's   benign core needle    BREAST BIOPSY Right 02/07/2021   [place] Bx, Q-clip, path pending    BREAST LUMPECTOMY WITH SENTINEL LYMPH NODE BIOPSY Right 03/06/2021   Procedure: BREAST LUMPECTOMY WITH SENTINEL LYMPH NODE BX;  Surgeon: Robert Bellow, MD;  Location: [facility] ORS;  Service: General;  Laterality: Right;   COLONOSCOPY WITH PROPOFOL N/A 01/26/2019   Procedure: COLONOSCOPY WITH PROPOFOL;  Surgeon: Lin Landsman, MD;  Location: [facility];  Service: Gastroenterology;  Laterality: N/A;   FOOT SURGERY Bilateral    heel spurs   JOINT REPLACEMENT     TOTAL SHOULDER REPLACEMENT Right 01/25/2015   Dr. Roland Rack   TOTAL THYROIDECTOMY  08/01/2018    SOCIAL HISTORY: Social History   Socioeconomic History   Marital status: Legally Separated    Spouse name: Not on file   Number of children: 1   Years of education: Not on file   Highest education level: 12th grade  Occupational History   Occupation: Cytogeneticist  Tobacco Use   Smoking status: Never   Smokeless tobacco: Never  Vaping Use   Vaping Use: Never used  Substance and Sexual Activity   Alcohol use: No    Alcohol/week: 0.0 standard drinks   Drug use: No   Sexual activity: Not Currently    Birth control/protection: Post-menopausal  Other Topics Concern   Not on file  Social History Narrative   Separated in around 2009.    She was living with her daughter, however currently moved in with her sister and nephew , but is looking for a house.    Social Determinants of Health   Financial Resource Strain: Low Risk    Difficulty of Paying Living Expenses: Not hard at all  Food Insecurity: No Food Insecurity   Worried About Charity fundraiser in the Last Year: Never true   [place] in the Last Year: Never true  Transportation Needs: No Transportation Needs   Lack of Transportation (Medical): No   Lack of Transportation (Non-Medical): No  Physical Activity: Sufficiently Active   Days of Exercise per Week: 3 days   Minutes of Exercise per Session: 60 min  Stress: No Stress Concern Present   Feeling of Stress : Only a little  Social Connections: Moderately Integrated   Frequency of Communication with Friends and Family: More than three times a week   Frequency of Social Gatherings with Friends and Family: More than three times a week   Attends Religious Services: More than 4 times per year   Active Member of [employer] or Organizations: Yes   Attends [employer]  or Organization Meetings: More than 4 times per year   Marital Status: Separated  Intimate Partner Violence: Not At Risk   Fear of Current or Ex-Partner: No   Emotionally Abused: No   Physically Abused: No   Sexually Abused: No    FAMILY HISTORY: Family History  Problem Relation Age of Onset   Heart disease Mother    Heart disease Father    Kidney disease Sister    Hypertension Sister    Diabetes Sister    Hypertension Brother    CVA Brother    Breast cancer Maternal Aunt 31   Hypertension Sister    Kidney failure Sister    Ovarian cancer Neg Hx    Colon cancer Neg Hx     ALLERGIES:  is allergic to rosuvastatin.  MEDICATIONS:  Current Outpatient Medications  Medication Sig Dispense Refill   aspirin 81 MG tablet Take 81 mg by mouth daily.     Calcium Carb-Cholecalciferol (CALCIUM 500+D3 PO) Take 2 tablets by mouth daily.      Cholecalciferol (VITAMIN D3) 1000 units CAPS Take 1,000 Units by mouth daily.     dapagliflozin propanediol (FARXIGA) 10 MG TABS tablet TAKE 1 TABLET BY MOUTH ONCE DAILY BEFORE BREAKFAST IN PLACE OF XIGDUO. 90 tablet 3   diclofenac Sodium (VOLTAREN) 1 % GEL Apply 2 g topically 4 (four) times daily as needed. 100 g 2   ezetimibe (ZETIA) 10 MG tablet Take 1 tablet (10 mg total) by mouth daily. 30 tablet 5   gabapentin (NEURONTIN) 100 MG capsule Take 1 capsule by mouth 3 (three) times daily.     glucose blood test strip FREESTYLE LITE TEST (In Vitro Strip)  use as directed for 0 days  Quantity: 200.00;  Refills: 3   Ordered :25-Nov-2014  Vonna Kotyk ;  Started 30-Dec-2008 Active Comments: DX: 250.00     levothyroxine (SYNTHROID) 125 MCG tablet Take 1 tablet (125 mcg total) by mouth daily. 90 tablet 1   Olmesartan-amLODIPine-HCTZ 40-5-25 MG TABS TAKE 1 TABLET DAILY (SCHEDULE APPOINTMENT FOR FURTHER REFILLS) 90 tablet 3   Semaglutide (RYBELSUS) 14 MG TABS Take 14 mg by mouth daily. 90 tablet 1   exemestane (AROMASIN) 25 MG tablet Take 1 tablet (25 mg total) by mouth daily. 30 tablet 4   ketoconazole (NIZORAL) 2 % cream Apply 1 application topically daily. (Patient not taking: Reported on 11/08/2021) 60 g 0   No current facility-administered medications for this visit.     PHYSICAL EXAMINATION: ECOG PERFORMANCE STATUS: 0 - Asymptomatic Vitals:   11/08/21 0959  BP: (!) 144/86  Pulse: 75  Resp: 16  Temp: (!) 96.2 F (35.7 C)  SpO2: 96%   Filed Weights   11/08/21 0959  Weight: 223 lb 9.6 oz (101.4 kg)    Physical Exam Constitutional:      General: She is not in acute distress. HENT:     Head: Normocephalic and atraumatic.  Eyes:     General: No scleral icterus. Cardiovascular:     Rate and Rhythm: Normal rate and regular rhythm.     Heart sounds: Murmur heard.  Pulmonary:     Effort: Pulmonary effort is normal. No respiratory distress.     Breath sounds: No wheezing.   Abdominal:     General: Bowel sounds are normal. There is no distension.     Palpations: Abdomen is soft.  Musculoskeletal:        General: No deformity. Normal range of motion.     Cervical back: Normal  range of motion and neck supple.  Skin:    General: Skin is warm and dry.     Findings: No erythema or rash.  Neurological:     Mental Status: She is alert and oriented to person, place, and time. Mental status is at baseline.     Cranial Nerves: No cranial nerve deficit.     Coordination: Coordination normal.  Psychiatric:        Mood and Affect: Mood normal.    LABORATORY DATA:  I have reviewed the data as listed Lab Results  Component Value Date   WBC 3.3 (L) 11/08/2021   HGB 13.4 11/08/2021   HCT 40.8 11/08/2021   MCV 83.8 11/08/2021   PLT 307 11/08/2021   Recent Labs    01/26/21 0949 05/09/21 1351 05/29/21 0923 07/04/21 0942 11/08/21 1049  NA 141 137 139 137 138  K 4.1 3.4* 4.1 3.8 3.7  CL 103 99 102 103 101  CO2 _0 GLUCOSE 92 128* 100* 112* 97  BUN _1 CREATININE 1.15* 1.12* 1.11* 1.30* 1.27*  CALCIUM 9.7 8.8* 9.5 8.9 9.2  GFRNONAA 51* 55*  --  46* 47*  GFRAA 59*  --   --   --   --   PROT 7.0 7.0 7.2 7.3 7.2  ALBUMIN  --  3.9  --  3.9 3.9  AST _2 ALT _3 ALKPHOS  --  43  --  40 37*  BILITOT 0.5 0.5 0.3 0.7 0.2*    Iron/TIBC/Ferritin/ %Sat    Component Value Date/Time   FERRITIN 27 08/29/2015 1135      RADIOGRAPHIC STUDIES: I have personally reviewed the radiological images as listed and agreed with the findings in the report. No results found.     ASSESSMENT & PLAN:  1. Malignant 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, estrogen receptor positive ([place])   2. Aromatase inhibitor use   3. Anemia in stage 3a chronic kidney disease ([place])    Cancer Staging  Malignant 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, estrogen receptor positive ([place]) Staging form:  Breast, AJCC 8th Edition - Pathologic stage from 03/20/2021: Stage IA (pT1a, pN0, cM0, G2, ER+, PR+, HER2-) - Signed by Earlie Server, MD on 03/20/2021   Right breast invasive carcinoma.  pT1a pN0, ER positive, PR positive, HER2 negative. Focal DCIS margin positive.Patient declined reexcision.s/p adjuvant radiation.  Unable to tolerate Arimidex  switched to Aromasin. Labs reviewed discussed with patient. Continue current regimen. Recommend calcium 1200 mg daily.  She has a normal baseline bone density. Obtain bilateral diagnostic mammogram in March 2023  Chronic kidney disease, stage III.  Creatinine is 1.27.  Avoid nephrotoxin.  Encourage hydration.  All questions were answered. The patient knows to call the clinic with any problems questions or concerns.  cc Steele Sizer, MD    Return of visit: Follow-up in 4 months.  11/08/2021

## 2021-11-10 ENCOUNTER — Ambulatory Visit
Admission: RE | Admit: 2021-11-10 | Discharge: 2021-11-10 | Disposition: A | Discharge status: Home / Self Care | Payer: BC Managed Care – PPO | Source: Ambulatory Visit | Attending: Radiation Oncology | Admitting: Radiation Oncology

## 2021-11-10 ENCOUNTER — Encounter: Payer: Self-pay | Admitting: Radiation Oncology

## 2021-11-10 ENCOUNTER — Other Ambulatory Visit: Payer: Self-pay

## 2021-11-10 VITALS — BP 134/91 | HR 75 | Temp 97.2°F | Wt 223.0 lb

## 2021-11-10 DIAGNOSIS — Z170 Estrogen receptor positive status [ER+]: Secondary | ICD-10-CM | POA: Diagnosis not present

## 2021-11-10 DIAGNOSIS — Z923 Personal history of irradiation: Secondary | ICD-10-CM | POA: Diagnosis not present

## 2021-11-10 DIAGNOSIS — M545 Low back pain, unspecified: Secondary | ICD-10-CM | POA: Diagnosis not present

## 2021-11-10 DIAGNOSIS — C50211 Malignant neoplasm of upper-inner quadrant of right female breast: Secondary | ICD-10-CM | POA: Insufficient documentation

## 2021-11-10 DIAGNOSIS — Z79811 Long term (current) use of aromatase inhibitors: Secondary | ICD-10-CM | POA: Insufficient documentation

## 2021-11-10 DIAGNOSIS — Z08 Encounter for follow-up examination after completed treatment for malignant neoplasm: Secondary | ICD-10-CM | POA: Diagnosis not present

## 2021-11-10 NOTE — Progress Notes (Signed)
Radiation Oncology Follow up Note  Name: Andrea Randolph   Date:   11/10/2021 MRN:  [id number] DOB: 1956-09-12    This 65 y.o. female presents to the clinic today for 8-month follow-up status post whole breast radiation to her right breast for stage Ia ER/PR positive invasive mammary carcinoma.  REFERRING PROVIDER: Steele Sizer, MD  HPI: Patient is a 65 year old female now out 6 months having completed whole breast radiation to her right breast for stage Ia ER/PR positive invasive mammary carcinoma seen today in routine follow-up her major complaint is lower back pain and bilateral hip pain which seems to be associated when she started taking her.  Aromasin.  She specifically denies breast tenderness cough or bone pain.  She has mammogram scheduled for March.  COMPLICATIONS OF TREATMENT: none  FOLLOW UP COMPLIANCE: keeps appointments   PHYSICAL EXAM:  BP (!) 134/91    Pulse 75    Temp (!) 97.2 F (36.2 C) (Tympanic)    Wt 223 lb (101.2 kg)    BMI 38.28 kg/m  Lungs are clear to A&P cardiac examination essentially unremarkable with regular rate and rhythm. No dominant mass or nodularity is noted in either breast in 2 positions examined. Incision is well-healed. No axillary or supraclavicular adenopathy is appreciated. Cosmetic result is excellent.  Well-developed well-nourished patient in NAD. HEENT reveals PERLA, EOMI, discs not visualized.  Oral cavity is clear. No oral mucosal lesions are identified. Neck is clear without evidence of cervical or supraclavicular adenopathy. Lungs are clear to A&P. Cardiac examination is essentially unremarkable with regular rate and rhythm without murmur rub or thrill. Abdomen is benign with no organomegaly or masses noted. Motor sensory and DTR levels are equal and symmetric in the upper and lower extremities. Cranial nerves II through XII are grossly intact. Proprioception is intact. No peripheral adenopathy or edema is identified. No motor or sensory levels  are noted. Crude visual fields are within normal range.  RADIOLOGY RESULTS: No current films for review  PLAN: This time of asked the patient to discontinue Aromasin for several weeks and see if there is any change in her pain.  Mitral believe this is probably a disc problem in her lower back and an MRI scan of her lumbar spine may be indicated.  I have asked to see her back in 6 months I have explained to her my thoughts.  Patient continues close follow-up care with medical oncology.  I would like to take this opportunity to thank you for allowing me to participate in the care of your patient.Noreene Filbert, MD

## 2021-11-10 NOTE — Progress Notes (Signed)
Survivorship Care Plan visit completed.  Treatment summary reviewed and given to patient.  ASCO answers booklet reviewed and given to patient.  CARE program and Cancer Transitions discussed with patient along with other resources cancer center offers to patients and caregivers.  Patient verbalized understanding.    

## 2021-11-20 DIAGNOSIS — R768 Other specified abnormal immunological findings in serum: Secondary | ICD-10-CM | POA: Diagnosis not present

## 2021-11-20 DIAGNOSIS — M5136 Other intervertebral disc degeneration, lumbar region: Secondary | ICD-10-CM | POA: Diagnosis not present

## 2021-11-21 ENCOUNTER — Other Ambulatory Visit: Payer: Self-pay | Admitting: Family Medicine

## 2021-11-21 DIAGNOSIS — E890 Postprocedural hypothyroidism: Secondary | ICD-10-CM

## 2021-11-27 DIAGNOSIS — M5136 Other intervertebral disc degeneration, lumbar region: Secondary | ICD-10-CM | POA: Diagnosis not present

## 2021-12-05 ENCOUNTER — Ambulatory Visit: Payer: BC Managed Care – PPO | Admitting: Family Medicine

## 2021-12-05 DIAGNOSIS — M5136 Other intervertebral disc degeneration, lumbar region: Secondary | ICD-10-CM | POA: Diagnosis not present

## 2021-12-05 NOTE — Progress Notes (Signed)
Name: Andrea Randolph   MRN: [id number]    DOB: 03/01/1956   Date:12/06/2021       Progress Note  Subjective  Chief Complaint  Follow Up  HPI  DMII: she is currently taking Farxiga and Rybelsus, off Metformin . A1C is still under control, today at 6.4 %  She has dyslipidemia, HTN , CKI and obesity. She is on ARB, and Zetia  She denies polyphagia, polydipsia or polyuria. She states statin therapy caused myalgia   CKI stage III: it was 7 June 22 and it went down to 47 Dec 2022, she has good urine output, no pruritis   LVH and aortic stenosis: under care of Dr. Rockey Situ, no chest pain, palpitation or sob, she denies orthopnea   Vitamin D low: she is now taking 2000 units daily Unchanged    Surgical hypothyroidism: she has been taking levothyroxine 125 mcg  daily and last TSH was at goal, denies dry skin, no change in bowel movements.  Morbid obesity: BMI above 35 with co-morbidities. She has DM, HTN, dyslipidemia and OA. She states not feeling well since she started on Arimdex, craving chocolate but not as bad lately. Weight is trending up since not physically active due to joint aches    OA/Knee bilateral: saw Dr. Tamala Julian 09/22 and had steroid injection , pain is down to 2/10 on left knee, using Voltaren gel now since she got it from [facility]. She also has DDD and takes gabapentin, currently 200 mg at night   Generalized arthralgias and myalgias: she noticed it after starting Arimidex earlier this year, she is now on Aromasin and also off statin therapy ( on hold per Dr. Rockey Situ ) pain is not as severe now but she is still stiff when she gets up in the mornings She had positive ANA, slightly elevated CK we referred her to Rheumatologist . Dr. Posey Pronto saw her January 23 and thinks pain related to DDD lumbar spine and advised stretching exercises and PT, abnormal labs may be secondary to thyroid disease . He will re-evaluate her in 4 months.    Hyperlipidemia: she was Pravastatin  and Zetia . Recently off  statin therapy due to muscle aches and elevated CK, last LDL was 116, goal is below 70, she has been compliant with Zetia , we will recheck labs today    OSA: she does not tolerate CPAP machine. Unchanged    Breast Cancer right : diagnosed by abnormal mammogram back in March 22, lumpectomy was done 03/06/2021 followed by 5 weeks of radiation and finished 05/03/2021. She was doing well until she started on Arimidex, she states her entire body hurts, joint are stiff and aching, difficulty sleeping, change in bowel movement, increase in appetite. She is now off Arimidex and on Aromasin 25 mg still feels stiff and not able to go to the gym and gaining weight but side effects not as severe. Advised her to resume physical activity   Patient Active Problem List   Diagnosis Date Noted   Aromatase inhibitor use 05/30/2021   Complaints of total body pain 05/30/2021   Malignant 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, estrogen receptor positive ([place]) 02/20/2021   Thrombocytosis 11/01/2019   Carpal tunnel syndrome, bilateral 02/19/2019   Trigger finger of right thumb 02/19/2019   Flexor tenosynovitis of finger 02/19/2019   Severe obesity (BMI 35.0-35.9 with comorbidity) ([place]) 12/29/2018   Mild concentric left ventricular hypertrophy (LVH) 12/24/2017   Mild aortic stenosis by prior echocardiogram 12/24/2017  Mild aortic regurgitation 12/24/2017   CKD (chronic kidney disease), stage I 06/22/2016   Uterine fibroid 01/11/2016   Allergic rhinitis, seasonal 05/24/2015   Benign essential HTN 05/24/2015   Chronic constipation 05/24/2015   Diabetes mellitus with renal manifestation ([place]) 05/24/2015   Dyslipidemia 05/24/2015   Post-surgical hypothyroidism 05/24/2015   Mixed incontinence 05/24/2015   Microalbuminuria 05/24/2015   Menopause 05/24/2015   Obstructive apnea 05/24/2015   Morbid obesity ([place]) 05/24/2015   Hypertensive retinopathy 05/24/2015   Vitamin D deficiency 05/24/2015    Status post total replacement of right shoulder 03/11/2015    Past Surgical History:  Procedure Laterality Date   BREAST BIOPSY Left 90's   benign core needle    BREAST BIOPSY Right 02/07/2021   [place] Bx, Q-clip, path pending    BREAST LUMPECTOMY WITH SENTINEL LYMPH NODE BIOPSY Right 03/06/2021   Procedure: BREAST LUMPECTOMY WITH SENTINEL LYMPH NODE BX;  Surgeon: Robert Bellow, MD;  Location: [facility];  Service: General;  Laterality: Right;   COLONOSCOPY WITH PROPOFOL N/A 01/26/2019   Procedure: COLONOSCOPY WITH PROPOFOL;  Surgeon: Lin Landsman, MD;  Location: [place];  Service: Gastroenterology;  Laterality: N/A;   FOOT SURGERY Bilateral    heel spurs   JOINT REPLACEMENT     TOTAL SHOULDER REPLACEMENT Right 01/25/2015   Dr. Roland Rack   TOTAL THYROIDECTOMY  08/01/2018    Family History  Problem Relation Age of Onset   Heart disease Mother    Heart disease Father    Kidney disease Sister    Hypertension Sister    Diabetes Sister    Hypertension Brother    CVA Brother    Breast cancer Maternal Aunt 79   Hypertension Sister    Kidney failure Sister    Ovarian cancer Neg Hx    Colon cancer Neg Hx     Social History   Tobacco Use   Smoking status: Never   Smokeless tobacco: Never  Substance Use Topics   Alcohol use: No    Alcohol/week: 0.0 standard drinks     Current Outpatient Medications:    aspirin 81 MG tablet, Take 81 mg by mouth daily., Disp: , Rfl:    Calcium Carb-Cholecalciferol (CALCIUM 500+D3 PO), Take 2 tablets by mouth daily., Disp: , Rfl:    Cholecalciferol (VITAMIN D3) 1000 units CAPS, Take 1,000 Units by mouth daily., Disp: , Rfl:    dapagliflozin propanediol (FARXIGA) 10 MG TABS tablet, TAKE 1 TABLET BY MOUTH ONCE DAILY BEFORE BREAKFAST IN PLACE OF XIGDUO., Disp: 90 tablet, Rfl: 3   diclofenac Sodium (VOLTAREN) 1 % GEL, Apply 2 g topically 4 (four) times daily as needed., Disp: 100 g, Rfl: 2   exemestane (AROMASIN) 25 MG tablet, Take 1  tablet (25 mg total) by mouth daily., Disp: 30 tablet, Rfl: 4   ezetimibe (ZETIA) 10 MG tablet, Take 1 tablet (10 mg total) by mouth daily., Disp: 30 tablet, Rfl: 5   gabapentin (NEURONTIN) 100 MG capsule, Take 1 capsule by mouth 3 (three) times daily., Disp: , Rfl:    ketoconazole (NIZORAL) 2 % cream, Apply 1 application topically daily., Disp: 60 g, Rfl: 0   levothyroxine (SYNTHROID) 125 MCG tablet, Take 1 tablet by mouth once daily, Disp: 90 tablet, Rfl: 0   Olmesartan-amLODIPine-HCTZ 40-5-25 MG TABS, TAKE 1 TABLET DAILY (SCHEDULE APPOINTMENT FOR FURTHER REFILLS), Disp: 90 tablet, Rfl: 3   glucose blood test strip, FREESTYLE LITE TEST (In Vitro Strip)  use as directed for 0 days  Quantity: 200.00;  Refills: 3   Ordered :25-Nov-2014  Vonna Kotyk ;  Started 30-Dec-2008 Active Comments: DX: 250.00 (Patient not taking: Reported on 12/06/2021), Disp: , Rfl:    Semaglutide (RYBELSUS) 14 MG TABS, Take 14 mg by mouth daily., Disp: 90 tablet, Rfl: 1  Allergies  Allergen Reactions   Rosuvastatin Other (See Comments)    Myalgia     I personally reviewed active problem list, medication list, allergies, family history, social history, health maintenance with the patient/caregiver today.   ROS  Constitutional: Negative for fever or weight change.  Respiratory: Negative for cough and shortness of breath.   Cardiovascular: Negative for chest pain or palpitations.  Gastrointestinal: Negative for abdominal pain, no bowel changes.  Musculoskeletal: Negative for gait problem or joint swelling.  Skin: Negative for rash.  Neurological: Negative for dizziness or headache.  No other specific complaints in a complete review of systems (except as listed in HPI above).   Objective  Vitals:   12/06/21 0756  BP: 124/82  Pulse: 90  Resp: 16  Temp: 98.2 F (36.8 C)  TempSrc: Oral  SpO2: 99%  Weight: 226 lb 4.8 oz (102.6 kg)  Height: 5\' 4"  (1.626 m)    Body mass index is 38.84 kg/m.  Physical  Exam  Constitutional: Patient appears well-developed and well-nourished. Obese  No distress.  HEENT: head atraumatic, normocephalic, pupils equal and reactive to light, neck supple Cardiovascular: Normal rate, regular rhythm and normal heart sounds.  No murmur heard. No BLE edema. Pulmonary/Chest: Effort normal and breath sounds normal. No respiratory distress. Abdominal: Soft.  There is no tenderness. Psychiatric: Patient has a normal mood and affect. behavior is normal. Judgment and thought content normal.   Recent Results (from the past 2160 hour(s))  Comprehensive metabolic panel     Status: Abnormal   Collection Time: 11/08/21 10:49 AM  Result Value Ref Range   Sodium 138 135 - 145 mmol/L   Potassium 3.7 3.5 - 5.1 mmol/L   Chloride 101 98 - 111 mmol/L   CO2 28 22 - 32 mmol/L   Glucose, Bld 97 70 - 99 mg/dL    Comment: Glucose reference range applies only to samples taken after fasting for at least 8 hours.   BUN 18 8 - 23 mg/dL   Creatinine, Ser 1.27 (H) 0.44 - 1.00 mg/dL   Calcium 9.2 8.9 - 10.3 mg/dL   Total Protein 7.2 6.5 - 8.1 g/dL   Albumin 3.9 3.5 - 5.0 g/dL   AST 17 15 - 41 U/L   ALT 19 0 - 44 U/L   Alkaline Phosphatase 37 (L) 38 - 126 U/L   Total Bilirubin 0.2 (L) 0.3 - 1.2 mg/dL   GFR, Estimated 47 (L) >60 mL/min    Comment: (NOTE) Calculated using the CKD-EPI Creatinine Equation (2021)    Anion gap 9 5 - 15    Comment: Performed at [facility], [address] [place], [place] [address]  CBC with Differential/Platelet     Status: Abnormal   Collection Time: 11/08/21 10:49 AM  Result Value Ref Range   WBC 3.3 (L) 4.0 - 10.5 K/uL   RBC 4.87 3.87 - 5.11 MIL/uL   Hemoglobin 13.4 12.0 - 15.0 g/dL   HCT 40.8 36.0 - 46.0 %   MCV 83.8 80.0 - 100.0 fL   MCH 27.5 26.0 - 34.0 pg   MCHC 32.8 30.0 - 36.0 g/dL   RDW 13.9 11.5 - 15.5 %   Platelets 307 150 - 400 K/uL  nRBC 0.0 0.0 - 0.2 %   Neutrophils Relative % 45 %   Neutro Abs 1.5 (L) 1.7 - 7.7 K/uL    Lymphocytes Relative 43 %   Lymphs Abs 1.4 0.7 - 4.0 K/uL   Monocytes Relative 9 %   Monocytes Absolute 0.3 0.1 - 1.0 K/uL   Eosinophils Relative 2 %   Eosinophils Absolute 0.1 0.0 - 0.5 K/uL   Basophils Relative 1 %   Basophils Absolute 0.0 0.0 - 0.1 K/uL   Immature Granulocytes 0 %   Abs Immature Granulocytes 0.01 0.00 - 0.07 K/uL    Comment: Performed at [facility], [place], [place] [address]     PHQ2/9: Depression screen [facility] 2/9 12/06/2021 09/05/2021 08/30/2021 06/20/2021 05/29/2021  Decreased Interest 0 0 1 0 0  Down, Depressed, Hopeless 0 0 0 0 0  PHQ - 2 Score 0 0 1 0 0  Altered sleeping - - 1 0 -  Tired, decreased energy - - 0 0 -  Change in appetite - - 0 0 -  Feeling bad or failure about yourself  - - 0 0 -  Trouble concentrating - - 0 0 -  Moving slowly or fidgety/restless - - 0 0 -  Suicidal thoughts - - 0 0 -  PHQ-9 Score - - 2 0 -  Difficult doing work/chores - - - Not difficult at all -  Some recent data might be hidden    phq 9 is negative   Fall Risk: Fall Risk  12/06/2021 09/05/2021 08/30/2021 06/20/2021 05/29/2021  Falls in the past year? 0 0 1 0 1  Number falls in past yr: 0 0 0 0 0  Injury with Fall? 0 0 0 0 0  Risk for fall due to : - - No Fall Risks - -  Follow up Falls evaluation completed Falls evaluation completed Falls prevention discussed - -      Functional Status Survey: Is the patient deaf or have difficulty hearing?: No Does the patient have difficulty seeing, even when wearing glasses/contacts?: No Does the patient have difficulty concentrating, remembering, or making decisions?: No Does the patient have difficulty walking or climbing stairs?: No Does the patient have difficulty dressing or bathing?: No Does the patient have difficulty doing errands alone such as visiting a doctor's office or shopping?: No    Assessment & Plan  1. Type 2 diabetes mellitus with stage 3a chronic kidney disease, without long-term  current use of insulin (HCC)  - POCT HgB A1C  2. Stage 3a chronic kidney disease (HCC)  - Microalbumin / creatinine urine ratio - COMPLETE METABOLIC PANEL WITH GFR - VITAMIN D 25 Hydroxy (Vit-D Deficiency, Fractures) - Parathyroid hormone, intact (no Ca)  3. Morbid obesity ([place])  Discussed with the patient the risk posed by an increased BMI. Discussed importance of portion control, calorie counting and at least 150 minutes of physical activity weekly. Avoid sweet beverages and drink more water. Eat at least 6 servings of fruit and vegetables daily    4. Dyslipidemia due to type 2 diabetes mellitus ([place])  - Lipid panel  5. Benign essential HTN  - COMPLETE METABOLIC PANEL WITH GFR  6. Obstructive apnea   7. Post-surgical hypothyroidism  - TSH  8. Polyarthralgia  stable  9. Vitamin D deficiency   10. Dyslipidemia   11. Needs flu shot  - Flu Vaccine QUAD 6+ mos PF IM (Fluarix Quad PF)  12. Colon cancer screening  - Ambulatory referral to  Gastroenterology  13. Mixed hyperlipidemia  - ezetimibe (ZETIA) 10 MG tablet; Take 1 tablet (10 mg total) by mouth daily.  Dispense: 90 tablet; Refill: 1

## 2021-12-06 ENCOUNTER — Other Ambulatory Visit: Payer: Self-pay

## 2021-12-06 ENCOUNTER — Ambulatory Visit: Payer: BC Managed Care – PPO | Admitting: Family Medicine

## 2021-12-06 ENCOUNTER — Encounter: Payer: Self-pay | Admitting: Family Medicine

## 2021-12-06 VITALS — BP 124/82 | HR 90 | Temp 98.2°F | Resp 16 | Ht 64.0 in | Wt 226.3 lb

## 2021-12-06 DIAGNOSIS — G720 Drug-induced myopathy: Secondary | ICD-10-CM

## 2021-12-06 DIAGNOSIS — Z23 Encounter for immunization: Secondary | ICD-10-CM

## 2021-12-06 DIAGNOSIS — E1122 Type 2 diabetes mellitus with diabetic chronic kidney disease: Secondary | ICD-10-CM | POA: Diagnosis not present

## 2021-12-06 DIAGNOSIS — E782 Mixed hyperlipidemia: Secondary | ICD-10-CM

## 2021-12-06 DIAGNOSIS — M2550 Pain in unspecified joint: Secondary | ICD-10-CM

## 2021-12-06 DIAGNOSIS — I10 Essential (primary) hypertension: Secondary | ICD-10-CM | POA: Diagnosis not present

## 2021-12-06 DIAGNOSIS — N1831 Chronic kidney disease, stage 3a: Secondary | ICD-10-CM | POA: Diagnosis not present

## 2021-12-06 DIAGNOSIS — E785 Hyperlipidemia, unspecified: Secondary | ICD-10-CM | POA: Diagnosis not present

## 2021-12-06 DIAGNOSIS — Z860100 Personal history of colon polyps, unspecified: Secondary | ICD-10-CM

## 2021-12-06 DIAGNOSIS — E559 Vitamin D deficiency, unspecified: Secondary | ICD-10-CM

## 2021-12-06 DIAGNOSIS — T466X5A Adverse effect of antihyperlipidemic and antiarteriosclerotic drugs, initial encounter: Secondary | ICD-10-CM

## 2021-12-06 DIAGNOSIS — G4733 Obstructive sleep apnea (adult) (pediatric): Secondary | ICD-10-CM

## 2021-12-06 DIAGNOSIS — Z1211 Encounter for screening for malignant neoplasm of colon: Secondary | ICD-10-CM

## 2021-12-06 DIAGNOSIS — E890 Postprocedural hypothyroidism: Secondary | ICD-10-CM

## 2021-12-06 DIAGNOSIS — E1169 Type 2 diabetes mellitus with other specified complication: Secondary | ICD-10-CM

## 2021-12-06 LAB — POCT GLYCOSYLATED HEMOGLOBIN (HGB A1C): Hemoglobin A1C: 6.4 % — AB (ref 4.0–5.6)

## 2021-12-06 MED ORDER — NA SULFATE-K SULFATE-MG SULF 17.5-3.13-1.6 GM/177ML PO SOLN: 1.0000 | Freq: Once | ORAL | 0 refills | Status: AC

## 2021-12-06 MED ORDER — EZETIMIBE 10 MG PO TABS: 10.0000 mg | ORAL_TABLET | Freq: Every day | ORAL | 1 refills | Status: DC

## 2021-12-06 NOTE — Progress Notes (Signed)
Gastroenterology Pre-Procedure Review  Request Date: 02/09/2022 Requesting Physician: Dr. Vicente Males  PATIENT REVIEW QUESTIONS: The patient responded to the following health history questions as indicated:    1. Are you having any GI issues? no 2. Do you have a personal history of Polyps? yes (last colonoscopy) 3. Do you have a family history of Colon Cancer or Polyps? no 4. Diabetes Mellitus? yes (type 2) 5. Joint replacements in the past 12 months?no 6. Major health problems in the past 3 months?no 7. Any artificial heart valves, MVP, or defibrillator?no    MEDICATIONS & ALLERGIES:    Patient reports the following regarding taking any anticoagulation/antiplatelet therapy:   Plavix, Coumadin, Eliquis, Xarelto, Lovenox, Pradaxa, Brilinta, or Effient? no Aspirin? no  Patient confirms/reports the following medications:  Current Outpatient Medications  Medication Sig Dispense Refill   aspirin 81 MG tablet Take 81 mg by mouth daily.     Calcium Carb-Cholecalciferol (CALCIUM 500+D3 PO) Take 2 tablets by mouth daily.     Cholecalciferol (VITAMIN D3) 1000 units CAPS Take 1,000 Units by mouth daily.     dapagliflozin propanediol (FARXIGA) 10 MG TABS tablet TAKE 1 TABLET BY MOUTH ONCE DAILY BEFORE BREAKFAST IN PLACE OF XIGDUO. 90 tablet 3   diclofenac Sodium (VOLTAREN) 1 % GEL Apply 2 g topically 4 (four) times daily as needed. 100 g 2   exemestane (AROMASIN) 25 MG tablet Take 1 tablet (25 mg total) by mouth daily. 30 tablet 4   ezetimibe (ZETIA) 10 MG tablet Take 1 tablet (10 mg total) by mouth daily. 90 tablet 1   gabapentin (NEURONTIN) 100 MG capsule Take 1 capsule by mouth 3 (three) times daily.     glucose blood test strip FREESTYLE LITE TEST (In Vitro Strip)  use as directed for 0 days  Quantity: 200.00;  Refills: 3   Ordered :25-Nov-2014  Vonna Kotyk ;  Started 30-Dec-2008 Active Comments: DX: 250.00     ketoconazole (NIZORAL) 2 % cream Apply 1 application topically daily. 60 g 0    levothyroxine (SYNTHROID) 125 MCG tablet Take 1 tablet by mouth once daily 90 tablet 0   Olmesartan-amLODIPine-HCTZ 40-5-25 MG TABS TAKE 1 TABLET DAILY (SCHEDULE APPOINTMENT FOR FURTHER REFILLS) 90 tablet 3   Semaglutide (RYBELSUS) 14 MG TABS Take 14 mg by mouth daily. 90 tablet 1   No current facility-administered medications for this visit.    Patient confirms/reports the following allergies:  Allergies  Allergen Reactions   Rosuvastatin Other (See Comments)    Myalgia     No orders of the defined types were placed in this encounter.   AUTHORIZATION INFORMATION Primary Insurance: 1D#: Group #:  Secondary Insurance: 1D#: Group #:  SCHEDULE INFORMATION: Date: 02/09/2022 Time: Location:[facility]

## 2021-12-07 LAB — VITAMIN D 25 HYDROXY (VIT D DEFICIENCY, FRACTURES): Vit D, 25-Hydroxy: 52 ng/mL (ref 30–100)

## 2021-12-07 LAB — COMPLETE METABOLIC PANEL WITH GFR
AG Ratio: 1.4 (calc) (ref 1.0–2.5)
ALT: 16 U/L (ref 6–29)
AST: 16 U/L (ref 10–35)
Albumin: 4.2 g/dL (ref 3.6–5.1)
Alkaline phosphatase (APISO): 40 U/L (ref 37–153)
BUN/Creatinine Ratio: 14 (calc) (ref 6–22)
BUN: 17 mg/dL (ref 7–25)
CO2: 33 mmol/L — ABNORMAL HIGH (ref 20–32)
Calcium: 9.8 mg/dL (ref 8.6–10.4)
Chloride: 103 mmol/L (ref 98–110)
Creat: 1.2 mg/dL — ABNORMAL HIGH (ref 0.50–1.05)
Globulin: 3.1 g/dL (calc) (ref 1.9–3.7)
Glucose, Bld: 96 mg/dL (ref 65–99)
Potassium: 4.4 mmol/L (ref 3.5–5.3)
Sodium: 141 mmol/L (ref 135–146)
Total Bilirubin: 0.4 mg/dL (ref 0.2–1.2)
Total Protein: 7.3 g/dL (ref 6.1–8.1)
eGFR: 51 mL/min/{1.73_m2} — ABNORMAL LOW (ref >=60)

## 2021-12-07 LAB — TSH: TSH: 1.14 mIU/L (ref 0.40–4.50)

## 2021-12-07 LAB — PARATHYROID HORMONE, INTACT (NO CA): PTH: 43 pg/mL (ref 16–77)

## 2021-12-07 LAB — LIPID PANEL
Cholesterol: 255 mg/dL — ABNORMAL HIGH (ref <200)
HDL: 49 mg/dL — ABNORMAL LOW (ref >=50)
LDL Cholesterol (Calc): 189 mg/dL (calc) — ABNORMAL HIGH
Non-HDL Cholesterol (Calc): 206 mg/dL (calc) — ABNORMAL HIGH (ref <130)
Total CHOL/HDL Ratio: 5.2 (calc) — ABNORMAL HIGH (ref <5.0)
Triglycerides: 65 mg/dL (ref <150)

## 2021-12-07 LAB — MICROALBUMIN / CREATININE URINE RATIO
Creatinine, Urine: 109 mg/dL (ref 20–275)
Microalb Creat Ratio: 15 mcg/mg creat (ref <30)
Microalb, Ur: 1.6 mg/dL

## 2021-12-12 DIAGNOSIS — M5136 Other intervertebral disc degeneration, lumbar region: Secondary | ICD-10-CM | POA: Diagnosis not present

## 2021-12-14 ENCOUNTER — Encounter: Payer: Self-pay | Admitting: Family Medicine

## 2021-12-14 ENCOUNTER — Other Ambulatory Visit: Payer: Self-pay | Admitting: Family Medicine

## 2021-12-14 DIAGNOSIS — I517 Cardiomegaly: Secondary | ICD-10-CM

## 2021-12-14 DIAGNOSIS — E1169 Type 2 diabetes mellitus with other specified complication: Secondary | ICD-10-CM

## 2021-12-14 DIAGNOSIS — G720 Drug-induced myopathy: Secondary | ICD-10-CM

## 2021-12-14 DIAGNOSIS — E785 Hyperlipidemia, unspecified: Secondary | ICD-10-CM

## 2021-12-14 MED ORDER — PRALUENT 75 MG/ML ~~LOC~~ SOAJ: 75.0000 mg | SUBCUTANEOUS | 5 refills | Status: DC

## 2021-12-19 ENCOUNTER — Encounter: Payer: Self-pay | Admitting: Unknown Physician Specialty

## 2021-12-19 ENCOUNTER — Ambulatory Visit: Payer: BC Managed Care – PPO | Admitting: Unknown Physician Specialty

## 2021-12-19 ENCOUNTER — Encounter: Payer: Self-pay | Admitting: Family Medicine

## 2021-12-19 VITALS — BP 164/88 | HR 105 | Temp 98.1°F | Resp 16 | Ht 64.0 in | Wt 227.5 lb

## 2021-12-19 DIAGNOSIS — M5431 Sciatica, right side: Secondary | ICD-10-CM

## 2021-12-19 MED ORDER — CYCLOBENZAPRINE HCL 10 MG PO TABS: 10.0000 mg | ORAL_TABLET | Freq: Three times a day (TID) | ORAL | 0 refills | Status: DC | PRN

## 2021-12-19 MED ORDER — METHYLPREDNISOLONE 4 MG PO TBPK: ORAL_TABLET | ORAL | 0 refills | Status: DC

## 2021-12-19 NOTE — Progress Notes (Signed)
BP (!) 164/88    Pulse (!) 105    Temp 98.1 F (36.7 C) (Oral)    Resp 16    Ht _0  (1.626 m)    Wt 227 lb 8 oz (103.2 kg)    SpO2 98%    BMI 39.05 kg/m    Subjective:    Patient ID: Andrea Randolph, female    DOB: 24-May-1956, 66 y.o.   MRN: [id number]  HPI: Andrea Randolph is a 66 y.o. female  Chief Complaint  Patient presents with   Hip Pain    Right hip to knee pain started 11/17/21. Pt denies injuring it, also has not taken her BP pill yet.   Hip Pain  The incident occurred 12 to 24 hours ago. Incident location: At home getting out of the shower.  Felt a "catch and it hasn't turned loose" The injury mechanism is unknown. The pain is present in the right hip. The pain is at a severity of 9/10. The pain is severe. The pain has been Constant since onset. Pertinent negatives include no inability to bear weight, loss of motion, loss of sensation, muscle weakness, numbness or tingling. Associated symptoms comments: Walking but "walking crazy". Nothing aggravates the symptoms. She has tried acetaminophen and NSAIDs for the symptoms. The treatment provided no relief.    Relevant past medical, surgical, family and social history reviewed and updated as indicated. Interim medical history since our last visit reviewed. Allergies and medications reviewed and updated.  Review of Systems  Neurological:  Negative for tingling and numbness.   Per HPI unless specifically indicated above     Objective:    BP (!) 164/88    Pulse (!) 105    Temp 98.1 F (36.7 C) (Oral)    Resp 16    Ht _1  (1.626 m)    Wt 227 lb 8 oz (103.2 kg)    SpO2 98%    BMI 39.05 kg/m   Wt Readings from Last 3 Encounters:  12/19/21 227 lb 8 oz (103.2 kg)  12/06/21 226 lb 4.8 oz (102.6 kg)  11/10/21 223 lb (101.2 kg)    Physical Exam Constitutional:      General: She is not in acute distress.    Appearance: Normal appearance. She is well-developed.  HENT:     Head: Normocephalic and atraumatic.  Eyes:     General:  Lids are normal. No scleral icterus.       Right eye: No discharge.        Left eye: No discharge.     Conjunctiva/sclera: Conjunctivae normal.  Neck:     Vascular: No carotid bruit or JVD.  Cardiovascular:     Rate and Rhythm: Normal rate and regular rhythm.     Heart sounds: Normal heart sounds.  Pulmonary:     Effort: Pulmonary effort is normal. No respiratory distress.     Breath sounds: Normal breath sounds.  Abdominal:     Palpations: There is no hepatomegaly or splenomegaly.  Musculoskeletal:     Cervical back: Normal range of motion and neck supple.     Lumbar back: No swelling or tenderness. Normal range of motion. Positive right straight leg raise test. Negative left straight leg raise test.  Skin:    General: Skin is warm and dry.     Coloration: Skin is not pale.     Findings: No rash.  Neurological:     Mental Status: She is alert and oriented to person,  place, and time.     Motor: No weakness.     Deep Tendon Reflexes:     Reflex Scores:      Patellar reflexes are 3+ on the right side and 3+ on the left side.    Comments: No lower extremity weakness.    Psychiatric:        Behavior: Behavior normal.        Thought Content: Thought content normal.        Judgment: Judgment normal.    Results for orders placed or performed in visit on 12/06/21  Lipid panel  Result Value Ref Range   Cholesterol 255 (H) <200 mg/dL   HDL 49 (L) > OR = 50 mg/dL   Triglycerides 65 <150 mg/dL   LDL Cholesterol (Calc) 189 (H) mg/dL (calc)   Total CHOL/HDL Ratio 5.2 (H) <5.0 (calc)   Non-HDL Cholesterol (Calc) 206 (H) <130 mg/dL (calc)  Microalbumin / creatinine urine ratio  Result Value Ref Range   Creatinine, Urine 109 20 - 275 mg/dL   Microalb, Ur 1.6 mg/dL   Microalb Creat Ratio 15 <30 mcg/mg creat  COMPLETE METABOLIC PANEL WITH GFR  Result Value Ref Range   Glucose, Bld 96 65 - 99 mg/dL   BUN 17 7 - 25 mg/dL   Creat 1.20 (H) 0.50 - 1.05 mg/dL   eGFR 51 (L) > OR = 60  mL/min/1.75m   BUN/Creatinine Ratio 14 6 - 22 (calc)   Sodium 141 135 - 146 mmol/L   Potassium 4.4 3.5 - 5.3 mmol/L   Chloride 103 98 - 110 mmol/L   CO2 33 (H) 20 - 32 mmol/L   Calcium 9.8 8.6 - 10.4 mg/dL   Total Protein 7.3 6.1 - 8.1 g/dL   Albumin 4.2 3.6 - 5.1 g/dL   Globulin 3.1 1.9 - 3.7 g/dL (calc)   AG Ratio 1.4 1.0 - 2.5 (calc)   Total Bilirubin 0.4 0.2 - 1.2 mg/dL   Alkaline phosphatase (APISO) 40 37 - 153 U/L   AST 16 10 - 35 U/L   ALT 16 6 - 29 U/L  TSH  Result Value Ref Range   TSH 1.14 0.40 - 4.50 mIU/L  VITAMIN D 25 Hydroxy (Vit-D Deficiency, Fractures)  Result Value Ref Range   Vit D, 25-Hydroxy 52 30 - 100 ng/mL  Parathyroid hormone, intact (no Ca)  Result Value Ref Range   PTH 43 16 - 77 pg/mL  POCT HgB A1C  Result Value Ref Range   Hemoglobin A1C 6.4 (A) 4.0 - 5.6 %   HbA1c POC (<> result, manual entry)     HbA1c, POC (prediabetic range)     HbA1c, POC (controlled diabetic range)        Assessment & Plan:   Problem List Items Addressed This Visit   None Visit Diagnoses     Sciatica of right side    -  Primary   Sciatica.  Already working with PT and ortho for back and was doing well.  GFR of 50 and will defer high dose NSAIDs.  Medrol dose pack.         Pt ed on high BS though lst Hgb A1C was 6.2  Will refer back to ortho for MRI if no improvement.  Already taking Gabapentin.  Rx for Flexeril  Follow up plan: With orthopedics for MRI if no improvement

## 2021-12-25 DIAGNOSIS — M5136 Other intervertebral disc degeneration, lumbar region: Secondary | ICD-10-CM | POA: Diagnosis not present

## 2022-01-02 DIAGNOSIS — M4726 Other spondylosis with radiculopathy, lumbar region: Secondary | ICD-10-CM | POA: Diagnosis not present

## 2022-01-03 ENCOUNTER — Other Ambulatory Visit (HOSPITAL_COMMUNITY): Payer: Self-pay | Admitting: Orthopedic Surgery

## 2022-01-03 ENCOUNTER — Other Ambulatory Visit: Payer: Self-pay | Admitting: Orthopedic Surgery

## 2022-01-03 DIAGNOSIS — M4726 Other spondylosis with radiculopathy, lumbar region: Secondary | ICD-10-CM

## 2022-01-10 NOTE — Progress Notes (Signed)
Name: Andrea Randolph   MRN: [id number]    DOB: 1955-11-28   Date:01/11/2022       Progress Note  Subjective  Chief Complaint  Annual Exam   HPI  Patient presents for annual CPE.  Diet: trying to eat better, eating fruit in place of sweets.  Exercise: discussed 150 minutes physical activity per week  - discussed water activity   [place] Office Visit from 01/11/2022 in [facility]  AUDIT-C Score 0      Depression: Phq 9 is  negative Depression screen [facility] 2/9 01/11/2022 12/19/2021 12/06/2021 09/05/2021 08/30/2021  Decreased Interest 1 0 0 0 1  Down, Depressed, Hopeless 1 0 0 0 0  PHQ - 2 Score 2 0 0 0 1  Altered sleeping 1 0 - - 1  Tired, decreased energy 1 0 - - 0  Change in appetite 1 0 - - 0  Feeling bad or failure about yourself  0 0 - - 0  Trouble concentrating 0 0 - - 0  Moving slowly or fidgety/restless 0 0 - - 0  Suicidal thoughts 0 0 - - 0  PHQ-9 Score 5 0 - - 2  Difficult doing work/chores Somewhat difficult Not difficult at all - - -  Some recent data might be hidden   Hypertension: BP Readings from Last 3 Encounters:  01/11/22 136/74  12/19/21 (!) 164/88  12/06/21 124/82   Obesity: Wt Readings from Last 3 Encounters:  01/11/22 217 lb 4.8 oz (98.6 kg)  12/19/21 227 lb 8 oz (103.2 kg)  12/06/21 226 lb 4.8 oz (102.6 kg)   BMI Readings from Last 3 Encounters:  01/11/22 37.30 kg/m  12/19/21 39.05 kg/m  12/06/21 38.84 kg/m     Vaccines:   HPV: N/A Tdap: up to date Shingrix: up to date Pneumonia: up to date Flu: up to date COVID-94: declined   Hep C Screening: 12/24/17 STD testing and prevention (HIV/chl/gon/syphilis): 12/24/17 Intimate partner violence: negative Sexual History : not sexually active in the past few years  Menstrual History/LMP/Abnormal Bleeding:  Discussed importance of follow up if any post-menopausal bleeding: yes Incontinence Symptoms: No.  Breast cancer:  - Last Mammogram: scheduled 01/15/22 - BRCA gene  screening: personal history of breast cancer   Osteoporosis Prevention : Discussed high calcium and vitamin D supplementation, weight bearing exercises Bone density :done 07/22 and normal   Cervical cancer screening: 01/26/21  Skin cancer: Discussed monitoring for atypical lesions  Colorectal cancer: scheduled for this month  Lung cancer:  Low Dose CT Chest recommended if Age 21-80 years, 20 pack-year currently smoking OR have quit w/in 15years. Patient does not qualify.   ECG: 07/10/21  Advanced Care Planning: A voluntary discussion about advance care planning including the explanation and discussion of advance directives.  Discussed health care proxy and Living will, and the patient was able to identify a health care proxy as daughter .  Patient does not have a living will or power of attorney of health care   Lipids: Lab Results  Component Value Date   CHOL 255 (H) 12/06/2021   CHOL 186 01/26/2021   CHOL 176 10/20/2020   Lab Results  Component Value Date   HDL 49 (L) 12/06/2021   HDL 55 01/26/2021   HDL 48 (L) 10/20/2020   Lab Results  Component Value Date   LDLCALC 189 (H) 12/06/2021   LDLCALC 116 (H) 01/26/2021   LDLCALC 112 (H) 10/20/2020   Lab Results  Component Value Date  TRIG 65 12/06/2021   TRIG 63 01/26/2021   TRIG 70 10/20/2020   Lab Results  Component Value Date   CHOLHDL 5.2 (H) 12/06/2021   CHOLHDL 3.4 01/26/2021   CHOLHDL 3.7 10/20/2020   No results found for: LDLDIRECT  Glucose: Glucose, Bld  Date Value Ref Range Status  12/06/2021 96 65 - 99 mg/dL Final    Comment:    .            Fasting reference interval .   11/08/2021 97 70 - 99 mg/dL Final    Comment:    Glucose reference range applies only to samples taken after fasting for at least 8 hours.  07/04/2021 112 (H) 70 - 99 mg/dL Final    Comment:    Glucose reference range applies only to samples taken after fasting for at least 8 hours.   Glucose-Capillary  Date Value Ref Range  Status  03/06/2021 119 (H) 70 - 99 mg/dL Final    Comment:    Glucose reference range applies only to samples taken after fasting for at least 8 hours.  03/06/2021 112 (H) 70 - 99 mg/dL Final    Comment:    Glucose reference range applies only to samples taken after fasting for at least 8 hours.  01/26/2019 98 70 - 99 mg/dL Final    Patient Active Problem List   Diagnosis Date Noted   Aromatase inhibitor use 05/30/2021   Complaints of total body pain 05/30/2021   Malignant 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, estrogen receptor positive ([place]) 02/20/2021   Thrombocytosis 11/01/2019   Carpal tunnel syndrome, bilateral 02/19/2019   Trigger finger of right thumb 02/19/2019   Flexor tenosynovitis of finger 02/19/2019   Severe obesity (BMI 35.0-35.9 with comorbidity) ([place]) 12/29/2018   Mild concentric left ventricular hypertrophy (LVH) 12/24/2017   Mild aortic stenosis by prior echocardiogram 12/24/2017   Mild aortic regurgitation 12/24/2017   CKD (chronic kidney disease), stage I 06/22/2016   Uterine fibroid 01/11/2016   Allergic rhinitis, seasonal 05/24/2015   Benign essential HTN 05/24/2015   Chronic constipation 05/24/2015   Diabetes mellitus with renal manifestation ([place]) 05/24/2015   Dyslipidemia 05/24/2015   Post-surgical hypothyroidism 05/24/2015   Mixed incontinence 05/24/2015   Microalbuminuria 05/24/2015   Menopause 05/24/2015   Obstructive apnea 05/24/2015   Morbid obesity ([place]) 05/24/2015   Hypertensive retinopathy 05/24/2015   Vitamin D deficiency 05/24/2015   Status post total replacement of right shoulder 03/11/2015    Past Surgical History:  Procedure Laterality Date   BREAST BIOPSY Left 90's   benign core needle    BREAST BIOPSY Right 02/07/2021   [place] Bx, Q-clip, path pending    BREAST LUMPECTOMY WITH SENTINEL LYMPH NODE BIOPSY Right 03/06/2021   Procedure: BREAST LUMPECTOMY WITH SENTINEL LYMPH NODE BX;  Surgeon: Robert Bellow, MD;   Location: [facility] ORS;  Service: General;  Laterality: Right;   COLONOSCOPY WITH PROPOFOL N/A 01/26/2019   Procedure: COLONOSCOPY WITH PROPOFOL;  Surgeon: Lin Landsman, MD;  Location: [facility] ENDOSCOPY;  Service: Gastroenterology;  Laterality: N/A;   FOOT SURGERY Bilateral    heel spurs   JOINT REPLACEMENT     TOTAL SHOULDER REPLACEMENT Right 01/25/2015   Dr. Roland Rack   TOTAL THYROIDECTOMY  08/01/2018    Family History  Problem Relation Age of Onset   Heart disease Mother    Heart disease Father    Kidney disease Sister    Hypertension Sister    Diabetes Sister  Hypertension Brother    CVA Brother    Breast cancer Maternal Aunt 41   Hypertension Sister    Kidney failure Sister    Ovarian cancer Neg Hx    Colon cancer Neg Hx     Social History   Socioeconomic History   Marital status: Legally Separated    Spouse name: Not on file   Number of children: 1   Years of education: Not on file   Highest education level: 12th grade  Occupational History   Occupation: Cytogeneticist  Tobacco Use   Smoking status: Never   Smokeless tobacco: Never  Vaping Use   Vaping Use: Never used  Substance and Sexual Activity   Alcohol use: No    Alcohol/week: 0.0 standard drinks   Drug use: No   Sexual activity: Not Currently    Birth control/protection: Post-menopausal  Other Topics Concern   Not on file  Social History Narrative   Separated in around 2009.   She was living with her daughter, however currently moved in with her sister and nephew , but is looking for a house.    Social Determinants of Health   Financial Resource Strain: Low Risk    Difficulty of Paying Living Expenses: Not hard at all  Food Insecurity: No Food Insecurity   Worried About Charity fundraiser in the Last Year: Never true   [place] in the Last Year: Never true  Transportation Needs: No Transportation Needs   Lack of Transportation (Medical): No   Lack of Transportation (Non-Medical):  No  Physical Activity: Inactive   Days of Exercise per Week: 0 days   Minutes of Exercise per Session: 0 min  Stress: No Stress Concern Present   Feeling of Stress : Only a little  Social Connections: Moderately Integrated   Frequency of Communication with Friends and Family: More than three times a week   Frequency of Social Gatherings with Friends and Family: More than three times a week   Attends Religious Services: 1 to 4 times per year   Active Member of [employer] or Organizations: Yes   Attends Archivist Meetings: 1 to 4 times per year   Marital Status: Separated  Intimate Partner Violence: Not At Risk   Fear of Current or Ex-Partner: No   Emotionally Abused: No   Physically Abused: No   Sexually Abused: No     Current Outpatient Medications:    Alirocumab (PRALUENT) 75 MG/ML SOAJ, Inject 75 mg into the skin every 14 (fourteen) days., Disp: 2 mL, Rfl: 5   aspirin 81 MG tablet, Take 81 mg by mouth daily., Disp: , Rfl:    Calcium Carb-Cholecalciferol (CALCIUM 500+D3 PO), Take 2 tablets by mouth daily., Disp: , Rfl:    Cholecalciferol (VITAMIN D3) 1000 units CAPS, Take 1,000 Units by mouth daily., Disp: , Rfl:    cyclobenzaprine (FLEXERIL) 10 MG tablet, Take 1 tablet (10 mg total) by mouth 3 (three) times daily as needed for muscle spasms., Disp: 30 tablet, Rfl: 0   dapagliflozin propanediol (FARXIGA) 10 MG TABS tablet, TAKE 1 TABLET BY MOUTH ONCE DAILY BEFORE BREAKFAST IN PLACE OF XIGDUO., Disp: 90 tablet, Rfl: 3   diclofenac Sodium (VOLTAREN) 1 % GEL, Apply 2 g topically 4 (four) times daily as needed., Disp: 100 g, Rfl: 2   exemestane (AROMASIN) 25 MG tablet, Take 1 tablet (25 mg total) by mouth daily., Disp: 30 tablet, Rfl: 4   ezetimibe (ZETIA) 10 MG tablet, Take  1 tablet (10 mg total) by mouth daily., Disp: 90 tablet, Rfl: 1   gabapentin (NEURONTIN) 100 MG capsule, Take 1 capsule by mouth 3 (three) times daily., Disp: , Rfl:    glucose blood test strip, FREESTYLE LITE  TEST (In Vitro Strip)  use as directed for 0 days  Quantity: 200.00;  Refills: 3   Ordered :25-Nov-2014  Vonna Kotyk ;  Started 30-Dec-2008 Active Comments: DX: 250.00, Disp: , Rfl:    ketoconazole (NIZORAL) 2 % cream, Apply 1 application topically daily., Disp: 60 g, Rfl: 0   levothyroxine (SYNTHROID) 125 MCG tablet, Take 1 tablet by mouth once daily, Disp: 90 tablet, Rfl: 0   methylPREDNISolone (MEDROL DOSEPAK) 4 MG TBPK tablet, As directed, Disp: 21 tablet, Rfl: 0   Olmesartan-amLODIPine-HCTZ 40-5-25 MG TABS, TAKE 1 TABLET DAILY (SCHEDULE APPOINTMENT FOR FURTHER REFILLS), Disp: 90 tablet, Rfl: 3   Semaglutide (RYBELSUS) 14 MG TABS, Take 14 mg by mouth daily., Disp: 90 tablet, Rfl: 1  Allergies  Allergen Reactions   Rosuvastatin Other (See Comments)    Myalgia      ROS  Constitutional: Negative for fever or weight change.  Respiratory: positive  for mild intermittent cough but no  shortness of breath.   Cardiovascular: Negative for chest pain or palpitations.  Gastrointestinal: Negative for abdominal pain, no bowel changes.  Musculoskeletal: Negative for gait problem or joint swelling.  Skin: Negative for rash.  Neurological: Negative for dizziness or headache.  No other specific complaints in a complete review of systems (except as listed in HPI above).   Objective  Vitals:   01/11/22 0928  BP: 136/74  Pulse: (!) 101  Resp: 16  Temp: 98.2 F (36.8 C)  TempSrc: Oral  SpO2: 97%  Weight: 217 lb 4.8 oz (98.6 kg)  Height: 5' 4" (1.626 m)    Body mass index is 37.3 kg/m.  Physical Exam  Constitutional: Patient appears well-developed and well-nourished. No distress.  HENT: Head: Normocephalic and atraumatic. Ears: B TMs ok, no erythema or effusion; Nose: Not done  Mouth/Throat: not done  Eyes: Conjunctivae and EOM are normal. Pupils are equal, round, and reactive to light. No scleral icterus.  Neck: Normal range of motion. Neck supple. No JVD present. No thyromegaly  present.  Cardiovascular: Normal rate, regular rhythm and normal heart sounds.  No murmur heard. No BLE edema. Pulmonary/Chest: Effort normal and breath sounds normal. No respiratory distress. Abdominal: Soft. Bowel sounds are normal, no distension. There is no tenderness. no masses Breast: no lumps or masses, no nipple discharge or rashes FEMALE GENITALIA:  Not done  RECTAL:not done  Musculoskeletal: Normal range of motion, no joint effusions. No gross deformities Neurological: he is alert and oriented to person, place, and time. No cranial nerve deficit. Coordination, balance, strength, speech and gait are normal.  Skin: Skin is warm and dry. No rash noted. No erythema.  Psychiatric: Patient has a normal mood and affect. behavior is normal. Judgment and thought content normal.   Recent Results (from the past 2160 hour(s))  Comprehensive metabolic panel     Status: Abnormal   Collection Time: 11/08/21 10:49 AM  Result Value Ref Range   Sodium 138 135 - 145 mmol/L   Potassium 3.7 3.5 - 5.1 mmol/L   Chloride 101 98 - 111 mmol/L   CO2 28 22 - 32 mmol/L   Glucose, Bld 97 70 - 99 mg/dL    Comment: Glucose reference range applies only to samples taken after fasting for at least 8  hours.   BUN 18 8 - 23 mg/dL   Creatinine, Ser 1.27 (H) 0.44 - 1.00 mg/dL   Calcium 9.2 8.9 - 10.3 mg/dL   Total Protein 7.2 6.5 - 8.1 g/dL   Albumin 3.9 3.5 - 5.0 g/dL   AST 17 15 - 41 U/L   ALT 19 0 - 44 U/L   Alkaline Phosphatase 37 (L) 38 - 126 U/L   Total Bilirubin 0.2 (L) 0.3 - 1.2 mg/dL   GFR, Estimated 47 (L) >60 mL/min    Comment: (NOTE) Calculated using the CKD-EPI Creatinine Equation (2021)    Anion gap 9 5 - 15    Comment: Performed at [facility], [address] [place], [place] [address]  CBC with Differential/Platelet     Status: Abnormal   Collection Time: 11/08/21 10:49 AM  Result Value Ref Range   WBC 3.3 (L) 4.0 - 10.5 K/uL   RBC 4.87 3.87 - 5.11 MIL/uL   Hemoglobin 13.4 12.0  - 15.0 g/dL   HCT 40.8 36.0 - 46.0 %   MCV 83.8 80.0 - 100.0 fL   MCH 27.5 26.0 - 34.0 pg   MCHC 32.8 30.0 - 36.0 g/dL   RDW 13.9 11.5 - 15.5 %   Platelets 307 150 - 400 K/uL   nRBC 0.0 0.0 - 0.2 %   Neutrophils Relative % 45 %   Neutro Abs 1.5 (L) 1.7 - 7.7 K/uL   Lymphocytes Relative 43 %   Lymphs Abs 1.4 0.7 - 4.0 K/uL   Monocytes Relative 9 %   Monocytes Absolute 0.3 0.1 - 1.0 K/uL   Eosinophils Relative 2 %   Eosinophils Absolute 0.1 0.0 - 0.5 K/uL   Basophils Relative 1 %   Basophils Absolute 0.0 0.0 - 0.1 K/uL   Immature Granulocytes 0 %   Abs Immature Granulocytes 0.01 0.00 - 0.07 K/uL    Comment: Performed at [facility], [address] [place], [place] [address]  POCT HgB A1C     Status: Abnormal   Collection Time: 12/06/21  8:11 AM  Result Value Ref Range   Hemoglobin A1C 6.4 (A) 4.0 - 5.6 %   HbA1c POC (<> result, manual entry)     HbA1c, POC (prediabetic range)     HbA1c, POC (controlled diabetic range)    Lipid panel     Status: Abnormal   Collection Time: 12/06/21  8:43 AM  Result Value Ref Range   Cholesterol 255 (H) <200 mg/dL   HDL 49 (L) > OR = 50 mg/dL   Triglycerides 65 <150 mg/dL   LDL Cholesterol (Calc) 189 (H) mg/dL (calc)    Comment: Reference range: <100 . Desirable range <100 mg/dL for primary prevention;   <70 mg/dL for patients with CHD or diabetic patients  with > or = 2 CHD risk factors. Marland Kitchen LDL-C is now calculated using the Martin-Hopkins  calculation, which is a validated novel method providing  better accuracy than the Friedewald equation in the  estimation of LDL-C.  Cresenciano Genre et al. Annamaria Helling. 0539;767(34): 2061-2068  (http://education.[facility].com/faq/FAQ164)    Total CHOL/HDL Ratio 5.2 (H) <5.0 (calc)   Non-HDL Cholesterol (Calc) 206 (H) <130 mg/dL (calc)    Comment: For patients with diabetes plus 1 major ASCVD risk  factor, treating to a non-HDL-C goal of <100 mg/dL  (LDL-C of <70 mg/dL) is considered a therapeutic   option.   Microalbumin / creatinine urine ratio     Status: None   Collection Time: 12/06/21  8:43 AM  Result Value Ref Range   Creatinine, Urine 109 20 - 275 mg/dL   Microalb, Ur 1.6 mg/dL    Comment: Reference Range Not established    Microalb Creat Ratio 15 <30 mcg/mg creat    Comment: . The ADA defines abnormalities in albumin excretion as follows: Marland Kitchen Albuminuria Category        Result (mcg/mg creatinine) . Normal to Mildly increased   <30 Moderately increased         30-299  Severely increased           > OR = 300 . The ADA recommends that at least two of three specimens collected within a 3-6 month period be abnormal before considering a patient to be within a diagnostic category.   COMPLETE METABOLIC PANEL WITH GFR     Status: Abnormal   Collection Time: 12/06/21  8:43 AM  Result Value Ref Range   Glucose, Bld 96 65 - 99 mg/dL    Comment: .            Fasting reference interval .    BUN 17 7 - 25 mg/dL   Creat 1.20 (H) 0.50 - 1.05 mg/dL   eGFR 51 (L) > OR = 60 mL/min/1.88m    Comment: The eGFR is based on the CKD-EPI 2021 equation. To calculate  the new eGFR from a previous Creatinine or Cystatin C result, go to https://www.kidney.org/professionals/ kdoqi/gfr%5Fcalculator    BUN/Creatinine Ratio 14 6 - 22 (calc)   Sodium 141 135 - 146 mmol/L   Potassium 4.4 3.5 - 5.3 mmol/L   Chloride 103 98 - 110 mmol/L   CO2 33 (H) 20 - 32 mmol/L   Calcium 9.8 8.6 - 10.4 mg/dL   Total Protein 7.3 6.1 - 8.1 g/dL   Albumin 4.2 3.6 - 5.1 g/dL   Globulin 3.1 1.9 - 3.7 g/dL (calc)   AG Ratio 1.4 1.0 - 2.5 (calc)   Total Bilirubin 0.4 0.2 - 1.2 mg/dL   Alkaline phosphatase (APISO) 40 37 - 153 U/L   AST 16 10 - 35 U/L   ALT 16 6 - 29 U/L  TSH     Status: None   Collection Time: 12/06/21  8:43 AM  Result Value Ref Range   TSH 1.14 0.40 - 4.50 mIU/L  VITAMIN D 25 Hydroxy (Vit-D Deficiency, Fractures)     Status: None   Collection Time: 12/06/21  8:43 AM  Result Value  Ref Range   Vit D, 25-Hydroxy 52 30 - 100 ng/mL    Comment: Vitamin D Status         25-OH Vitamin D: . Deficiency:                    <20 ng/mL Insufficiency:             20 - 29 ng/mL Optimal:                 > or = 30 ng/mL . For 25-OH Vitamin D testing on patients on  D2-supplementation and patients for whom quantitation  of D2 and D3 fractions is required, the [facility](TM) 25-OH VIT D, (D2,D3), LC/MS/MS is recommended: order  code [id number](patients >271yr. See Note 1 . Note 1 . For additional information, please refer to  http://education.[facility].com/faq/FAQ199  (This link is being provided for informational/ educational purposes only.)   Parathyroid hormone, intact (no Ca)     Status: None   Collection Time: 12/06/21  8:43 AM  Result Value Ref Range   PTH 43 16 - 77 pg/mL    Comment: . Interpretive Guide    Intact PTH           Calcium ------------------    ----------           ------- Normal Parathyroid    Normal               Normal Hypoparathyroidism    Low or Low Normal    Low Hyperparathyroidism    Primary            Normal or High       High    Secondary          High                 Normal or Low    Tertiary           High                 High Non-Parathyroid    Hypercalcemia      Low or Low Normal    High .      Fall Risk: Fall Risk  01/11/2022 12/19/2021 12/06/2021 09/05/2021 08/30/2021  Falls in the past year? 0 0 0 0 1  Number falls in past yr: 0 0 0 0 0  Injury with Fall? 0 0 0 0 0  Risk for fall due to : No Fall Risks No Fall Risks - - No Fall Risks  Follow up Falls prevention discussed Falls prevention discussed Falls evaluation completed Falls evaluation completed Falls prevention discussed     Functional Status Survey: Is the patient deaf or have difficulty hearing?: No Does the patient have difficulty seeing, even when wearing glasses/contacts?: No Does the patient have difficulty concentrating, remembering, or making decisions?:  No Does the patient have difficulty walking or climbing stairs?: No Does the patient have difficulty dressing or bathing?: No Does the patient have difficulty doing errands alone such as visiting a doctor's office or shopping?: No   Assessment & Plan  1. Well adult exam    -USPSTF grade A and B recommendations reviewed with patient; age-appropriate recommendations, preventive care, screening tests, etc discussed and encouraged; healthy living encouraged; see AVS for patient education given to patient -Discussed importance of 150 minutes of physical activity weekly, eat two servings of fish weekly, eat one serving of tree nuts ( cashews, pistachios, pecans, almonds.Marland Kitchen) every other day, eat 6 servings of fruit/vegetables daily and drink plenty of water and avoid sweet beverages.   -Reviewed Health Maintenance: Yes.

## 2022-01-11 ENCOUNTER — Ambulatory Visit (INDEPENDENT_AMBULATORY_CARE_PROVIDER_SITE_OTHER): Payer: BC Managed Care – PPO | Admitting: Family Medicine

## 2022-01-11 ENCOUNTER — Encounter: Payer: Self-pay | Admitting: Family Medicine

## 2022-01-11 VITALS — BP 136/74 | HR 101 | Temp 98.2°F | Resp 16 | Ht 64.0 in | Wt 217.3 lb

## 2022-01-11 DIAGNOSIS — Z1211 Encounter for screening for malignant neoplasm of colon: Secondary | ICD-10-CM

## 2022-01-11 DIAGNOSIS — Z0000 Encounter for general adult medical examination without abnormal findings: Secondary | ICD-10-CM | POA: Diagnosis not present

## 2022-01-11 NOTE — Patient Instructions (Signed)

## 2022-01-15 ENCOUNTER — Other Ambulatory Visit: Payer: Self-pay

## 2022-01-15 ENCOUNTER — Ambulatory Visit
Admission: RE | Admit: 2022-01-15 | Discharge: 2022-01-15 | Disposition: A | Discharge status: Home / Self Care | Payer: BC Managed Care – PPO | Source: Ambulatory Visit | Attending: Oncology | Admitting: Oncology

## 2022-01-15 ENCOUNTER — Ambulatory Visit
Admission: RE | Admit: 2022-01-15 | Discharge: 2022-01-15 | Disposition: A | Discharge status: Home / Self Care | Payer: BC Managed Care – PPO | Source: Ambulatory Visit | Attending: Orthopedic Surgery | Admitting: Orthopedic Surgery

## 2022-01-15 DIAGNOSIS — C50211 Malignant neoplasm of upper-inner quadrant of right female breast: Secondary | ICD-10-CM | POA: Insufficient documentation

## 2022-01-15 DIAGNOSIS — M545 Low back pain, unspecified: Secondary | ICD-10-CM | POA: Diagnosis not present

## 2022-01-15 DIAGNOSIS — M4316 Spondylolisthesis, lumbar region: Secondary | ICD-10-CM | POA: Diagnosis not present

## 2022-01-15 DIAGNOSIS — R922 Inconclusive mammogram: Secondary | ICD-10-CM | POA: Diagnosis not present

## 2022-01-15 DIAGNOSIS — M4726 Other spondylosis with radiculopathy, lumbar region: Secondary | ICD-10-CM | POA: Insufficient documentation

## 2022-01-15 DIAGNOSIS — Z170 Estrogen receptor positive status [ER+]: Secondary | ICD-10-CM | POA: Diagnosis not present

## 2022-01-15 DIAGNOSIS — Z853 Personal history of malignant neoplasm of breast: Secondary | ICD-10-CM | POA: Diagnosis not present

## 2022-01-15 HISTORY — DX: Personal history of irradiation: Z92.3

## 2022-01-23 DIAGNOSIS — C50911 Malignant neoplasm of unspecified site of right female breast: Secondary | ICD-10-CM | POA: Diagnosis not present

## 2022-01-26 DIAGNOSIS — M48062 Spinal stenosis, lumbar region with neurogenic claudication: Secondary | ICD-10-CM | POA: Diagnosis not present

## 2022-01-26 DIAGNOSIS — M5416 Radiculopathy, lumbar region: Secondary | ICD-10-CM | POA: Diagnosis not present

## 2022-02-06 ENCOUNTER — Telehealth: Payer: Self-pay

## 2022-02-06 NOTE — Telephone Encounter (Signed)
Patient has some questions about colonoscopy schedule for 02/09/22 ?

## 2022-02-06 NOTE — Telephone Encounter (Signed)
CALLED PATIENT AND ANSWERED ALL QUESTIONS  ?

## 2022-02-09 ENCOUNTER — Encounter: Payer: Self-pay | Admitting: Gastroenterology

## 2022-02-09 ENCOUNTER — Ambulatory Visit: Payer: BC Managed Care – PPO | Admitting: Anesthesiology

## 2022-02-09 ENCOUNTER — Encounter
Admission: RE | Disposition: A | Discharge status: Home / Self Care | Payer: Self-pay | Source: Home / Self Care | Attending: Gastroenterology

## 2022-02-09 ENCOUNTER — Ambulatory Visit
Admission: RE | Admit: 2022-02-09 | Discharge: 2022-02-09 | Disposition: A | Discharge status: Home / Self Care | Payer: BC Managed Care – PPO | Attending: Gastroenterology | Admitting: Gastroenterology

## 2022-02-09 DIAGNOSIS — G4733 Obstructive sleep apnea (adult) (pediatric): Secondary | ICD-10-CM | POA: Diagnosis not present

## 2022-02-09 DIAGNOSIS — E890 Postprocedural hypothyroidism: Secondary | ICD-10-CM | POA: Insufficient documentation

## 2022-02-09 DIAGNOSIS — D122 Benign neoplasm of ascending colon: Secondary | ICD-10-CM | POA: Diagnosis not present

## 2022-02-09 DIAGNOSIS — E119 Type 2 diabetes mellitus without complications: Secondary | ICD-10-CM | POA: Diagnosis not present

## 2022-02-09 DIAGNOSIS — E669 Obesity, unspecified: Secondary | ICD-10-CM | POA: Diagnosis not present

## 2022-02-09 DIAGNOSIS — Z79899 Other long term (current) drug therapy: Secondary | ICD-10-CM | POA: Insufficient documentation

## 2022-02-09 DIAGNOSIS — I10 Essential (primary) hypertension: Secondary | ICD-10-CM | POA: Insufficient documentation

## 2022-02-09 DIAGNOSIS — Z1211 Encounter for screening for malignant neoplasm of colon: Secondary | ICD-10-CM | POA: Insufficient documentation

## 2022-02-09 DIAGNOSIS — Z86010 Personal history of colonic polyps: Secondary | ICD-10-CM | POA: Diagnosis not present

## 2022-02-09 DIAGNOSIS — D120 Benign neoplasm of cecum: Secondary | ICD-10-CM | POA: Insufficient documentation

## 2022-02-09 DIAGNOSIS — K635 Polyp of colon: Secondary | ICD-10-CM | POA: Diagnosis not present

## 2022-02-09 DIAGNOSIS — Z7984 Long term (current) use of oral hypoglycemic drugs: Secondary | ICD-10-CM | POA: Diagnosis not present

## 2022-02-09 DIAGNOSIS — Z6837 Body mass index (BMI) 37.0-37.9, adult: Secondary | ICD-10-CM | POA: Diagnosis not present

## 2022-02-09 DIAGNOSIS — Z96611 Presence of right artificial shoulder joint: Secondary | ICD-10-CM | POA: Insufficient documentation

## 2022-02-09 DIAGNOSIS — E559 Vitamin D deficiency, unspecified: Secondary | ICD-10-CM | POA: Insufficient documentation

## 2022-02-09 HISTORY — DX: Malignant (primary) neoplasm, unspecified: C80.1

## 2022-02-09 HISTORY — PX: COLONOSCOPY WITH PROPOFOL: SHX5780

## 2022-02-09 LAB — GLUCOSE, CAPILLARY: Glucose-Capillary: 119 mg/dL — ABNORMAL HIGH (ref 70–99)

## 2022-02-09 SURGERY — COLONOSCOPY WITH PROPOFOL
Anesthesia: General

## 2022-02-09 MED ORDER — PHENYLEPHRINE HCL (PRESSORS) 10 MG/ML IV SOLN
INTRAVENOUS | Status: AC
  Filled 2022-02-09: qty 1

## 2022-02-09 MED ORDER — PROPOFOL 500 MG/50ML IV EMUL
INTRAVENOUS | Status: DC | PRN
  Administered 2022-02-09: 200 ug/kg/min via INTRAVENOUS

## 2022-02-09 MED ORDER — PROPOFOL 500 MG/50ML IV EMUL
INTRAVENOUS | Status: AC
  Filled 2022-02-09: qty 50

## 2022-02-09 MED ORDER — SODIUM CHLORIDE 0.9 % IV SOLN: INTRAVENOUS | Status: DC

## 2022-02-09 MED ORDER — PROPOFOL 10 MG/ML IV BOLUS
INTRAVENOUS | Status: DC | PRN
  Administered 2022-02-09: 90 mg via INTRAVENOUS

## 2022-02-09 NOTE — Op Note (Signed)
[facility] ?Gastroenterology ?Patient Name: Andrea Randolph ?Procedure Date: 02/09/2022 7:17 AM ?MRN: [id number] ?Account #: [id number] ?Date of Birth: 24-Jun-1956 ?Admit Type: Outpatient ?Age: 66 ?Room: [facility] ENDO ROOM 3 ?Gender: Female ?Note Status: Finalized ?Instrument Name: Colonoscope [id number] ?Procedure:             Colonoscopy ?Indications:           Surveillance: Personal history of colonic polyps  ?                       (unknown histology) on last colonoscopy 3 years ago ?Providers:             Jonathon Bellows MD, MD ?Referring MD:          Bethena Roys. Ancil Boozer, MD (Referring MD) ?Medicines:             Monitored Anesthesia Care ?Complications:         No immediate complications. ?Procedure:             Pre-Anesthesia Assessment: ?                       - Prior to the procedure, a History and Physical was  ?                       performed, and patient medications, allergies and  ?                       sensitivities were reviewed. The patient's tolerance  ?                       of previous anesthesia was reviewed. ?                       - The risks and benefits of the procedure and the  ?                       sedation options and risks were discussed with the  ?                       patient. All questions were answered and informed  ?                       consent was obtained. ?                       - ASA Grade Assessment: II - A patient with mild  ?                       systemic disease. ?                       After obtaining informed consent, the colonoscope was  ?                       passed under direct vision. Throughout the procedure,  ?                       the patient's blood pressure, pulse, and oxygen  ?  saturations were monitored continuously. The  ?                       Colonoscope was introduced through the anus and  ?                       advanced to the the cecum, identified by the  ?                       appendiceal orifice. The colonoscopy was  performed  ?                       with ease. The patient tolerated the procedure well.  ?                       The quality of the bowel preparation was excellent. ?Findings: ?     The perianal and digital rectal examinations were normal. ?     A 7 mm polyp was found in the cecum. The polyp was sessile. The polyp  ?     was removed with a cold snare. Resection and retrieval were complete. ?     Four sessile polyps were found in the ascending colon. The polyps were 5  ?     to 7 mm in size. These polyps were removed with a cold snare. Resection  ?     and retrieval were complete. ?     A 12 mm polyp was found in the ascending colon. The polyp was  ?     pedunculated. The polyp was removed with a hot snare. Resection and  ?     retrieval were complete. ?     The exam was otherwise without abnormality. ?Impression:            - One 7 mm polyp in the cecum, removed with a cold  ?                       snare. Resected and retrieved. ?                       - Four 5 to 7 mm polyps in the ascending colon,  ?                       removed with a cold snare. Resected and retrieved. ?                       - One 12 mm polyp in the ascending colon, removed with  ?                       a hot snare. Resected and retrieved. ?                       - The examination was otherwise normal. ?Recommendation:        - Discharge patient to home (with escort). ?                       - Resume previous diet. ?                       - Continue present medications. ?                       -  Await pathology results. ?                       - Repeat colonoscopy in 3 years for surveillance. ?Procedure Code(s):     --- Professional --- ?                       [phone], Colonoscopy, flexible; with removal of  ?                       tumor(s), polyp(s), or other lesion(s) by snare  ?                       technique ?Diagnosis Code(s):     --- Professional --- ?                       K63.5, Polyp of colon ?                       Z86.010, Personal  history of colonic polyps ?CPT copyright 2019 American Medical Association. All rights reserved. ?The codes documented in this report are preliminary and upon coder review may  ?be revised to meet current compliance requirements. ?Jonathon Bellows, MD ?Jonathon Bellows MD, MD ?02/09/2022 8:08:06 AM ?This report has been signed electronically. ?Number of Addenda: 0 ?Note Initiated On: 02/09/2022 7:17 AM ?Scope Withdrawal Time: 0 hours 12 minutes 54 seconds  ?Total Procedure Duration: 0 hours 15 minutes 52 seconds  ?Estimated Blood Loss:  Estimated blood loss: none. ?     [facility] ?

## 2022-02-09 NOTE — Anesthesia Postprocedure Evaluation (Signed)
Anesthesia Post Note ? ?Patient: Andrea Randolph ? ?Procedure(s) Performed: COLONOSCOPY WITH PROPOFOL ? ?Patient location during evaluation: Endoscopy ?Anesthesia Type: General ?Level of consciousness: awake and alert ?Pain management: pain level controlled ?Vital Signs Assessment: post-procedure vital signs reviewed and stable ?Respiratory status: spontaneous breathing, nonlabored ventilation and respiratory function stable ?Cardiovascular status: blood pressure returned to baseline and stable ?Postop Assessment: no apparent nausea or vomiting ?Anesthetic complications: no ? ? ?No notable events documented. ? ? ?Last Vitals:  ?Vitals:  ? 02/09/22 0808 02/09/22 0837  ?BP: (!) 90/46 (!) 156/81  ?Pulse:    ?Resp:    ?Temp: (!) 36 ?C   ?SpO2:    ?  ?Last Pain:  ?Vitals:  ? 02/09/22 0837  ?TempSrc:   ?PainSc: 0-No pain  ? ? ?  ?  ?  ?  ?  ?  ? ?[place] Ouch ? ? ? ? ?

## 2022-02-09 NOTE — Anesthesia Procedure Notes (Signed)
Date/Time: 02/09/2022 7:52 AM ?Performed by: Nelda Marseille, CRNA ?Pre-anesthesia Checklist: Patient identified, Emergency Drugs available, Suction available, Patient being monitored and Timeout performed ?Oxygen Delivery Method: Simple face mask ? ? ? ? ?

## 2022-02-09 NOTE — Anesthesia Preprocedure Evaluation (Addendum)
Anesthesia Evaluation  ?Patient identified by MRN, date of birth, ID band ?Patient awake ? ? ? ?Reviewed: ?Allergy & Precautions, H&P , NPO status , Patient's Chart, lab work & pertinent test results ? ?History of Anesthesia Complications ?Negative for: history of anesthetic complications ? ?Airway ?Mallampati: III ? ?TM Distance: >3 FB ?Neck ROM: Full ? ? ? Dental ?no notable dental hx. ?(+) Teeth Intact ?  ?Pulmonary ?sleep apnea and Continuous Positive Airway Pressure Ventilation , neg COPD, Patient abstained from smoking.Not current smoker,  ?  ?Pulmonary exam normal ?breath sounds clear to auscultation ? ? ? ? ? ? Cardiovascular ?Exercise Tolerance: Good ?METShypertension, Pt. on medications ?(-) CAD and (-) Past MI (-) dysrhythmias  ?Rhythm:regular Rate:Normal ?- Systolic murmurs ? ?  ?Neuro/Psych ?negative neurological ROS ? negative psych ROS  ? GI/Hepatic ?negative GI ROS, Neg liver ROS, neg GERD  ,  ?Endo/Other  ?diabetes, Oral Hypoglycemic AgentsHypothyroidism  ? Renal/GU ?CRFRenal disease  ?negative genitourinary ?  ?Musculoskeletal ? ? Abdominal ?(+) + obese,   ?Peds ? Hematology ?negative hematology ROS ?(+)   ?Anesthesia Other Findings ?Obesity ? ?Past Medical History: ?No date: Allergy ?No date: Anemia ?No date: Arthritis of right shoulder region ?    Comment:  Dr. Tamala Julian ?No date: Chronic insomnia ?No date: Diabetes mellitus without complication ([place]) ?No date: Goiter ?    Comment:  Dr. Gabriel Carina ?No date: Hyperlipidemia ?No date: Hypertension ?No date: Hypertensive retinopathy of left eye ?No date: Hypothyroidism, adult ?    Comment:  Dr. Gabriel Carina ?No date: Inflammation of urethra ?No date: Menopause ?No date: Microalbuminuria ?    Comment:  DM ?No date: Mixed incontinence ?No date: Muscle cramps ?No date: Obesity ?No date: OSA (obstructive sleep apnea) ?No date: Radiculitis, lumbosacral ?No date: Vitamin D deficiency ? ?Past Surgical History: ?90's: BREAST BIOPSY; Right ?     Comment:  benign core needle  ?No date: FOOT SURGERY ?01/25/2015: TOTAL SHOULDER REPLACEMENT; Right ?    Comment:  Dr. Roland Rack ?08/01/2018: TOTAL THYROIDECTOMY ? ?BMI   ? Body Mass Index:  37.44 kg/m?  ?  ? ? Reproductive/Obstetrics ?negative OB ROS ? ?  ? ? ? ? ? ? ? ? ? ? ? ? ? ?  ?  ? ? ? ? ? ? ? ?Anesthesia Physical ? ?Anesthesia Plan ? ?ASA: II ? ?Anesthesia Plan: General  ? ?Post-op Pain Management:   ? ?Induction: Intravenous ? ?PONV Risk Score and Plan: 3 and TIVA and Propofol infusion ? ?Airway Management Planned: Natural Airway ? ?Additional Equipment: None ? ?Intra-op Plan:  ? ?Post-operative Plan:  ? ?Informed Consent: I have reviewed the patients History and Physical, chart, labs and discussed the procedure including the risks, benefits and alternatives for the proposed anesthesia with the patient or authorized representative who has indicated his/her understanding and acceptance.  ? ? ? ?Dental Advisory Given ? ?Plan Discussed with: Anesthesiologist and CRNA ? ?Anesthesia Plan Comments:   ? ? ? ? ? ? ?Anesthesia Quick Evaluation ? ?

## 2022-02-09 NOTE — Transfer of Care (Signed)
Immediate Anesthesia Transfer of Care Note ? ?Patient: Andrea Randolph ? ?Procedure(s) Performed: COLONOSCOPY WITH PROPOFOL ? ?Patient Location: PACU ? ?Anesthesia Type:General ? ?Level of Consciousness: sedated ? ?Airway & Oxygen Therapy: Patient Spontanous Breathing and Patient connected to face mask oxygen ? ?Post-op Assessment: Report given to RN and Post -op Vital signs reviewed and stable ? ?Post vital signs: Reviewed and stable ? ?Last Vitals:  ?Vitals Value Taken Time  ?BP    ?Temp 36 ?C 02/09/22 0808  ?Pulse 96 02/09/22 0809  ?Resp 14 02/09/22 0809  ?SpO2 96 % 02/09/22 0809  ?Vitals shown include unvalidated device data. ? ?Last Pain:  ?Vitals:  ? 02/09/22 0808  ?TempSrc: Temporal  ?PainSc:   ?   ? ?  ? ?Complications: No notable events documented. ?

## 2022-02-09 NOTE — H&P (Signed)
?Jonathon Bellows, MD ?[address] [place], [place], [place], [address] ?[address] [place], [place], [place], [address] ?Phone: [phone]  ?Fax: [phone] ? ?Primary Care Physician:  Steele Sizer, MD ? ? ?Pre-Procedure History & Physical: ?HPI:  Andrea Randolph is a 66 y.o. female is here for an colonoscopy. ?  ?Past Medical History:  ?Diagnosis Date  ? Allergy   ? Anemia   ? Arthritis of right shoulder region   ? Dr. Tamala Julian  ? Cancer [facility])   ? breast cancer right side  ? Chronic insomnia   ? Diabetes mellitus without complication ([place])   ? Family history of adverse reaction to anesthesia   ? sister with Nausea  ? GERD (gastroesophageal reflux disease)   ? Goiter   ? Dr. Gabriel Carina  ? Heart murmur   ? Hyperlipidemia   ? Hypertension   ? Hypertensive retinopathy of left eye   ? Hypothyroidism, adult   ? Dr. Gabriel Carina  ? Inflammation of urethra   ? Menopause   ? Microalbuminuria   ? DM  ? Mixed incontinence   ? Muscle cramps   ? Obesity   ? OSA (obstructive sleep apnea)   ? Personal history of radiation therapy   ? Breast Cancer Right breast  ? Radiculitis, lumbosacral   ? Vitamin D deficiency   ? ? ?Past Surgical History:  ?Procedure Laterality Date  ? BREAST BIOPSY Left 90's  ? benign core needle   ? BREAST BIOPSY Right 02/07/2021  ? [place] Bx, Q-clip, path pending   ? BREAST LUMPECTOMY Right 03/06/2021  ? BREAST LUMPECTOMY WITH SENTINEL LYMPH NODE BIOPSY Right 03/06/2021  ? Procedure: BREAST LUMPECTOMY WITH SENTINEL LYMPH NODE BX;  Surgeon: Robert Bellow, MD;  Location: [facility] ORS;  Service: General;  Laterality: Right;  ? COLONOSCOPY WITH PROPOFOL N/A 01/26/2019  ? Procedure: COLONOSCOPY WITH PROPOFOL;  Surgeon: Lin Landsman, MD;  Location: [facility] ENDOSCOPY;  Service: Gastroenterology;  Laterality: N/A;  ? FOOT SURGERY Bilateral   ? heel spurs  ? JOINT REPLACEMENT    ? TOTAL SHOULDER REPLACEMENT Right 01/25/2015  ? Dr. Roland Rack  ? TOTAL THYROIDECTOMY  08/01/2018  ? ? ?Prior to Admission medications   ?Medication  Sig Start Date End Date Taking? Authorizing Provider  ?levothyroxine (SYNTHROID) 125 MCG tablet Take 1 tablet by mouth once daily 11/21/21  Yes Sowles, Drue Stager, MD  ?Olmesartan-amLODIPine-HCTZ 40-5-25 MG TABS TAKE 1 TABLET DAILY (SCHEDULE APPOINTMENT FOR FURTHER REFILLS) 07/10/21  Yes Gollan, Kathlene November, MD  ?Semaglutide (RYBELSUS) 14 MG TABS Take 14 mg by mouth daily. 08/30/21  Yes Sowles, Drue Stager, MD  ?Alirocumab (PRALUENT) 75 MG/ML SOAJ Inject 75 mg into the skin every 14 (fourteen) days. 12/14/21   Steele Sizer, MD  ?aspirin 81 MG tablet Take 81 mg by mouth daily. 12/27/10   [provider]  ?Calcium Carb-Cholecalciferol (CALCIUM 500+D3 PO) Take 2 tablets by mouth daily.    [provider]  ?Cholecalciferol (VITAMIN D3) 1000 units CAPS Take 1,000 Units by mouth daily.    [provider]  ?dapagliflozin propanediol (FARXIGA) 10 MG TABS tablet TAKE 1 TABLET BY MOUTH ONCE DAILY BEFORE BREAKFAST IN PLACE OF XIGDUO. 07/10/21   Minna Merritts, MD  ?diclofenac Sodium (VOLTAREN) 1 % GEL Apply 2 g topically 4 (four) times daily as needed. 05/29/21   Steele Sizer, MD  ?exemestane (AROMASIN) 25 MG tablet Take 1 tablet (25 mg total) by mouth daily. 11/08/21   Earlie Server, MD  ?ezetimibe (ZETIA) 10 MG  tablet Take 1 tablet (10 mg total) by mouth daily. 12/06/21   Steele Sizer, MD  ?gabapentin (NEURONTIN) 100 MG capsule Take 1 capsule by mouth 3 (three) times daily. 09/19/21 09/19/22  [provider]  ?glucose blood test strip FREESTYLE LITE TEST (In Vitro Strip) ? use as directed for 0 days ? Quantity: 200.00;  Refills: 3 ? ? Ordered :25-Nov-2014 ? Vonna Kotyk ;  Started 30-Dec-2008 ?Active Comments: DX: 250.00 12/30/08   [provider]  ?ketoconazole (NIZORAL) 2 % cream Apply 1 application topically daily. 06/26/18   Steele Sizer, MD  ? ? ?Allergies as of 12/06/2021 - Review Complete 12/06/2021  ?Allergen Reaction Noted  ? Rosuvastatin Other (See Comments) 10/20/2020   ? ? ?Family History  ?Problem Relation Age of Onset  ? Heart disease Mother   ? Heart disease Father   ? Kidney disease Sister   ? Hypertension Sister   ? Diabetes Sister   ? Hypertension Brother   ? CVA Brother   ? Breast cancer Maternal Aunt 74  ? Hypertension Sister   ? Kidney failure Sister   ? Ovarian cancer Neg Hx   ? Colon cancer Neg Hx   ? ? ?Social History  ? ?Socioeconomic History  ? Marital status: Legally Separated  ?  Spouse name: Not on file  ? Number of children: 1  ? Years of education: Not on file  ? Highest education level: 12th grade  ?Occupational History  ? Occupation: Cytogeneticist  ?Tobacco Use  ? Smoking status: Never  ? Smokeless tobacco: Never  ?Vaping Use  ? Vaping Use: Never used  ?Substance and Sexual Activity  ? Alcohol use: No  ?  Alcohol/week: 0.0 standard drinks  ? Drug use: No  ? Sexual activity: Not Currently  ?  Birth control/protection: Post-menopausal  ?Other Topics Concern  ? Not on file  ?Social History Narrative  ? Separated in around 2009.  ? She was living with her daughter, however currently moved in with her sister and nephew , but is looking for a house.   ? ?Social Determinants of Health  ? ?Financial Resource Strain: Low Risk   ? Difficulty of Paying Living Expenses: Not hard at all  ?Food Insecurity: No Food Insecurity  ? Worried About Charity fundraiser in the Last Year: Never true  ? Ran Out of Food in the Last Year: Never true  ?Transportation Needs: No Transportation Needs  ? Lack of Transportation (Medical): No  ? Lack of Transportation (Non-Medical): No  ?Physical Activity: Inactive  ? Days of Exercise per Week: 0 days  ? Minutes of Exercise per Session: 0 min  ?Stress: No Stress Concern Present  ? Feeling of Stress : Only a little  ?Social Connections: Moderately Integrated  ? Frequency of Communication with Friends and Family: More than three times a week  ? Frequency of Social Gatherings with Friends and Family: More than three times a week  ?  Attends Religious Services: 1 to 4 times per year  ? Active Member of Clubs or Organizations: Yes  ? Attends Archivist Meetings: 1 to 4 times per year  ? Marital Status: Separated  ?Intimate Partner Violence: Not At Risk  ? Fear of Current or Ex-Partner: No  ? Emotionally Abused: No  ? Physically Abused: No  ? Sexually Abused: No  ? ? ?Review of Systems: ?See HPI, otherwise negative ROS ? ?Physical Exam: ?BP (!) 161/92   Pulse 99   Temp (!) 97 ?  F (36.1 ?C) (Temporal)   Resp 18   Ht '5\' 4"'$  (3.149 m)   Wt 98.9 kg   SpO2 99%   BMI 37.42 kg/m?  ?General:   Alert,  pleasant and cooperative in NAD ?Head:  Normocephalic and atraumatic. ?Neck:  Supple; no masses or thyromegaly. ?Lungs:  Clear throughout to auscultation, normal respiratory effort.    ?Heart:  +S1, +S2, Regular rate and rhythm, No edema. ?Abdomen:  Soft, nontender and nondistended. Normal bowel sounds, without guarding, and without rebound.   ?Neurologic:  Alert and  oriented x4;  grossly normal neurologically. ? ?Impression/Plan: ?Andrea Randolph is here for an colonoscopy to be performed for surveillance due to prior history of colon polyps  ? ?Risks, benefits, limitations, and alternatives regarding  colonoscopy have been reviewed with the patient.  Questions have been answered.  All parties agreeable. ? ? ?Jonathon Bellows, MD  02/09/2022, 7:43 AM ? ?

## 2022-02-12 ENCOUNTER — Encounter: Payer: Self-pay | Admitting: Gastroenterology

## 2022-02-12 LAB — SURGICAL PATHOLOGY

## 2022-02-13 ENCOUNTER — Encounter: Payer: Self-pay | Admitting: Gastroenterology

## 2022-02-13 NOTE — Progress Notes (Signed)
Andrea Randolph inform patient polyps were adenomas - repeat colonoscopy in 3 years. Since has > 10 polyps refer for genetic testing

## 2022-02-14 ENCOUNTER — Telehealth: Payer: Self-pay

## 2022-02-14 DIAGNOSIS — Z86010 Personal history of colonic polyps: Secondary | ICD-10-CM

## 2022-02-14 NOTE — Telephone Encounter (Signed)
-----   Message from Jonathon Bellows, MD sent at 02/13/2022 10:07 AM EDT ----- ?Camora Tremain inform patient polyps were adenomas - repeat colonoscopy in 3 years. Since has > 10 polyps refer for genetic testing  ?

## 2022-02-14 NOTE — Telephone Encounter (Signed)
Called patient to let her know her colonoscopy results and that Dr.Anna is recommending for her to have a genetic testing done. Patient agreed. ?Patient will be in our recall list for her to have a repeat colonoscopy in 3 years. ?

## 2022-02-23 DIAGNOSIS — M5416 Radiculopathy, lumbar region: Secondary | ICD-10-CM | POA: Diagnosis not present

## 2022-02-23 DIAGNOSIS — M48062 Spinal stenosis, lumbar region with neurogenic claudication: Secondary | ICD-10-CM | POA: Diagnosis not present

## 2022-03-01 ENCOUNTER — Other Ambulatory Visit: Payer: Self-pay

## 2022-03-01 ENCOUNTER — Other Ambulatory Visit: Payer: Self-pay | Admitting: *Deleted

## 2022-03-01 DIAGNOSIS — I119 Hypertensive heart disease without heart failure: Secondary | ICD-10-CM

## 2022-03-01 MED ORDER — EXEMESTANE 25 MG PO TABS: 25.0000 mg | ORAL_TABLET | Freq: Every day | ORAL | 0 refills | Status: DC

## 2022-03-01 MED ORDER — OLMESARTAN-AMLODIPINE-HCTZ 40-5-25 MG PO TABS: ORAL_TABLET | ORAL | 0 refills | Status: DC

## 2022-03-06 NOTE — Progress Notes (Signed)
Name: Andrea Randolph   MRN: [id number]    DOB: 1956-07-27   Date:03/07/2022 ? ?     Progress Note ? ?Subjective ? ?Chief Complaint ? ?Follow Up ? ?HPI ? ?DMII: she is currently taking Farxiga and Rybelsus, off Metformin . Last A1C was 6.4 %  She has dyslipidemia, HTN , CKI and obesity. She is on ARB, and Zetia started on Praluent since Feb 2023   She denies polyphagia, polydipsia or polyuria. She states statin therapy caused myalgia  ? ?CKI stage III: it was 4 June 22 and it went down to 47 Dec 2022, she has good urine output, no pruritis we will recheck labs. She is on ARB and SGL-2 agonist  ? ?LVH and aortic stenosis: under care of Dr. Rockey Situ, no chest pain, palpitation or sob, she denies orthopnea She is on Farxiga also  ? ?Vitamin D low: she is now taking 2000 units daily, continue supplementation  ?  ?Surgical hypothyroidism: she has been taking levothyroxine 125 mcg  daily and last TSH was at goal, denies dry skin, no change in bowel movements.We will recheck level today  ? ?Morbid obesity: BMI above 35 with co-morbidities. She has DM, HTN, dyslipidemia and OA. She states not feeling well since she started on Arimdex, she continues to crave chocolate. Weight has been stable, unable to exercise due to joint pains  ?  ?Radicular lumbosacral pain: under the care of Dr. Phyllis Ginger , radiating down right leg. She had some steroid injections and it helped with the leg pain but back pain is worse. She is taking gabapentin  ? ?OA/Knee bilateral: saw Dr. Tamala Julian 09/22 and had steroid injection , pain is down to 1/10 on left knee, using Voltaren gel now since she got it from [facility]. ? ?Generalized arthralgias and myalgias: she noticed it after starting Arimidex earlier this year, she is now on Aromasin and also off statin therapy ( on hold per Dr. Rockey Situ ) pain is not as severe now but she is still stiff when she gets up in the mornings She had positive ANA, slightly elevated CK we referred her to Rheumatologist . Dr. Posey Pronto saw  her January 23 and thinks pain related to DDD lumbar spine and advised stretching exercises and PT, abnormal labs may be secondary to thyroid disease . He will re-evaluate her in 4 months. Advised her to keep follow up ?  ?Hyperlipidemia: she was Pravastatin  and Zetia . Recently off statin therapy due to muscle aches and elevated CK, last LDL over 180, now on zetia and praluent and we will recheck labs today  ?  ?OSA: she does not tolerate CPAP machine. Unchanged  ?  ?Breast Cancer right : diagnosed by abnormal mammogram back in March 22, lumpectomy was done 03/06/2021 followed by 5 weeks of radiation and finished 05/03/2021. She was doing well until she started on Arimidex, she states her entire body hurts, joint are stiff and aching, difficulty sleeping, change in bowel movement, increase in appetite. She is now off Arimidex and on Aromasin 25 mg still feels stiff and not able to go to the gym and gaining weight but side effects not as severe but also makes her crave chocolate . Discussed water activity again  ? ?Patient Active Problem List  ? Diagnosis Date Noted  ? Aromatase inhibitor use 05/30/2021  ? Complaints of total body pain 05/30/2021  ? Malignant 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, estrogen receptor positive ([place]) 02/20/2021  ? Thrombocytosis 11/01/2019  ?  Carpal tunnel syndrome, bilateral 02/19/2019  ? Trigger finger of right thumb 02/19/2019  ? Flexor tenosynovitis of finger 02/19/2019  ? Severe obesity (BMI 35.0-35.9 with comorbidity) ([place]) 12/29/2018  ? Mild concentric left ventricular hypertrophy (LVH) 12/24/2017  ? Mild aortic stenosis by prior echocardiogram 12/24/2017  ? Mild aortic regurgitation 12/24/2017  ? CKD (chronic kidney disease), stage I 06/22/2016  ? Uterine fibroid 01/11/2016  ? Allergic rhinitis, seasonal 05/24/2015  ? Benign essential HTN 05/24/2015  ? Chronic constipation 05/24/2015  ? Diabetes mellitus with renal manifestation ([place]) 05/24/2015  ?  Dyslipidemia 05/24/2015  ? Post-surgical hypothyroidism 05/24/2015  ? Mixed incontinence 05/24/2015  ? Microalbuminuria 05/24/2015  ? Menopause 05/24/2015  ? Obstructive apnea 05/24/2015  ? Morbid obesity ([place]) 05/24/2015  ? Hypertensive retinopathy 05/24/2015  ? Vitamin D deficiency 05/24/2015  ? Status post total replacement of right shoulder 03/11/2015  ? ? ?Past Surgical History:  ?Procedure Laterality Date  ? BREAST BIOPSY Left 90's  ? benign core needle   ? BREAST BIOPSY Right 02/07/2021  ? [place] Bx, Q-clip, path pending   ? BREAST LUMPECTOMY Right 03/06/2021  ? BREAST LUMPECTOMY WITH SENTINEL LYMPH NODE BIOPSY Right 03/06/2021  ? Procedure: BREAST LUMPECTOMY WITH SENTINEL LYMPH NODE BX;  Surgeon: Robert Bellow, MD;  Location: [facility] ORS;  Service: General;  Laterality: Right;  ? COLONOSCOPY WITH PROPOFOL N/A 01/26/2019  ? Procedure: COLONOSCOPY WITH PROPOFOL;  Surgeon: Lin Landsman, MD;  Location: [facility] ENDOSCOPY;  Service: Gastroenterology;  Laterality: N/A;  ? COLONOSCOPY WITH PROPOFOL N/A 02/09/2022  ? Procedure: COLONOSCOPY WITH PROPOFOL;  Surgeon: Jonathon Bellows, MD;  Location: [facility] ENDOSCOPY;  Service: Gastroenterology;  Laterality: N/A;  ? FOOT SURGERY Bilateral   ? heel spurs  ? JOINT REPLACEMENT    ? TOTAL SHOULDER REPLACEMENT Right 01/25/2015  ? Dr. Roland Rack  ? TOTAL THYROIDECTOMY  08/01/2018  ? ? ?Family History  ?Problem Relation Age of Onset  ? Heart disease Mother   ? Heart disease Father   ? Kidney disease Sister   ? Hypertension Sister   ? Diabetes Sister   ? Hypertension Brother   ? CVA Brother   ? Breast cancer Maternal Aunt 73  ? Hypertension Sister   ? Kidney failure Sister   ? Ovarian cancer Neg Hx   ? Colon cancer Neg Hx   ? ? ?Social History  ? ?Tobacco Use  ? Smoking status: Never  ? Smokeless tobacco: Never  ?Substance Use Topics  ? Alcohol use: No  ?  Alcohol/week: 0.0 standard drinks  ? ? ? ?Current Outpatient Medications:  ?  Alirocumab (PRALUENT) 75 MG/ML SOAJ, Inject 75 mg into  the skin every 14 (fourteen) days., Disp: 2 mL, Rfl: 5 ?  aspirin 81 MG tablet, Take 81 mg by mouth daily., Disp: , Rfl:  ?  Calcium Carb-Cholecalciferol (CALCIUM 500+D3 PO), Take 2 tablets by mouth daily., Disp: , Rfl:  ?  Cholecalciferol (VITAMIN D3) 1000 units CAPS, Take 1,000 Units by mouth daily., Disp: , Rfl:  ?  dapagliflozin propanediol (FARXIGA) 10 MG TABS tablet, TAKE 1 TABLET BY MOUTH ONCE DAILY BEFORE BREAKFAST IN PLACE OF XIGDUO., Disp: 90 tablet, Rfl: 3 ?  diclofenac Sodium (VOLTAREN) 1 % GEL, Apply 2 g topically 4 (four) times daily as needed., Disp: 100 g, Rfl: 2 ?  exemestane (AROMASIN) 25 MG tablet, Take 1 tablet (25 mg total) by mouth daily., Disp: 90 tablet, Rfl: 0 ?  ezetimibe (ZETIA) 10 MG tablet, Take 1 tablet (10 mg total)  by mouth daily., Disp: 90 tablet, Rfl: 1 ?  gabapentin (NEURONTIN) 100 MG capsule, Take 1 capsule by mouth 3 (three) times daily., Disp: , Rfl:  ?  glucose blood test strip, FREESTYLE LITE TEST (In Vitro Strip)  use as directed for 0 days  Quantity: 200.00;  Refills: 3   Ordered :25-Nov-2014  Vonna Kotyk ;  Started 30-Dec-2008 Active Comments: DX: 250.00, Disp: , Rfl:  ?  ketoconazole (NIZORAL) 2 % cream, Apply 1 application topically daily., Disp: 60 g, Rfl: 0 ?  levothyroxine (SYNTHROID) 125 MCG tablet, Take 1 tablet by mouth once daily, Disp: 90 tablet, Rfl: 0 ?  Olmesartan-amLODIPine-HCTZ 40-5-25 MG TABS, TAKE 1 TABLET DAILY (SCHEDULE APPOINTMENT FOR FURTHER REFILLS), Disp: 90 tablet, Rfl: 0 ?  Semaglutide (RYBELSUS) 14 MG TABS, Take 14 mg by mouth daily., Disp: 90 tablet, Rfl: 1 ? ?Allergies  ?Allergen Reactions  ? Rosuvastatin Other (See Comments)  ?  Myalgia   ? ? ?I personally reviewed active problem list, medication list, allergies, family history, social history, health maintenance with the patient/caregiver today. ? ? ?ROS ? ?Constitutional: Negative for fever or weight change.  ?Respiratory: Negative for cough and shortness of breath.   ?Cardiovascular:  Negative for chest pain or palpitations.  ?Gastrointestinal: Negative for abdominal pain, no bowel changes.  ?Musculoskeletal: Negative for gait problem or joint swelling.  ?Skin: Negative for rash.  ?Neurologica

## 2022-03-07 ENCOUNTER — Encounter: Payer: Self-pay | Admitting: Oncology

## 2022-03-07 ENCOUNTER — Inpatient Hospital Stay: Payer: BC Managed Care – PPO | Admitting: Occupational Therapy

## 2022-03-07 ENCOUNTER — Inpatient Hospital Stay: Payer: BC Managed Care – PPO | Admitting: Oncology

## 2022-03-07 ENCOUNTER — Encounter: Payer: Self-pay | Admitting: Family Medicine

## 2022-03-07 ENCOUNTER — Inpatient Hospital Stay: Payer: BC Managed Care – PPO | Attending: Oncology

## 2022-03-07 ENCOUNTER — Ambulatory Visit: Payer: BC Managed Care – PPO | Admitting: Family Medicine

## 2022-03-07 VITALS — BP 126/84 | HR 92 | Temp 98.5°F | Resp 16 | Ht 64.0 in | Wt 225.2 lb

## 2022-03-07 VITALS — BP 162/86 | HR 61 | Temp 96.9°F | Resp 18 | Wt 224.0 lb

## 2022-03-07 DIAGNOSIS — E1169 Type 2 diabetes mellitus with other specified complication: Secondary | ICD-10-CM

## 2022-03-07 DIAGNOSIS — N1832 Chronic kidney disease, stage 3b: Secondary | ICD-10-CM | POA: Diagnosis not present

## 2022-03-07 DIAGNOSIS — E1122 Type 2 diabetes mellitus with diabetic chronic kidney disease: Secondary | ICD-10-CM | POA: Diagnosis not present

## 2022-03-07 DIAGNOSIS — I119 Hypertensive heart disease without heart failure: Secondary | ICD-10-CM

## 2022-03-07 DIAGNOSIS — E890 Postprocedural hypothyroidism: Secondary | ICD-10-CM

## 2022-03-07 DIAGNOSIS — Z7982 Long term (current) use of aspirin: Secondary | ICD-10-CM | POA: Diagnosis not present

## 2022-03-07 DIAGNOSIS — E785 Hyperlipidemia, unspecified: Secondary | ICD-10-CM

## 2022-03-07 DIAGNOSIS — Z7984 Long term (current) use of oral hypoglycemic drugs: Secondary | ICD-10-CM | POA: Insufficient documentation

## 2022-03-07 DIAGNOSIS — Z923 Personal history of irradiation: Secondary | ICD-10-CM | POA: Diagnosis not present

## 2022-03-07 DIAGNOSIS — Z79899 Other long term (current) drug therapy: Secondary | ICD-10-CM | POA: Insufficient documentation

## 2022-03-07 DIAGNOSIS — Z170 Estrogen receptor positive status [ER+]: Secondary | ICD-10-CM

## 2022-03-07 DIAGNOSIS — N1831 Chronic kidney disease, stage 3a: Secondary | ICD-10-CM | POA: Insufficient documentation

## 2022-03-07 DIAGNOSIS — C50211 Malignant neoplasm of upper-inner quadrant of right female breast: Secondary | ICD-10-CM

## 2022-03-07 DIAGNOSIS — E559 Vitamin D deficiency, unspecified: Secondary | ICD-10-CM

## 2022-03-07 DIAGNOSIS — Z79811 Long term (current) use of aromatase inhibitors: Secondary | ICD-10-CM | POA: Insufficient documentation

## 2022-03-07 DIAGNOSIS — I10 Essential (primary) hypertension: Secondary | ICD-10-CM

## 2022-03-07 DIAGNOSIS — I890 Lymphedema, not elsewhere classified: Secondary | ICD-10-CM

## 2022-03-07 DIAGNOSIS — G720 Drug-induced myopathy: Secondary | ICD-10-CM

## 2022-03-07 DIAGNOSIS — E669 Obesity, unspecified: Secondary | ICD-10-CM

## 2022-03-07 DIAGNOSIS — Z803 Family history of malignant neoplasm of breast: Secondary | ICD-10-CM | POA: Diagnosis not present

## 2022-03-07 DIAGNOSIS — T466X5A Adverse effect of antihyperlipidemic and antiarteriosclerotic drugs, initial encounter: Secondary | ICD-10-CM

## 2022-03-07 DIAGNOSIS — I517 Cardiomegaly: Secondary | ICD-10-CM

## 2022-03-07 DIAGNOSIS — E782 Mixed hyperlipidemia: Secondary | ICD-10-CM

## 2022-03-07 LAB — CBC WITH DIFFERENTIAL/PLATELET
Abs Immature Granulocytes: 0.03 10*3/uL (ref 0.00–0.07)
Basophils Absolute: 0 10*3/uL (ref 0.0–0.1)
Basophils Relative: 1 %
Eosinophils Absolute: 0.1 10*3/uL (ref 0.0–0.5)
Eosinophils Relative: 3 %
HCT: 39.6 % (ref 36.0–46.0)
Hemoglobin: 12.8 g/dL (ref 12.0–15.0)
Immature Granulocytes: 1 %
Lymphocytes Relative: 33 %
Lymphs Abs: 1.3 10*3/uL (ref 0.7–4.0)
MCH: 27.3 pg (ref 26.0–34.0)
MCHC: 32.3 g/dL (ref 30.0–36.0)
MCV: 84.4 fL (ref 80.0–100.0)
Monocytes Absolute: 0.3 10*3/uL (ref 0.1–1.0)
Monocytes Relative: 7 %
Neutro Abs: 2.2 10*3/uL (ref 1.7–7.7)
Neutrophils Relative %: 55 %
Platelets: 323 10*3/uL (ref 150–400)
RBC: 4.69 MIL/uL (ref 3.87–5.11)
RDW: 14.4 % (ref 11.5–15.5)
WBC: 4 10*3/uL (ref 4.0–10.5)
nRBC: 0 % (ref 0.0–0.2)

## 2022-03-07 LAB — COMPREHENSIVE METABOLIC PANEL
ALT: 15 U/L (ref 0–44)
AST: 18 U/L (ref 15–41)
Albumin: 3.7 g/dL (ref 3.5–5.0)
Alkaline Phosphatase: 37 U/L — ABNORMAL LOW (ref 38–126)
Anion gap: 8 (ref 5–15)
BUN: 17 mg/dL (ref 8–23)
CO2: 26 mmol/L (ref 22–32)
Calcium: 9 mg/dL (ref 8.9–10.3)
Chloride: 105 mmol/L (ref 98–111)
Creatinine, Ser: 1.04 mg/dL — ABNORMAL HIGH (ref 0.44–1.00)
GFR, Estimated: >60 mL/min (ref >60)
Glucose, Bld: 97 mg/dL (ref 70–99)
Potassium: 4.1 mmol/L (ref 3.5–5.1)
Sodium: 139 mmol/L (ref 135–145)
Total Bilirubin: 0.6 mg/dL (ref 0.3–1.2)
Total Protein: 7 g/dL (ref 6.5–8.1)

## 2022-03-07 MED ORDER — DAPAGLIFLOZIN PROPANEDIOL 10 MG PO TABS: 10.0000 mg | ORAL_TABLET | Freq: Every day | ORAL | 1 refills | Status: DC

## 2022-03-07 MED ORDER — OLMESARTAN-AMLODIPINE-HCTZ 40-5-25 MG PO TABS: 1.0000 | ORAL_TABLET | Freq: Every day | ORAL | 1 refills | Status: DC

## 2022-03-07 MED ORDER — EZETIMIBE 10 MG PO TABS: 10.0000 mg | ORAL_TABLET | Freq: Every day | ORAL | 1 refills | Status: DC

## 2022-03-07 MED ORDER — LEVOTHYROXINE SODIUM 125 MCG PO TABS: 125.0000 ug | ORAL_TABLET | Freq: Every day | ORAL | 1 refills | Status: DC

## 2022-03-07 MED ORDER — RYBELSUS 14 MG PO TABS: 14.0000 mg | ORAL_TABLET | Freq: Every day | ORAL | 1 refills | Status: DC

## 2022-03-07 NOTE — Progress Notes (Signed)
Patient here for follow up. No new concerns voiced.  °

## 2022-03-07 NOTE — Progress Notes (Signed)
?Hematology/Oncology Progress note ?[phone] [phone] ?  ? ? ? ?Patient Care Team: ?Steele Sizer, MD as PCP - General (Family Medicine) ?Minna Merritts, MD as PCP - Cardiology (Cardiology) ?Rico Junker, RN as Oncology Nurse Navigator ?Earlie Server, MD as Consulting Physician (Oncology) ?Robert Bellow, MD as Consulting Physician (General Surgery) ?Steele Sizer, MD as Attending Physician (Family Medicine) ?Quintin Alto, MD as Consulting Physician (Rheumatology) ?Noreene Filbert, MD as Consulting Physician (Radiation Oncology) ?Urbano Heir as Physician Assistant (Orthopedic Surgery) ? ?CHIEF COMPLAINTS/REASON FOR VISIT:  ?Follow up of breast cancer.  ? ?HISTORY OF PRESENTING ILLNESS:  ? ?Andrea Randolph is a  66 y.o.  female with PMH listed below was seen in consultation at the request of  No ref. provider found  for evaluation of breast cancer.  ? ?01/12/21 screening mammogram showed focal asymmetry with possible distortion.  ?01/26/21 right diagnostic mammogram and US showed 3x5x44m hypoechoic mass, s/p biopsy on 02/07/21 ?Pathology showed invasive mammary carcinoma, grade 2, ER90% PR 90%, HER2 negative.  ? ?Patient was seen by Dr.Byrnett yesterday. She presents to establish care with oncology ?Accompanied by her daughter ? ?Menarche 15 years with ?Age at first childbirth 89  ?OCP use for about 20 years ?Denies any estrogen replacement. ?LMP in 557s ?Denies any previous chest radiation. ?History of left breast biopsy previously. ?Family history Father for maternal aunt who was diagnosed of breast cancer at age of 511 ? ?03/06/2021 lumpectomy and sentinel lymph node biopsy. ?Pathology showed invasive mammary carcinoma, no special type, DCIS in situ, intermediate grade.  All 3 sentinel lymph nodes were negative.  1 nonsentinel lymph node was negative.  Margins for invasive carcinoma was negative.  Margins for DCIS was unifocal positive. ?Patient declined  reexcision.Patient's case was discussed on breast cancer tumor board on 03/20/2021. ? ?# 05/03/2021 finished adjuvant radiation.  ?05/09/2021, patient started on Arimidex.  Patient called to report that she has experienced significant leg pain, back pain, neck pain shortly after starting Arimidex. ? ?left knee pain and swelling.  06/20/2021, left knee x-ray showed diffuse joint space narrowing of the left knee with spurring at the lateral compartment.  Small joint effusion. ? ?07/20/2021 patient was seen by orthopedic surgeon for left knee arthritis.  Patient received intra-articular corticosteroid injection and left knee pain has improved. ? ?INTERVAL HISTORY ?Andrea CERVENYis a 66y.o. female who has above history reviewed by me today presents for follow up visit for management of breast cancer ?Patient continues to have chronic arthralgia.  She takes Aromasin daily ?And if her joint pain is severe, she takes Aromasin every other day, usually for a week until her joint pain is better and that she resumes on daily Aromasin. ?Otherwise no new complaints. ? ? ?Review of Systems  ?Constitutional:  Negative for appetite change, chills, fatigue and fever.  ?HENT:   Negative for hearing loss and voice change.   ?Eyes:  Negative for eye problems.  ?Respiratory:  Negative for chest tightness and cough.   ?Cardiovascular:  Negative for chest pain.  ?Gastrointestinal:  Negative for abdominal distention, abdominal pain and blood in stool.  ?Endocrine: Negative for hot flashes.  ?Genitourinary:  Negative for difficulty urinating and frequency.   ?Musculoskeletal:  Positive for arthralgias.  ?Skin:  Negative for itching and rash.  ?Neurological:  Negative for extremity weakness.  ?Hematological:  Negative for adenopathy.  ?Psychiatric/Behavioral:  Negative for confusion. The patient is not nervous/anxious.   ? ?MEDICAL HISTORY:  ?Past  Medical History:  ?Diagnosis Date  ? Allergy   ? Anemia   ? Arthritis of right shoulder region   ? Dr.  Tamala Julian  ? Cancer [facility])   ? breast cancer right side  ? Chronic insomnia   ? Diabetes mellitus without complication ([place])   ? Family history of adverse reaction to anesthesia   ? sister with Nausea  ? GERD (gastroesophageal reflux disease)   ? Goiter   ? Dr. Gabriel Carina  ? Heart murmur   ? Hyperlipidemia   ? Hypertension   ? Hypertensive retinopathy of left eye   ? Hypothyroidism, adult   ? Dr. Gabriel Carina  ? Inflammation of urethra   ? Menopause   ? Microalbuminuria   ? DM  ? Mixed incontinence   ? Muscle cramps   ? Obesity   ? OSA (obstructive sleep apnea)   ? Personal history of radiation therapy   ? Breast Cancer Right breast  ? Radiculitis, lumbosacral   ? Vitamin D deficiency   ? ? ?SURGICAL HISTORY: ?Past Surgical History:  ?Procedure Laterality Date  ? BREAST BIOPSY Left 90's  ? benign core needle   ? BREAST BIOPSY Right 02/07/2021  ? [place] Bx, Q-clip, path pending   ? BREAST LUMPECTOMY Right 03/06/2021  ? BREAST LUMPECTOMY WITH SENTINEL LYMPH NODE BIOPSY Right 03/06/2021  ? Procedure: BREAST LUMPECTOMY WITH SENTINEL LYMPH NODE BX;  Surgeon: Robert Bellow, MD;  Location: [facility] ORS;  Service: General;  Laterality: Right;  ? COLONOSCOPY WITH PROPOFOL N/A 01/26/2019  ? Procedure: COLONOSCOPY WITH PROPOFOL;  Surgeon: Lin Landsman, MD;  Location: [facility] ENDOSCOPY;  Service: Gastroenterology;  Laterality: N/A;  ? COLONOSCOPY WITH PROPOFOL N/A 02/09/2022  ? Procedure: COLONOSCOPY WITH PROPOFOL;  Surgeon: Jonathon Bellows, MD;  Location: [facility] ENDOSCOPY;  Service: Gastroenterology;  Laterality: N/A;  ? FOOT SURGERY Bilateral   ? heel spurs  ? JOINT REPLACEMENT    ? TOTAL SHOULDER REPLACEMENT Right 01/25/2015  ? Dr. Roland Rack  ? TOTAL THYROIDECTOMY  08/01/2018  ? ? ?SOCIAL HISTORY: ?Social History  ? ?Socioeconomic History  ? Marital status: Legally Separated  ?  Spouse name: Not on file  ? Number of children: 1  ? Years of education: Not on file  ? Highest education level: 12th grade  ?Occupational History  ? Occupation: Dance movement psychotherapist  ?Tobacco Use  ? Smoking status: Never  ? Smokeless tobacco: Never  ?Vaping Use  ? Vaping Use: Never used  ?Substance and Sexual Activity  ? Alcohol use: No  ?  Alcohol/week: 0.0 standard drinks  ? Drug use: No  ? Sexual activity: Not Currently  ?  Birth control/protection: Post-menopausal  ?Other Topics Concern  ? Not on file  ?Social History Narrative  ? Separated in around 2009.  ? She was living with her daughter, however currently moved in with her sister and nephew , but is looking for a house.   ? ?Social Determinants of Health  ? ?Financial Resource Strain: Low Risk   ? Difficulty of Paying Living Expenses: Not hard at all  ?Food Insecurity: No Food Insecurity  ? Worried About Charity fundraiser in the Last Year: Never true  ? Ran Out of Food in the Last Year: Never true  ?Transportation Needs: No Transportation Needs  ? Lack of Transportation (Medical): No  ? Lack of Transportation (Non-Medical): No  ?Physical Activity: Inactive  ? Days of Exercise per Week: 0 days  ? Minutes of Exercise per Session: 0 min  ?Stress:  No Stress Concern Present  ? Feeling of Stress : Only a little  ?Social Connections: Moderately Integrated  ? Frequency of Communication with Friends and Family: More than three times a week  ? Frequency of Social Gatherings with Friends and Family: More than three times a week  ? Attends Religious Services: 1 to 4 times per year  ? Active Member of Clubs or Organizations: Yes  ? Attends Archivist Meetings: 1 to 4 times per year  ? Marital Status: Separated  ?Intimate Partner Violence: Not At Risk  ? Fear of Current or Ex-Partner: No  ? Emotionally Abused: No  ? Physically Abused: No  ? Sexually Abused: No  ? ? ?FAMILY HISTORY: ?Family History  ?Problem Relation Age of Onset  ? Heart disease Mother   ? Heart disease Father   ? Kidney disease Sister   ? Hypertension Sister   ? Diabetes Sister   ? Hypertension Brother   ? CVA Brother   ? Breast cancer Maternal Aunt 20  ?  Hypertension Sister   ? Kidney failure Sister   ? Ovarian cancer Neg Hx   ? Colon cancer Neg Hx   ? ? ?ALLERGIES:  is allergic to rosuvastatin. ? ?MEDICATIONS:  ?Current Outpatient Medications  ?Medication Sig Dispense Re

## 2022-03-07 NOTE — Therapy (Signed)
[place] [facility] ?[address], [address] ?[place], [place], [address] ?Phone: [phone]   Fax:  [phone] ? ?Occupational Therapy Screen: ? ?Patient Details  ?Name: Andrea Randolph ?MRN: [id number] ?Date of Birth: 01-28-1956 ?No data recorded ? ?Encounter Date: 03/07/2022 ? ? OT End of Session - 03/07/22 1355   ? ? Visit Number 0   ? ?  ?  ? ?  ? ? ?Past Medical History:  ?Diagnosis Date  ? Allergy   ? Anemia   ? Arthritis of right shoulder region   ? Dr. Tamala Julian  ? Cancer [facility])   ? breast cancer right side  ? Chronic insomnia   ? Diabetes mellitus without complication ([place])   ? Family history of adverse reaction to anesthesia   ? sister with Nausea  ? GERD (gastroesophageal reflux disease)   ? Goiter   ? Dr. Gabriel Carina  ? Heart murmur   ? Hyperlipidemia   ? Hypertension   ? Hypertensive retinopathy of left eye   ? Hypothyroidism, adult   ? Dr. Gabriel Carina  ? Inflammation of urethra   ? Menopause   ? Microalbuminuria   ? DM  ? Mixed incontinence   ? Muscle cramps   ? Obesity   ? OSA (obstructive sleep apnea)   ? Personal history of radiation therapy   ? Breast Cancer Right breast  ? Radiculitis, lumbosacral   ? Vitamin D deficiency   ? ? ?Past Surgical History:  ?Procedure Laterality Date  ? BREAST BIOPSY Left 90's  ? benign core needle   ? BREAST BIOPSY Right 02/07/2021  ? [place] Bx, Q-clip, path pending   ? BREAST LUMPECTOMY Right 03/06/2021  ? BREAST LUMPECTOMY WITH SENTINEL LYMPH NODE BIOPSY Right 03/06/2021  ? Procedure: BREAST LUMPECTOMY WITH SENTINEL LYMPH NODE BX;  Surgeon: Robert Bellow, MD;  Location: [facility] ORS;  Service: General;  Laterality: Right;  ? COLONOSCOPY WITH PROPOFOL N/A 01/26/2019  ? Procedure: COLONOSCOPY WITH PROPOFOL;  Surgeon: Lin Landsman, MD;  Location: [facility] ENDOSCOPY;  Service: Gastroenterology;  Laterality: N/A;  ? COLONOSCOPY WITH PROPOFOL N/A 02/09/2022  ? Procedure: COLONOSCOPY WITH PROPOFOL;  Surgeon: Jonathon Bellows, MD;  Location: [facility] ENDOSCOPY;   Service: Gastroenterology;  Laterality: N/A;  ? FOOT SURGERY Bilateral   ? heel spurs  ? JOINT REPLACEMENT    ? TOTAL SHOULDER REPLACEMENT Right 01/25/2015  ? Dr. Roland Rack  ? TOTAL THYROIDECTOMY  08/01/2018  ? ? ?There were no vitals filed for this visit. ? ? Subjective Assessment - 03/07/22 1353   ? ? Subjective  After you talked at the survivorship meeting, and I told you about my arm that is when I started noticing the swelling.  But is not going down is just getting worse.  I work in [employer] fund[employer] 8 hours a day 5 days a week.  I started back at work 3 weeks after my surgery during radiation.  I just stays tired all the time.   ? ?  ?  ? ?  ? ? ? ? ? ? LYMPHEDEMA/ONCOLOGY QUESTIONNAIRE - 03/07/22 0001   ? ?  ? Right Upper Extremity Lymphedema  ? 15 cm Proximal to Olecranon Process 42.5 cm   ? 10 cm Proximal to Olecranon Process 40.4 cm   ? Olecranon Process 30 cm   ? 15 cm Proximal to Ulnar Styloid Process 31 cm   ? 10 cm Proximal to Ulnar Styloid Process 27 cm   ? Just Proximal to Ulnar  Styloid Process 21 cm   ? Across Hand at [employer] 22 cm   ?  ? Left Upper Extremity Lymphedema  ? 15 cm Proximal to Olecranon Process 41 cm   ? 10 cm Proximal to Olecranon Process 38 cm   ? Olecranon Process 29 cm   ? 15 cm Proximal to Ulnar Styloid Process 29.4 cm   ? 10 cm Proximal to Ulnar Styloid Process 25 cm   ? Just Proximal to Ulnar Styloid Process 19.6 cm   ? Across Hand at [employer] 21 cm   ? ?  ?  ? ?  ? ? ? ? ?DR YU 03/07/22: ?ASSESSMENT & PLAN:  ?1. Malignant 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, estrogen receptor positive ([place])   ?2. Aromatase inhibitor use   ?3. Stage 3b chronic kidney disease ([place])   ? Cancer Staging  ?Malignant 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, estrogen receptor positive ([place]) ?Staging form: Breast, AJCC 8th Edition ?- Pathologic stage from 03/20/2021: Stage IA (pT1a, pN0, cM0, G2, ER+, PR+, HER2-) - Signed by Earlie Server, MD on 03/20/2021 ?  ?   ?Right breast invasive carcinoma.  pT1a pN0, ER positive, PR positive, HER2 negative. ?Focal DCIS margin positive.Patient declined reexcision.s/p adjuvant radiation.  ?Unable to tolerate Arimidex  switched to Aromasin. ?Labs reviewed and discussed with patient ?Continue Aromasin 47m daily.  ?  ?Recommend calcium 1200 mg daily.  She has a normal baseline bone density. ?01/15/2022  bilateral diagnostic mammogram results were reviewed and discussed with patient.  Continue annual mammogram. ?  ?Chronic kidney disease, stage III.   Avoid nephrotoxin.  Encourage hydration. ? ? OT SCREEN 03/07/22: ?Patient present for rehab screen today after she heard this OT talked at the survivorship meeting about lymphedema signs and symptoms as well as prevention. ?Patient asked to be scheduled for a rehab screen for lymphedema for right upper extremity and thoracic this date.  Patient had right breast cancer in 2022. ?Had lumpectomy and radiation.  Patient reports she had 5-6 lymph nodes removed in axilla and return back to work 3 weeks after surgery.  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 ?Patient is right-handed reports she works 5 days a week do not exercise at the moment.  Complains of severe fatigue since radiation and going back to work. ?Did encourage patient to start exercising daily even if it is 20 to 24 minutes a day.  Half of it can be walking other half can be some functional strengthening exercises.  Showed patient a few.  Also encouraged her to take advantage of family members pool that will be great for her to help with joint pain and back providing resistance and taking the pressure of the joints. ?Patient present with the right breast being larger and at times heavier per patient.  She reported right upper extremity larger in size does not decrease any more, actually getting larger.  Upon circumference measurement by OT right upper extremity was increased by 2.4 and upper arm, 1 cm at elbow, 2 cm in  forearm, 1.4 cm at the wrist.  Patient is right-hand dominant. ?Patient had in the past in 2016 total right shoulder surgery. ?Patient do appear to have stage II lymphedema in right upper extremity and thoracic discussed case with Dr. YTasia Catchingsthat ordered OT evaluation and treat.  Patient scheduled for next Thursday for evaluation with this OT.  In the meantime patient was fitted  with a Isotoner glove on the right hand and a Tubigrip D from hand to elbow and Tubigrip E from mid forearm to upper arm.  Patient to wear most all the time can remove it for 2 to 3 hours in the morning as well as in the evening. ?Provided handout again on lymphedema signs and symptoms as well as prevention and treatment. ?Patient in agreement will follow-up next week with evaluation with this OT. ? ? ? ? ? ? ? ? ? ? ? ? ? ? ? ? ? ? ? ? ? ? ? ? ?Patient will benefit from skilled therapeutic intervention in order to improve the following deficits and impairments:   ?  ?  ?  ? ? ?Visit Diagnosis: ?Lymphedema, not elsewhere classified ? ? ? ?Problem List ?Patient Active Problem List  ? Diagnosis Date Noted  ? Stage 3b chronic kidney disease ([place]) 03/07/2022  ? Aromatase inhibitor use 05/30/2021  ? Complaints of total body pain 05/30/2021  ? Malignant 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, estrogen receptor positive ([place]) 02/20/2021  ? Thrombocytosis 11/01/2019  ? Carpal tunnel syndrome, bilateral 02/19/2019  ? Trigger finger of right thumb 02/19/2019  ? Flexor tenosynovitis of finger 02/19/2019  ? Severe obesity (BMI 35.0-35.9 with comorbidity) ([place]) 12/29/2018  ? Mild concentric left ventricular hypertrophy (LVH) 12/24/2017  ? Mild aortic stenosis by prior echocardiogram 12/24/2017  ? Mild aortic regurgitation 12/24/2017  ? CKD (chronic kidney disease), stage I 06/22/2016  ? Uterine fibroid 01/11/2016  ? Allergic rhinitis, seasonal 05/24/2015  ? Benign essential HTN 05/24/2015  ? Chronic constipation 05/24/2015  ? Diabetes  mellitus with renal manifestation ([place]) 05/24/2015  ? Dyslipidemia 05/24/2015  ? Post-surgical hypothyroidism 05/24/2015  ? Mixed incontinence 05/24/2015  ? Microalbuminuria 05/24/2015  ? Menopause 05/24/2015  ?

## 2022-03-08 LAB — COMPLETE METABOLIC PANEL WITH GFR
AG Ratio: 1.4 (calc) (ref 1.0–2.5)
ALT: 11 U/L (ref 6–29)
AST: 14 U/L (ref 10–35)
Albumin: 3.9 g/dL (ref 3.6–5.1)
Alkaline phosphatase (APISO): 38 U/L (ref 37–153)
BUN/Creatinine Ratio: 15 (calc) (ref 6–22)
BUN: 17 mg/dL (ref 7–25)
CO2: 27 mmol/L (ref 20–32)
Calcium: 9.3 mg/dL (ref 8.6–10.4)
Chloride: 107 mmol/L (ref 98–110)
Creat: 1.11 mg/dL — ABNORMAL HIGH (ref 0.50–1.05)
Globulin: 2.7 g/dL (calc) (ref 1.9–3.7)
Glucose, Bld: 95 mg/dL (ref 65–99)
Potassium: 4.4 mmol/L (ref 3.5–5.3)
Sodium: 143 mmol/L (ref 135–146)
Total Bilirubin: 0.4 mg/dL (ref 0.2–1.2)
Total Protein: 6.6 g/dL (ref 6.1–8.1)
eGFR: 56 mL/min/{1.73_m2} — ABNORMAL LOW (ref >=60)

## 2022-03-08 LAB — LIPID PANEL
Cholesterol: 158 mg/dL (ref <200)
HDL: 47 mg/dL — ABNORMAL LOW (ref >=50)
LDL Cholesterol (Calc): 97 mg/dL (calc)
Non-HDL Cholesterol (Calc): 111 mg/dL (calc) (ref <130)
Total CHOL/HDL Ratio: 3.4 (calc) (ref <5.0)
Triglycerides: 62 mg/dL (ref <150)

## 2022-03-08 LAB — HEMOGLOBIN A1C
Hgb A1c MFr Bld: 6.7 % of total Hgb — ABNORMAL HIGH (ref <5.7)
Mean Plasma Glucose: 146 mg/dL
eAG (mmol/L): 8.1 mmol/L

## 2022-03-08 LAB — TSH: TSH: 0.34 mIU/L — ABNORMAL LOW (ref 0.40–4.50)

## 2022-03-12 ENCOUNTER — Other Ambulatory Visit: Payer: Self-pay | Admitting: Family Medicine

## 2022-03-12 DIAGNOSIS — E890 Postprocedural hypothyroidism: Secondary | ICD-10-CM

## 2022-03-13 ENCOUNTER — Inpatient Hospital Stay: Payer: BC Managed Care – PPO | Attending: Oncology | Admitting: Licensed Clinical Social Worker

## 2022-03-13 ENCOUNTER — Inpatient Hospital Stay: Payer: BC Managed Care – PPO

## 2022-03-13 ENCOUNTER — Encounter: Payer: Self-pay | Admitting: Licensed Clinical Social Worker

## 2022-03-13 DIAGNOSIS — C50211 Malignant neoplasm of upper-inner quadrant of right female breast: Secondary | ICD-10-CM | POA: Diagnosis not present

## 2022-03-13 DIAGNOSIS — Z803 Family history of malignant neoplasm of breast: Secondary | ICD-10-CM | POA: Diagnosis not present

## 2022-03-13 DIAGNOSIS — Z86010 Personal history of colonic polyps: Secondary | ICD-10-CM

## 2022-03-13 NOTE — Progress Notes (Signed)
REFERRING PROVIDER: ?Jonathon Bellows, MD ?[address] ?[address] ?[place],  [place] [address] ? ?PRIMARY PROVIDER:  ?Randolph Sizer, MD ? ?PRIMARY REASON FOR VISIT:  ?1. Malignant 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, estrogen receptor positive ([place])   ?2. Personal history of colonic polyps   ?3. Family history of breast cancer   ? ? ? ?HISTORY OF PRESENT ILLNESS:   ?Andrea Randolph, a 66 y.o. female, was seen for a [place] cancer genetics consultation at the request of Dr. Vicente Males due to a personal history of colon polyps.  Andrea Randolph presents to clinic today to discuss the possibility of a hereditary predisposition to cancer, genetic testing, and to further clarify her future cancer risks, as well as potential cancer risks for family members.  ? ?In 2022, at the age of 37, Andrea Randolph was diagnosed with invasive mammary carcinoma of the right breast, ER/PR+ HER2-. The treatment plan included lumpectomy, adjuvant radiation and adjuvant antiestrogen therapy.  ? ?In 2020, Andrea Randolph had a colonoscopy that revealed 7 polyps, tubular adenomas and a tubulovillous adenoma. ?In 2023, Andrea Randolph had a colonoscopy that revealed 6 polyps, tubular adenomas. ? ? ?CANCER HISTORY:  ?Oncology History  ?Malignant 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, estrogen receptor positive ([place])  ?02/20/2021 Initial Diagnosis  ? Malignant 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, estrogen receptor positive ([place]) ? ?  ?03/20/2021 Cancer Staging  ? Staging form: Breast, AJCC 8th Edition ?- Pathologic stage from 03/20/2021: Stage IA (pT1a, pN0, cM0, G2, ER+, PR+, HER2-) - Signed by Earlie Server, MD on 03/20/2021 ?Stage prefix: Initial diagnosis ?Nuclear grade: G2 ?Multigene prognostic tests performed: None ?Histologic grading system: 3 grade system ? ?  ? ? ? ?RISK FACTORS:  ?Menarche was at age 71.  ?First live birth at age 30.  ?OCP use for approximately  20  years.  ?Ovaries intact: yes.  ?Hysterectomy:  no.  ?Menopausal status: postmenopausal.  ?HRT use: 0 years. ?Colonoscopy: yes;  13 cumulative polyps . ?Mammogram within the last year: yes. ?Number of breast biopsies: 1. ?Up to date with pelvic exams: yes. ? ?Past Medical History:  ?Diagnosis Date  ? Allergy   ? Anemia   ? Arthritis of right shoulder region   ? Dr. Tamala Julian  ? Cancer [facility])   ? breast cancer right side  ? Chronic insomnia   ? Diabetes mellitus without complication ([place])   ? Family history of adverse reaction to anesthesia   ? sister with Nausea  ? GERD (gastroesophageal reflux disease)   ? Goiter   ? Dr. Gabriel Carina  ? Heart murmur   ? Hyperlipidemia   ? Hypertension   ? Hypertensive retinopathy of left eye   ? Hypothyroidism, adult   ? Dr. Gabriel Carina  ? Inflammation of urethra   ? Menopause   ? Microalbuminuria   ? DM  ? Mixed incontinence   ? Muscle cramps   ? Obesity   ? OSA (obstructive sleep apnea)   ? Personal history of radiation therapy   ? Breast Cancer Right breast  ? Radiculitis, lumbosacral   ? Vitamin D deficiency   ? ? ?Past Surgical History:  ?Procedure Laterality Date  ? BREAST BIOPSY Left 90's  ? benign core needle   ? BREAST BIOPSY Right 02/07/2021  ? [place] Bx, Q-clip, path pending   ? BREAST LUMPECTOMY Right 03/06/2021  ? BREAST LUMPECTOMY WITH SENTINEL LYMPH NODE BIOPSY Right 03/06/2021  ? Procedure: BREAST  LUMPECTOMY WITH SENTINEL LYMPH NODE BX;  Surgeon: Byrnett, Jeffrey W, MD;  Location: [facility] ORS;  Service: General;  Laterality: Right;  ? COLONOSCOPY WITH PROPOFOL N/A 01/26/2019  ? Procedure: COLONOSCOPY WITH PROPOFOL;  Surgeon: Vanga, Rohini Reddy, MD;  Location: [facility] ENDOSCOPY;  Service: Gastroenterology;  Laterality: N/A;  ? COLONOSCOPY WITH PROPOFOL N/A 02/09/2022  ? Procedure: COLONOSCOPY WITH PROPOFOL;  Surgeon: Anna, Kiran, MD;  Location: [facility] ENDOSCOPY;  Service: Gastroenterology;  Laterality: N/A;  ? FOOT SURGERY Bilateral   ? heel spurs  ? JOINT REPLACEMENT    ? TOTAL SHOULDER REPLACEMENT Right 01/25/2015  ? Dr. Poggi  ? TOTAL  THYROIDECTOMY  08/01/2018  ? ? ?Social History  ? ?Socioeconomic History  ? Marital status: Legally Separated  ?  Spouse name: Not on file  ? Number of children: 1  ? Years of education: Not on file  ? Highest education level: 12th grade  ?Occupational History  ? Occupation: assistant supervisor  ?Tobacco Use  ? Smoking status: Never  ? Smokeless tobacco: Never  ?Vaping Use  ? Vaping Use: Never used  ?Substance and Sexual Activity  ? Alcohol use: No  ?  Alcohol/week: 0.0 standard drinks  ? Drug use: No  ? Sexual activity: Not Currently  ?  Birth control/protection: Post-menopausal  ?Other Topics Concern  ? Not on file  ?Social History Narrative  ? Separated in around 2009.  ? She was living with her daughter, however currently moved in with her sister and nephew , but is looking for a house.   ? ?Social Determinants of Health  ? ?Financial Resource Strain: Low Risk   ? Difficulty of Paying Living Expenses: Not hard at all  ?Food Insecurity: No Food Insecurity  ? Worried About Running Out of Food in the Last Year: Never true  ? Ran Out of Food in the Last Year: Never true  ?Transportation Needs: No Transportation Needs  ? Lack of Transportation (Medical): No  ? Lack of Transportation (Non-Medical): No  ?Physical Activity: Inactive  ? Days of Exercise per Week: 0 days  ? Minutes of Exercise per Session: 0 min  ?Stress: No Stress Concern Present  ? Feeling of Stress : Only a little  ?Social Connections: Moderately Integrated  ? Frequency of Communication with Friends and Family: More than three times a week  ? Frequency of Social Gatherings with Friends and Family: More than three times a week  ? Attends Religious Services: 1 to 4 times per year  ? Active Member of Clubs or Organizations: Yes  ? Attends Club or Organization Meetings: 1 to 4 times per year  ? Marital Status: Separated  ?  ? ?FAMILY HISTORY:  ?We obtained a detailed, 4-generation family history.  Significant diagnoses are listed below: ?Family History   ?Problem Relation Age of Onset  ? Heart disease Mother   ? Heart disease Father   ? Kidney disease Sister   ? Hypertension Sister   ? Diabetes Sister   ? Hypertension Brother   ? CVA Brother   ? Breast cancer Maternal Aunt 50  ? Hypertension Sister   ? Kidney failure Sister   ? Ovarian cancer Neg Hx   ? Colon cancer Neg Hx   ? ?Andrea Randolph has 1 daughter, 48. She has 2 sisters, 2 brothers. One brother had throat cancer. None of her siblings have had colon polyps that she is aware of. Her daughter has not yet had a colonoscopy but is in process of scheduling one.  ? ?  Andrea Randolph mother died at 17 of heart attack. Patient had 3 maternal aunts, 1 uncle. An aunt had breast cancer and passed at 72. No known cancers in cousins. No known info about maternal grandparents. ? ?Andrea Randolph's father died at 5 of stroke/heart attack. Patient had 1 paternal uncle, no cancers. No information about paternal grandparents. ? ?Andrea Randolph is unaware of previous family history of genetic testing for hereditary cancer risks. There is no reported Ashkenazi Jewish ancestry. There is no known consanguinity. ? ?GENETIC COUNSELING ASSESSMENT: Andrea Randolph is a 66 y.o. female with a personal history of breast cancer and personal history of colon polyps which is somewhat suggestive of a hereditary cancer syndrome and predisposition to cancer. We, therefore, discussed and recommended the following at today's visit.  ? ?DISCUSSION: We discussed that polyps in general are common, however, most people have fewer than 5 lifetime polyps.  When an individual has 10 or more polyps we become concerned about an underlying polyposis syndrome.  The most common hereditary polyposis syndromes are caused by problems in the APC and MUTYH genes. We also discussed that approximately 10% of cancer is hereditary. Most cases of hereditary breast cancer are associated with BRCA1/BRCA2 genes, although there are other genes associated with hereditary cancer as well. Cancers  and risks are gene specific. We discussed that testing is beneficial for several reasons including knowing about cancer risks, identifying potential screening and risk-reduction options that may be appropria

## 2022-03-15 ENCOUNTER — Encounter: Payer: Self-pay | Admitting: Occupational Therapy

## 2022-03-15 ENCOUNTER — Ambulatory Visit: Payer: BC Managed Care – PPO | Attending: Oncology | Admitting: Occupational Therapy

## 2022-03-15 DIAGNOSIS — I890 Lymphedema, not elsewhere classified: Secondary | ICD-10-CM | POA: Insufficient documentation

## 2022-03-15 NOTE — Therapy (Signed)
[facility] ?[address] ?[place], [place], [address] ?Phone: [phone]   Fax:  [phone] ? ?Occupational Therapy Evaluation ? ?Patient Details  ?Name: Andrea Randolph ?MRN: [id number] ?Date of Birth: 07/19/1956 ?Referring Provider (OT): Dr. Tasia Catchings ? ? ?Encounter Date: 03/15/2022 ? ? OT End of Session - 03/15/22 1703   ? ? Visit Number 1   ? Number of Visits 6   ? Date for OT Re-Evaluation 04/26/22   ? OT Start Time 9983   ? OT Stop Time 1700   ? OT Time Calculation (min) 43 min   ? Activity Tolerance Patient tolerated treatment well   ? Behavior During Therapy [facility] for tasks assessed/performed   ? ?  ?  ? ?  ? ? ?Past Medical History:  ?Diagnosis Date  ? Allergy   ? Anemia   ? Arthritis of right shoulder region   ? Dr. Tamala Julian  ? Cancer [facility])   ? breast cancer right side  ? Chronic insomnia   ? Diabetes mellitus without complication ([place])   ? Family history of adverse reaction to anesthesia   ? sister with Nausea  ? GERD (gastroesophageal reflux disease)   ? Goiter   ? Dr. Gabriel Carina  ? Heart murmur   ? Hyperlipidemia   ? Hypertension   ? Hypertensive retinopathy of left eye   ? Hypothyroidism, adult   ? Dr. Gabriel Carina  ? Inflammation of urethra   ? Menopause   ? Microalbuminuria   ? DM  ? Mixed incontinence   ? Muscle cramps   ? Obesity   ? OSA (obstructive sleep apnea)   ? Personal history of radiation therapy   ? Breast Cancer Right breast  ? Radiculitis, lumbosacral   ? Vitamin D deficiency   ? ? ?Past Surgical History:  ?Procedure Laterality Date  ? BREAST BIOPSY Left 90's  ? benign core needle   ? BREAST BIOPSY Right 02/07/2021  ? [place] Bx, Q-clip, path pending   ? BREAST LUMPECTOMY Right 03/06/2021  ? BREAST LUMPECTOMY WITH SENTINEL LYMPH NODE BIOPSY Right 03/06/2021  ? Procedure: BREAST LUMPECTOMY WITH SENTINEL LYMPH NODE BX;  Surgeon: Robert Bellow, MD;  Location: [facility] ORS;  Service: General;  Laterality: Right;  ? COLONOSCOPY WITH PROPOFOL N/A 01/26/2019  ?  Procedure: COLONOSCOPY WITH PROPOFOL;  Surgeon: Lin Landsman, MD;  Location: [facility] ENDOSCOPY;  Service: Gastroenterology;  Laterality: N/A;  ? COLONOSCOPY WITH PROPOFOL N/A 02/09/2022  ? Procedure: COLONOSCOPY WITH PROPOFOL;  Surgeon: Jonathon Bellows, MD;  Location: [facility] ENDOSCOPY;  Service: Gastroenterology;  Laterality: N/A;  ? FOOT SURGERY Bilateral   ? heel spurs  ? JOINT REPLACEMENT    ? TOTAL SHOULDER REPLACEMENT Right 01/25/2015  ? Dr. Roland Rack  ? TOTAL THYROIDECTOMY  08/01/2018  ? ? ?There were no vitals filed for this visit. ? ? Subjective Assessment - 03/15/22 1657   ? ? Subjective  After you talked at the survivorship meeting, and I told you about my arm that is when I started noticing the swelling.  But is not going down is just getting worse.  I work in [employer] 8 hours a day 5 days a week.  I started back at work 3 weeks after my surgery during radiation.  I just stays tired all the time.   ? Pertinent History Dr Tasia Catchings - 03/07/22 ASSESSMENT & PLAN:   1. Malignant 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, estrogen receptor positive ([place])  2. Aromatase inhibitor use   3. Stage 3b chronic kidney disease ([place])    Cancer Staging   Malignant 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, estrogen receptor positive ([place])  Staging form: Breast, AJCC 8th Edition  - Pathologic stage from 03/20/2021: Stage IA (pT1a, pN0, cM0, G2, ER+, PR+, HER2-) - Signed by Earlie Server, MD on 03/20/2021        Right breast invasive carcinoma.  pT1a pN0, ER positive, PR positive, HER2 negative.  Focal DCIS margin positive.Patient declined reexcision.s/p adjuvant radiation.   Unable to tolerate Arimidex  switched to Aromasin.  Labs reviewed and discussed with patient  Continue Aromasin 23m daily.   01/15/2022  bilateral diagnostic mammogram results were reviewed and discussed with patient.  Continue annual mammogram.  Bone health, 05/16/2021, normal bone density.  Repeat in 2 years.  Recommend calcium 1200 mg  daily.  She has a normal baseline bone density.        Chronic kidney disease, stage III.  Avoid nephrotoxin encourage hydration.   -refer to OT for R UE and thoracic lymphedema   ? Patient Stated Goals Do not want my arm to get bigger or heavier.   ? Currently in Pain? No/denies   ? ?  ?  ? ?  ? ? ? ? OPerdidoOT Assessment - 03/15/22 0001   ? ?  ? Assessment  ? Medical Diagnosis Right upper extremity and thoracic lymphedema   ? Referring Provider (OT) Dr. YTasia Catchings  ? Onset Date/Surgical Date 02/12/22   ? Hand Dominance Right   ? Next MD Visit 6 months   ?  ? Home  Environment  ? Lives With Alone   ?  ? Prior Function  ? Vocation Full time employment   ? Leisure Works full-time in a tEngineer, agriculturalrepetitively.  Around the house and some light in the flowers stays tired   ? ?  ?  ? ?  ? ? ? LYMPHEDEMA/ONCOLOGY QUESTIONNAIRE - 03/15/22 0001   ? ?  ? Right Upper Extremity Lymphedema  ? 15 cm Proximal to Olecranon Process 41 cm   ? 10 cm Proximal to Olecranon Process 39.5 cm   ? Olecranon Process 30 cm   ? 15 cm Proximal to Ulnar Styloid Process 30 cm   ? 10 cm Proximal to Ulnar Styloid Process 27 cm   ? Just Proximal to Ulnar Styloid Process 20.4 cm   ? Across Hand at [employer]21.5 cm   ? [facility].5 [facility]   ? [facility]   ?  ? Left Upper Extremity Lymphedema  ? 15 cm Proximal to Olecranon Process 41 cm   ? 10 cm Proximal to Olecranon Process 38 cm   ? Olecranon Process 29 cm   ? 15 cm Proximal to Ulnar Styloid Process 29.4 cm   ? 10 cm Proximal to Ulnar Styloid Process 25 cm   ? Just Proximal to Ulnar Styloid Process 19.6 cm   ? Across Hand at [employer]21 cm   ? ?  ?  ? ?  ? ? ? ? ? ? ?Patient do appear to have stage II lymphedema in right upper extremity and thoracic discussed case with Dr. YTasia Catchingslast week at Screen - and  ordered OT evaluation and treat.  Patient Isotoner glove on the right hand and a Tubigrip D from hand to elbow and Tubigrip E from mid forearm  to upper arm since then  .Provided handout again on lymphedema signs and symptoms as well as prevention and treatment. ?This date patient return after wearing compression since screening last week patient show some decrease measurements patient fitted again with Isotoner glove with Tubigrip D" Tubigrip E-2 sets to go back and forth between work and nighttime patient can keep it off for 2 hours in the evenings and mornings ?We will reassess in a week again if she continues to decongest with just Tubigrip and do not may be need decongesting bandaging. ? ? ? ? ? ? ? ? ? ? OT Education - 03/15/22 1703   ? ? Education Details Findings evaluation and home program   ? Person(s) Educated Patient   ? Methods Explanation;Demonstration;Tactile cues;Verbal cues;Handout   ? Comprehension Verbal cues required;Returned demonstration;Verbalized understanding   ? ?  ?  ? ?  ? ? ? ? ? ? OT Long Term Goals - 03/15/22 1705   ? ?  ? OT LONG TERM GOAL #1  ? Title Patient to be independent in home program to decrease circumference in the right upper extremity to be fitted for compression garments.   ? Baseline Patient has no knowledge of home program.  Upper arm increased 2.4 cm elbow 1 cm, forearm 2 cm and wrist 1.4 cm compared to the left side   ? Time 4   ? Period Weeks   ? Status New   ? Target Date 04/12/22   ?  ? OT LONG TERM GOAL #2  ? Title Assess patient for thoracic lymphedema and breast to assess if patient need home program for manual massage or Jovi pack breast pad.   ? Baseline Patient made a mention in survivorship meeting and during screening that breast is sometimes harder or fuller.   ? Time 4   ? Period Weeks   ? Status New   ? Target Date 04/12/22   ?  ? OT LONG TERM GOAL #3  ? Title Patient's right upper extremity decreased in circumference for patient to be fitted with a compression daytimes sleeve and if needed at nighttime to maintain right upper extremity circumference.   ? Baseline Patient with no compression report increase size of  right arm and breast at times per patient right swelling stays in arm.  Upper arm increased 2.4 cm, elbow 1 cm, forearm 2 cm and wrist 1.4 cm   ? Time 6   ? Period Weeks   ? Status New   ? Target Date 06/15/2

## 2022-03-19 ENCOUNTER — Other Ambulatory Visit: Payer: Self-pay | Admitting: Family Medicine

## 2022-03-19 DIAGNOSIS — E785 Hyperlipidemia, unspecified: Secondary | ICD-10-CM

## 2022-03-19 DIAGNOSIS — E1169 Type 2 diabetes mellitus with other specified complication: Secondary | ICD-10-CM

## 2022-03-20 DIAGNOSIS — R768 Other specified abnormal immunological findings in serum: Secondary | ICD-10-CM | POA: Diagnosis not present

## 2022-03-20 DIAGNOSIS — M5136 Other intervertebral disc degeneration, lumbar region: Secondary | ICD-10-CM | POA: Diagnosis not present

## 2022-03-22 ENCOUNTER — Ambulatory Visit: Payer: BC Managed Care – PPO | Admitting: Occupational Therapy

## 2022-03-22 DIAGNOSIS — I890 Lymphedema, not elsewhere classified: Secondary | ICD-10-CM | POA: Diagnosis not present

## 2022-03-22 NOTE — Therapy (Signed)
[place] [facility] [facility] ?[address] ?[place], [place], [address] ?Phone: [phone]   Fax:  [phone] ? ?Occupational Therapy Treatment ? ?Patient Details  ?Name: Andrea Randolph ?MRN: [id number] ?Date of Birth: [birth year] ?Referring Provider (OT): Dr. Tasia Catchings ? ? ?Encounter Date: 03/22/2022 ? ? OT End of Session - 03/22/22 1623   ? ? Visit Number 2   ? Number of Visits 6   ? Date for OT Re-Evaluation 04/26/22   ? OT Start Time 1620   ? OT Stop Time 1658   ? OT Time Calculation (min) 38 min   ? Activity Tolerance Patient tolerated treatment well   ? Behavior During Therapy [facility] for tasks assessed/performed   ? ?  ?  ? ?  ? ? ?Past Medical History:  ?Diagnosis Date  ? Allergy   ? Anemia   ? Arthritis of right shoulder region   ? Dr. Tamala Julian  ? Cancer [facility])   ? breast cancer right side  ? Chronic insomnia   ? Diabetes mellitus without complication ([place])   ? Family history of adverse reaction to anesthesia   ? sister with Nausea  ? GERD (gastroesophageal reflux disease)   ? Goiter   ? Dr. Gabriel Carina  ? Heart murmur   ? Hyperlipidemia   ? Hypertension   ? Hypertensive retinopathy of left eye   ? Hypothyroidism, adult   ? Dr. Gabriel Carina  ? Inflammation of urethra   ? Menopause   ? Microalbuminuria   ? DM  ? Mixed incontinence   ? Muscle cramps   ? Obesity   ? OSA (obstructive sleep apnea)   ? Personal history of radiation therapy   ? Breast Cancer Right breast  ? Radiculitis, lumbosacral   ? Vitamin D deficiency   ? ? ?Past Surgical History:  ?Procedure Laterality Date  ? BREAST BIOPSY Left 90's  ? benign core needle   ? BREAST BIOPSY Right 02/07/2021  ? [place] Bx, Q-clip, path pending   ? BREAST LUMPECTOMY Right 03/06/2021  ? BREAST LUMPECTOMY WITH SENTINEL LYMPH NODE BIOPSY Right 03/06/2021  ? Procedure: BREAST LUMPECTOMY WITH SENTINEL LYMPH NODE BX;  Surgeon: Robert Bellow, MD;  Location: [facility] ORS;  Service: General;  Laterality: Right;  ? COLONOSCOPY WITH PROPOFOL N/A 01/26/2019  ?  Procedure: COLONOSCOPY WITH PROPOFOL;  Surgeon: Lin Landsman, MD;  Location: [facility] ENDOSCOPY;  Service: Gastroenterology;  Laterality: N/A;  ? COLONOSCOPY WITH PROPOFOL N/A 02/09/2022  ? Procedure: COLONOSCOPY WITH PROPOFOL;  Surgeon: Jonathon Bellows, MD;  Location: [facility] ENDOSCOPY;  Service: Gastroenterology;  Laterality: N/A;  ? FOOT SURGERY Bilateral   ? heel spurs  ? JOINT REPLACEMENT    ? TOTAL SHOULDER REPLACEMENT Right 01/25/2015  ? Dr. Roland Rack  ? TOTAL THYROIDECTOMY  08/01/2018  ? ? ?There were no vitals filed for this visit. ? ? Subjective Assessment - 03/22/22 1623   ? ? Subjective  I did okay.  The top compression keeps on rolling down so I do not know how good my arms can look.  But my hand feels stiff and tight actually both of them.  I am just using them a lot at work.  And this left thumb Triggering and Then My Right Ring Finger 2.  I Did See Dr. Posey Pronto My Rheumatologist This Week.   ? Pertinent History Dr Tasia Catchings - 03/07/22 ASSESSMENT & PLAN:   1. Malignant 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, estrogen receptor positive ([place])  2. Aromatase inhibitor use   3. Stage 3b chronic kidney disease ([place])    Cancer Staging   Malignant 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, estrogen receptor positive ([place])  Staging form: Breast, AJCC 8th Edition  - Pathologic stage from 03/20/2021: Stage IA (pT1a, pN0, cM0, G2, ER+, PR+, HER2-) - Signed by Earlie Server, MD on 03/20/2021        Right breast invasive carcinoma.  pT1a pN0, ER positive, PR positive, HER2 negative.  Focal DCIS margin positive.Patient declined reexcision.s/p adjuvant radiation.   Unable to tolerate Arimidex  switched to Aromasin.  Labs reviewed and discussed with patient  Continue Aromasin 1m daily.   01/15/2022  bilateral diagnostic mammogram results were reviewed and discussed with patient.  Continue annual mammogram.  Bone health, 05/16/2021, normal bone density.  Repeat in 2 years.  Recommend calcium 1200 mg daily.  She  has a normal baseline bone density.        Chronic kidney disease, stage III.  Avoid nephrotoxin encourage hydration.   -refer to OT for R UE and thoracic lymphedema   ? Patient Stated Goals Do not want my arm to get bigger or heavier.   ? Currently in Pain? No/denies   ? ?  ?  ? ?  ? ? ? ? ? ? LYMPHEDEMA/ONCOLOGY QUESTIONNAIRE - 03/22/22 0001   ? ?  ? Right Upper Extremity Lymphedema  ? 15 cm Proximal to Olecranon Process 40.5 cm   ? 10 cm Proximal to Olecranon Process 39 cm   ? Olecranon Process 30.4 cm   ? 15 cm Proximal to Ulnar Styloid Process 30.4 cm   ? 10 cm Proximal to Ulnar Styloid Process 26.4 cm   ? Just Proximal to Ulnar Styloid Process 20.5 cm   ? Across Hand at [employer]21 cm   ? [facility] 6.5 [facility]   ? [facility]   ?  ? Left Upper Extremity Lymphedema  ? 15 cm Proximal to Olecranon Process 41 cm   ? 10 cm Proximal to Olecranon Process 38 cm   ? Olecranon Process 29 cm   ? 15 cm Proximal to Ulnar Styloid Process 29.4 cm   ? 10 cm Proximal to Ulnar Styloid Process 25 cm   ? Just Proximal to Ulnar Styloid Process 19.6 cm   ? Across Hand at [employer]21 cm   ? ?  ?  ? ?  ? ? ? ? ?  ?  Patient arrived with Isotoner glove on the right hand and a Tubigrip D from hand to elbow and Tubigrip E from mid forearm to upper arm since then .Provided handout on lymphedema signs and symptoms as well as prevention and treatment when seen for screening over the cancer center. ?Patient seen now for 2 weeks of wearing temporary compression using Isotoner glove with Tubigrip D" and Tubigrip E- ?Patient showed decrease in circumference of right upper extremity wearing a day and nighttime removing it for bathing or dressing.   ?Patient right upper extremity decongested to about 1 to 1.4 cm increased compared to the left.  Patient is right-hand dominant so expecting to be a little bit larger.   ?Patient ready to be sent for measurement for daytime compression sleeve over-the-counter as well as  a glove.  Information provided for patient to go to [facility] to get measured and fitted and we will follow-up with a email to [facility] for  measuring patient in the next 2-3 business days.  Patient to wear daytime compression sleeve and glove during the day and Tubigrip with Isotoner glove at nighttime and follow-up with me in 2 weeks to see if we can wean her out of nighttime compression with only a daytime compression sleeve at work.   ? ? ? ? ? ? ? ? ? ? ? ? ? OT Education - 03/22/22 1623   ? ? Education Details progress and home program   ? Person(s) Educated Patient   ? Methods Explanation;Demonstration;Tactile cues;Verbal cues;Handout   ? Comprehension Verbal cues required;Returned demonstration;Verbalized understanding   ? ?  ?  ? ?  ? ? ? ? ? ? OT Long Term Goals - 03/15/22 1705   ? ?  ? OT LONG TERM GOAL #1  ? Title Patient to be independent in home program to decrease circumference in the right upper extremity to be fitted for compression garments.   ? Baseline Patient has no knowledge of home program.  Upper arm increased 2.4 cm elbow 1 cm, forearm 2 cm and wrist 1.4 cm compared to the left side   ? Time 4   ? Period Weeks   ? Status New   ? Target Date 04/12/22   ?  ? OT LONG TERM GOAL #2  ? Title Assess patient for thoracic lymphedema and breast to assess if patient need home program for manual massage or Jovi pack breast pad.   ? Baseline Patient made a mention in survivorship meeting and during screening that breast is sometimes harder or fuller.   ? Time 4   ? Period Weeks   ? Status New   ? Target Date 04/12/22   ?  ? OT LONG TERM GOAL #3  ? Title Patient's right upper extremity decreased in circumference for patient to be fitted with a compression daytimes sleeve and if needed at nighttime to maintain right upper extremity circumference.   ? Baseline Patient with no compression report increase size of right arm and breast at times per patient right swelling stays in arm.  Upper arm increased  2.4 cm, elbow 1 cm, forearm 2 cm and wrist 1.4 cm   ? Time 6   ? Period Weeks   ? Status New   ? Target Date 04/26/22   ? ?  ?  ? ?  ? ? ? ? ? ? ? ? Plan - 03/22/22 1623   ? ? Clinical Impression Sta

## 2022-03-28 ENCOUNTER — Telehealth: Payer: Self-pay | Admitting: Licensed Clinical Social Worker

## 2022-03-28 ENCOUNTER — Encounter: Payer: Self-pay | Admitting: Licensed Clinical Social Worker

## 2022-03-28 ENCOUNTER — Ambulatory Visit: Payer: Self-pay | Admitting: Licensed Clinical Social Worker

## 2022-03-28 DIAGNOSIS — Z1379 Encounter for other screening for genetic and chromosomal anomalies: Secondary | ICD-10-CM | POA: Insufficient documentation

## 2022-03-28 NOTE — Progress Notes (Signed)
HPI:  Ms. Swailes was previously seen in the [facility] due to a personal history of breast cancer and colon polyps and concerns regarding a hereditary predisposition to cancer. Please refer to our prior cancer genetics clinic note for more information regarding our discussion, assessment and recommendations, at the time. Ms. Bogdan recent genetic test results were disclosed to her, as were recommendations warranted by these results. These results and recommendations are discussed in more detail below. ? ?CANCER HISTORY:  ?Oncology History  ?Malignant 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, estrogen receptor positive ([place])  ?02/20/2021 Initial Diagnosis  ? Malignant 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, estrogen receptor positive ([place]) ? ?  ?03/20/2021 Cancer Staging  ? Staging form: Breast, AJCC 8th Edition ?- Pathologic stage from 03/20/2021: Stage IA (pT1a, pN0, cM0, G2, ER+, PR+, HER2-) - Signed by Earlie Server, MD on 03/20/2021 ?Stage prefix: Initial diagnosis ?Nuclear grade: G2 ?Multigene prognostic tests performed: None ?Histologic grading system: 3 grade system ? ?  ? ? ?FAMILY HISTORY:  ?We obtained a detailed, 4-generation family history.  Significant diagnoses are listed below: ?Family History  ?Problem Relation Age of Onset  ? Heart disease Mother   ? Heart disease Father   ? Kidney disease Sister   ? Hypertension Sister   ? Diabetes Sister   ? Hypertension Sister   ? Kidney failure Sister   ? Hypertension Brother   ? CVA Brother   ? Breast cancer Maternal Aunt 40  ? Ovarian cancer Neg Hx   ? Colon cancer Neg Hx   ? ?Ms. Gugliotta has 1 daughter, 83. She has 2 sisters, 2 brothers. One brother had throat cancer. None of her siblings have had colon polyps that she is aware of. Her daughter has not yet had a colonoscopy but is in process of scheduling one.  ?  ?Ms. Ruvalcaba's mother died at 37 of heart attack. Patient had 3 maternal aunts, 1 uncle. An aunt had  breast cancer and passed at 11. No known cancers in cousins. No known info about maternal grandparents. ?  ?Ms. Waguespack's father died at 39 of stroke/heart attack. Patient had 1 paternal uncle, no cancers. No information about paternal grandparents. ?  ?Ms. Almendariz is unaware of previous family history of genetic testing for hereditary cancer risks. There is no reported Ashkenazi Jewish ancestry. There is no known consanguinity. ? ? ?GENETIC TEST RESULTS: Genetic testing reported out on 03/28/2022 through the [employer] CancerNext-Expanded+RNA cancer panel found no pathogenic mutations.  ? ?The CancerNext-Expanded + RNAinsight gene panel offered by [employer] and includes sequencing and rearrangement analysis for the following 77 genes: IP, ALK, APC*, ATM*, AXIN2, BAP1, BARD1, BLM, BMPR1A, BRCA1*, BRCA2*, BRIP1*, CDC73, CDH1*,CDK4, CDKN1B, CDKN2A, CHEK2*, CTNNA1, DICER1, FANCC, FH, FLCN, GALNT12, KIF1B, LZTR1, MAX, MEN1, MET, MLH1*, MSH2*, MSH3, MSH6*, MUTYH*, NBN, NF1*, NF2, NTHL1, PALB2*, PHOX2B, PMS2*, POT1, PRKAR1A, PTCH1, PTEN*, RAD51C*, RAD51D*,RB1, RECQL, RET, SDHA, SDHAF2, SDHB, SDHC, SDHD, SMAD4, SMARCA4, SMARCB1, SMARCE1, STK11, SUFU, TMEM127, TP53*,TSC1, TSC2, VHL and XRCC2 (sequencing and deletion/duplication); EGFR, EGLN1, HOXB13, KIT, MITF, PDGFRA, POLD1 and POLE (sequencing only); EPCAM and GREM1 (deletion/duplication only).  ? ?The test report has been scanned into EPIC and is located under the Molecular Pathology section of the Results Review tab.  A portion of the result report is included below for reference.  ? ? ? ?We discussed that because current genetic testing is not perfect, it is possible there may be a gene mutation  in one of these genes that current testing cannot detect, but that chance is small.  There could be another gene that has not yet been discovered, or that we have not yet tested, that is responsible for the cancer diagnoses in the family. It is also possible there is a hereditary  cause for the cancer in the family that Ms. Penick did not inherit and therefore was not identified in her testing.  Therefore, it is important to remain in touch with cancer genetics in the future so that we can continue to offer Ms. Hobdy the most up to date genetic testing.  ? ?ADDITIONAL GENETIC TESTING: We discussed with Ms. Langill that her genetic testing was fairly extensive.  If there are genes identified to increase cancer risk that can be analyzed in the future, we would be happy to discuss and coordinate this testing at that time.   ? ?CANCER SCREENING RECOMMENDATIONS: Ms. Foley test result is considered negative (normal).  This means that we have not identified a hereditary cause for her  personal and family history of cancer at this time. Most cancers happen by chance and this negative test suggests that her cancer may fall into this category.   ? ?While reassuring, this does not definitively rule out a hereditary predisposition to cancer. It is still possible that there could be genetic mutations that are undetectable by current technology. There could be genetic mutations in genes that have not been tested or identified to increase cancer risk.  Therefore, it is recommended she continue to follow the cancer management and screening guidelines provided by her  primary healthcare provider.  ? ?An individual's cancer risk and medical management are not determined by genetic test results alone. Overall cancer risk assessment incorporates additional factors, including personal medical history, family history, and any available genetic information that may result in a personalized plan for cancer prevention and surveillance. ? ?This negative genetic test simply tells [place] that we cannot yet define why Ms. Mcmenamin has had  an increased number of colorectal polyps.  Ms. [facility] medical management and screening should be based on the prospect that she will likely form more colon polyps and should, therefore,  undergo more frequent colonoscopy screening at intervals determined by her GI providers.  We also recommended that Ms. Newlon have an upper endoscopy periodically. ? ?RECOMMENDATIONS FOR FAMILY MEMBERS:  Relatives in this family might be at some increased risk of developing cancer, over the general population risk, simply due to the family history of cancer.  We recommended female relatives in this family have a yearly mammogram beginning at age 57, or 36 years younger than the earliest onset of cancer, an annual clinical breast exam, and perform monthly breast self-exams. Female relatives in this family should also have a gynecological exam as recommended by their primary provider.  All family members should be referred for colonoscopy starting at age 13.  ? ?FOLLOW-UP: Lastly, we discussed with Ms. Sessa that cancer genetics is a rapidly advancing field and it is possible that new genetic tests will be appropriate for her and/or her family members in the future. We encouraged her to remain in contact with cancer genetics on an annual basis so we can update her personal and family histories and let her know of advances in cancer genetics that may benefit this family.  ? ?Our contact number was provided. Ms. Nardelli questions were answered to her satisfaction, and she knows she is welcome to call us at anytime with additional  questions or concerns.  ? ?Faith Rogue, MS, LCGC ?Genetic Counselor ?[email or web address] ?Phone: [phone] ? ?

## 2022-03-28 NOTE — Telephone Encounter (Signed)
Revealed negative genetic testing.  This normal result is reassuring and indicates that it is unlikely Andrea Randolph's cancer/colon polyps is due to a hereditary cause.  It is unlikely that there is an increased risk of another cancer due to a mutation in one of these genes.  However, genetic testing is not perfect, and cannot definitively rule out a hereditary cause.  It will be important for her to keep in contact with genetics to learn if any additional testing may be needed in the future.    ? ?

## 2022-03-29 DIAGNOSIS — I890 Lymphedema, not elsewhere classified: Secondary | ICD-10-CM | POA: Diagnosis not present

## 2022-04-02 ENCOUNTER — Other Ambulatory Visit: Payer: Self-pay

## 2022-04-02 ENCOUNTER — Emergency Department: Payer: BC Managed Care – PPO

## 2022-04-02 ENCOUNTER — Encounter: Payer: Self-pay | Admitting: Nurse Practitioner

## 2022-04-02 ENCOUNTER — Ambulatory Visit: Payer: BC Managed Care – PPO | Admitting: Nurse Practitioner

## 2022-04-02 ENCOUNTER — Ambulatory Visit: Payer: Self-pay

## 2022-04-02 ENCOUNTER — Emergency Department
Admission: EM | Admit: 2022-04-02 | Discharge: 2022-04-02 | Disposition: A | Discharge status: Home / Self Care | Payer: BC Managed Care – PPO | Attending: Emergency Medicine | Admitting: Emergency Medicine

## 2022-04-02 VITALS — BP 144/80 | HR 113 | Temp 98.8°F | Resp 18 | Ht 64.0 in | Wt 218.6 lb

## 2022-04-02 DIAGNOSIS — K2970 Gastritis, unspecified, without bleeding: Secondary | ICD-10-CM | POA: Diagnosis not present

## 2022-04-02 DIAGNOSIS — J9811 Atelectasis: Secondary | ICD-10-CM | POA: Diagnosis not present

## 2022-04-02 DIAGNOSIS — R7989 Other specified abnormal findings of blood chemistry: Secondary | ICD-10-CM | POA: Diagnosis not present

## 2022-04-02 DIAGNOSIS — R0789 Other chest pain: Secondary | ICD-10-CM

## 2022-04-02 DIAGNOSIS — I10 Essential (primary) hypertension: Secondary | ICD-10-CM | POA: Diagnosis not present

## 2022-04-02 DIAGNOSIS — R9431 Abnormal electrocardiogram [ECG] [EKG]: Secondary | ICD-10-CM

## 2022-04-02 DIAGNOSIS — R079 Chest pain, unspecified: Secondary | ICD-10-CM

## 2022-04-02 LAB — CBC
HCT: 45.9 % (ref 36.0–46.0)
Hemoglobin: 14.9 g/dL (ref 12.0–15.0)
MCH: 27.2 pg (ref 26.0–34.0)
MCHC: 32.5 g/dL (ref 30.0–36.0)
MCV: 83.8 fL (ref 80.0–100.0)
Platelets: 372 10*3/uL (ref 150–400)
RBC: 5.48 MIL/uL — ABNORMAL HIGH (ref 3.87–5.11)
RDW: 14.1 % (ref 11.5–15.5)
WBC: 9.2 10*3/uL (ref 4.0–10.5)
nRBC: 0 % (ref 0.0–0.2)

## 2022-04-02 LAB — BASIC METABOLIC PANEL
Anion gap: 11 (ref 5–15)
BUN: 14 mg/dL (ref 8–23)
CO2: 27 mmol/L (ref 22–32)
Calcium: 10.1 mg/dL (ref 8.9–10.3)
Chloride: 101 mmol/L (ref 98–111)
Creatinine, Ser: 1.27 mg/dL — ABNORMAL HIGH (ref 0.44–1.00)
GFR, Estimated: 47 mL/min — ABNORMAL LOW (ref >60)
Glucose, Bld: 151 mg/dL — ABNORMAL HIGH (ref 70–99)
Potassium: 3.5 mmol/L (ref 3.5–5.1)
Sodium: 139 mmol/L (ref 135–145)

## 2022-04-02 LAB — CBG MONITORING, ED: Glucose-Capillary: 169 mg/dL — ABNORMAL HIGH (ref 70–99)

## 2022-04-02 LAB — TROPONIN I (HIGH SENSITIVITY)
Troponin I (High Sensitivity): 47 ng/L — ABNORMAL HIGH (ref <18)
Troponin I (High Sensitivity): 48 ng/L — ABNORMAL HIGH (ref <18)

## 2022-04-02 LAB — D-DIMER, QUANTITATIVE: D-Dimer, Quant: 0.55 ug/mL-FEU — ABNORMAL HIGH (ref 0.00–0.50)

## 2022-04-02 MED ORDER — ALUM & MAG HYDROXIDE-SIMETH 200-200-20 MG/5ML PO SUSP
30.0000 mL | Freq: Once | ORAL | Status: AC
  Administered 2022-04-02: 30 mL via ORAL
  Filled 2022-04-02: qty 30

## 2022-04-02 MED ORDER — IOHEXOL 350 MG/ML SOLN
75.0000 mL | Freq: Once | INTRAVENOUS | Status: AC | PRN
  Administered 2022-04-02: 75 mL via INTRAVENOUS

## 2022-04-02 MED ORDER — LIDOCAINE VISCOUS HCL 2 % MT SOLN
15.0000 mL | Freq: Once | OROMUCOSAL | Status: AC
  Administered 2022-04-02: 15 mL via ORAL
  Filled 2022-04-02: qty 15

## 2022-04-02 NOTE — ED Notes (Signed)
Pt denies pain at this time.  States that she last felt pain was around 1600 while in waiting room.

## 2022-04-02 NOTE — ED Provider Notes (Signed)
[facility] Provider Note    Event Date/Time   First MD Initiated Contact with Patient 04/02/22 2135     (approximate)   History   Chest Pain   HPI  Andrea Randolph is a 66 y.o. female  who, per clinic note dated 03/20/22 has history of positive ana, who presents to the emergency department because of concern for chest pain.  Patient states that the chest pain started earlier this morning.  Located along the left side.  She states it starts lower down in her chest and then comes towards the middle.  She initially felt like it was a catch or gas.  She did feel somewhat short of breath with this pain when she was trying to exert herself.  Patient denies similar pain in the past.  At the time my exam her symptoms have improved although she still feels slight discomfort in the left lower chest.   Physical Exam   Triage Vital Signs: ED Triage Vitals  Enc Vitals Group     BP 04/02/22 1451 (!) 171/86     Pulse Rate 04/02/22 1451 92     Resp 04/02/22 1451 18     Temp 04/02/22 1451 98.8 F (37.1 C)     Temp Source 04/02/22 1451 Oral     SpO2 04/02/22 1451 96 %     Weight 04/02/22 1446 218 lb (98.9 kg)     Height 04/02/22 1446 '5\' 4"'$  (1.626 m)     Head Circumference --      Peak Flow --      Pain Score 04/02/22 1445 4     Pain Loc --      Pain Edu? --      Excl. in [place]? --     Most recent vital signs: Vitals:   04/02/22 1819 04/02/22 2050  BP: (!) 174/88 (!) 155/80  Pulse: 87 80  Resp: 16 18  Temp:    SpO2: 98% 98%    General: Awake, alert and oriented. CV:  Good peripheral perfusion. Regular rate and rhythm. Resp:  Normal effort. Lungs clear to auscultation. Abd:  No distention.    ED Results / Procedures / Treatments   Labs (all labs ordered are listed, but only abnormal results are displayed) Labs Reviewed  BASIC METABOLIC PANEL - Abnormal; Notable for the following components:      Result Value   Glucose, Bld 151 (*)    Creatinine, Ser 1.27  (*)    GFR, Estimated 47 (*)    All other components within normal limits  CBC - Abnormal; Notable for the following components:   RBC 5.48 (*)    All other components within normal limits  D-DIMER, QUANTITATIVE - Abnormal; Notable for the following components:   D-Dimer, Quant 0.55 (*)    All other components within normal limits  CBG MONITORING, ED - Abnormal; Notable for the following components:   Glucose-Capillary 169 (*)    All other components within normal limits  TROPONIN I (HIGH SENSITIVITY) - Abnormal; Notable for the following components:   Troponin I (High Sensitivity) 47 (*)    All other components within normal limits  TROPONIN I (HIGH SENSITIVITY) - Abnormal; Notable for the following components:   Troponin I (High Sensitivity) 48 (*)    All other components within normal limits     EKG  I, Nance Pear, attending physician, personally viewed and interpreted this EKG  EKG Time: 1442 Rate: 95 Rhythm: normal sinus rhythm  Axis: normal Intervals: qtc 454 QRS: narrow ST changes: no st elevation, t wave inversion V3-V6 Impression: abnormal ekg    RADIOLOGY I independently interpreted and visualized the CXR. My interpretation: No pneumonia. No pneumothorax. Radiology interpretation:  IMPRESSION:  1. Minimal right basilar atelectasis.    PROCEDURES:  Critical Care performed: No  Procedures   MEDICATIONS ORDERED IN ED: Medications - No data to display   IMPRESSION / MDM / [place] / ED COURSE  I reviewed the triage vital signs and the nursing notes.                              Differential diagnosis includes, but is not limited to, acs, pneumonia, pe, ptx.  Patient presents to the emergency department today because of concern for chest pain. Troponin was initially elevated, however repeat without any significant change. At this time I do have low suspicion for ACS, would expect change in troponin. D-dimer was checked from triage and  was elevated, due to this ct angio was obtained which fortunately did not show any PE or other concerning findings. Patient stated she did feel better after GI cocktail. At this time do think likely gastritis/esophagitis. Discussed this with the patient. Given stable troponin and clinical improvement do not feel inpatient admission is necessary at this time. Encouraged patient to follow up with cardiology.   FINAL CLINICAL IMPRESSION(S) / ED DIAGNOSES   Final diagnoses:  Atypical chest pain  Gastritis, presence of bleeding unspecified, unspecified chronicity, unspecified gastritis type     Note:  This document was prepared using Dragon voice recognition software and may include unintentional dictation errors.    Nance Pear, MD 04/02/22 [phone]

## 2022-04-02 NOTE — Discharge Instructions (Signed)
Please seek medical attention for any high fevers, chest pain, shortness of breath, change in behavior, persistent vomiting, bloody stool or any other new or concerning symptoms.  

## 2022-04-02 NOTE — ED Triage Notes (Signed)
Pt here with cp that started at 0300 this morning. Pt states pain is intermittent and starts on her left and radiates to her back. Pt denies N/V/D but some dizziness.

## 2022-04-02 NOTE — ED Provider Triage Note (Signed)
Emergency Medicine Provider Triage Evaluation Note  HERTHA GERGEN , a 66 y.o. female  was evaluated in triage.  Pt complains of left-sided chest pain with radiation to the left arm.  Review of Systems  Positive: See above Negative: Shortness of breath  Physical Exam  Ht '5\' 4"'$  (1.626 m)   Wt 98.9 kg   BMI 37.42 kg/m  Gen:   Awake, no distress   Resp:  Normal effort  MSK:   Moves extremities without difficulty  Other:    Medical Decision Making  Medically screening exam initiated at 2:48 PM.  Appropriate orders placed.  [name] was informed that the remainder of the evaluation will be completed by another provider, this initial triage assessment does not replace that evaluation, and the importance of remaining in the ED until their evaluation is complete.  Patient has history of diabetes.  We will do CBG in triage   Versie Starks, PA-C 04/02/22 1448

## 2022-04-02 NOTE — Telephone Encounter (Signed)
    Chief Complaint: Left breast pain. Called EMS this morning "and they said it's not my heart and to see my doctor." Symptoms: Pain that "feels like gas pain." Frequency: This morning Pertinent Negatives: Patient denies nausea, SOB, or other symptoms. Disposition: '[]'$ ED /'[]'$ Urgent Care (no appt availability in office) / '[x]'$ Appointment(In office/virtual)/ '[]'$  [facility]/ '[]'$ Home Care/ '[]'$ Refused Recommended Disposition /'[]'$ [place] [facility]/ '[]'$  Follow-up with PCP Additional Notes: Appointment made per [facility] in the practice. Instructed pt. To call 911 for worsening of symptoms.  Reason for Disposition  [1] Chest pain lasts > 5 minutes AND [2] occurred > 3 days ago (72 hours) AND [3] NO chest pain or cardiac symptoms now  Answer Assessment - Initial Assessment Questions 1. LOCATION: "Where does it hurt?"       Left breast 2. RADIATION: "Does the pain go anywhere else?" (e.g., into neck, jaw, arms, back)     Back 3. ONSET: "When did the chest pain begin?" (Minutes, hours or days)      This morning 4. PATTERN "Does the pain come and go, or has it been constant since it started?"  "Does it get worse with exertion?"      Constant 5. DURATION: "How long does it last" (e.g., seconds, minutes, hours)     Hours 6. SEVERITY: "How bad is the pain?"  (e.g., Scale 1-10; mild, moderate, or severe)    - MILD (1-3): doesn't interfere with normal activities     - MODERATE (4-7): interferes with normal activities or awakens from sleep    - SEVERE (8-10): excruciating pain, unable to do any normal activities       8 7. CARDIAC RISK FACTORS: "Do you have any history of heart problems or risk factors for heart disease?" (e.g., angina, prior heart attack; diabetes, high blood pressure, high cholesterol, smoker, or strong family history of heart disease)     Yes 8. PULMONARY RISK FACTORS: "Do you have any history of lung disease?"  (e.g., blood clots in lung, asthma, emphysema, birth control  pills)     No 9. CAUSE: "What do you think is causing the chest pain?"     Unsure 10. OTHER SYMPTOMS: "Do you have any other symptoms?" (e.g., dizziness, nausea, vomiting, sweating, fever, difficulty breathing, cough)       Feels like gas 11. PREGNANCY: "Is there any chance you are pregnant?" "When was your last menstrual period?"       No  Protocols used: Chest Pain-A-AH

## 2022-04-02 NOTE — Progress Notes (Signed)
BP (!) 144/80   Pulse (!) 113   Temp 98.8 F (37.1 C)   Resp 18   Ht '5\' 4"'$  (1.626 m)   Wt 218 lb 9.6 oz (99.2 kg)   SpO2 97%   BMI 37.52 kg/m    Subjective:    Patient ID: Andrea Randolph, female    DOB: [birth year], 66 y.o.   MRN: [id number]  HPI: Andrea Randolph is a 66 y.o. female  Chief Complaint  Patient presents with   Chest Pain   Chest pain/EKG changes: Patient reports that she has had chest pain all day.  Patient reports the pain is a heavy tightness on the left side of her chest.  Patient denies any shortness of breath but says she does have nausea.  Patient reports that EMS came out to see her this morning and told her it was not her heart.  Did an EKG in the office.  Shows ST changes.  Patient sent to the emergency department.  EKG ventricular rate 96, ST elevation in lead II, ST depression in aVR and V6.   Relevant past medical, surgical, family and social history reviewed and updated as indicated. Interim medical history since our last visit reviewed. Allergies and medications reviewed and updated.  Review of Systems  Constitutional: Negative for fever or weight change.  Respiratory: Negative for cough and shortness of breath.   Cardiovascular: Positive for chest pain, negative palpitations.  Gastrointestinal: Negative for abdominal pain, no bowel changes.  Musculoskeletal: Negative for gait problem or joint swelling.  Skin: Negative for rash.  Neurological: Negative for dizziness or headache.  No other specific complaints in a complete review of systems (except as listed in HPI above).      Objective:    BP (!) 144/80   Pulse (!) 113   Temp 98.8 F (37.1 C)   Resp 18   Ht '5\' 4"'$  (1.626 m)   Wt 218 lb 9.6 oz (99.2 kg)   SpO2 97%   BMI 37.52 kg/m   Wt Readings from Last 3 Encounters:  04/02/22 218 lb (98.9 kg)  04/02/22 218 lb 9.6 oz (99.2 kg)  03/07/22 224 lb (101.6 kg)    Physical Exam Constitutional: Patient appears well-developed and well-nourished.  Obese  No distress.  HEENT: head atraumatic, normocephalic, pupils equal and reactive to light, neck supple, throat within normal limits Cardiovascular: Normal rate, regular rhythm and normal heart sounds.  No murmur heard. No BLE edema. Pulmonary/Chest: Effort normal and breath sounds normal. No respiratory distress. Abdominal: Soft.  There is no tenderness. Psychiatric: Patient has a normal mood and affect. behavior is normal. Judgment and thought content normal.  Results for orders placed or performed in visit on 03/07/22  Comprehensive metabolic panel  Result Value Ref Range   Sodium 139 135 - 145 mmol/L   Potassium 4.1 3.5 - 5.1 mmol/L   Chloride 105 98 - 111 mmol/L   CO2 26 22 - 32 mmol/L   Glucose, Bld 97 70 - 99 mg/dL   BUN 17 8 - 23 mg/dL   Creatinine, Ser 1.04 (H) 0.44 - 1.00 mg/dL   Calcium 9.0 8.9 - 10.3 mg/dL   Total Protein 7.0 6.5 - 8.1 g/dL   Albumin 3.7 3.5 - 5.0 g/dL   AST 18 15 - 41 U/L   ALT 15 0 - 44 U/L   Alkaline Phosphatase 37 (L) 38 - 126 U/L   Total Bilirubin 0.6 0.3 - 1.2 mg/dL   GFR, Estimated >  60 >60 mL/min   Anion gap 8 5 - 15  CBC with Differential/Platelet  Result Value Ref Range   WBC 4.0 4.0 - 10.5 K/uL   RBC 4.69 3.87 - 5.11 MIL/uL   Hemoglobin 12.8 12.0 - 15.0 g/dL   HCT 39.6 36.0 - 46.0 %   MCV 84.4 80.0 - 100.0 fL   MCH 27.3 26.0 - 34.0 pg   MCHC 32.3 30.0 - 36.0 g/dL   RDW 14.4 11.5 - 15.5 %   Platelets 323 150 - 400 K/uL   nRBC 0.0 0.0 - 0.2 %   Neutrophils Relative % 55 %   Neutro Abs 2.2 1.7 - 7.7 K/uL   Lymphocytes Relative 33 %   Lymphs Abs 1.3 0.7 - 4.0 K/uL   Monocytes Relative 7 %   Monocytes Absolute 0.3 0.1 - 1.0 K/uL   Eosinophils Relative 3 %   Eosinophils Absolute 0.1 0.0 - 0.5 K/uL   Basophils Relative 1 %   Basophils Absolute 0.0 0.0 - 0.1 K/uL   Immature Granulocytes 1 %   Abs Immature Granulocytes 0.03 0.00 - 0.07 K/uL      Assessment & Plan:   Problem List Items Addressed This Visit   None Visit Diagnoses      Chest pain, unspecified type    -  Primary   Obtained EKG, changes noted on EKG patient sent to the emergency department.   Relevant Orders   EKG 12-Lead   EKG abnormalities       Abnormalities noted on EKG.        Follow up plan: Return if symptoms worsen or fail to improve.

## 2022-04-03 ENCOUNTER — Encounter: Payer: Self-pay | Admitting: Family Medicine

## 2022-04-05 ENCOUNTER — Ambulatory Visit: Payer: BC Managed Care – PPO | Admitting: Occupational Therapy

## 2022-04-05 DIAGNOSIS — I890 Lymphedema, not elsewhere classified: Secondary | ICD-10-CM

## 2022-04-05 NOTE — Therapy (Signed)
[place] [address], [place], [address] Phone: [phone]   Fax:  [phone]  Occupational Therapy Treatment  Patient Details  Name: Andrea Randolph MRN: [id number] Date of Birth: 19-Jan-1956 Referring Provider (OT): Dr. Tasia Catchings   Encounter Date: 04/05/2022   OT End of Session - 04/05/22 1753     Visit Number 3    Number of Visits 6    Date for OT Re-Evaluation 04/26/22    OT Start Time 1628    OT Stop Time 1655    OT Time Calculation (min) 27 min    Activity Tolerance Patient tolerated treatment well    Behavior During Therapy [facility] for tasks assessed/performed             Past Medical History:  Diagnosis Date   Allergy    Anemia    Arthritis of right shoulder region    Dr. Tamala Julian   Cancer [facility])    breast cancer right side   Chronic insomnia    Diabetes mellitus without complication ([place])    Family history of adverse reaction to anesthesia    sister with Nausea   GERD (gastroesophageal reflux disease)    Goiter    Dr. Gabriel Carina   Heart murmur    Hyperlipidemia    Hypertension    Hypertensive retinopathy of left eye    Hypothyroidism, adult    Dr. Gabriel Carina   Inflammation of urethra    Menopause    Microalbuminuria    DM   Mixed incontinence    Muscle cramps    Obesity    OSA (obstructive sleep apnea)    Personal history of radiation therapy    Breast Cancer Right breast   Radiculitis, lumbosacral    Vitamin D deficiency     Past Surgical History:  Procedure Laterality Date   BREAST BIOPSY Left 90's   benign core needle    BREAST BIOPSY Right 02/07/2021   [place] Bx, Q-clip, path pending    BREAST LUMPECTOMY Right 03/06/2021   BREAST LUMPECTOMY WITH SENTINEL LYMPH NODE BIOPSY Right 03/06/2021   Procedure: BREAST LUMPECTOMY WITH SENTINEL LYMPH NODE BX;  Surgeon: Robert Bellow, MD;  Location: [facility] ORS;  Service: General;  Laterality: Right;   COLONOSCOPY WITH PROPOFOL N/A 01/26/2019    Procedure: COLONOSCOPY WITH PROPOFOL;  Surgeon: Lin Landsman, MD;  Location: [facility] ENDOSCOPY;  Service: Gastroenterology;  Laterality: N/A;   COLONOSCOPY WITH PROPOFOL N/A 02/09/2022   Procedure: COLONOSCOPY WITH PROPOFOL;  Surgeon: Jonathon Bellows, MD;  Location: [facility] ENDOSCOPY;  Service: Gastroenterology;  Laterality: N/A;   FOOT SURGERY Bilateral    heel spurs   JOINT REPLACEMENT     TOTAL SHOULDER REPLACEMENT Right 01/25/2015   Dr. Roland Rack   TOTAL THYROIDECTOMY  08/01/2018    There were no vitals filed for this visit.   Subjective Assessment - 04/05/22 1749     Subjective  Done okay has been wearing the sleep for about a week I reminded old feels okay.  Did not wear anything at nighttime.  Did not start exercising yet but I am trying to eat healthier after the survivorship meetings.    Pertinent History Dr Tasia Catchings - 03/07/22 ASSESSMENT & PLAN:   1. Malignant 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, estrogen receptor positive ([place])   2. Aromatase inhibitor use   3. Stage 3b chronic kidney disease ([place])    Cancer Staging   Malignant neoplasm of upper-inner quadrant  of right breast in female, estrogen receptor positive ([place])  Staging form: Breast, AJCC 8th Edition  - Pathologic stage from 03/20/2021: Stage IA (pT1a, pN0, cM0, G2, ER+, PR+, HER2-) - Signed by Earlie Server, MD on 03/20/2021        Right breast invasive carcinoma.  pT1a pN0, ER positive, PR positive, HER2 negative.  Focal DCIS margin positive.Patient declined reexcision.s/p adjuvant radiation.   Unable to tolerate Arimidex  switched to Aromasin.  Labs reviewed and discussed with patient  Continue Aromasin 63m daily.   01/15/2022  bilateral diagnostic mammogram results were reviewed and discussed with patient.  Continue annual mammogram.  Bone health, 05/16/2021, normal bone density.  Repeat in 2 years.  Recommend calcium 1200 mg daily.  She has a normal baseline bone density.        Chronic kidney disease, stage III.  Avoid  nephrotoxin encourage hydration.   -refer to OT for R UE and thoracic lymphedema    Patient Stated Goals Do not want my arm to get bigger or heavier.    Currently in Pain? No/denies                 LYMPHEDEMA/ONCOLOGY QUESTIONNAIRE - 04/05/22 0001       Right Upper Extremity Lymphedema   15 cm Proximal to Olecranon Process 41.4 cm    10 cm Proximal to Olecranon Process 39.4 cm    Olecranon Process 29.4 cm    15 cm Proximal to Ulnar Styloid Process 30 cm    10 cm Proximal to Ulnar Styloid Process 26.4 cm    Just Proximal to Ulnar Styloid Process 20.8 cm    Across Hand at [employer]21 cm    At [place] 2nd Digit 6.5 cm    [facility] cm      Left Upper Extremity Lymphedema   15 cm Proximal to Olecranon Process 41 cm    10 cm Proximal to Olecranon Process 39 cm    Olecranon Process 29 cm    15 cm Proximal to Ulnar Styloid Process 30 cm    10 cm Proximal to Ulnar Styloid Process 25.4 cm              Patient arrived with a  over-the-counter compression sleeve right upper extremity with glove Patient has been wearing it for about a week patient report no problems no numbness no pain. Patient denies wearing any compression at nighttime since last seen.    Circumference of right upper extremity wearing a daytime compression decreased to within normal limits compared to left upper extremity-patient is right-hand dominant.      Educated patient on wearing compression garment correctly as well as donning and doffing. Patient reports not getting back to exercise patient working long hours not retiring until the end of the year. Still fatigue from chemo and radiation therapy reviewed with patient options as well as what she done before diagnosis of breast cancer. Patient planning on returning back to gym starting next Tuesday-OT will follow-up with patient that day. Patient to follow-up for lymphedema monitoring as well as decongestion using daytime compression in  3 weeks.            OT Education - 04/05/22 1753     Education Details progress and home program    Person(s) Educated Patient    Methods Explanation;Demonstration;Tactile cues;Verbal cues;Handout    Comprehension Verbal cues required;Returned demonstration;Verbalized understanding  OT Long Term Goals - 04/05/22 1757       OT LONG TERM GOAL #1   Title Patient to be independent in home program to decrease circumference in the right upper extremity to be fitted for compression garments.    Status Achieved      OT LONG TERM GOAL #2   Title Assess patient for thoracic lymphedema and breast to assess if patient need home program for manual massage or Jovi pack breast pad.    Status Deferred      OT LONG TERM GOAL #3   Title Patient's right upper extremity decreased in circumference for patient to be fitted with a compression daytimes sleeve and if needed at nighttime to maintain right upper extremity circumference.    Baseline Patient with no compression report increase size of right arm and breast at times per patient right swelling stays in arm.  Upper arm increased 2.4 cm, elbow 1 cm, forearm 2 cm and wrist 1.4 cm  NOW patient was fitted  week ago with daytime compression sleeve, not wearing anything at night we will reassess if patient can contain lymphedema measurements with only daytime compression.    Time 3    Period Weeks    Status On-going    Target Date 04/26/22                   Plan - 04/05/22 1754     Clinical Impression Statement Patient referred to OT for evaluation for right upper extremity and thoracic/breast lymphedema.  Patient had lumpectomy in April 22, patient reports 5-6 lymph nodes was removed.  Patient had radiation June 22.  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  After survivorship meetings patient self referred her to Dr. For lymphedema screening  with this OT.  At evaluation patient showed increase circumference in right upper extremity 2.4 cm in the upper arm, 1 cm at the elbow, 2 cm in forearm and 1.4 cm at wrist compared to the left upper extremity.  Patient is right-hand dominant and was fitted with a daytime compression sleeve about a week ago.  Patient has been wearing that daily with no nighttime compression.  Patient measurements compared to the left upper extremity within normal limits this date would recommend for patient to wear compression sleeve for the next 3 weeks and follow-up. At this stage appear patient do not show any thoracic lymphedema and do not need any compression.  Patient reports being still feeling fatigue since chemo and radiation not having the motivation to get back to exercise.  Reviewed with patient options and what she done before her breast cancer.  Patient is planning on returning back to gym on Tuesday/workout.  OT will check in with patient.  Patiient can benefit from skilled OT services to decongest right upper extremity and decrease  risk for cellulitis and infection.    OT Occupational Profile and History Problem Focused Assessment - Including review of records relating to presenting problem    Occupational performance deficits (Please refer to evaluation for details): ADL's;IADL's;Work;Play;Leisure;Social Participation    Body Structure / Function / Physical Skills ADL;Edema    Rehab Potential Good    Clinical Decision Making Limited treatment options, no task modification necessary    Comorbidities Affecting Occupational Performance: None    Modification or Assistance to Complete Evaluation  No modification of tasks or assist necessary to complete eval    OT Frequency Biweekly  OT Duration 6 weeks    OT Treatment/Interventions Self-care/ADL training;Manual lymph drainage;Compression bandaging;DME and/or AE instruction;Manual Therapy;Patient/family education;Therapeutic exercise    Consulted and Agree  with Plan of Care Patient             Patient will benefit from skilled therapeutic intervention in order to improve the following deficits and impairments:   Body Structure / Function / Physical Skills: ADL, Edema       Visit Diagnosis: Lymphedema, not elsewhere classified    Problem List Patient Active Problem List   Diagnosis Date Noted   Genetic testing 03/28/2022   Stage 3b chronic kidney disease ([place]) 03/07/2022   Aromatase inhibitor use 05/30/2021   Complaints of total body pain 05/30/2021   Malignant 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, estrogen receptor positive ([place]) 02/20/2021   Thrombocytosis 11/01/2019   Carpal tunnel syndrome, bilateral 02/19/2019   Trigger finger of right thumb 02/19/2019   Flexor tenosynovitis of finger 02/19/2019   Severe obesity (BMI 35.0-35.9 with comorbidity) ([place]) 12/29/2018   Mild concentric left ventricular hypertrophy (LVH) 12/24/2017   Mild aortic stenosis by prior echocardiogram 12/24/2017   Mild aortic regurgitation 12/24/2017   CKD (chronic kidney disease), stage I 06/22/2016   Uterine fibroid 01/11/2016   Allergic rhinitis, seasonal 05/24/2015   Benign essential HTN 05/24/2015   Chronic constipation 05/24/2015   Diabetes mellitus with renal manifestation ([place]) 05/24/2015   Dyslipidemia 05/24/2015   Post-surgical hypothyroidism 05/24/2015   Mixed incontinence 05/24/2015   Microalbuminuria 05/24/2015   Menopause 05/24/2015   Obstructive apnea 05/24/2015   Morbid obesity ([place]) 05/24/2015   Hypertensive retinopathy 05/24/2015   Vitamin D deficiency 05/24/2015   Status post total replacement of right shoulder 03/11/2015    Rosalyn Gess, OTR/L,CLT 04/05/2022, 6:00 PM  [place] [facility] [address] [place], [address] Phone: [phone]   Fax:  [phone]  Name: Andrea Randolph MRN: [id number] Date of Birth: [birth year]

## 2022-04-06 DIAGNOSIS — H5213 Myopia, bilateral: Secondary | ICD-10-CM | POA: Diagnosis not present

## 2022-04-06 LAB — HM DIABETES EYE EXAM

## 2022-04-10 ENCOUNTER — Ambulatory Visit: Payer: BC Managed Care – PPO | Admitting: Medical

## 2022-04-10 ENCOUNTER — Encounter: Payer: Self-pay | Admitting: Medical

## 2022-04-10 VITALS — BP 120/80 | HR 66 | Ht 64.0 in | Wt 222.1 lb

## 2022-04-10 DIAGNOSIS — I2584 Coronary atherosclerosis due to calcified coronary lesion: Secondary | ICD-10-CM

## 2022-04-10 DIAGNOSIS — I2510 Atherosclerotic heart disease of native coronary artery without angina pectoris: Secondary | ICD-10-CM

## 2022-04-10 DIAGNOSIS — I350 Nonrheumatic aortic (valve) stenosis: Secondary | ICD-10-CM | POA: Diagnosis not present

## 2022-04-10 DIAGNOSIS — R072 Precordial pain: Secondary | ICD-10-CM | POA: Diagnosis not present

## 2022-04-10 DIAGNOSIS — E782 Mixed hyperlipidemia: Secondary | ICD-10-CM | POA: Diagnosis not present

## 2022-04-10 DIAGNOSIS — I10 Essential (primary) hypertension: Secondary | ICD-10-CM

## 2022-04-10 MED ORDER — EZETIMIBE 10 MG PO TABS: 10.0000 mg | ORAL_TABLET | Freq: Every day | ORAL | 3 refills | Status: DC

## 2022-04-10 MED ORDER — PANTOPRAZOLE SODIUM 40 MG PO TBEC: 40.0000 mg | DELAYED_RELEASE_TABLET | Freq: Every day | ORAL | 11 refills | Status: DC

## 2022-04-10 MED ORDER — METOPROLOL TARTRATE 100 MG PO TABS: 100.0000 mg | ORAL_TABLET | Freq: Once | ORAL | 0 refills | Status: DC

## 2022-04-10 NOTE — Patient Instructions (Signed)
Medication Instructions:  Your physician has recommended you make the following change in your medication:   START Protonix 40 mg once daily   *If you need a refill on your cardiac medications before your next appointment, please call your pharmacy*   Lab Work: None  If you have labs (blood work) drawn today and your tests are completely normal, you will receive your results only by: [place] (if you have MyChart) OR A paper copy in the mail If you have any lab test that is abnormal or we need to change your treatment, we will call you to review the results.   Testing/Procedures:   Your cardiac CT is scheduled at the below location:    [facility] [place], [place] [address] [phone]  SCHEDULED for April 19, 2022 at 08:45 am with arrival time of 08:30 am to check-in and test prep.  Please follow these instructions carefully (unless otherwise directed):  Hold all erectile dysfunction medications at least 3 days (72 hrs) prior to test.  On the Night Before the Test: Be sure to Drink plenty of water. Do not consume any caffeinated/decaffeinated beverages or chocolate 12 hours prior to your test. Do not take any antihistamines 12 hours prior to your test.   On the Day of the Test: Drink plenty of water until 1 hour prior to the test. Do not eat any food 4 hours prior to the test. You may take your regular medications prior to the test.  Take metoprolol (Lopressor) 100 mg two hours prior to test. HOLD Furosemide/Hydrochlorothiazide morning of the test. FEMALES- please wear underwire-free bra if available, avoid dresses & tight clothing       After the Test: Drink plenty of water. After receiving IV contrast, you may experience a mild flushed feeling. This is normal. On occasion, you may experience a mild rash up to 24 hours after the test. This is not dangerous. If this occurs, you can take Benadryl 25  mg and increase your fluid intake. If you experience trouble breathing, this can be serious. If it is severe call 911 IMMEDIATELY. If it is mild, please call our office. If you take any of these medications: Glipizide/Metformin, Avandament, Glucavance, please do not take 48 hours after completing test unless otherwise instructed.   For non-scheduling related questions, please contact the cardiac imaging nurse navigator should you have any questions/concerns: Marchia Bond, Cardiac Imaging Nurse Navigator Gordy Clement, Cardiac Imaging Nurse Navigator [facility] Direct Office Dial: [phone]   For scheduling needs, including cancellations and rescheduling, please call [place], [phone].    Follow-Up: At [facility], you and your health needs are our priority.  As part of our continuing mission to provide you with exceptional heart care, we have created designated Provider Care Teams.  These Care Teams include your primary Cardiologist (physician) and Advanced Practice Providers (APPs -  Physician Assistants and Nurse Practitioners) who all work together to provide you with the care you need, when you need it.   Your next appointment:   6 week(s)  The format for your next appointment:   In Person  Provider:   Ida Rogue, MD or Cadence Kathlen Mody, PA-C{    Important Information About Sugar

## 2022-04-10 NOTE — Progress Notes (Signed)
Cardiology Office Note:    Date:  04/10/2022   ID:  Andrea Randolph, DOB [birth year], MRN [id number]  PCP:  Steele Sizer, MD  [facility] Cardiologist:  Ida Rogue, MD  [place] Electrophysiologist:  None   Referring MD: Steele Sizer, MD   Chief Complaint: ER follow-up  History of Present Illness:    Andrea Randolph is a 66 y.o. female with a hx of  HTN, hypothyroidism, OSA, HLD, DM2, obesity, mild AI by echo 02/2018, mild LVH by echo 02/2018, Breast cancer s/p lumpectomy presents today for ER follow up.  Previous cardiac workup includes echo 2012 with EF 55-60%, mild AS/AI. Repeat echocardiogram 02/2018 with LVEF 60-65%, mild LVH, normal diastolic parameters, no RWMA, mild aortic regurgitation without aortic stenosis.    She has had difficulty with myalgias while taking statins.   Last seen 06/2021 and was overall doing well from a cardiac perspective.   ER visit 04/02/22 for chest pain. BP was high. HS trop 47>48. CT chest was negative for PE.   The patient reports the day of the ER she had chest pain started in the morning on the left side and radiated into the middle chest. She thought it was GERD at first, but when it didn't subside, she decided to go to the ER. In the ER she was given something for acid-reflux, and this helped the pain. Since then she has been eating slower and smaller portions. She had GERD in the past. No significant chest pain since the ER visit. Breathing is normal. No orthopnea, pnd or LLE.   Past Medical History:  Diagnosis Date   Allergy    Anemia    Arthritis of right shoulder region    Dr. Tamala Julian   Cancer [facility])    breast cancer right side   Chronic insomnia    Diabetes mellitus without complication (HCC)    Family history of adverse reaction to anesthesia    sister with Nausea   GERD (gastroesophageal reflux disease)    Goiter    Dr. Gabriel Carina   Heart murmur    Hyperlipidemia    Hypertension    Hypertensive retinopathy of left eye     Hypothyroidism, adult    Dr. Gabriel Carina   Inflammation of urethra    Menopause    Microalbuminuria    DM   Mixed incontinence    Muscle cramps    Obesity    OSA (obstructive sleep apnea)    Personal history of radiation therapy    Breast Cancer Right breast   Radiculitis, lumbosacral    Vitamin D deficiency     Past Surgical History:  Procedure Laterality Date   BREAST BIOPSY Left 90's   benign core needle    BREAST BIOPSY Right 02/07/2021   [place] Bx, Q-clip, path pending    BREAST LUMPECTOMY Right 03/06/2021   BREAST LUMPECTOMY WITH SENTINEL LYMPH NODE BIOPSY Right 03/06/2021   Procedure: BREAST LUMPECTOMY WITH SENTINEL LYMPH NODE BX;  Surgeon: Robert Bellow, MD;  Location: [facility] ORS;  Service: General;  Laterality: Right;   COLONOSCOPY WITH PROPOFOL N/A 01/26/2019   Procedure: COLONOSCOPY WITH PROPOFOL;  Surgeon: Lin Landsman, MD;  Location: [facility] ENDOSCOPY;  Service: Gastroenterology;  Laterality: N/A;   COLONOSCOPY WITH PROPOFOL N/A 02/09/2022   Procedure: COLONOSCOPY WITH PROPOFOL;  Surgeon: Jonathon Bellows, MD;  Location[facility]COPY;  Service: Gastroenterology;  Laterality: N/A;   FOOT SURGERY Bilateral    heel spurs   JOINT REPLACEMENT  TOTAL SHOULDER REPLACEMENT Right 01/25/2015   Dr. Roland Rack   TOTAL THYROIDECTOMY  08/01/2018    Current Medications: Current Meds  Medication Sig   Alirocumab (PRALUENT) 75 MG/ML SOAJ Inject 75 mg into the skin every 14 (fourteen) days.   aspirin 81 MG tablet Take 81 mg by mouth daily.   Calcium Carb-Cholecalciferol (CALCIUM 500+D3 PO) Take 2 tablets by mouth daily.   Cholecalciferol (VITAMIN D3) 1000 units CAPS Take 1,000 Units by mouth daily.   dapagliflozin propanediol (FARXIGA) 10 MG TABS tablet Take 1 tablet (10 mg total) by mouth daily.   diclofenac Sodium (VOLTAREN) 1 % GEL Apply 2 g topically 4 (four) times daily as needed.   exemestane (AROMASIN) 25 MG tablet Take 1 tablet (25 mg total) by mouth daily.   glucose blood  test strip    levothyroxine (SYNTHROID) 125 MCG tablet Take 1 tablet (125 mcg total) by mouth daily.   metoprolol tartrate (LOPRESSOR) 100 MG tablet Take 1 tablet (100 mg total) by mouth once for 1 dose. TAKE 2 hours prior to your test.   Olmesartan-amLODIPine-HCTZ 40-5-25 MG TABS Take 1 tablet by mouth daily at 12 noon.   pantoprazole (PROTONIX) 40 MG tablet Take 1 tablet (40 mg total) by mouth daily.   Semaglutide (RYBELSUS) 14 MG TABS Take 1 tablet by mouth daily at 6 (six) AM.   [DISCONTINUED] ezetimibe (ZETIA) 10 MG tablet Take 1 tablet (10 mg total) by mouth daily.     Allergies:   Rosuvastatin   Social History   Socioeconomic History   Marital status: Legally Separated    Spouse name: Not on file   Number of children: 1   Years of education: Not on file   Highest education level: 12th grade  Occupational History   Occupation: Cytogeneticist  Tobacco Use   Smoking status: Never   Smokeless tobacco: Never  Vaping Use   Vaping Use: Never used  Substance and Sexual Activity   Alcohol use: No    Alcohol/week: 0.0 standard drinks   Drug use: No   Sexual activity: Not Currently    Birth control/protection: Post-menopausal  Other Topics Concern   Not on file  Social History Narrative   Separated in around 2009.   She was living with her daughter, however currently moved in with her sister and nephew , but is looking for a house.    Social Determinants of Health   Financial Resource Strain: Low Risk    Difficulty of Paying Living Expenses: Not hard at all  Food Insecurity: No Food Insecurity   Worried About Charity fundraiser in the Last Year: Never true   [place] in the Last Year: Never true  Transportation Needs: No Transportation Needs   Lack of Transportation (Medical): No   Lack of Transportation (Non-Medical): No  Physical Activity: Inactive   Days of Exercise per Week: 0 days   Minutes of Exercise per Session: 0 min  Stress: No Stress Concern  Present   Feeling of Stress : Only a little  Social Connections: Moderately Integrated   Frequency of Communication with Friends and Family: More than three times a week   Frequency of Social Gatherings with Friends and Family: More than three times a week   Attends Religious Services: 1 to 4 times per year   Active Member of [employer] or Organizations: Yes   Attends Archivist Meetings: 1 to 4 times per year   Marital Status: Separated  Family History: The patient's family history includes Breast cancer (age of onset: 32) in her maternal aunt; CVA in her brother; Diabetes in her sister; Heart disease in her father and mother; Hypertension in her brother, sister, and sister; Kidney disease in her sister; Kidney failure in her sister. There is no history of Ovarian cancer or Colon cancer.  ROS:   Please see the history of present illness.     All other systems reviewed and are negative.  EKGs/Labs/Other Studies Reviewed:    The following studies were reviewed today:  Echo 2019 Study Conclusions   - Left ventricle: The cavity size was normal. There was mild    concentric hypertrophy. Systolic function was normal. The    estimated ejection fraction was in the range of 60% to 65%. Wall    motion was normal; there were no regional wall motion    abnormalities. Left ventricular diastolic function parameters    were normal.  - Aortic valve: Transvalvular velocity was within the normal range.    There was no stenosis. There was mild regurgitation.   EKG:  EKG is  ordered today.  The ekg ordered today demonstrates NSR 66bpm, diffuse TWI, no new changes  Recent Labs: 05/29/2021: Magnesium 2.2 03/07/2022: ALT 15; TSH 0.34 04/02/2022: BUN 14; Creatinine, Ser 1.27; Hemoglobin 14.9; Platelets 372; Potassium 3.5; Sodium 139  Recent Lipid Panel    Component Value Date/Time   CHOL 158 03/07/2022 0910   CHOL 174 12/21/2015 0856   TRIG 62 03/07/2022 0910   HDL 47 (L) 03/07/2022 0910    HDL 60 12/21/2015 0856   CHOLHDL 3.4 03/07/2022 0910   VLDL 9 12/28/2016 1209   LDLCALC 97 03/07/2022 0910    Physical Exam:    VS:  BP 120/80 (BP Location: Left Arm, Patient Position: Sitting, Cuff Size: Normal)   Pulse 66   Ht '5\' 4"'$  (1.626 m)   Wt 222 lb 2 oz (100.8 kg)   SpO2 97%   BMI 38.13 kg/m     Wt Readings from Last 3 Encounters:  04/10/22 222 lb 2 oz (100.8 kg)  04/02/22 218 lb (98.9 kg)  04/02/22 218 lb 9.6 oz (99.2 kg)     GEN:  Well nourished, well developed in no acute distress HEENT: Normal NECK: No JVD; No carotid bruits LYMPHATICS: No lymphadenopathy CARDIAC: RRR, + murmur, no rubs, gallops RESPIRATORY:  Clear to auscultation without rales, wheezing or rhonchi  ABDOMEN: Soft, non-tender, non-distended MUSCULOSKELETAL:  No edema; No deformity  SKIN: Warm and dry NEUROLOGIC:  Alert and oriented x 3 PSYCHIATRIC:  Normal affect   ASSESSMENT:    1. Precordial pain   2. Mixed hyperlipidemia   3. Coronary artery calcification   4. Mild aortic stenosis by prior echocardiogram   5. Essential hypertension    PLAN:    In order of problems listed above:  Chest pain Aortic and coronary calcifications Recent ER visit for chest pain with mild troponin elevated 48>47. On report chest pain sounds more atypical, although CT chest noted scattered coronary artery calcifications and she has multiple RF for CAD. EKG today with no ischemic changes. I will order a Cardiac CT. Continue Aspirin, Praluent, Zetia. I will given Protonix '40mg'$  daily. We will see her back at follow-up to re-assess symptoms.   Mild LVOT Echo in 2019 showed LVEF 60-65%, mild LVH with LVOT 52mHg.   Mild AI Echo in 2019 showed mild AI. Murmur on exam. Euvolemic on exam. Can order repeat echo at follow-up  for observation.  DM2 Most recent A1C 6.7. Followed by PCP.   HTN BP good today, continue current medications.   Disposition: Follow up in 6 week(s) with MD/APP    Signed, Bear Osten Ninfa Meeker, PA-C  04/10/2022 12:32 PM    [facility]

## 2022-04-17 ENCOUNTER — Telehealth (HOSPITAL_COMMUNITY): Payer: Self-pay | Admitting: *Deleted

## 2022-04-17 NOTE — Telephone Encounter (Signed)
Reaching out to patient to offer assistance regarding upcoming cardiac imaging study; pt verbalizes understanding of appt date/time, parking situation and where to check in, pre-test NPO status and medications ordered, and verified current allergies; name and call back number provided for further questions should they arise  Landrum Carbonell RN Navigator Cardiac Imaging [facility] [phone] office [phone] cell  Patient to take 50mg metoprolol tartrate two hours prior to her cardiac CT scan.  

## 2022-04-19 ENCOUNTER — Ambulatory Visit
Admission: RE | Admit: 2022-04-19 | Discharge: 2022-04-19 | Disposition: A | Discharge status: Home / Self Care | Payer: BC Managed Care – PPO | Source: Ambulatory Visit | Attending: Medical | Admitting: Medical

## 2022-04-19 DIAGNOSIS — R072 Precordial pain: Secondary | ICD-10-CM | POA: Diagnosis not present

## 2022-04-19 MED ORDER — IOHEXOL 350 MG/ML SOLN
100.0000 mL | Freq: Once | INTRAVENOUS | Status: AC | PRN
  Administered 2022-04-19: 100 mL via INTRAVENOUS

## 2022-04-19 MED ORDER — NITROGLYCERIN 0.4 MG SL SUBL
0.8000 mg | SUBLINGUAL_TABLET | Freq: Once | SUBLINGUAL | Status: AC
  Administered 2022-04-19: 0.8 mg via SUBLINGUAL

## 2022-04-19 MED ORDER — METOPROLOL TARTRATE 5 MG/5ML IV SOLN
5.0000 mg | Freq: Once | INTRAVENOUS | Status: AC
  Administered 2022-04-19: 5 mg via INTRAVENOUS

## 2022-04-19 NOTE — Progress Notes (Signed)
Patient tolerated CT well. Drank water after. Vital signs stable encourage to drink water throughout day.Reasons explained and verbalized understanding. Ambulated steady gait.  

## 2022-04-26 ENCOUNTER — Ambulatory Visit: Payer: BC Managed Care – PPO | Attending: Oncology | Admitting: Occupational Therapy

## 2022-04-26 DIAGNOSIS — I890 Lymphedema, not elsewhere classified: Secondary | ICD-10-CM | POA: Diagnosis not present

## 2022-04-26 NOTE — Therapy (Signed)
[place] [facility] [address], [place], [address] Phone: [phone]   Fax:  [phone]  Occupational Therapy Treatment  Patient Details  Name: Andrea Randolph MRN: [id number] Date of Birth: 16-May-1956 Referring Provider (OT): Dr. Tasia Catchings   Encounter Date: 04/26/2022   OT End of Session - 04/26/22 1717     Visit Number 4    Number of Visits 6    Date for OT Re-Evaluation 04/26/22    OT Start Time 1615    OT Stop Time 1649    OT Time Calculation (min) 34 min    Activity Tolerance Patient tolerated treatment well    Behavior During Therapy [facility] for tasks assessed/performed             Past Medical History:  Diagnosis Date   Allergy    Anemia    Arthritis of right shoulder region    Dr. Tamala Julian   Cancer [facility])    breast cancer right side   Chronic insomnia    Diabetes mellitus without complication ([place])    Family history of adverse reaction to anesthesia    sister with Nausea   GERD (gastroesophageal reflux disease)    Goiter    Dr. Gabriel Carina   Heart murmur    Hyperlipidemia    Hypertension    Hypertensive retinopathy of left eye    Hypothyroidism, adult    Dr. Gabriel Carina   Inflammation of urethra    Menopause    Microalbuminuria    DM   Mixed incontinence    Muscle cramps    Obesity    OSA (obstructive sleep apnea)    Personal history of radiation therapy    Breast Cancer Right breast   Radiculitis, lumbosacral    Vitamin D deficiency     Past Surgical History:  Procedure Laterality Date   BREAST BIOPSY Left 90's   benign core needle    BREAST BIOPSY Right 02/07/2021   [place] Bx, Q-clip, path pending    BREAST LUMPECTOMY Right 03/06/2021   BREAST LUMPECTOMY WITH SENTINEL LYMPH NODE BIOPSY Right 03/06/2021   Procedure: BREAST LUMPECTOMY WITH SENTINEL LYMPH NODE BX;  Surgeon: Robert Bellow, MD;  Location: [facility] ORS;  Service: General;  Laterality: Right;   COLONOSCOPY WITH PROPOFOL N/A 01/26/2019    Procedure: COLONOSCOPY WITH PROPOFOL;  Surgeon: Lin Landsman, MD;  Location: [facility] ENDOSCOPY;  Service: Gastroenterology;  Laterality: N/A;   COLONOSCOPY WITH PROPOFOL N/A 02/09/2022   Procedure: COLONOSCOPY WITH PROPOFOL;  Surgeon: Jonathon Bellows, MD;  Location: [facility] ENDOSCOPY;  Service: Gastroenterology;  Laterality: N/A;   FOOT SURGERY Bilateral    heel spurs   JOINT REPLACEMENT     TOTAL SHOULDER REPLACEMENT Right 01/25/2015   Dr. Roland Rack   TOTAL THYROIDECTOMY  08/01/2018    There were no vitals filed for this visit.   Subjective Assessment - 04/26/22 1716     Subjective  I cannot wait to retire at the end of the year.  I am wearing my sleeves every day in the morning to the evening.  But nothing at nighttime over the weekend so I do not wear it as long as I do on the weekdays.  Feels about the same I think my arm.    Pertinent History Dr Tasia Catchings - 03/07/22 ASSESSMENT & PLAN:   1. Malignant 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, estrogen receptor positive ([place])   2. Aromatase inhibitor use   3. Stage 3b  chronic kidney disease ([place])    Cancer Staging   Malignant 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, estrogen receptor positive ([place])  Staging form: Breast, AJCC 8th Edition  - Pathologic stage from 03/20/2021: Stage IA (pT1a, pN0, cM0, G2, ER+, PR+, HER2-) - Signed by Earlie Server, MD on 03/20/2021        Right breast invasive carcinoma.  pT1a pN0, ER positive, PR positive, HER2 negative.  Focal DCIS margin positive.Patient declined reexcision.s/p adjuvant radiation.   Unable to tolerate Arimidex  switched to Aromasin.  Labs reviewed and discussed with patient  Continue Aromasin 54m daily.   01/15/2022  bilateral diagnostic mammogram results were reviewed and discussed with patient.  Continue annual mammogram.  Bone health, 05/16/2021, normal bone density.  Repeat in 2 years.  Recommend calcium 1200 mg daily.  She has a normal baseline bone density.        Chronic kidney  disease, stage III.  Avoid nephrotoxin encourage hydration.   -refer to OT for R UE and thoracic lymphedema    Patient Stated Goals Do not want my arm to get bigger or heavier.    Currently in Pain? No/denies                 LYMPHEDEMA/ONCOLOGY QUESTIONNAIRE - 04/26/22 0001       Right Upper Extremity Lymphedema   15 cm Proximal to Olecranon Process 40.5 cm    10 cm Proximal to Olecranon Process 39 cm    Olecranon Process 30 cm    15 cm Proximal to Ulnar Styloid Process 30.4 cm    10 cm Proximal to Ulnar Styloid Process 26.8 cm    Just Proximal to Ulnar Styloid Process 20.4 cm    Across Hand at [employer]21 cm    At [facility] 2nd Digit 6.5 cm    [facility] 7.1 cm      Left Upper Extremity Lymphedema   15 cm Proximal to Olecranon Process 41.5 cm    10 cm Proximal to Olecranon Process 40 cm    Olecranon Process 30 cm    15 cm Proximal to Ulnar Styloid Process 29.4 cm    10 cm Proximal to Ulnar Styloid Process 25 cm              Patient arrived with a  over-the-counter compression sleeve right upper extremity with glove Patient has been wearing it for about 4 week patient report no problems no numbness no pain. Patient denies wearing any compression at nighttime since last seen.       Circumference of right upper extremity wearing a daytime compression decreased to within normal limits compared to left upper extremity-except forearm still increase but patient is right-hand dominant.  And works in tCharity fundraiser    Educated patient on wearing compression garment correctly as well as donning and doffing. Patient reports not getting back to exercise patient working long hours not retiring until the end of the year. Still fatigue from chemo and radiation therapy reviewed with patient options as well as what she done before diagnosis of breast cancer. Patient did not return to exercise since seen last time -encourage patient again to start back.   Patient to return  in about 2 months assessing if patient is able to maintain progress in decongestion in the right upper extremity with daytime compression only..                 OT Education - 04/26/22  1717     Education Details progress and home program    Person(s) Educated Patient    Methods Explanation;Demonstration;Tactile cues;Verbal cues;Handout    Comprehension Verbal cues required;Returned demonstration;Verbalized understanding                 OT Long Term Goals - 04/26/22 1832       OT LONG TERM GOAL #1   Title Patient to be independent in home program to decrease circumference in the right upper extremity to be fitted for compression garments.    Status Achieved      OT LONG TERM GOAL #2   Title Pulmonary-History, Still This    Status Deferred      OT LONG TERM GOAL #3   Title Patient's right upper extremity decreased in circumference for patient to be fitted with a compression daytimes sleeve and if needed at nighttime to maintain right upper extremity circumference.    Baseline Patient with no compression report increase size of right arm and breast at times per patient right swelling stays in arm.  Upper arm increased 2.4 cm, elbow 1 cm, forearm 2 cm and wrist 1.4 cm  NOW patient was fitted  week ago with daytime compression sleeve, not wearing anything at night we will reassess if patient can contain lymphedema measurements with only daytime compression. -    Time 8    Period Weeks    Status Achieved    Target Date 06/21/22      OT LONG TERM GOAL #4   Title Patient to be independent in home program to decrease tenderness over A1 pulleys of right fourth and left thumb as well as decrease triggering during the day    Baseline No knowledge patient reports several times of triggering during the day as well as pain and tenderness 6/10 over A1    Time 8    Period Weeks    Status New    Target Date 06/21/22                   Plan - 04/26/22 1828      Clinical Impression Statement Patient referred to OT for evaluation for right upper extremity and thoracic/breast lymphedema.  Patient had lumpectomy in April 22, patient reports 5-6 lymph nodes was removed.  Patient had radiation June 22.  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  After survivorship meetings patient self referred her to Dr. For lymphedema screening with this OT.  At evaluation patient showed increase circumference in right upper extremity 2.4 cm in the upper arm, 1 cm at the elbow, 2 cm in forearm and 1.4 cm at wrist compared to the left upper extremity.  Patient is right-hand dominant and was fitted with a daytime compression sleeve  and glove - had not veen wearing any night time compression.  Patient circumference comparing within normal limits with the left except forearm enlarged but patient is also right-hand dominant and could be using that hand a lot in her work in [employer].  Patient did not show any thoracic lymphedema at this stage the last few weeks.  Recommend for patient to continue with daytime compression sleeve and glove for about 2 months and will follow-up.  Patient reports being still feeling fatigue since chemo and radiation not having the motivation to get back to exercise.  Reviewed with patient options and what she done before her breast cancer.  Patient did not return  to exercises yet.  Patient is planning again to try and start back in the next few weeks.  Patient do report left trigger thumb as well as the right fourth digit trigger finger.  Tenderness over A1 pulleys 6/10.  Patient to do contrast in the mornings with tendon glides for stiffness.  Discussed with her enlarging her grips and using larger joints to pick up and grip things.  Patient did do several times during the day ice massage over the A1 pulleys to decrease inflammation.  Fabricated her a MC block splint for right fourth digit to wear at  nighttime or during composite fisting and she can use a cane to aid with padded part in the volar thumb IP on the left.  Patiient can benefit from skilled OT services to decongest right upper extremity lymphedema and decrease  risk for cellulitis and infection.    OT Occupational Profile and History Problem Focused Assessment - Including review of records relating to presenting problem    Occupational performance deficits (Please refer to evaluation for details): ADL's;IADL's;Work;Play;Leisure;Social Participation    Body Structure / Function / Physical Skills ADL;Edema;Flexibility;Pain    Rehab Potential Good    Clinical Decision Making Limited treatment options, no task modification necessary    Comorbidities Affecting Occupational Performance: None    Modification or Assistance to Complete Evaluation  No modification of tasks or assist necessary to complete eval    OT Frequency --   8 wks   OT Duration 8 weeks    OT Treatment/Interventions Self-care/ADL training;Manual lymph drainage;Compression bandaging;DME and/or AE instruction;Manual Therapy;Patient/family education;Therapeutic exercise;Splinting    Consulted and Agree with Plan of Care Patient             Patient will benefit from skilled therapeutic intervention in order to improve the following deficits and impairments:   Body Structure / Function / Physical Skills: ADL, Edema, Flexibility, Pain       Visit Diagnosis: Lymphedema, not elsewhere classified - Plan: Ot plan of care cert/re-cert    Problem List Patient Active Problem List   Diagnosis Date Noted   Genetic testing 03/28/2022   Stage 3b chronic kidney disease ([place]) 03/07/2022   Aromatase inhibitor use 05/30/2021   Complaints of total body pain 05/30/2021   Malignant 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, estrogen receptor positive ([place]) 02/20/2021   Thrombocytosis 11/01/2019   Carpal tunnel syndrome, bilateral 02/19/2019   Trigger finger  of right thumb 02/19/2019   Flexor tenosynovitis of finger 02/19/2019   Severe obesity (BMI 35.0-35.9 with comorbidity) ([place]) 12/29/2018   Mild concentric left ventricular hypertrophy (LVH) 12/24/2017   Mild aortic stenosis by prior echocardiogram 12/24/2017   Mild aortic regurgitation 12/24/2017   CKD (chronic kidney disease), stage I 06/22/2016   Uterine fibroid 01/11/2016   Allergic rhinitis, seasonal 05/24/2015   Benign essential HTN 05/24/2015   Chronic constipation 05/24/2015   Diabetes mellitus with renal manifestation ([place]) 05/24/2015   Dyslipidemia 05/24/2015   Post-surgical hypothyroidism 05/24/2015   Mixed incontinence 05/24/2015   Microalbuminuria 05/24/2015   Menopause 05/24/2015   Obstructive apnea 05/24/2015   Morbid obesity ([place]) 05/24/2015   Hypertensive retinopathy 05/24/2015   Vitamin D deficiency 05/24/2015   Status post total replacement of right shoulder 03/11/2015    Rosalyn Gess, OTR/L,CLT 04/26/2022, 6:37 PM  [place] [facility] [address], [place], [address] Phone: [phone]   Fax:  [phone]  Name: MERSADES BARBARO MRN: [id number] Date  of Birth: 17-Oct-1956

## 2022-05-11 ENCOUNTER — Ambulatory Visit
Admission: RE | Admit: 2022-05-11 | Discharge: 2022-05-11 | Disposition: A | Discharge status: Home / Self Care | Payer: BC Managed Care – PPO | Source: Ambulatory Visit | Attending: Radiation Oncology | Admitting: Radiation Oncology

## 2022-05-11 ENCOUNTER — Encounter: Payer: Self-pay | Admitting: Radiation Oncology

## 2022-05-11 VITALS — BP 137/90 | HR 70 | Temp 97.4°F | Resp 12 | Wt 228.4 lb

## 2022-05-11 DIAGNOSIS — Z923 Personal history of irradiation: Secondary | ICD-10-CM | POA: Insufficient documentation

## 2022-05-11 DIAGNOSIS — Z853 Personal history of malignant neoplasm of breast: Secondary | ICD-10-CM | POA: Diagnosis not present

## 2022-05-11 DIAGNOSIS — C50211 Malignant neoplasm of upper-inner quadrant of right female breast: Secondary | ICD-10-CM

## 2022-05-11 DIAGNOSIS — E890 Postprocedural hypothyroidism: Secondary | ICD-10-CM | POA: Diagnosis not present

## 2022-05-11 DIAGNOSIS — Z08 Encounter for follow-up examination after completed treatment for malignant neoplasm: Secondary | ICD-10-CM | POA: Diagnosis not present

## 2022-05-11 NOTE — Progress Notes (Signed)
Radiation Oncology Follow up Note  Name: Andrea Randolph   Date:   05/11/2022 MRN:  [id number] DOB: Jul 15, 1956    This 66 y.o. female presents to the clinic today for 1 year follow-up status post whole breast radiation for stage Ia ER/PR positive invasive mammary carcinoma.  REFERRING PROVIDER: Steele Sizer, MD  HPI: Patient is a 66 year old female now at 1 year having completed whole breast radiation to her right breast for stage Ia ER/PR positive invasive mammary carcinoma seen today in routine follow-up she is doing well she specifically denies breast tenderness cough or bone pain.  Patient is calm and currently on Aromasin tolerating it well without side effect.  Patient marked mammograms back in March which I have reviewed were BI-RADS 2 benign.  I have also reviewed her recent MRI of the lumbar spine showing grade 1 anterior listhesis and a disc bulge at L1-2.  Patient is wearing a sleeve for lymphedema of her right upper extremity which I question at this time.  She only had 3 sentinel lymph nodes removed and no radiation to her axillary nodes which would make lymphedema extremely unlikely in this patient.  COMPLICATIONS OF TREATMENT: none  FOLLOW UP COMPLIANCE: keeps appointments   PHYSICAL EXAM:  BP 137/90 (BP Location: Left Arm, Patient Position: Sitting, Cuff Size: Large)   Pulse 70   Temp (!) 97.4 F (36.3 C) (Tympanic)   Resp 12   Wt 228 lb 6.4 oz (103.6 kg)   BMI 39.20 kg/m  Lungs are clear to A&P cardiac examination essentially unremarkable with regular rate and rhythm. No dominant mass or nodularity is noted in either breast in 2 positions examined. Incision is well-healed. No axillary or supraclavicular adenopathy is appreciated. Cosmetic result is excellent.  Well-developed well-nourished patient in NAD. HEENT reveals PERLA, EOMI, discs not visualized.  Oral cavity is clear. No oral mucosal lesions are identified. Neck is clear without evidence of cervical or  supraclavicular adenopathy. Lungs are clear to A&P. Cardiac examination is essentially unremarkable with regular rate and rhythm without murmur rub or thrill. Abdomen is benign with no organomegaly or masses noted. Motor sensory and DTR levels are equal and symmetric in the upper and lower extremities. Cranial nerves II through XII are grossly intact. Proprioception is intact. No peripheral adenopathy or edema is identified. No motor or sensory levels are noted. Crude visual fields are within normal range.  RADIOLOGY RESULTS: Mammograms and MRI scans reviewed compatible with above-stated findings  PLAN: Present time patient is doing well with no evidence of disease now at 1 year.  Of asked to see her back in 1 year for follow-up.  I have also questioned her on use of the sleeve.  Highly unlikely she has lymphedema from her limited sentinel node lymph node biopsy.  Patient will address that with her surgeon.  I have again asked her back in 1 year for follow-up.  She comprehends my recommendations well.  I would like to take this opportunity to thank you for allowing me to participate in the care of your patient.Noreene Filbert, MD

## 2022-05-12 LAB — TSH: TSH: 108.21 mIU/L — ABNORMAL HIGH (ref 0.40–4.50)

## 2022-05-14 ENCOUNTER — Other Ambulatory Visit: Payer: Self-pay | Admitting: Oncology

## 2022-05-14 ENCOUNTER — Other Ambulatory Visit: Payer: Self-pay

## 2022-05-14 DIAGNOSIS — E0590 Thyrotoxicosis, unspecified without thyrotoxic crisis or storm: Secondary | ICD-10-CM

## 2022-05-21 ENCOUNTER — Other Ambulatory Visit: Payer: Self-pay | Admitting: Family Medicine

## 2022-05-22 ENCOUNTER — Telehealth: Payer: Self-pay | Admitting: Cardiovascular Disease

## 2022-05-22 NOTE — Telephone Encounter (Signed)
Pt c/o medication issue:  1. Name of Medication: ezetimibe (ZETIA) 10 MG tablet  2. How are you currently taking this medication (dosage and times per day)?   3. Are you having a reaction (difficulty breathing--STAT)?   4. What is your medication issue? Not covered by the insurance. Pharmacy calling to get a different variant of this prescribed.   Ref # [id number]

## 2022-05-25 ENCOUNTER — Ambulatory Visit: Payer: BC Managed Care – PPO | Admitting: Medical

## 2022-05-25 ENCOUNTER — Other Ambulatory Visit: Payer: Self-pay | Admitting: Family Medicine

## 2022-05-25 NOTE — Telephone Encounter (Signed)
Medication Refill - Medication: Rybelsus 14 mg  Wells Guiles at [facility] said they never received the rx back in April  Has the patient contacted their pharmacy? Yes.   (Agent: If no, request that the patient contact the pharmacy for the refill. If patient does not wish to contact the pharmacy document the reason why and proceed with request.) (Agent: If yes, when and what did the pharmacy advise?)  Preferred Pharmacy (with phone number or street name): [facility] [address] Has the patient been seen for an appointment in the last year OR does the patient have an upcoming appointment? y  Agent: Please be advised that RX refills may take up to 3 business days. We ask that you follow-up with your pharmacy.

## 2022-05-25 NOTE — Telephone Encounter (Signed)
Requested medication (s) are due for refill today - provider review   Requested medication (s) are on the active medication list -yes  Future visit scheduled -yes  Last refill: 03/20/22  Notes to clinic: medication not assigned protocol- 2 dosages listed both as historical medications  Requested Prescriptions  Pending Prescriptions Disp Refills   Semaglutide (RYBELSUS) 14 MG TABS 30 tablet     Sig: Take 1 tablet (14 mg total) by mouth daily at 6 (six) AM.     Off-Protocol Failed - 05/25/2022  3:43 PM      Failed - Medication not assigned to a protocol, review manually.      Passed - Valid encounter within last 12 months    Recent Outpatient Visits           1 month ago Chest pain, unspecified type   [facility] Serafina Royals F, FNP   2 months ago Dyslipidemia due to type 2 diabetes mellitus [facility])   [facility] Steele Sizer, MD   4 months ago Well adult exam   [facility] Steele Sizer, MD   5 months ago Sciatica of right side   [facility] [place], Malachy Mood, NP   5 months ago Type 2 diabetes mellitus with stage 3a chronic kidney disease, without long-term current use of insulin [place])   [facility] Steele Sizer, MD       Future Appointments             In 1 week Furth, Cadence H, PA-C [place], [facility]   In 1 month [place], [place], MD [facility], [place]   In 7 months Steele Sizer, MD [facility], [facility]               Requested Prescriptions  Pending Prescriptions Disp Refills   Semaglutide (RYBELSUS) 14 MG TABS 30 tablet     Sig: Take 1 tablet (14 mg total) by mouth daily at 6 (six) AM.     Off-Protocol Failed - 05/25/2022  3:43 PM      Failed - Medication not assigned to a protocol, review manually.      Passed - Valid encounter within last 12 months    Recent Outpatient Visits           1 month  ago Chest pain, unspecified type   [facility] Serafina Royals F, FNP   2 months ago Dyslipidemia due to type 2 diabetes mellitus [facility])   [facility] Steele Sizer, MD   4 months ago Well adult exam   [facility] Steele Sizer, MD   5 months ago Sciatica of right side   [facility] [place], Malachy Mood, NP   5 months ago Type 2 diabetes mellitus with stage 3a chronic kidney disease, without long-term current use of insulin [facility])   [facility] Steele Sizer, MD       Future Appointments             In 1 week Furth, Cadence H, PA-C [place], [facility]   In 1 month Steele Sizer, MD [facility], [place]   In 7 months Steele Sizer, MD [facility], [facility]

## 2022-05-28 MED ORDER — RYBELSUS 14 MG PO TABS: 1.0000 | ORAL_TABLET | Freq: Every day | ORAL | 0 refills | Status: DC

## 2022-06-04 ENCOUNTER — Ambulatory Visit: Payer: BC Managed Care – PPO | Admitting: Medical

## 2022-06-04 ENCOUNTER — Encounter: Payer: Self-pay | Admitting: Medical

## 2022-06-04 VITALS — BP 154/90 | HR 71 | Ht 64.0 in | Wt 226.0 lb

## 2022-06-04 DIAGNOSIS — I10 Essential (primary) hypertension: Secondary | ICD-10-CM

## 2022-06-04 DIAGNOSIS — I2510 Atherosclerotic heart disease of native coronary artery without angina pectoris: Secondary | ICD-10-CM

## 2022-06-04 DIAGNOSIS — I351 Nonrheumatic aortic (valve) insufficiency: Secondary | ICD-10-CM

## 2022-06-04 DIAGNOSIS — I350 Nonrheumatic aortic (valve) stenosis: Secondary | ICD-10-CM

## 2022-06-04 DIAGNOSIS — I2584 Coronary atherosclerosis due to calcified coronary lesion: Secondary | ICD-10-CM

## 2022-06-04 DIAGNOSIS — I517 Cardiomegaly: Secondary | ICD-10-CM

## 2022-06-04 NOTE — Progress Notes (Signed)
Cardiology Office Note:    Date:  06/04/2022   ID:  Andrea Randolph, DOB Jun 21, 1956, MRN [id number]  PCP:  Steele Sizer, MD  [facility] Cardiologist:  Ida Rogue, MD  [place] Electrophysiologist:  None   Referring MD: Steele Sizer, MD   Chief Complaint: 6 week follow-up  History of Present Illness:    Andrea Randolph is a 66 y.o. female with a hx of  HTN, hypothyroidism, OSA, HLD, DM2, obesity, mild AI by echo 02/2018, mild LVH by echo 02/2018, Breast cancer s/p lumpectomy presents today for ER follow up.   Previous cardiac workup includes echo 2012 with EF 55-60%, mild AS/AI. Repeat echocardiogram 02/2018 with LVEF 60-65%, mild LVH, normal diastolic parameters, no RWMA, mild aortic regurgitation without aortic stenosis.    She has had difficulty with myalgias while taking statins.    Last seen 06/2021 and was overall doing well from a cardiac perspective.    ER visit 04/02/22 for chest pain. BP was high. HS trop 47>48. CT chest was negative for PE.   She was seen in follow-up 03/31/22 and Cardiac CTA was ordered. This showed coronary calcium score 103, 83rd percentile for age and sex matched, mild LAD stenosis, minimal PDA stenosis, overall mild nonobstructive CAD 25-49%.   Today, the Cardiac CTA was reviewed. The patient denies further chest pain or shortness of breath. Said the Protonix was affecting thyroid, so this was discontinued. No LLE, orthopnea, pnd.     Past Medical History:  Diagnosis Date   Allergy    Anemia    Arthritis of right shoulder region    Dr. Tamala Julian   Cancer [facility])    breast cancer right side   Chronic insomnia    Diabetes mellitus without complication (HCC)    Family history of adverse reaction to anesthesia    sister with Nausea   GERD (gastroesophageal reflux disease)    Goiter    Dr. Gabriel Carina   Heart murmur    Hyperlipidemia    Hypertension    Hypertensive retinopathy of left eye    Hypothyroidism, adult    Dr. Gabriel Carina   Inflammation of  urethra    Menopause    Microalbuminuria    DM   Mixed incontinence    Muscle cramps    Obesity    OSA (obstructive sleep apnea)    Personal history of radiation therapy    Breast Cancer Right breast   Radiculitis, lumbosacral    Vitamin D deficiency     Past Surgical History:  Procedure Laterality Date   BREAST BIOPSY Left 90's   benign core needle    BREAST BIOPSY Right 02/07/2021   [place] Bx, Q-clip, path pending    BREAST LUMPECTOMY Right 03/06/2021   BREAST LUMPECTOMY WITH SENTINEL LYMPH NODE BIOPSY Right 03/06/2021   Procedure: BREAST LUMPECTOMY WITH SENTINEL LYMPH NODE BX;  Surgeon: Robert Bellow, MD;  Location: [facility] ORS;  Service: General;  Laterality: Right;   COLONOSCOPY WITH PROPOFOL N/A 01/26/2019   Procedure: COLONOSCOPY WITH PROPOFOL;  Surgeon: Lin Landsman, MD;  Location: [facility] ENDOSCOPY;  Service: Gastroenterology;  Laterality: N/A;   COLONOSCOPY WITH PROPOFOL N/A 02/09/2022   Procedure: COLONOSCOPY WITH PROPOFOL;  Surgeon: Jonathon Bellows, MD;  Location: [facility] ENDOSCOPY;  Service: Gastroenterology;  Laterality: N/A;   FOOT SURGERY Bilateral    heel spurs   JOINT REPLACEMENT     TOTAL SHOULDER REPLACEMENT Right 01/25/2015   Dr. Roland Rack   TOTAL THYROIDECTOMY  08/01/2018  Current Medications: Current Meds  Medication Sig   Alirocumab (PRALUENT) 75 MG/ML SOAJ Inject 75 mg into the skin every 14 (fourteen) days.   aspirin 81 MG tablet Take 81 mg by mouth daily.   Calcium Carb-Cholecalciferol (CALCIUM 500+D3 PO) Take 2 tablets by mouth daily.   Cholecalciferol (VITAMIN D3) 1000 units CAPS Take 1,000 Units by mouth daily.   dapagliflozin propanediol (FARXIGA) 10 MG TABS tablet Take 1 tablet (10 mg total) by mouth daily.   diclofenac Sodium (VOLTAREN) 1 % GEL Apply 2 g topically 4 (four) times daily as needed.   exemestane (AROMASIN) 25 MG tablet TAKE 1 TABLET DAILY   ezetimibe (ZETIA) 10 MG tablet Take 1 tablet (10 mg total) by mouth daily.   glucose blood  test strip    ketoconazole (NIZORAL) 2 % cream Apply 1 application topically daily.   levothyroxine (SYNTHROID) 125 MCG tablet Take 1 tablet (125 mcg total) by mouth daily.   metoprolol tartrate (LOPRESSOR) 100 MG tablet Take 1 tablet (100 mg total) by mouth once for 1 dose. TAKE 2 hours prior to your test.   Olmesartan-amLODIPine-HCTZ 40-5-25 MG TABS Take 1 tablet by mouth daily at 12 noon.   Semaglutide (RYBELSUS) 14 MG TABS Take 1 tablet (14 mg total) by mouth daily at 6 (six) AM.     Allergies:   Pantoprazole and Rosuvastatin   Social History   Socioeconomic History   Marital status: Legally Separated    Spouse name: Not on file   Number of children: 1   Years of education: Not on file   Highest education level: 12th grade  Occupational History   Occupation: Cytogeneticist  Tobacco Use   Smoking status: Never   Smokeless tobacco: Never  Vaping Use   Vaping Use: Never used  Substance and Sexual Activity   Alcohol use: No    Alcohol/week: 0.0 standard drinks of alcohol   Drug use: No   Sexual activity: Not Currently    Birth control/protection: Post-menopausal  Other Topics Concern   Not on file  Social History Narrative   Separated in around 2009.   She was living with her daughter, however currently moved in with her sister and nephew , but is looking for a house.    Social Determinants of Health   Financial Resource Strain: Low Risk  (01/11/2022)   Overall Financial Resource Strain (CARDIA)    Difficulty of Paying Living Expenses: Not hard at all  Food Insecurity: No Food Insecurity (01/11/2022)   Hunger Vital Sign    Worried About Running Out of Food in the Last Year: Never true    Ran Out of Food in the Last Year: Never true  Transportation Needs: No Transportation Needs (01/11/2022)   PRAPARE - Hydrologist (Medical): No    Lack of Transportation (Non-Medical): No  Physical Activity: Inactive (01/11/2022)   Exercise Vital Sign     Days of Exercise per Week: 0 days    Minutes of Exercise per Session: 0 min  Stress: No Stress Concern Present (01/11/2022)   [place]    Feeling of Stress : Only a little  Social Connections: Moderately Integrated (01/11/2022)   Social Connection and Isolation Panel [NHANES]    Frequency of Communication with Friends and Family: More than three times a week    Frequency of Social Gatherings with Friends and Family: More than three times a week    Attends Religious  Services: 1 to 4 times per year    Active Member of Clubs or Organizations: Yes    Attends Archivist Meetings: 1 to 4 times per year    Marital Status: Separated     Family History: The patient's family history includes Breast cancer (age of onset: 101) in her maternal aunt; CVA in her brother; Diabetes in her sister; Heart disease in her father and mother; Hypertension in her brother, sister, and sister; Kidney disease in her sister; Kidney failure in her sister. There is no history of Ovarian cancer or Colon cancer.  ROS:   Please see the history of present illness.     All other systems reviewed and are negative.  EKGs/Labs/Other Studies Reviewed:    The following studies were reviewed today:  Cardiac CTA 04/2022 IMPRESSION: 1. Coronary calcium score of 103. This was 83rd percentile for age and sex matched control.   2. Normal coronary origin with right dominance.   3. Calcified plaque causing mild proximal LAD stenosis (25%).   4. Calcified plaque causing minimal ostial PDA stenosis (<25%).   5. CAD-RADS 2. Mild non-obstructive CAD (25-49%). Consider non-atherosclerotic causes of chest pain. Consider preventive therapy and risk factor modification.   Electronically Signed: By: Kate Sable M.D. On: 04/19/2022 14:29  Echo 2019 Study Conclusions   - Left ventricle: The cavity size was normal. There was mild    concentric  hypertrophy. Systolic function was normal. The    estimated ejection fraction was in the range of 60% to 65%. Wall    motion was normal; there were no regional wall motion    abnormalities. Left ventricular diastolic function parameters    were normal.  - Aortic valve: Transvalvular velocity was within the normal range.    There was no stenosis. There was mild regurgitation.   Impressions:   - Mild LVOT gradient with a peak gradient of 30 mm Hg.   EKG:  EKG is not ordered today.    Recent Labs: 03/07/2022: ALT 15 04/02/2022: BUN 14; Creatinine, Ser 1.27; Hemoglobin 14.9; Platelets 372; Potassium 3.5; Sodium 139 05/11/2022: TSH 108.21  Recent Lipid Panel    Component Value Date/Time   CHOL 158 03/07/2022 0910   CHOL 174 12/21/2015 0856   TRIG 62 03/07/2022 0910   HDL 47 (L) 03/07/2022 0910   HDL 60 12/21/2015 0856   CHOLHDL 3.4 03/07/2022 0910   VLDL 9 12/28/2016 1209   LDLCALC 97 03/07/2022 0910     Physical Exam:    VS:  BP (!) 154/90 (BP Location: Left Arm, Patient Position: Sitting, Cuff Size: Large)   Pulse 71   Ht '5\' 4"'$  (1.626 m)   Wt 226 lb (102.5 kg)   SpO2 96%   BMI 38.79 kg/m     Wt Readings from Last 3 Encounters:  06/04/22 226 lb (102.5 kg)  05/11/22 228 lb 6.4 oz (103.6 kg)  04/10/22 222 lb 2 oz (100.8 kg)     GEN:  Well nourished, well developed in no acute distress HEENT: Normal NECK: No JVD; No carotid bruits LYMPHATICS: No lymphadenopathy CARDIAC: RRR, no murmurs, rubs, gallops RESPIRATORY:  Clear to auscultation without rales, wheezing or rhonchi  ABDOMEN: Soft, non-tender, non-distended MUSCULOSKELETAL:  No edema; No deformity  SKIN: Warm and dry NEUROLOGIC:  Alert and oriented x 3 PSYCHIATRIC:  Normal affect   ASSESSMENT:    1. Coronary artery calcification   2. Essential hypertension   3. Mild aortic regurgitation   4. Mild aortic  stenosis by prior echocardiogram   5. Mild concentric left ventricular hypertrophy (LVH)    PLAN:     In order of problems listed above:  Nonobstructive CAD Recent Cardiac CTA showed calcium score of 108, 63rd percentile for age and sex matched, and otherwise mild nonobstructive CAD. The patient denies further chest pain or SOB. Continue Aspirin, Praluent, Zetia and BB therapy. No further ischemic work-up indicated.   Mild LVOT Echo in 2019 showed LVEF 60-65%, mild LVH with LVOT 75mHg. Repeat echo today  Mild AI Echo in 2019 showed mild AI. Euvolemic on exam. Repeat echo as above.   DM2 A1C 6.7. Followed by PCP.   HTN BP is higher, however it's generally better at home. Continue current medications.   Disposition: Follow up in 6 month(s) with MD/APP     Signed, Kaedyn Polivka HNinfa Meeker PA-C  06/04/2022 11:13 AM    [facility]

## 2022-06-04 NOTE — Patient Instructions (Addendum)
Medication Instructions:  - Your physician recommends that you continue on your current medications as directed. Please refer to the Current Medication list given to you today.  *If you need a refill on your cardiac medications before your next appointment, please call your pharmacy*   Lab Work: - none ordered  If you have labs (blood work) drawn today and your tests are completely normal, you will receive your results only by: [place] (if you have MyChart) OR A paper copy in the mail If you have any lab test that is abnormal or we need to change your treatment, we will call you to review the results.   Testing/Procedures:  1) Echocardiogram: - Your physician has requested that you have an echocardiogram. Echocardiography is a painless test that uses sound waves to create images of your heart. It provides your doctor with information about the size and shape of your heart and how well your heart's chambers and valves are working. This procedure takes approximately one hour. There are no restrictions for this procedure. There is a possibility that an IV may need to be started during your test to inject an image enhancing agent. This is done to obtain more optimal pictures of your heart. Therefore we ask that you do at least drink some water prior to coming in to hydrate your veins.     Follow-Up: At [facility], you and your health needs are our priority.  As part of our continuing mission to provide you with exceptional heart care, we have created designated Provider Care Teams.  These Care Teams include your primary Cardiologist (physician) and Advanced Practice Providers (APPs -  Physician Assistants and Nurse Practitioners) who all work together to provide you with the care you need, when you need it.  We recommend signing up for the patient portal called "MyChart".  Sign up information is provided on this After Visit Summary.  MyChart is used to connect with patients for  Virtual Visits (Telemedicine).  Patients are able to view lab/test results, encounter notes, upcoming appointments, etc.  Non-urgent messages can be sent to your provider as well.   To learn more about what you can do with MyChart, go to [email or web address].    Your next appointment:   6 month(s)  The format for your next appointment:   In Person  Provider:   You may see Ida Rogue, MD or one of the following Advanced Practice Providers on your designated Care Team:    Cadence Kathlen Mody, [place]    Other Instructions N/a   Important Information About Sugar

## 2022-06-06 NOTE — Telephone Encounter (Signed)
Furth, Cadence H, PA-C  You 5 days ago    Not sure why I'm just seeing this , but I think I was just refilling zetia. Ok if she doesn't take zetia     Patient made aware of CF response and recommendation. Pt med list updated.

## 2022-06-08 ENCOUNTER — Other Ambulatory Visit: Payer: BC Managed Care – PPO

## 2022-06-18 ENCOUNTER — Other Ambulatory Visit: Payer: Self-pay | Admitting: Family Medicine

## 2022-06-18 DIAGNOSIS — E785 Hyperlipidemia, unspecified: Secondary | ICD-10-CM

## 2022-06-18 DIAGNOSIS — I517 Cardiomegaly: Secondary | ICD-10-CM

## 2022-06-18 DIAGNOSIS — G720 Drug-induced myopathy: Secondary | ICD-10-CM

## 2022-06-21 ENCOUNTER — Ambulatory Visit: Payer: BC Managed Care – PPO | Attending: Oncology | Admitting: Occupational Therapy

## 2022-06-21 DIAGNOSIS — I890 Lymphedema, not elsewhere classified: Secondary | ICD-10-CM | POA: Diagnosis not present

## 2022-06-21 NOTE — Therapy (Signed)
[facility] [address], [place], [address] Phone: [phone]   Fax:  [phone]  Occupational Therapy Treatment  Patient Details  Name: Andrea Randolph MRN: [id number] Date of Birth: [birth year] Referring Provider (OT): Dr. Tasia Catchings   Encounter Date: 06/21/2022   OT End of Session - 06/21/22 1741     Visit Number 5    Number of Visits 5    Date for OT Re-Evaluation 06/21/22    OT Start Time 1627    OT Stop Time 1701    OT Time Calculation (min) 34 min    Activity Tolerance Patient tolerated treatment well    Behavior During Therapy [facility] for tasks assessed/performed             Past Medical History:  Diagnosis Date   Allergy    Anemia    Arthritis of right shoulder region    Dr. Tamala Julian   Cancer [facility])    breast cancer right side   Chronic insomnia    Diabetes mellitus without complication ([place])    Family history of adverse reaction to anesthesia    sister with Nausea   GERD (gastroesophageal reflux disease)    Goiter    Dr. Gabriel Carina   Heart murmur    Hyperlipidemia    Hypertension    Hypertensive retinopathy of left eye    Hypothyroidism, adult    Dr. Gabriel Carina   Inflammation of urethra    Menopause    Microalbuminuria    DM   Mixed incontinence    Muscle cramps    Obesity    OSA (obstructive sleep apnea)    Personal history of radiation therapy    Breast Cancer Right breast   Radiculitis, lumbosacral    Vitamin D deficiency     Past Surgical History:  Procedure Laterality Date   BREAST BIOPSY Left 90's   benign core needle    BREAST BIOPSY Right 02/07/2021   [place] Bx, Q-clip, path pending    BREAST LUMPECTOMY Right 03/06/2021   BREAST LUMPECTOMY WITH SENTINEL LYMPH NODE BIOPSY Right 03/06/2021   Procedure: BREAST LUMPECTOMY WITH SENTINEL LYMPH NODE BX;  Surgeon: Robert Bellow, MD;  Location: [facility] ORS;  Service: General;  Laterality: Right;   COLONOSCOPY WITH PROPOFOL N/A 01/26/2019    Procedure: COLONOSCOPY WITH PROPOFOL;  Surgeon: Lin Landsman, MD;  Location: [facility] ENDOSCOPY;  Service: Gastroenterology;  Laterality: N/A;   COLONOSCOPY WITH PROPOFOL N/A 02/09/2022   Procedure: COLONOSCOPY WITH PROPOFOL;  Surgeon: Jonathon Bellows, MD;  Location: [facility] ENDOSCOPY;  Service: Gastroenterology;  Laterality: N/A;   FOOT SURGERY Bilateral    heel spurs   JOINT REPLACEMENT     TOTAL SHOULDER REPLACEMENT Right 01/25/2015   Dr. Roland Rack   TOTAL THYROIDECTOMY  08/01/2018    There were no vitals filed for this visit.   Subjective Assessment - 06/21/22 1724     Subjective  I am really looking forward to retiring in 4 months.  I am wearing my sleeves every day at work.  Not on the weekends.  Not wearing anything at nighttime.  But this ring finger is really bothering me..  Painful and is locking.  Can you help me with that because I do not want to have cortisone shot    Pertinent History Dr Tasia Catchings - 03/07/22 ASSESSMENT & PLAN:   1. Malignant 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, estrogen receptor positive ([place])   2. Aromatase inhibitor use  3. Stage 3b chronic kidney disease ([place])    Cancer Staging   Malignant 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, estrogen receptor positive ([place])  Staging form: Breast, AJCC 8th Edition  - Pathologic stage from 03/20/2021: Stage IA (pT1a, pN0, cM0, G2, ER+, PR+, HER2-) - Signed by Earlie Server, MD on 03/20/2021        Right breast invasive carcinoma.  pT1a pN0, ER positive, PR positive, HER2 negative.  Focal DCIS margin positive.Patient declined reexcision.s/p adjuvant radiation.   Unable to tolerate Arimidex  switched to Aromasin.  Labs reviewed and discussed with patient  Continue Aromasin 50m daily.   01/15/2022  bilateral diagnostic mammogram results were reviewed and discussed with patient.  Continue annual mammogram.  Bone health, 05/16/2021, normal bone density.  Repeat in 2 years.  Recommend calcium 1200 mg daily.  She has a  normal baseline bone density.        Chronic kidney disease, stage III.  Avoid nephrotoxin encourage hydration.   -refer to OT for R UE and thoracic lymphedema    Patient Stated Goals Do not want my arm to get bigger or heavier.    Currently in Pain? Yes    Pain Score 8     Pain Location Finger (Comment which one)    Pain Orientation Right    Pain Descriptors / Indicators Aching;Tender;Tightness    Pain Type Acute pain    Pain Frequency Constant    Aggravating Factors  making fist - trigger finger                 LYMPHEDEMA/ONCOLOGY QUESTIONNAIRE - 06/21/22 0001       Right Upper Extremity Lymphedema   15 cm Proximal to Olecranon Process 40.4 cm    10 cm Proximal to Olecranon Process 40.4 cm    Olecranon Process 30 cm    15 cm Proximal to Ulnar Styloid Process 30.2 cm    10 cm Proximal to Ulnar Styloid Process 26.5 cm    Just Proximal to Ulnar Styloid Process 19.4 cm    Across Hand at [employer]20.5 cm      Left Upper Extremity Lymphedema   15 cm Proximal to Olecranon Process 40.5 cm    10 cm Proximal to Olecranon Process 39 cm    Olecranon Process 30.2 cm    15 cm Proximal to Ulnar Styloid Process 29 cm    10 cm Proximal to Ulnar Styloid Process 25 cm                  Patient arrived with a  over-the-counter compression sleeve right upper extremity with glove Patient has been wearing since May - patient report no problems no numbness no pain. 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 Patient doing very well will in January 24 reassess if patient can only wear with high risk activity after retirement. Patient to report  increased pain and tenderness over right fourth digit A1 pulley.  8/10.  Patient showed decreased active range of motion of fourth digit as well as locking and reporting. Patient to discuss with family doctor early September for possible referral for hand therapy to treat  patient with iontophoresis with dexamethasone.  Patient did not want cortisone shot.  Patient to call me if needed to be seen.              OT Education - 06/21/22 1741     Education Details progress and home program    Person(s) Educated Patient    Methods Explanation;Demonstration;Tactile cues;Verbal cues;Handout    Comprehension Verbal cues required;Returned demonstration;Verbalized understanding                 OT Long Term Goals - 06/21/22 1747       OT LONG TERM GOAL #1   Title Patient to be independent in home program to decrease circumference in the right upper extremity to be fitted for compression garments.    Status Achieved      OT LONG TERM GOAL #2   Title Patient to monitor her for thoracic lymphedema    Baseline No signs and symptoms last few visits about thoracic lymphedema on the right side    Status Achieved      OT LONG TERM GOAL #3   Title Patient's right upper extremity decreased in circumference for patient to be fitted with a compression daytimes sleeve and if needed at nighttime to maintain right upper extremity circumference.    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  Patient do not need nighttime compression.    Status Achieved      OT LONG TERM GOAL #4   Title Patient to be independent in home program to decrease tenderness over A1 pulleys of right fourth and left thumb as well as decrease triggering during the day    Baseline Patient to contact family doctor about referral for OT to eval and treat    Status Deferred                   Plan - 06/21/22 1742     Clinical Impression Statement  Patient referred to OT for evaluation for right upper extremity and thoracic/breast lymphedema.  Patient had lumpectomy in April 22, patient reports 5-6 lymph nodes was removed.  Patient had radiation June 22.  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  After survivorship meetings patient self referred her to Dr. For lymphedema screening with this OT.  At evaluation patient showed increase circumference in right upper extremity 2.4 cm in the upper arm, 1 cm at the elbow, 2 cm in forearm and 1.4 cm at wrist compared to the left upper extremity.  Patient is right-hand dominant and was fitted with a daytime compression sleeve  and glove - had not wearing any night time compression.  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  Would recommend at this stage because of stage I lymphedema patient to wear it until retirement and then will reassess January 24 if patient just needed for high risk activity.  At this time no signs and symptoms of thoracic lymphedema.  Patient to continue to monitor.  Patient do report left trigger thumb as well as the right fourth digit trigger finger.  Since last visit patient report right fourth digit trigger finger.  Patient this date with tenderness over the A1 pulley as well as decreased flexion and increased pain.  Recommend for patient to discuss it with her family doctor.  Patient  do not want a cortisone shot patient can ask family physician to refer her to hand therapy for iontophoresis with dexamethasone and treatment for trigger finger.  Patient to contact me if need to be evaluated.  Patient to follow-up with me in January 24 for reassessment of right upper extremity lymphedema..    OT Occupational Profile and History Problem Focused Assessment - Including review of records relating to presenting problem     Occupational performance deficits (Please refer to evaluation for details): ADL's;IADL's;Work;Play;Leisure;Social Participation    Body Structure / Function / Physical Skills ADL;Edema;Flexibility;Pain    Rehab Potential Good    Comorbidities Affecting Occupational Performance: None    Modification or Assistance to Complete Evaluation  No modification of tasks or assist necessary to complete eval    OT Frequency 1x / week    OT Duration --   1 wks   OT Treatment/Interventions Self-care/ADL training;Manual lymph drainage;Compression bandaging;DME and/or AE instruction;Manual Therapy;Patient/family education;Therapeutic exercise;Splinting    Consulted and Agree with Plan of Care Patient             Patient will benefit from skilled therapeutic intervention in order to improve the following deficits and impairments:   Body Structure / Function / Physical Skills: ADL, Edema, Flexibility, Pain       Visit Diagnosis: Lymphedema, not elsewhere classified    Problem List Patient Active Problem List   Diagnosis Date Noted   Genetic testing 03/28/2022   Stage 3b chronic kidney disease ([place]) 03/07/2022   Aromatase inhibitor use 05/30/2021   Complaints of total body pain 05/30/2021   Malignant 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, estrogen receptor positive ([place]) 02/20/2021   Thrombocytosis 11/01/2019   Carpal tunnel syndrome, bilateral 02/19/2019   Trigger finger of right thumb 02/19/2019   Flexor tenosynovitis of finger 02/19/2019   Severe obesity (BMI 35.0-35.9 with comorbidity) ([place]) 12/29/2018   Mild concentric left ventricular hypertrophy (LVH) 12/24/2017   Mild aortic stenosis by prior echocardiogram 12/24/2017   Mild aortic regurgitation 12/24/2017   CKD (chronic kidney disease), stage I 06/22/2016   Uterine fibroid 01/11/2016   Allergic rhinitis, seasonal 05/24/2015   Benign essential HTN 05/24/2015   Chronic constipation 05/24/2015   Diabetes  mellitus with renal manifestation ([place]) 05/24/2015   Dyslipidemia 05/24/2015   Post-surgical hypothyroidism 05/24/2015   Mixed incontinence 05/24/2015   Microalbuminuria 05/24/2015   Menopause 05/24/2015   Obstructive apnea 05/24/2015   Morbid obesity ([place]) 05/24/2015   Hypertensive retinopathy 05/24/2015   Vitamin D deficiency 05/24/2015   Status post total replacement of right shoulder 03/11/2015    Rosalyn Gess, OTR/L,CLT  06/21/2022, 5:49 PM  [facility] [address] [place], [address] Phone: [phone]   Fax:  [phone]  Name: NAZARIA RIESEN MRN: [id number] Date of Birth: 12-12-1955

## 2022-06-25 DIAGNOSIS — E0590 Thyrotoxicosis, unspecified without thyrotoxic crisis or storm: Secondary | ICD-10-CM | POA: Diagnosis not present

## 2022-06-26 LAB — TSH: TSH: 1.49 mIU/L (ref 0.40–4.50)

## 2022-07-06 ENCOUNTER — Ambulatory Visit: Payer: BC Managed Care – PPO | Admitting: Family Medicine

## 2022-07-09 ENCOUNTER — Ambulatory Visit: Payer: Self-pay

## 2022-07-09 NOTE — Patient Outreach (Signed)
  Care Coordination   07/09/2022 Name: Andrea Randolph MRN: [id number] DOB: 04-Nov-1956   Care Coordination Outreach Attempts:  An unsuccessful telephone outreach was attempted today to offer the patient information about available care coordination services as a benefit of their health plan.   Follow Up Plan:  Additional outreach attempts will be made to offer the patient care coordination information and services.   Encounter Outcome:  No Answer  Care Coordination Interventions Activated:  No   Care Coordination Interventions:  No, not indicated    Noreene Larsson RN, MSN, CCM Community Care Coordinator [facility] Mobile: [phone]

## 2022-07-13 NOTE — Progress Notes (Unsigned)
Name: Andrea Randolph   MRN: [id number]    DOB: [birth year]   Date:07/17/2022       Progress Note  Subjective  Chief Complaint  Follow Up  HPI  DMII: she is currently taking Farxiga and Rybelsus, off Metformin . Last A1C was 6.4 %, 6.7 % and today is 6.6 %   She has dyslipidemia, HTN , CKI and obesity. She is on ARB, and Zetia started on Praluent since Feb 2023   She denies polyphagia, polydipsia or polyuria. She states statin therapy caused myalgia She has noticed some neuropathy on both heels - started about one month ago. Discussed diabetic neuropathy - she already gabapentin at home ( given by Dr. Garen Grams for her back) she has not been taking it for months. Resume at night with one in the evening and another dose at bed time if needed.   CKI stage III: last GFR was 56 , she has good urine output, no pruritis. . She is on ARB and SGL-2 agonist   LVH and aortic stenosis: under care of Dr. Rockey Situ, no chest pain, palpitation or sob, she denies orthopnea She is on Farxiga , she is off Zetia, but explained she needs LDL below 70  Vitamin D low: she is now taking 2000 units daily, continue supplementation    Surgical hypothyroidism: she has been taking levothyroxine 125 mcg  her level in April was over 100 - she had started taking PPI after a visit to the [facility] with chest pain, since she stopped taking it , TSH is back to normal   Morbid obesity: BMI above 35 with co-morbidities. She has DM, HTN, dyslipidemia and OA. Weight is down a couple of pounds since last visit    Radicular lumbosacral pain: under the care of Dr. Phyllis Ginger , radiating down right leg. She had some steroid injections and it helped with the leg pain but back pain is worse. She stopped gabapentin since symptoms improved all around however she continues to have numbness on both legs when she goes grocerie shopping or staying active right after work   OA/Knee bilateral: saw Dr. Tamala Julian 09/22 and had steroid injection , pain is down to 1/10  on left knee, using Voltaren gel now since she got it from [facility]. Unchanged   Generalized arthralgias and myalgias: she noticed it after starting Arimidex earlier this year, she is now on Aromasin and also off statin therapy ( on hold per Dr. Rockey Situ ) pain is not as severe now but she is still stiff when she gets up in the mornings She had positive ANA, slightly elevated CK we referred her to Rheumatologist . Dr. Posey Pronto saw her January 23 and thinks pain related to DDD lumbar spine and advised stretching exercises and PT, abnormal labs may be secondary to thyroid disease . She went back in May and Dr. Posey Pronto told her to stop taking gabapentin since patient felt it was not helping with the joint pain, however now her neuropathy is worse, so she will resume medication    Hyperlipidemia: she was Pravastatin  and Zetia . Recently off statin therapy due to muscle aches and elevated CK, last LDL over 180, now on zetia and praluent and we will recheck labs today    OSA: she does not tolerate CPAP machine. Stable    Breast Cancer right : diagnosed by abnormal mammogram back in March 22, lumpectomy was done 03/06/2021 followed by 5 weeks of radiation and finished 05/03/2021. She was doing well until she  started on Arimidex, she states her entire body hurts, joint are stiff and aching, difficulty sleeping, change in bowel movement, increase in appetite. She is now off Arimidex and on Aromasin 25 mg still feels stiff and not able to go to the gym, but weight is now trending down She has lymphedema right arm and is wearing a compression sleeve and hand  Trigger finger right ring finger; painful in the mornings, discussed referral to Ortho, but she wants to try ice and use splinter at night   Left thumb tendinopathy: improving by itself, advised ice and thumb spica  Patient Active Problem List   Diagnosis Date Noted   Genetic testing 03/28/2022   Stage 3b chronic kidney disease ([place]) 03/07/2022   Aromatase  inhibitor use 05/30/2021   Complaints of total body pain 05/30/2021   Malignant 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, estrogen receptor positive ([place]) 02/20/2021   Thrombocytosis 11/01/2019   Carpal tunnel syndrome, bilateral 02/19/2019   Trigger finger of right thumb 02/19/2019   Flexor tenosynovitis of finger 02/19/2019   Severe obesity (BMI 35.0-35.9 with comorbidity) ([place]) 12/29/2018   Mild concentric left ventricular hypertrophy (LVH) 12/24/2017   Mild aortic stenosis by prior echocardiogram 12/24/2017   Mild aortic regurgitation 12/24/2017   CKD (chronic kidney disease), stage I 06/22/2016   Uterine fibroid 01/11/2016   Allergic rhinitis, seasonal 05/24/2015   Benign essential HTN 05/24/2015   Chronic constipation 05/24/2015   Diabetes mellitus with renal manifestation ([place]) 05/24/2015   Dyslipidemia 05/24/2015   Post-surgical hypothyroidism 05/24/2015   Mixed incontinence 05/24/2015   Microalbuminuria 05/24/2015   Menopause 05/24/2015   Obstructive apnea 05/24/2015   Morbid obesity ([place]) 05/24/2015   Hypertensive retinopathy 05/24/2015   Vitamin D deficiency 05/24/2015   Status post total replacement of right shoulder 03/11/2015    Past Surgical History:  Procedure Laterality Date   BREAST BIOPSY Left 90's   benign core needle    BREAST BIOPSY Right 02/07/2021   [place] Bx, Q-clip, path pending    BREAST LUMPECTOMY Right 03/06/2021   BREAST LUMPECTOMY WITH SENTINEL LYMPH NODE BIOPSY Right 03/06/2021   Procedure: BREAST LUMPECTOMY WITH SENTINEL LYMPH NODE BX;  Surgeon: Robert Bellow, MD;  Location: [facility] ORS;  Service: General;  Laterality: Right;   COLONOSCOPY WITH PROPOFOL N/A 01/26/2019   Procedure: COLONOSCOPY WITH PROPOFOL;  Surgeon: Lin Landsman, MD;  Location: [facility] ENDOSCOPY;  Service: Gastroenterology;  Laterality: N/A;   COLONOSCOPY WITH PROPOFOL N/A 02/09/2022   Procedure: COLONOSCOPY WITH PROPOFOL;  Surgeon: Jonathon Bellows, MD;   Location: [facility] ENDOSCOPY;  Service: Gastroenterology;  Laterality: N/A;   FOOT SURGERY Bilateral    heel spurs   JOINT REPLACEMENT     TOTAL SHOULDER REPLACEMENT Right 01/25/2015   Dr. Roland Rack   TOTAL THYROIDECTOMY  08/01/2018    Family History  Problem Relation Age of Onset   Heart disease Mother    Heart disease Father    Kidney disease Sister    Hypertension Sister    Diabetes Sister    Hypertension Sister    Kidney failure Sister    Hypertension Brother    CVA Brother    Breast cancer Maternal Aunt 65   Ovarian cancer Neg Hx    Colon cancer Neg Hx     Social History   Tobacco Use   Smoking status: Never   Smokeless tobacco: Never  Substance Use Topics   Alcohol use: No    Alcohol/week: 0.0 standard drinks of alcohol  Current Outpatient Medications:    aspirin 81 MG tablet, Take 81 mg by mouth daily., Disp: , Rfl:    Calcium Carb-Cholecalciferol (CALCIUM 500+D3 PO), Take 2 tablets by mouth daily., Disp: , Rfl:    Cholecalciferol (VITAMIN D3) 1000 units CAPS, Take 1,000 Units by mouth daily., Disp: , Rfl:    diclofenac Sodium (VOLTAREN) 1 % GEL, Apply 2 g topically 4 (four) times daily as needed., Disp: 100 g, Rfl: 2   exemestane (AROMASIN) 25 MG tablet, TAKE 1 TABLET DAILY, Disp: 90 tablet, Rfl: 3   ezetimibe (ZETIA) 10 MG tablet, Take 1 tablet (10 mg total) by mouth daily., Disp: 90 tablet, Rfl: 3   glucose blood test strip, , Disp: , Rfl:    ketoconazole (NIZORAL) 2 % cream, Apply 1 application topically daily., Disp: 60 g, Rfl: 0   levothyroxine (SYNTHROID) 125 MCG tablet, Take 1 tablet (125 mcg total) by mouth daily., Disp: 90 tablet, Rfl: 1   terbinafine (LAMISIL) 250 MG tablet, Take 1 tablet (250 mg total) by mouth daily., Disp: 14 tablet, Rfl: 0   Alirocumab (PRALUENT) 75 MG/ML SOAJ, INJECT 75 MG INTO THE SKIN EVERY 14 DAYS, Disp: 2 mL, Rfl: 5   dapagliflozin propanediol (FARXIGA) 10 MG TABS tablet, Take 1 tablet (10 mg total) by mouth daily., Disp: 90  tablet, Rfl: 1   gabapentin (NEURONTIN) 100 MG capsule, Take 1 capsule by mouth 3 (three) times daily. (Patient not taking: Reported on 06/04/2022), Disp: , Rfl:    Olmesartan-amLODIPine-HCTZ 40-5-25 MG TABS, Take 1 tablet by mouth daily at 12 noon., Disp: 90 tablet, Rfl: 1   Semaglutide (RYBELSUS) 14 MG TABS, Take 1 tablet (14 mg total) by mouth daily at 6 (six) AM., Disp: 90 tablet, Rfl: 1  Allergies  Allergen Reactions   Pantoprazole Other (See Comments)    Affects thyroid    Rosuvastatin Other (See Comments)    Myalgia     I personally reviewed active problem list, medication list, allergies, family history, social history, health maintenance with the patient/caregiver today.   ROS  Ten systems reviewed and is negative except as mentioned in HPI   Objective  Vitals:   07/17/22 1514  BP: 128/64  Pulse: 90  Resp: 16  Temp: 98.3 F (36.8 C)  TempSrc: Oral  SpO2: 97%  Weight: 224 lb 6.4 oz (101.8 kg)  Height: '5\' 4"'$  (1.626 m)    Body mass index is 38.52 kg/m.  Physical Exam  Constitutional: Patient appears well-developed and well-nourished. Obese  No distress.  HEENT: head atraumatic, normocephalic, pupils equal and reactive to light,, neck supple Cardiovascular: Normal rate, regular rhythm and normal heart sounds.  No murmur heard. No BLE edema. Pulmonary/Chest: Effort normal and breath sounds normal. No respiratory distress. Abdominal: Soft.  There is no tenderness. Psychiatric: Patient has a normal mood and affect. behavior is normal. Judgment and thought content normal.   Recent Results (from the past 2160 hour(s))  TSH     Status: Abnormal   Collection Time: 05/11/22  9:37 AM  Result Value Ref Range   TSH 108.21 (H) 0.40 - 4.50 mIU/L  TSH     Status: None   Collection Time: 06/25/22  3:43 PM  Result Value Ref Range   TSH 1.49 0.40 - 4.50 mIU/L  POCT HgB A1C     Status: Abnormal   Collection Time: 07/17/22  3:25 PM  Result Value Ref Range   Hemoglobin A1C  6.6 (A) 4.0 - 5.6 %   HbA1c  POC (<> result, manual entry)     HbA1c, POC (prediabetic range)     HbA1c, POC (controlled diabetic range)      Diabetic Foot Exam: Diabetic Foot Exam - Simple   Simple Foot Form Visual Inspection See comments: Yes Sensation Testing Intact to touch and monofilament testing bilaterally: Yes Pulse Check Posterior Tibialis and Dorsalis pulse intact bilaterally: Yes Comments Pilling of heels and inner foot, improving with topical anti fungal but nor resolving      PHQ2/9:    07/17/2022    3:26 PM 04/02/2022    2:05 PM 03/07/2022    8:27 AM 01/11/2022    9:19 AM 12/19/2021   10:14 AM  Depression screen PHQ 2/9  Decreased Interest 0 0 2 1 0  Down, Depressed, Hopeless 0 0 0 1 0  PHQ - 2 Score 0 0 2 2 0  Altered sleeping 0 0 1 1 0  Tired, decreased energy 0 '3 3 1 '$ 0  Change in appetite 0 '2 1 1 '$ 0  Feeling bad or failure about yourself  0 1 0 0 0  Trouble concentrating 0 0 0 0 0  Moving slowly or fidgety/restless 0 0 0 0 0  Suicidal thoughts 0 0 0 0 0  PHQ-9 Score 0 '6 7 5 '$ 0  Difficult doing work/chores  Not difficult at all Not difficult at all Somewhat difficult Not difficult at all    phq 9 is negative   Fall Risk:    07/17/2022    3:25 PM 04/02/2022    2:04 PM 03/07/2022    8:19 AM 01/11/2022    9:19 AM 12/19/2021   10:14 AM  Fall Risk   Falls in the past year? 0 0 0 0 0  Number falls in past yr:  0  0 0  Injury with Fall?  0  0 0  Risk for fall due to : No Fall Risks  No Fall Risks No Fall Risks No Fall Risks  Follow up Falls prevention discussed;Falls evaluation completed;Education provided  Falls prevention discussed Falls prevention discussed Falls prevention discussed      Functional Status Survey: Is the patient deaf or have difficulty hearing?: No Does the patient have difficulty seeing, even when wearing glasses/contacts?: No Does the patient have difficulty concentrating, remembering, or making decisions?: No Does the patient have  difficulty walking or climbing stairs?: No Does the patient have difficulty dressing or bathing?: No Does the patient have difficulty doing errands alone such as visiting a doctor's office or shopping?: No    Assessment & Plan  1. Dyslipidemia due to type 2 diabetes mellitus (HCC)  - POCT HgB A1C - dapagliflozin propanediol (FARXIGA) 10 MG TABS tablet; Take 1 tablet (10 mg total) by mouth daily.  Dispense: 90 tablet; Refill: 1 - Alirocumab (PRALUENT) 75 MG/ML SOAJ; INJECT 75 MG INTO THE SKIN EVERY 14 DAYS  Dispense: 2 mL; Refill: 5 - ezetimibe (ZETIA) 10 MG tablet; Take 1 tablet (10 mg total) by mouth daily.  Dispense: 90 tablet; Refill: 3  2. Tinea pedis of both feet  - terbinafine (LAMISIL) 250 MG tablet; Take 1 tablet (250 mg total) by mouth daily.  Dispense: 14 tablet; Refill: 0  3. Hypertensive left ventricular hypertrophy, without heart failure  - Olmesartan-amLODIPine-HCTZ 40-5-25 MG TABS; Take 1 tablet by mouth daily at 12 noon.  Dispense: 90 tablet; Refill: 1  4. Dyslipidemia  - Alirocumab (PRALUENT) 75 MG/ML SOAJ; INJECT 75 MG INTO THE SKIN EVERY 14 DAYS  Dispense: 2 mL; Refill: 5 - ezetimibe (ZETIA) 10 MG tablet; Take 1 tablet (10 mg total) by mouth daily.  Dispense: 90 tablet; Refill: 3  5. Statin myopathy  - Alirocumab (PRALUENT) 75 MG/ML SOAJ; INJECT 75 MG INTO THE SKIN EVERY 14 DAYS  Dispense: 2 mL; Refill: 5 - ezetimibe (ZETIA) 10 MG tablet; Take 1 tablet (10 mg total) by mouth daily.  Dispense: 90 tablet; Refill: 3  6. Mild concentric left ventricular hypertrophy (LVH)  - Alirocumab (PRALUENT) 75 MG/ML SOAJ; INJECT 75 MG INTO THE SKIN EVERY 14 DAYS  Dispense: 2 mL; Refill: 5    7. Neuropathy   Go back to Dr. Phyllis Ginger if gabapentin does not improve symptoms

## 2022-07-17 ENCOUNTER — Encounter: Payer: Self-pay | Admitting: Family Medicine

## 2022-07-17 ENCOUNTER — Ambulatory Visit: Payer: BC Managed Care – PPO | Admitting: Family Medicine

## 2022-07-17 VITALS — BP 128/64 | HR 90 | Temp 98.3°F | Resp 16 | Ht 64.0 in | Wt 224.4 lb

## 2022-07-17 DIAGNOSIS — E785 Hyperlipidemia, unspecified: Secondary | ICD-10-CM | POA: Diagnosis not present

## 2022-07-17 DIAGNOSIS — N1831 Chronic kidney disease, stage 3a: Secondary | ICD-10-CM

## 2022-07-17 DIAGNOSIS — T466X5A Adverse effect of antihyperlipidemic and antiarteriosclerotic drugs, initial encounter: Secondary | ICD-10-CM

## 2022-07-17 DIAGNOSIS — B353 Tinea pedis: Secondary | ICD-10-CM

## 2022-07-17 DIAGNOSIS — C50411 Malignant neoplasm of upper-outer quadrant of right female breast: Secondary | ICD-10-CM | POA: Diagnosis not present

## 2022-07-17 DIAGNOSIS — I10 Essential (primary) hypertension: Secondary | ICD-10-CM

## 2022-07-17 DIAGNOSIS — G720 Drug-induced myopathy: Secondary | ICD-10-CM

## 2022-07-17 DIAGNOSIS — I119 Hypertensive heart disease without heart failure: Secondary | ICD-10-CM

## 2022-07-17 DIAGNOSIS — E1169 Type 2 diabetes mellitus with other specified complication: Secondary | ICD-10-CM | POA: Diagnosis not present

## 2022-07-17 DIAGNOSIS — I517 Cardiomegaly: Secondary | ICD-10-CM

## 2022-07-17 DIAGNOSIS — E890 Postprocedural hypothyroidism: Secondary | ICD-10-CM

## 2022-07-17 DIAGNOSIS — G629 Polyneuropathy, unspecified: Secondary | ICD-10-CM

## 2022-07-17 DIAGNOSIS — G4733 Obstructive sleep apnea (adult) (pediatric): Secondary | ICD-10-CM

## 2022-07-17 LAB — POCT GLYCOSYLATED HEMOGLOBIN (HGB A1C): Hemoglobin A1C: 6.6 % — AB (ref 4.0–5.6)

## 2022-07-17 MED ORDER — RYBELSUS 14 MG PO TABS: 1.0000 | ORAL_TABLET | Freq: Every day | ORAL | 1 refills | Status: DC

## 2022-07-17 MED ORDER — TERBINAFINE HCL 250 MG PO TABS: 250.0000 mg | ORAL_TABLET | Freq: Every day | ORAL | 0 refills | Status: DC

## 2022-07-17 MED ORDER — EZETIMIBE 10 MG PO TABS: 10.0000 mg | ORAL_TABLET | Freq: Every day | ORAL | 3 refills | Status: DC

## 2022-07-17 MED ORDER — DAPAGLIFLOZIN PROPANEDIOL 10 MG PO TABS: 10.0000 mg | ORAL_TABLET | Freq: Every day | ORAL | 1 refills | Status: DC

## 2022-07-17 MED ORDER — OLMESARTAN-AMLODIPINE-HCTZ 40-5-25 MG PO TABS: 1.0000 | ORAL_TABLET | Freq: Every day | ORAL | 1 refills | Status: DC

## 2022-07-17 MED ORDER — PRALUENT 75 MG/ML ~~LOC~~ SOAJ: SUBCUTANEOUS | 5 refills | Status: DC

## 2022-07-17 NOTE — Patient Instructions (Signed)
[employer] voucher: Bin# [id number] GRP# [id number] PCN#[id number]                   ID# [id number]

## 2022-07-23 ENCOUNTER — Telehealth: Payer: Self-pay | Admitting: Family Medicine

## 2022-07-23 NOTE — Telephone Encounter (Signed)
Andrea Randolph calling from [employer] is calling to report that the ezetimibe (ZETIA) 10 MG tablet [[id number]]  are not covered under her insurance plan. A lisit of statin drugs was faxed that would be a better option. Please advise CB- [phone] Reference [id number]

## 2022-07-23 NOTE — Telephone Encounter (Signed)
After speaking with [facility] I initiated a PA since she cannot tolerate statins.

## 2022-07-26 DIAGNOSIS — C50911 Malignant neoplasm of unspecified site of right female breast: Secondary | ICD-10-CM | POA: Diagnosis not present

## 2022-08-03 DIAGNOSIS — M79642 Pain in left hand: Secondary | ICD-10-CM | POA: Diagnosis not present

## 2022-08-03 DIAGNOSIS — M79641 Pain in right hand: Secondary | ICD-10-CM | POA: Diagnosis not present

## 2022-08-20 DIAGNOSIS — M65341 Trigger finger, right ring finger: Secondary | ICD-10-CM | POA: Diagnosis not present

## 2022-08-20 DIAGNOSIS — M25541 Pain in joints of right hand: Secondary | ICD-10-CM | POA: Diagnosis not present

## 2022-08-20 DIAGNOSIS — M25542 Pain in joints of left hand: Secondary | ICD-10-CM | POA: Diagnosis not present

## 2022-09-03 ENCOUNTER — Other Ambulatory Visit: Payer: Self-pay | Admitting: Family Medicine

## 2022-09-03 DIAGNOSIS — E890 Postprocedural hypothyroidism: Secondary | ICD-10-CM

## 2022-09-03 DIAGNOSIS — E1169 Type 2 diabetes mellitus with other specified complication: Secondary | ICD-10-CM

## 2022-09-06 ENCOUNTER — Inpatient Hospital Stay (HOSPITAL_BASED_OUTPATIENT_CLINIC_OR_DEPARTMENT_OTHER): Payer: BC Managed Care – PPO | Admitting: Oncology

## 2022-09-06 ENCOUNTER — Encounter: Payer: Self-pay | Admitting: Oncology

## 2022-09-06 ENCOUNTER — Inpatient Hospital Stay: Payer: BC Managed Care – PPO | Attending: Oncology

## 2022-09-06 VITALS — BP 164/80 | HR 55 | Temp 96.8°F | Resp 18 | Wt 222.5 lb

## 2022-09-06 DIAGNOSIS — E119 Type 2 diabetes mellitus without complications: Secondary | ICD-10-CM | POA: Insufficient documentation

## 2022-09-06 DIAGNOSIS — I10 Essential (primary) hypertension: Secondary | ICD-10-CM | POA: Insufficient documentation

## 2022-09-06 DIAGNOSIS — Z803 Family history of malignant neoplasm of breast: Secondary | ICD-10-CM | POA: Diagnosis not present

## 2022-09-06 DIAGNOSIS — C50211 Malignant neoplasm of upper-inner quadrant of right female breast: Secondary | ICD-10-CM

## 2022-09-06 DIAGNOSIS — N1831 Chronic kidney disease, stage 3a: Secondary | ICD-10-CM | POA: Diagnosis not present

## 2022-09-06 DIAGNOSIS — D631 Anemia in chronic kidney disease: Secondary | ICD-10-CM

## 2022-09-06 DIAGNOSIS — Z923 Personal history of irradiation: Secondary | ICD-10-CM | POA: Diagnosis not present

## 2022-09-06 DIAGNOSIS — Z79811 Long term (current) use of aromatase inhibitors: Secondary | ICD-10-CM

## 2022-09-06 DIAGNOSIS — Z7982 Long term (current) use of aspirin: Secondary | ICD-10-CM | POA: Diagnosis not present

## 2022-09-06 DIAGNOSIS — Z7984 Long term (current) use of oral hypoglycemic drugs: Secondary | ICD-10-CM | POA: Insufficient documentation

## 2022-09-06 DIAGNOSIS — Z170 Estrogen receptor positive status [ER+]: Secondary | ICD-10-CM | POA: Diagnosis not present

## 2022-09-06 DIAGNOSIS — E039 Hypothyroidism, unspecified: Secondary | ICD-10-CM | POA: Insufficient documentation

## 2022-09-06 DIAGNOSIS — Z79899 Other long term (current) drug therapy: Secondary | ICD-10-CM | POA: Diagnosis not present

## 2022-09-06 LAB — CBC WITH DIFFERENTIAL/PLATELET
Abs Immature Granulocytes: 0.01 10*3/uL (ref 0.00–0.07)
Basophils Absolute: 0 10*3/uL (ref 0.0–0.1)
Basophils Relative: 1 %
Eosinophils Absolute: 0.1 10*3/uL (ref 0.0–0.5)
Eosinophils Relative: 3 %
HCT: 43.3 % (ref 36.0–46.0)
Hemoglobin: 14.2 g/dL (ref 12.0–15.0)
Immature Granulocytes: 0 %
Lymphocytes Relative: 39 %
Lymphs Abs: 1.3 10*3/uL (ref 0.7–4.0)
MCH: 27.7 pg (ref 26.0–34.0)
MCHC: 32.8 g/dL (ref 30.0–36.0)
MCV: 84.4 fL (ref 80.0–100.0)
Monocytes Absolute: 0.3 10*3/uL (ref 0.1–1.0)
Monocytes Relative: 7 %
Neutro Abs: 1.7 10*3/uL (ref 1.7–7.7)
Neutrophils Relative %: 50 %
Platelets: 362 10*3/uL (ref 150–400)
RBC: 5.13 MIL/uL — ABNORMAL HIGH (ref 3.87–5.11)
RDW: 13.6 % (ref 11.5–15.5)
WBC: 3.4 10*3/uL — ABNORMAL LOW (ref 4.0–10.5)
nRBC: 0 % (ref 0.0–0.2)

## 2022-09-06 LAB — COMPREHENSIVE METABOLIC PANEL
ALT: 19 U/L (ref 0–44)
AST: 18 U/L (ref 15–41)
Albumin: 4 g/dL (ref 3.5–5.0)
Alkaline Phosphatase: 49 U/L (ref 38–126)
Anion gap: 8 (ref 5–15)
BUN: 18 mg/dL (ref 8–23)
CO2: 28 mmol/L (ref 22–32)
Calcium: 9.1 mg/dL (ref 8.9–10.3)
Chloride: 103 mmol/L (ref 98–111)
Creatinine, Ser: 1.2 mg/dL — ABNORMAL HIGH (ref 0.44–1.00)
GFR, Estimated: 51 mL/min — ABNORMAL LOW (ref >60)
Glucose, Bld: 112 mg/dL — ABNORMAL HIGH (ref 70–99)
Potassium: 3.2 mmol/L — ABNORMAL LOW (ref 3.5–5.1)
Sodium: 139 mmol/L (ref 135–145)
Total Bilirubin: 0.6 mg/dL (ref 0.3–1.2)
Total Protein: 7.8 g/dL (ref 6.5–8.1)

## 2022-09-06 MED ORDER — POTASSIUM CHLORIDE CRYS ER 20 MEQ PO TBCR: 20.0000 meq | EXTENDED_RELEASE_TABLET | Freq: Every day | ORAL | 0 refills | Status: DC

## 2022-09-06 NOTE — Progress Notes (Signed)
Hematology/Oncology Progress note [phone] [phone]      ASSESSMENT & PLAN:   Cancer Staging  Malignant 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, estrogen receptor positive ([place]) Staging form: Breast, AJCC 8th Edition - Pathologic stage from 03/20/2021: Stage IA (pT1a, pN0, cM0, G2, ER+, PR+, HER2-) - Signed by Earlie Server, MD on 03/20/2021   Malignant 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, estrogen receptor positive ([place]) Right breast invasive carcinoma.  pT1a pN0, ER positive, PR positive, HER2 negative. Focal DCIS margin positive.Patient declined reexcision.s/p adjuvant radiation.  Unable to tolerate Arimidex  switched to Aromasin. Labs reviewed and discussed with patient Continue Aromasin 16m daily.    Aromatase inhibitor use Recommend calcium 1200 mg daily.  She has a normal baseline bone density. Repeat DEXA in July 2024   Orders Placed This Encounter  Procedures   [place]AST LTD UNI LEFT INC AXILLA    Standing Status:   Future    Standing Expiration Date:   09/07/2023    Order Specific Question:   Reason for Exam (SYMPTOM  OR DIAGNOSIS REQUIRED)    Answer:   hx breast cancer    Order Specific Question:   Preferred imaging location?    Answer:   [facility]   [place]BREAST LTD UNI RIGHT INC AXILLA    Standing Status:   Future    Standing Expiration Date:   09/07/2023    Order Specific Question:   Reason for Exam (SYMPTOM  OR DIAGNOSIS REQUIRED)    Answer:   hx breast cancer    Order Specific Question:   Preferred imaging location?    Answer:   [facility]   MM DIAG BREAST TOMO BILATERAL    Standing Status:   Future    Standing Expiration Date:   09/07/2023    Order Specific Question:   Reason for Exam (SYMPTOM  OR DIAGNOSIS REQUIRED)    Answer:   hx breast cancer , no new breas concerns    Order Specific Question:   Preferred imaging location?    Answer:   [facility]   CBC with  Differential/Platelet    Standing Status:   Future    Standing Expiration Date:   09/07/2023   Comprehensive metabolic panel    Standing Status:   Future    Standing Expiration Date:   09/06/2023   Recommend 6 months.  All questions were answered. The patient knows to call the clinic with any problems, questions or concerns.  ZEarlie Server MD, PhD [facility] Oncology 09/06/2022    CHIEF COMPLAINTS/REASON FOR VISIT:  Follow up of breast cancer.   HISTORY OF PRESENTING ILLNESS:   [name] a  66y.o.  female with PMH listed below was seen in consultation at the request of  SSteele Sizer MD  for evaluation of breast cancer.   01/12/21 screening mammogram showed focal asymmetry with possible distortion.  01/26/21 right diagnostic mammogram and UKoreashowed [id number] mass, s/p biopsy on 02/07/21 Pathology showed invasive mammary carcinoma, grade 2, ER90% PR 90%, HER2 negative.   Patient was seen by Dr.Byrnett yesterday. She presents to establish care with oncology Accompanied by her daughter  Menarche [age] with Age at first childbirth [age] OCP use for about 20 years Denies any estrogen replacement. LMP in 5069sDenies any previous chest radiation. History of left breast biopsy previously. Family history Father for maternal aunt who was diagnosed of breast cancer at age  of 50.  03/06/2021 lumpectomy and sentinel lymph node biopsy. Pathology showed invasive mammary carcinoma, no special type, DCIS in situ, intermediate grade.  All 3 sentinel lymph nodes were negative.  1 nonsentinel lymph node was negative.  Margins for invasive carcinoma was negative.  Margins for DCIS was unifocal positive. Patient declined reexcision.Patient's case was discussed on breast cancer tumor board on 03/20/2021.  # 05/03/2021 finished adjuvant radiation.  05/09/2021, patient started on Arimidex.  Patient called to report that she has experienced significant leg pain, back pain, neck  pain shortly after starting Arimidex.  left knee pain and swelling.  06/20/2021, left knee x-ray showed diffuse joint space narrowing of the left knee with spurring at the lateral compartment.  Small joint effusion.  07/20/2021 patient was seen by orthopedic surgeon for left knee arthritis.  Patient received intra-articular corticosteroid injection and left knee pain has improved.  01/15/2022  bilateral diagnostic mammogram results were reviewed and discussed with patient.  Continue annual mammogram. Bone health, 05/16/2021, normal bone density.  INTERVAL HISTORY Andrea Randolph is a 66 y.o. female who has above history reviewed by me today presents for follow up visit for management of breast cancer Patient continues to have chronic arthralgia.   + Arthralgia she takes Aromasin daily and sometime she skips a few days if pain is severe  Otherwise no new complaints.   Review of Systems  Constitutional:  Negative for appetite change, chills, fatigue and fever.  HENT:   Negative for hearing loss and voice change.   Eyes:  Negative for eye problems.  Respiratory:  Negative for chest tightness and cough.   Cardiovascular:  Negative for chest pain.  Gastrointestinal:  Negative for abdominal distention, abdominal pain and blood in stool.  Endocrine: Negative for hot flashes.  Genitourinary:  Negative for difficulty urinating and frequency.   Musculoskeletal:  Positive for arthralgias.  Skin:  Negative for itching and rash.  Neurological:  Negative for extremity weakness.  Hematological:  Negative for adenopathy.  Psychiatric/Behavioral:  Negative for confusion. The patient is not nervous/anxious.     MEDICAL HISTORY:  Past Medical History:  Diagnosis Date   Allergy    Anemia    Arthritis of right shoulder region    Dr. Tamala Julian   Cancer [facility])    breast cancer right side   Chronic insomnia    Diabetes mellitus without complication (HCC)    Family history of adverse reaction to anesthesia     sister with Nausea   GERD (gastroesophageal reflux disease)    Goiter    Dr. Gabriel Carina   Heart murmur    Hyperlipidemia    Hypertension    Hypertensive retinopathy of left eye    Hypothyroidism, adult    Dr. Gabriel Carina   Inflammation of urethra    Menopause    Microalbuminuria    DM   Mixed incontinence    Muscle cramps    Obesity    OSA (obstructive sleep apnea)    Personal history of radiation therapy    Breast Cancer Right breast   Radiculitis, lumbosacral    Vitamin D deficiency     SURGICAL HISTORY: Past Surgical History:  Procedure Laterality Date   BREAST BIOPSY Left 90's   benign core needle    BREAST BIOPSY Right 02/07/2021   [place] Bx, Q-clip, path pending    BREAST LUMPECTOMY Right 03/06/2021   BREAST LUMPECTOMY WITH SENTINEL LYMPH NODE BIOPSY Right 03/06/2021   Procedure: BREAST LUMPECTOMY WITH SENTINEL LYMPH NODE BX;  Surgeon: Robert Bellow, MD;  Location: [facility] ORS;  Service: General;  Laterality: Right;   COLONOSCOPY WITH PROPOFOL N/A 01/26/2019   Procedure: COLONOSCOPY WITH PROPOFOL;  Surgeon: Lin Landsman, MD;  Location[facility] ENDOSCOPY;  Service: Gastroenterology;  Laterality: N/A;   COLONOSCOPY WITH PROPOFOL N/A 02/09/2022   Procedure: COLONOSCOPY WITH PROPOFOL;  Surgeon: Jonathon Bellows, MD;  Location: [facility] ENDOSCOPY;  Service: Gastroenterology;  Laterality: N/A;   FOOT SURGERY Bilateral    heel spurs   JOINT REPLACEMENT     TOTAL SHOULDER REPLACEMENT Right 01/25/2015   Dr. Roland Rack   TOTAL THYROIDECTOMY  08/01/2018    SOCIAL HISTORY: Social History   Socioeconomic History   Marital status: Legally Separated    Spouse name: Not on file   Number of children: 1   Years of education: Not on file   Highest education level: 12th grade  Occupational History   Occupation: Cytogeneticist  Tobacco Use   Smoking status: Never   Smokeless tobacco: Never  Vaping Use   Vaping Use: Never used  Substance and Sexual Activity   Alcohol use: No     Alcohol/week: 0.0 standard drinks of alcohol   Drug use: No   Sexual activity: Not Currently    Birth control/protection: Post-menopausal  Other Topics Concern   Not on file  Social History Narrative   Separated in around 2009.   She was living with her daughter, however currently moved in with her sister and nephew , but is looking for a house.    Social Determinants of Health   Financial Resource Strain: Low Risk  (01/11/2022)   Overall Financial Resource Strain (CARDIA)    Difficulty of Paying Living Expenses: Not hard at all  Food Insecurity: No Food Insecurity (01/11/2022)   Hunger Vital Sign    Worried About Running Out of Food in the Last Year: Never true    Ran Out of Food in the Last Year: Never true  Transportation Needs: No Transportation Needs (01/11/2022)   PRAPARE - Hydrologist (Medical): No    Lack of Transportation (Non-Medical): No  Physical Activity: Inactive (01/11/2022)   Exercise Vital Sign    Days of Exercise per Week: 0 days    Minutes of Exercise per Session: 0 min  Stress: No Stress Concern Present (01/11/2022)   [place]    Feeling of Stress : Only a little  Social Connections: Moderately Integrated (01/11/2022)   Social Connection and Isolation Panel [NHANES]    Frequency of Communication with Friends and Family: More than three times a week    Frequency of Social Gatherings with Friends and Family: More than three times a week    Attends Religious Services: 1 to 4 times per year    Active Member of [employer] or Organizations: Yes    Attends Archivist Meetings: 1 to 4 times per year    Marital Status: Separated  Intimate Partner Violence: Not At Risk (01/11/2022)   Humiliation, Afraid, Rape, and Kick questionnaire    Fear of Current or Ex-Partner: No    Emotionally Abused: No    Physically Abused: No    Sexually Abused: No    FAMILY HISTORY: Family  History  Problem Relation Age of Onset   Heart disease Mother    Heart disease Father    Kidney disease Sister    Hypertension Sister    Diabetes Sister    Hypertension Sister  Kidney failure Sister    Hypertension Brother    CVA Brother    Breast cancer Maternal Aunt 40   Ovarian cancer Neg Hx    Colon cancer Neg Hx     ALLERGIES:  is allergic to pantoprazole and rosuvastatin.  MEDICATIONS:  Current Outpatient Medications  Medication Sig Dispense Refill   Alirocumab (PRALUENT) 75 MG/ML SOAJ INJECT 75 MG INTO THE SKIN EVERY 14 DAYS 2 mL 5   aspirin 81 MG tablet Take 81 mg by mouth daily.     Calcium Carb-Cholecalciferol (CALCIUM 500+D3 PO) Take 2 tablets by mouth daily.     Cholecalciferol (VITAMIN D3) 1000 units CAPS Take 1,000 Units by mouth daily.     diclofenac Sodium (VOLTAREN) 1 % GEL Apply 2 g topically 4 (four) times daily as needed. 100 g 2   exemestane (AROMASIN) 25 MG tablet TAKE 1 TABLET DAILY 90 tablet 3   ezetimibe (ZETIA) 10 MG tablet Take 1 tablet (10 mg total) by mouth daily. 90 tablet 3   FARXIGA 10 MG TABS tablet TAKE 1 TABLET DAILY 90 tablet 0   gabapentin (NEURONTIN) 100 MG capsule Take 1 capsule by mouth 3 (three) times daily.     glucose blood test strip      ketoconazole (NIZORAL) 2 % cream Apply 1 application topically daily. 60 g 0   levothyroxine (SYNTHROID) 125 MCG tablet TAKE 1 TABLET DAILY 90 tablet 0   Olmesartan-amLODIPine-HCTZ 40-5-25 MG TABS Take 1 tablet by mouth daily at 12 noon. 90 tablet 1   potassium chloride SA (KLOR-CON M) 20 MEQ tablet Take 1 tablet (20 mEq total) by mouth daily. 3 tablet 0   Semaglutide (RYBELSUS) 14 MG TABS Take 1 tablet (14 mg total) by mouth daily at 6 (six) AM. 90 tablet 1   No current facility-administered medications for this visit.     PHYSICAL EXAMINATION: ECOG PERFORMANCE STATUS: 0 - Asymptomatic Vitals:   09/06/22 1109  BP: (!) 164/80  Pulse: (!) 55  Resp: 18  Temp: (!) 96.8 F (36 C)   Filed  Weights   09/06/22 1109  Weight: 222 lb 8 oz (100.9 kg)    Physical Exam Constitutional:      General: She is not in acute distress. HENT:     Head: Normocephalic and atraumatic.  Eyes:     General: No scleral icterus. Cardiovascular:     Rate and Rhythm: Normal rate and regular rhythm.     Heart sounds: Murmur heard.  Pulmonary:     Effort: Pulmonary effort is normal. No respiratory distress.     Breath sounds: No wheezing.  Abdominal:     General: Bowel sounds are normal. There is no distension.     Palpations: Abdomen is soft.  Musculoskeletal:        General: No deformity. Normal range of motion.     Cervical back: Normal range of motion and neck supple.  Skin:    General: Skin is warm and dry.     Findings: No erythema or rash.  Neurological:     Mental Status: She is alert and oriented to person, place, and time. Mental status is at baseline.     Cranial Nerves: No cranial nerve deficit.     Coordination: Coordination normal.  Psychiatric:        Mood and Affect: Mood normal.    LABORATORY DATA:  I have reviewed the data as listed    Latest Ref Rng & Units 09/06/2022   10:46  AM 04/02/2022    2:50 PM 03/07/2022    9:40 AM  CBC  WBC 4.0 - 10.5 K/uL 3.4  9.2  4.0   Hemoglobin 12.0 - 15.0 g/dL 14.2  14.9  12.8   Hematocrit 36.0 - 46.0 % 43.3  45.9  39.6   Platelets 150 - 400 K/uL 362  372  323       Latest Ref Rng & Units 09/06/2022   10:46 AM 04/02/2022    2:50 PM 03/07/2022    9:40 AM  CMP  Glucose 70 - 99 mg/dL 112  151  97   BUN 8 - 23 mg/dL 18  14  17    Creatinine 0.44 - 1.00 mg/dL 1.20  1.27  1.04   Sodium 135 - 145 mmol/L 139  139  139   Potassium 3.5 - 5.1 mmol/L 3.2  3.5  4.1   Chloride 98 - 111 mmol/L 103  101  105   CO2 22 - 32 mmol/L 28  27  26    Calcium 8.9 - 10.3 mg/dL 9.1  10.1  9.0   Total Protein 6.5 - 8.1 g/dL 7.8   7.0   Total Bilirubin 0.3 - 1.2 mg/dL 0.6   0.6   Alkaline Phos 38 - 126 U/L 49   37   AST 15 - 41 U/L 18   18   ALT 0 -  44 U/L 19   15      RADIOGRAPHIC STUDIES: I have personally reviewed the radiological images as listed and agreed with the findings in the report. No results found.

## 2022-09-06 NOTE — Assessment & Plan Note (Signed)
Recommend calcium 1200 mg daily.  She has a normal baseline bone density. Repeat DEXA in July 2024

## 2022-09-06 NOTE — Assessment & Plan Note (Addendum)
Right breast invasive carcinoma.  pT1a pN0, ER positive, PR positive, HER2 negative. Focal DCIS margin positive.Patient declined reexcision.s/p adjuvant radiation.  Unable to tolerate Arimidex  switched to Aromasin. Labs reviewed and discussed with patient Continue Aromasin 74m daily.  Annual mammogram

## 2022-09-06 NOTE — Progress Notes (Signed)
Patient here for follow up. No new breast problem, no new breast concerns

## 2022-09-14 ENCOUNTER — Ambulatory Visit: Payer: BC Managed Care – PPO | Attending: Medical

## 2022-09-14 DIAGNOSIS — I351 Nonrheumatic aortic (valve) insufficiency: Secondary | ICD-10-CM

## 2022-09-14 LAB — ECHOCARDIOGRAM COMPLETE
Area-P 1/2: 2.55 cm2
P 1/2 time: 643 msec
Single Plane A4C EF: 59.7 %

## 2022-09-14 MED ORDER — PERFLUTREN LIPID MICROSPHERE
1.0000 mL | INTRAVENOUS | Status: AC | PRN
  Administered 2022-09-14: 2 mL via INTRAVENOUS

## 2022-09-28 ENCOUNTER — Encounter: Payer: Self-pay | Admitting: Family Medicine

## 2022-10-16 ENCOUNTER — Other Ambulatory Visit: Payer: Self-pay

## 2022-10-17 ENCOUNTER — Other Ambulatory Visit: Payer: Self-pay | Admitting: Family Medicine

## 2022-10-17 DIAGNOSIS — E890 Postprocedural hypothyroidism: Secondary | ICD-10-CM

## 2022-11-23 NOTE — Progress Notes (Unsigned)
Name: Andrea Randolph   MRN: [id number]    DOB: 10-26-1956   Date:11/26/2022       Progress Note  Subjective  Chief Complaint  Follow Up  HPI  DMII: she is currently taking Farxiga and Rybelsus, off Metformin . Last A1C was 6.4 %, 6.7 %, 6.6 %  today is up to 7.7 %  She has dyslipidemia, HTN , CKI and obesity. She is on ARB, and Zetia started on Praluent since Feb 2023   She denies polyphagia, polydipsia or polyuria. She states statin therapy caused myalgia She has noticed some neuropathy on both heels - started about one month ago. Discussed diabetic neuropathy - she already gabapentin at home ( given by Dr. Garen Grams for her back) but needs a refill today since she has been taking at night for neuropathy and has helped   CKI stage III: last GFR was 56 , she has good urine output, no pruritis. . She is on ARB and SGL-2 agonist , we will recheck urine micro today   LVH and aortic stenosis: under care of Dr. Rockey Situ, no chest pain, palpitation or sob, she denies orthopnea She is on Farxiga . She has been walking and denies decrease in exercise tolerance   Vitamin D low: she is now taking 2000 units daily, continue supplementation , we will recheck labs    Surgical hypothyroidism: she has been taking levothyroxine 125 mcg  her level in April was over 100 - she had started taking PPI after a visit to the [facility] with chest pain, since she stopped taking it , last TSH was at goal and we will recheck it today   Morbid obesity: BMI above 35 with co-morbidities. She has DM, HTN, dyslipidemia and OA. She will try going to the gym more often now that she retired   Radicular lumbosacral pain: under the care of Dr. Phyllis Ginger , radiating down right leg. She had some steroid injections and it helped with the leg pain , she states back pain has been stable.   OA/Knee bilateral: saw Dr. Tamala Julian 09/22 and had steroid injection , pain is down to 1/10 on left knee, using Voltaren gel now since she got it from [facility].  Unchanged    Hyperlipidemia: she was Pravastatin  and Zetia . Recently off statin therapy due to muscle aches and elevated CK, last LDL over 180, now on zetia and praluent but we will change to Pradaxa due to cost    OSA: she does not tolerate CPAP machine. Unchanged    Breast Cancer right : diagnosed by abnormal mammogram back in March 22, lumpectomy was done 03/06/2021 followed by 5 weeks of radiation and finished 05/03/2021. She was doing well until she started on Arimidex, she was having body aches,  joint are stiff and aching, difficulty sleeping, change in bowel movement, increase in appetite. She is now off Arimidex and on Aromasin 25 mg still feels stiff and not able to go to the gym, she has been taking it every two days to be able to tolerate the side effects. She has lymphedema right arm and was wearing a arm sleeve and it helped.      Patient Active Problem List   Diagnosis Date Noted   Anemia in stage 3a chronic kidney disease ([place]) 09/06/2022   Genetic testing 03/28/2022   Stage 3b chronic kidney disease ([place]) 03/07/2022   Aromatase inhibitor use 05/30/2021   Complaints of total body pain 05/30/2021   Malignant neoplasm of upper-inner quadrant  of right breast in female, estrogen receptor positive ([place]) 02/20/2021   Thrombocytosis 11/01/2019   Carpal tunnel syndrome, bilateral 02/19/2019   Trigger finger of right thumb 02/19/2019   Flexor tenosynovitis of finger 02/19/2019   Severe obesity (BMI 35.0-35.9 with comorbidity) ([place]) 12/29/2018   Mild concentric left ventricular hypertrophy (LVH) 12/24/2017   Mild aortic stenosis by prior echocardiogram 12/24/2017   Mild aortic regurgitation 12/24/2017   CKD (chronic kidney disease), stage I 06/22/2016   Uterine fibroid 01/11/2016   Allergic rhinitis, seasonal 05/24/2015   Benign essential HTN 05/24/2015   Chronic constipation 05/24/2015   Diabetes mellitus with renal manifestation ([place]) 05/24/2015   Dyslipidemia 05/24/2015    Post-surgical hypothyroidism 05/24/2015   Mixed incontinence 05/24/2015   Microalbuminuria 05/24/2015   Menopause 05/24/2015   Obstructive apnea 05/24/2015   Morbid obesity ([place]) 05/24/2015   Hypertensive retinopathy 05/24/2015   Vitamin D deficiency 05/24/2015   Status post total replacement of right shoulder 03/11/2015    Past Surgical History:  Procedure Laterality Date   BREAST BIOPSY Left 90's   benign core needle    BREAST BIOPSY Right 02/07/2021   [place] Bx, Q-clip, path pending    BREAST LUMPECTOMY Right 03/06/2021   BREAST LUMPECTOMY WITH SENTINEL LYMPH NODE BIOPSY Right 03/06/2021   Procedure: BREAST LUMPECTOMY WITH SENTINEL LYMPH NODE BX;  Surgeon: Robert Bellow, MD;  Location: [facility] ORS;  Service: General;  Laterality: Right;   COLONOSCOPY WITH PROPOFOL N/A 01/26/2019   Procedure: COLONOSCOPY WITH PROPOFOL;  Surgeon: Lin Landsman, MD;  Location: [facility] ENDOSCOPY;  Service: Gastroenterology;  Laterality: N/A;   COLONOSCOPY WITH PROPOFOL N/A 02/09/2022   Procedure: COLONOSCOPY WITH PROPOFOL;  Surgeon: Jonathon Bellows, MD;  Location: [facility] ENDOSCOPY;  Service: Gastroenterology;  Laterality: N/A;   FOOT SURGERY Bilateral    heel spurs   JOINT REPLACEMENT     TOTAL SHOULDER REPLACEMENT Right 01/25/2015   Dr. Roland Rack   TOTAL THYROIDECTOMY  08/01/2018    Family History  Problem Relation Age of Onset   Heart disease Mother    Heart disease Father    Kidney disease Sister    Hypertension Sister    Diabetes Sister    Hypertension Sister    Kidney failure Sister    Hypertension Brother    CVA Brother    Breast cancer Maternal Aunt 10   Ovarian cancer Neg Hx    Colon cancer Neg Hx     Social History   Tobacco Use   Smoking status: Never   Smokeless tobacco: Never  Substance Use Topics   Alcohol use: No    Alcohol/week: 0.0 standard drinks of alcohol     Current Outpatient Medications:    aspirin 81 MG tablet, Take 81 mg by mouth daily., Disp: , Rfl:     Calcium Carb-Cholecalciferol (CALCIUM 500+D3 PO), Take 2 tablets by mouth daily., Disp: , Rfl:    Cholecalciferol (VITAMIN D3) 1000 units CAPS, Take 1,000 Units by mouth daily., Disp: , Rfl:    diclofenac Sodium (VOLTAREN) 1 % GEL, Apply 2 g topically 4 (four) times daily as needed., Disp: 100 g, Rfl: 2   Evolocumab (REPATHA SURECLICK) 562 MG/ML SOAJ, Inject 140 mg into the skin every 14 (fourteen) days., Disp: 1.96 mL, Rfl: 5   exemestane (AROMASIN) 25 MG tablet, TAKE 1 TABLET DAILY, Disp: 90 tablet, Rfl: 3   ezetimibe (ZETIA) 10 MG tablet, Take 1 tablet (10 mg total) by mouth daily., Disp: 90 tablet, Rfl: 3   glucose blood test  strip, , Disp: , Rfl:    ketoconazole (NIZORAL) 2 % cream, Apply 1 application topically daily., Disp: 60 g, Rfl: 0   levothyroxine (SYNTHROID) 125 MCG tablet, Take 1 tablet by mouth once daily, Disp: 90 tablet, Rfl: 0   dapagliflozin propanediol (FARXIGA) 10 MG TABS tablet, Take 1 tablet (10 mg total) by mouth daily., Disp: 90 tablet, Rfl: 1   gabapentin (NEURONTIN) 100 MG capsule, Take 2 capsules (200 mg total) by mouth at bedtime as needed., Disp: 180 capsule, Rfl: 1   Olmesartan-amLODIPine-HCTZ 40-5-25 MG TABS, Take 1 tablet by mouth daily at 12 noon., Disp: 90 tablet, Rfl: 1   Semaglutide (RYBELSUS) 14 MG TABS, Take 1 tablet (14 mg total) by mouth daily at 6 (six) AM., Disp: 90 tablet, Rfl: 1  Allergies  Allergen Reactions   Pantoprazole Other (See Comments)    Affects thyroid    Rosuvastatin Other (See Comments)    Myalgia     I personally reviewed active problem list, medication list, allergies, family history, social history, health maintenance with the patient/caregiver today.   ROS  Constitutional: Negative for fever or weight change.  Respiratory: Negative for cough and shortness of breath.   Cardiovascular: Negative for chest pain or palpitations.  Gastrointestinal: Negative for abdominal pain, no bowel changes.  Musculoskeletal: Negative for gait  problem or joint swelling.  Skin: Negative for rash.  Neurological: Negative for dizziness or headache.  No other specific complaints in a complete review of systems (except as listed in HPI above).   Objective  Vitals:   11/26/22 1457  BP: 126/74  Pulse: 95  Resp: 16  SpO2: 97%  Weight: 225 lb (102.1 kg)  Height: '5\' 4"'$  (1.626 m)    Body mass index is 38.62 kg/m.  Physical Exam  Constitutional: Patient appears well-developed and well-nourished. Obese  No distress.  HEENT: head atraumatic, normocephalic, pupils equal and reactive to light, neck supple Cardiovascular: Normal rate, regular rhythm and normal heart sounds.  No murmur heard. No BLE edema. Pulmonary/Chest: Effort normal and breath sounds normal. No respiratory distress. Abdominal: Soft.  There is no tenderness. Skin: raised , flash color lesion on right side of face  Psychiatric: Patient has a normal mood and affect. behavior is normal. Judgment and thought content normal.   Recent Results (from the past 2160 hour(s))  Comprehensive metabolic panel     Status: Abnormal   Collection Time: 09/06/22 10:46 AM  Result Value Ref Range   Sodium 139 135 - 145 mmol/L   Potassium 3.2 (L) 3.5 - 5.1 mmol/L   Chloride 103 98 - 111 mmol/L   CO2 28 22 - 32 mmol/L   Glucose, Bld 112 (H) 70 - 99 mg/dL    Comment: Glucose reference range applies only to samples taken after fasting for at least 8 hours.   BUN 18 8 - 23 mg/dL   Creatinine, Ser 1.20 (H) 0.44 - 1.00 mg/dL   Calcium 9.1 8.9 - 10.3 mg/dL   Total Protein 7.8 6.5 - 8.1 g/dL   Albumin 4.0 3.5 - 5.0 g/dL   AST 18 15 - 41 U/L   ALT 19 0 - 44 U/L   Alkaline Phosphatase 49 38 - 126 U/L   Total Bilirubin 0.6 0.3 - 1.2 mg/dL   GFR, Estimated 51 (L) >60 mL/min    Comment: (NOTE) Calculated using the CKD-EPI Creatinine Equation (2021)    Anion gap 8 5 - 15    Comment: Performed at [facility], [address] [place], [place] [address]  CBC with  Differential/Platelet     Status: Abnormal   Collection Time: 09/06/22 10:46 AM  Result Value Ref Range   WBC 3.4 (L) 4.0 - 10.5 K/uL   RBC 5.13 (H) 3.87 - 5.11 MIL/uL   Hemoglobin 14.2 12.0 - 15.0 g/dL   HCT 43.3 36.0 - 46.0 %   MCV 84.4 80.0 - 100.0 fL   MCH 27.7 26.0 - 34.0 pg   MCHC 32.8 30.0 - 36.0 g/dL   RDW 13.6 11.5 - 15.5 %   Platelets 362 150 - 400 K/uL   nRBC 0.0 0.0 - 0.2 %   Neutrophils Relative % 50 %   Neutro Abs 1.7 1.7 - 7.7 K/uL   Lymphocytes Relative 39 %   Lymphs Abs 1.3 0.7 - 4.0 K/uL   Monocytes Relative 7 %   Monocytes Absolute 0.3 0.1 - 1.0 K/uL   Eosinophils Relative 3 %   Eosinophils Absolute 0.1 0.0 - 0.5 K/uL   Basophils Relative 1 %   Basophils Absolute 0.0 0.0 - 0.1 K/uL   Immature Granulocytes 0 %   Abs Immature Granulocytes 0.01 0.00 - 0.07 K/uL    Comment: Performed at [facility], [address] [place], [place] [address]  ECHOCARDIOGRAM COMPLETE     Status: None   Collection Time: 09/14/22  3:42 PM  Result Value Ref Range   Area-P 1/2 2.55 cm2   Single Plane A4C EF 59.7 %   P 1/2 time 643 msec  POCT HgB A1C     Status: Abnormal   Collection Time: 11/26/22  2:59 PM  Result Value Ref Range   Hemoglobin A1C 7.7 (A) 4.0 - 5.6 %   HbA1c POC (<> result, manual entry)     HbA1c, POC (prediabetic range)     HbA1c, POC (controlled diabetic range)      Diabetic Foot Exam: Diabetic Foot Exam - Simple   Simple Foot Form Visual Inspection No deformities, no ulcerations, no other skin breakdown bilaterally: Yes Sensation Testing Intact to touch and monofilament testing bilaterally: Yes Pulse Check Posterior Tibialis and Dorsalis pulse intact bilaterally: Yes Comments      PHQ2/9:    11/26/2022    2:58 PM 07/17/2022    3:26 PM 04/02/2022    2:05 PM 03/07/2022    8:27 AM 01/11/2022    9:19 AM  Depression screen PHQ 2/9  Decreased Interest 0 0 0 2 1  Down, Depressed, Hopeless 0 0 0 0 1  PHQ - 2 Score 0 0 0 2 2  Altered sleeping 0  0 0 1 1  Tired, decreased energy 0 0 '3 3 1  '$ Change in appetite 0 0 '2 1 1  '$ Feeling bad or failure about yourself  0 0 1 0 0  Trouble concentrating 0 0 0 0 0  Moving slowly or fidgety/restless 0 0 0 0 0  Suicidal thoughts 0 0 0 0 0  PHQ-9 Score 0 0 '6 7 5  '$ Difficult doing work/chores   Not difficult at all Not difficult at all Somewhat difficult    phq 9 is negative   Fall Risk:    11/26/2022    2:57 PM 07/17/2022    3:25 PM 04/02/2022    2:04 PM 03/07/2022    8:19 AM 01/11/2022    9:19 AM  Fall Risk   Falls in the past year? 0 0 0 0 0  Number falls in past yr: 0  0  0  Injury with Fall? 0  0  0  Risk for fall due to : No Fall Risks No Fall Risks  No Fall Risks No Fall Risks  Follow up Falls prevention discussed Falls prevention discussed;Falls evaluation completed;Education provided  Falls prevention discussed Falls prevention discussed      Functional Status Survey: Is the patient deaf or have difficulty hearing?: No Does the patient have difficulty seeing, even when wearing glasses/contacts?: Yes Does the patient have difficulty concentrating, remembering, or making decisions?: No Does the patient have difficulty walking or climbing stairs?: No Does the patient have difficulty dressing or bathing?: No Does the patient have difficulty doing errands alone such as visiting a doctor's office or shopping?: No    Assessment & Plan  1. Dyslipidemia due to type 2 diabetes mellitus ([place])  - POCT HgB A1C - HM Diabetes Foot Exam - Urine Microalbumin w/creat. ratio - Lipid panel - COMPLETE METABOLIC PANEL WITH GFR - dapagliflozin propanediol (FARXIGA) 10 MG TABS tablet; Take 1 tablet (10 mg total) by mouth daily.  Dispense: 90 tablet; Refill: 1  2. Morbid obesity ([place])  Discussed with the patient the risk posed by an increased BMI. Discussed importance of portion control, calorie counting and at least 150 minutes of physical activity weekly. Avoid sweet beverages and drink more  water. Eat at least 6 servings of fruit and vegetables daily    3. Stage 3a chronic kidney disease ([place])  Check labs   4. Post-surgical hypothyroidism  - TSH  5. Dyslipidemia  - Lipid panel  6. Obstructive apnea   7. Vitamin D deficiency  - VITAMIN D 25 Hydroxy (Vit-D Deficiency, Fractures)  8. Need for pneumococcal vaccine  - Pneumococcal conjugate vaccine 20-valent (Prevnar 20)  9. Need for immunization against influenza  - Flu Vaccine QUAD High Dose(Fluad)  10. Statin myopathy   11. Hypertensive left ventricular hypertrophy, without heart failure  - Olmesartan-amLODIPine-HCTZ 40-5-25 MG TABS; Take 1 tablet by mouth daily at 12 noon.  Dispense: 90 tablet; Refill: 1  12. Mild concentric left ventricular hypertrophy (LVH)   13. Diabetic neuropathy, painful ([facility])  - gabapentin (NEURONTIN) 100 MG capsule; Take 2 capsules (200 mg total) by mouth at bedtime as needed.  Dispense: 180 capsule; Refill: 1   14. Facial lesion  - Ambulatory referral to Dermatology

## 2022-11-26 ENCOUNTER — Ambulatory Visit (INDEPENDENT_AMBULATORY_CARE_PROVIDER_SITE_OTHER): Payer: Medicare HMO | Admitting: Family Medicine

## 2022-11-26 ENCOUNTER — Encounter: Payer: Self-pay | Admitting: Family Medicine

## 2022-11-26 VITALS — BP 126/74 | HR 95 | Resp 16 | Ht 64.0 in | Wt 225.0 lb

## 2022-11-26 DIAGNOSIS — I119 Hypertensive heart disease without heart failure: Secondary | ICD-10-CM

## 2022-11-26 DIAGNOSIS — N1831 Chronic kidney disease, stage 3a: Secondary | ICD-10-CM

## 2022-11-26 DIAGNOSIS — E559 Vitamin D deficiency, unspecified: Secondary | ICD-10-CM

## 2022-11-26 DIAGNOSIS — G720 Drug-induced myopathy: Secondary | ICD-10-CM

## 2022-11-26 DIAGNOSIS — E785 Hyperlipidemia, unspecified: Secondary | ICD-10-CM

## 2022-11-26 DIAGNOSIS — E1169 Type 2 diabetes mellitus with other specified complication: Secondary | ICD-10-CM

## 2022-11-26 DIAGNOSIS — L989 Disorder of the skin and subcutaneous tissue, unspecified: Secondary | ICD-10-CM

## 2022-11-26 DIAGNOSIS — Z23 Encounter for immunization: Secondary | ICD-10-CM | POA: Diagnosis not present

## 2022-11-26 DIAGNOSIS — E890 Postprocedural hypothyroidism: Secondary | ICD-10-CM | POA: Diagnosis not present

## 2022-11-26 DIAGNOSIS — I517 Cardiomegaly: Secondary | ICD-10-CM

## 2022-11-26 DIAGNOSIS — G4733 Obstructive sleep apnea (adult) (pediatric): Secondary | ICD-10-CM

## 2022-11-26 DIAGNOSIS — E1140 Type 2 diabetes mellitus with diabetic neuropathy, unspecified: Secondary | ICD-10-CM

## 2022-11-26 DIAGNOSIS — T466X5A Adverse effect of antihyperlipidemic and antiarteriosclerotic drugs, initial encounter: Secondary | ICD-10-CM

## 2022-11-26 LAB — POCT GLYCOSYLATED HEMOGLOBIN (HGB A1C): Hemoglobin A1C: 7.7 % — AB (ref 4.0–5.6)

## 2022-11-26 MED ORDER — OLMESARTAN-AMLODIPINE-HCTZ 40-5-25 MG PO TABS: 1.0000 | ORAL_TABLET | Freq: Every day | ORAL | 1 refills | Status: DC

## 2022-11-26 MED ORDER — RYBELSUS 14 MG PO TABS: 1.0000 | ORAL_TABLET | Freq: Every day | ORAL | 1 refills | Status: DC

## 2022-11-26 MED ORDER — DAPAGLIFLOZIN PROPANEDIOL 10 MG PO TABS: 10.0000 mg | ORAL_TABLET | Freq: Every day | ORAL | 1 refills | Status: DC

## 2022-11-26 MED ORDER — REPATHA SURECLICK 140 MG/ML ~~LOC~~ SOAJ: 1.0000 | SUBCUTANEOUS | 5 refills | Status: DC

## 2022-11-26 MED ORDER — GABAPENTIN 100 MG PO CAPS: 200.0000 mg | ORAL_CAPSULE | Freq: Every evening | ORAL | 1 refills | Status: DC | PRN

## 2022-11-27 LAB — COMPLETE METABOLIC PANEL WITH GFR
AG Ratio: 1.4 (calc) (ref 1.0–2.5)
ALT: 25 U/L (ref 6–29)
AST: 15 U/L (ref 10–35)
Albumin: 4 g/dL (ref 3.6–5.1)
Alkaline phosphatase (APISO): 49 U/L (ref 37–153)
BUN/Creatinine Ratio: 14 (calc) (ref 6–22)
BUN: 15 mg/dL (ref 7–25)
CO2: 28 mmol/L (ref 20–32)
Calcium: 9.7 mg/dL (ref 8.6–10.4)
Chloride: 107 mmol/L (ref 98–110)
Creat: 1.1 mg/dL — ABNORMAL HIGH (ref 0.50–1.05)
Globulin: 2.8 g/dL (calc) (ref 1.9–3.7)
Glucose, Bld: 89 mg/dL (ref 65–99)
Potassium: 3.6 mmol/L (ref 3.5–5.3)
Sodium: 144 mmol/L (ref 135–146)
Total Bilirubin: 0.6 mg/dL (ref 0.2–1.2)
Total Protein: 6.8 g/dL (ref 6.1–8.1)
eGFR: 56 mL/min/{1.73_m2} — ABNORMAL LOW (ref >=60)

## 2022-11-27 LAB — MICROALBUMIN / CREATININE URINE RATIO
Creatinine, Urine: 162 mg/dL (ref 20–275)
Microalb Creat Ratio: 21 mcg/mg creat (ref <30)
Microalb, Ur: 3.4 mg/dL

## 2022-11-27 LAB — LIPID PANEL
Cholesterol: 181 mg/dL (ref <200)
HDL: 50 mg/dL (ref >=50)
LDL Cholesterol (Calc): 112 mg/dL (calc) — ABNORMAL HIGH
Non-HDL Cholesterol (Calc): 131 mg/dL (calc) — ABNORMAL HIGH (ref <130)
Total CHOL/HDL Ratio: 3.6 (calc) (ref <5.0)
Triglycerides: 91 mg/dL (ref <150)

## 2022-11-27 LAB — VITAMIN D 25 HYDROXY (VIT D DEFICIENCY, FRACTURES): Vit D, 25-Hydroxy: 63 ng/mL (ref 30–100)

## 2022-11-27 LAB — TSH: TSH: 1.12 mIU/L (ref 0.40–4.50)

## 2022-11-28 ENCOUNTER — Encounter: Payer: Self-pay | Admitting: Family Medicine

## 2022-11-29 ENCOUNTER — Other Ambulatory Visit: Payer: Self-pay | Admitting: Family Medicine

## 2022-11-29 DIAGNOSIS — G720 Drug-induced myopathy: Secondary | ICD-10-CM

## 2022-11-29 DIAGNOSIS — E1169 Type 2 diabetes mellitus with other specified complication: Secondary | ICD-10-CM

## 2022-11-29 MED ORDER — PRALUENT 150 MG/ML ~~LOC~~ SOAJ: 1.0000 mL | SUBCUTANEOUS | 5 refills | Status: DC

## 2022-12-04 ENCOUNTER — Encounter: Payer: Self-pay | Admitting: Family Medicine

## 2022-12-05 ENCOUNTER — Ambulatory Visit: Payer: Medicare HMO | Admitting: Medical

## 2022-12-07 ENCOUNTER — Other Ambulatory Visit: Payer: Self-pay | Admitting: Family Medicine

## 2022-12-07 DIAGNOSIS — E1140 Type 2 diabetes mellitus with diabetic neuropathy, unspecified: Secondary | ICD-10-CM

## 2022-12-07 DIAGNOSIS — I119 Hypertensive heart disease without heart failure: Secondary | ICD-10-CM

## 2022-12-07 DIAGNOSIS — E890 Postprocedural hypothyroidism: Secondary | ICD-10-CM

## 2022-12-07 DIAGNOSIS — E785 Hyperlipidemia, unspecified: Secondary | ICD-10-CM

## 2022-12-07 DIAGNOSIS — G720 Drug-induced myopathy: Secondary | ICD-10-CM

## 2022-12-07 MED ORDER — EZETIMIBE 10 MG PO TABS: 10.0000 mg | ORAL_TABLET | Freq: Every day | ORAL | 1 refills | Status: DC

## 2022-12-07 MED ORDER — OLMESARTAN-AMLODIPINE-HCTZ 40-5-25 MG PO TABS: 1.0000 | ORAL_TABLET | Freq: Every day | ORAL | 1 refills | Status: DC

## 2022-12-07 MED ORDER — LEVOTHYROXINE SODIUM 125 MCG PO TABS: 125.0000 ug | ORAL_TABLET | Freq: Every day | ORAL | 0 refills | Status: DC

## 2022-12-07 MED ORDER — RYBELSUS 14 MG PO TABS: 1.0000 | ORAL_TABLET | Freq: Every day | ORAL | 1 refills | Status: DC

## 2022-12-07 MED ORDER — PRALUENT 150 MG/ML ~~LOC~~ SOAJ: 1.0000 mL | SUBCUTANEOUS | 5 refills | Status: DC

## 2022-12-07 MED ORDER — GABAPENTIN 100 MG PO CAPS: 200.0000 mg | ORAL_CAPSULE | Freq: Every evening | ORAL | 1 refills | Status: DC | PRN

## 2022-12-07 MED ORDER — DAPAGLIFLOZIN PROPANEDIOL 10 MG PO TABS: 10.0000 mg | ORAL_TABLET | Freq: Every day | ORAL | 1 refills | Status: DC

## 2022-12-10 DIAGNOSIS — L818 Other specified disorders of pigmentation: Secondary | ICD-10-CM | POA: Diagnosis not present

## 2022-12-10 DIAGNOSIS — L218 Other seborrheic dermatitis: Secondary | ICD-10-CM | POA: Diagnosis not present

## 2022-12-12 NOTE — Progress Notes (Unsigned)
Cardiology Office Note:    Date:  12/13/2022   ID:  Andrea Randolph, DOB July 01, 1956, MRN [id number]  PCP:  Steele Sizer, MD  [facility] Cardiologist:  Ida Rogue, MD  [place] Electrophysiologist:  None   Referring MD: Steele Sizer, MD   Chief Complaint: 6 month follow-up  History of Present Illness:    Andrea Randolph is a 67 y.o. female with a hx of  HTN, hypothyroidism, OSA, HLD, DM2, obesity, mild AI by echo 02/2018, nonobstructive CAD by Cardiac CT in 04/2022, mild LVH by echo 02/2018, Breast cancer s/p lumpectomy presents today for ER follow up.   Previous cardiac workup includes echo 2012 with EF 55-60%, mild AS/AI. Repeat echocardiogram 02/2018 with LVEF 60-65%, mild LVH, normal diastolic parameters, no RWMA, mild aortic regurgitation without aortic stenosis.    She has had difficulty with myalgias while taking statins.    Last seen 06/2021 and was overall doing well from a cardiac perspective.    ER visit 04/02/22 for chest pain. BP was high. HS trop 47>48. CT chest was negative for PE.    She was seen in follow-up 03/31/22 and Cardiac CTA was ordered. This showed coronary calcium score 103, 83rd percentile for age and sex matched, mild LAD stenosis, minimal PDA stenosis, overall mild nonobstructive CAD 25-49%.   Last seen 05/2022 and was stable from a cardiac perspective. Echo was ordered to evaluate mild AI and LVH. Echo showed LVEF 60-65%, no WMA, moderate LVH, LVOT gradient 33mHg, G1DD, mild AI.   Today, the patient reports she is overall doing well. Denies chest pain or SOB. No lower leg edema, orthopnea or pnd. Doesn't need refills of any medications. Patient is on Praluent, but may need to switch for insurance reasons. She does not use CPAP machine, but will ask PCP about this. She had recent blood work.  Past Medical History:  Diagnosis Date   Allergy    Anemia    Arthritis of right shoulder region    Dr. STamala Julian  Cancer ([facility]    breast cancer right side    Chronic insomnia    Diabetes mellitus without complication (HCC)    Family history of adverse reaction to anesthesia    sister with Nausea   GERD (gastroesophageal reflux disease)    Goiter    Dr. SGabriel Carina  Heart murmur    Hyperlipidemia    Hypertension    Hypertensive retinopathy of left eye    Hypothyroidism, adult    Dr. SGabriel Carina  Inflammation of urethra    Menopause    Microalbuminuria    DM   Mixed incontinence    Muscle cramps    Obesity    OSA (obstructive sleep apnea)    Personal history of radiation therapy    Breast Cancer Right breast   Radiculitis, lumbosacral    Vitamin D deficiency     Past Surgical History:  Procedure Laterality Date   BREAST BIOPSY Left 90's   benign core needle    BREAST BIOPSY Right 02/07/2021   [place], Q-clip, path pending    BREAST LUMPECTOMY Right 03/06/2021   BREAST LUMPECTOMY WITH SENTINEL LYMPH NODE BIOPSY Right 03/06/2021   Procedure: BREAST LUMPECTOMY WITH SENTINEL LYMPH NODE BX;  Surgeon: BRobert Bellow MD;  Location: [facility] ORS;  Service: General;  Laterality: Right;   COLONOSCOPY WITH PROPOFOL N/A 01/26/2019   Procedure: COLONOSCOPY WITH PROPOFOL;  Surgeon: VLin Landsman MD;  Location: [facility] ENDOSCOPY;  Service: Gastroenterology;  Laterality: N/A;   COLONOSCOPY WITH PROPOFOL N/A 02/09/2022   Procedure: COLONOSCOPY WITH PROPOFOL;  Surgeon: Jonathon Bellows, MD;  Location: [facility] ENDOSCOPY;  Service: Gastroenterology;  Laterality: N/A;   FOOT SURGERY Bilateral    heel spurs   JOINT REPLACEMENT     TOTAL SHOULDER REPLACEMENT Right 01/25/2015   Dr. Roland Rack   TOTAL THYROIDECTOMY  08/01/2018    Current Medications: Current Meds  Medication Sig   Alirocumab (PRALUENT) 150 MG/ML SOAJ Inject 1 mL into the skin every 14 (fourteen) days.   aspirin 81 MG tablet Take 81 mg by mouth daily.   Calcium Carb-Cholecalciferol (CALCIUM 500+D3 PO) Take 2 tablets by mouth daily.   Cholecalciferol (VITAMIN D3) 1000 units CAPS Take 1,000 Units by  mouth daily.   dapagliflozin propanediol (FARXIGA) 10 MG TABS tablet Take 1 tablet (10 mg total) by mouth daily.   diclofenac Sodium (VOLTAREN) 1 % GEL Apply 2 g topically 4 (four) times daily as needed.   exemestane (AROMASIN) 25 MG tablet TAKE 1 TABLET DAILY   ezetimibe (ZETIA) 10 MG tablet Take 1 tablet (10 mg total) by mouth daily.   gabapentin (NEURONTIN) 100 MG capsule Take 2 capsules (200 mg total) by mouth at bedtime as needed.   glucose blood test strip    ketoconazole (NIZORAL) 2 % cream Apply 1 application topically daily.   levothyroxine (SYNTHROID) 125 MCG tablet Take 1 tablet (125 mcg total) by mouth daily.   Olmesartan-amLODIPine-HCTZ 40-5-25 MG TABS Take 1 tablet by mouth daily at 12 noon.   Semaglutide (RYBELSUS) 14 MG TABS Take 1 tablet (14 mg total) by mouth daily at 6 (six) AM.     Allergies:   Pantoprazole and Rosuvastatin   Social History   Socioeconomic History   Marital status: Legally Separated    Spouse name: Not on file   Number of children: 1   Years of education: Not on file   Highest education level: 12th grade  Occupational History   Occupation: Cytogeneticist  Tobacco Use   Smoking status: Never   Smokeless tobacco: Never  Vaping Use   Vaping Use: Never used  Substance and Sexual Activity   Alcohol use: No    Alcohol/week: 0.0 standard drinks of alcohol   Drug use: No   Sexual activity: Not Currently    Birth control/protection: Post-menopausal  Other Topics Concern   Not on file  Social History Narrative   Separated in around 2009.   She was living with her daughter, however currently moved in with her sister and nephew , but is looking for a house.    Social Determinants of Health   Financial Resource Strain: Low Risk  (01/11/2022)   Overall Financial Resource Strain (CARDIA)    Difficulty of Paying Living Expenses: Not hard at all  Food Insecurity: No Food Insecurity (01/11/2022)   Hunger Vital Sign    Worried About Running Out of  Food in the Last Year: Never true    Ran Out of Food in the Last Year: Never true  Transportation Needs: No Transportation Needs (01/11/2022)   PRAPARE - Hydrologist (Medical): No    Lack of Transportation (Non-Medical): No  Physical Activity: Inactive (01/11/2022)   Exercise Vital Sign    Days of Exercise per Week: 0 days    Minutes of Exercise per Session: 0 min  Stress: No Stress Concern Present (01/11/2022)   [employer]  of Stress : Only a little  Social Connections: Moderately Integrated (01/11/2022)   Social Connection and Isolation Panel [NHANES]    Frequency of Communication with Friends and Family: More than three times a week    Frequency of Social Gatherings with Friends and Family: More than three times a week    Attends Religious Services: 1 to 4 times per year    Active Member of [employer] or Organizations: Yes    Attends Archivist Meetings: 1 to 4 times per year    Marital Status: Separated     Family History: The patient's family history includes Breast cancer (age of onset: 61) in her maternal aunt; CVA in her brother; Diabetes in her sister; Heart disease in her father and mother; Hypertension in her brother, sister, and sister; Kidney disease in her sister; Kidney failure in her sister. There is no history of Ovarian cancer or Colon cancer.  ROS:   Please see the history of present illness.     All other systems reviewed and are negative.  EKGs/Labs/Other Studies Reviewed:    The following studies were reviewed today:  Cardiac CTA 04/2022 IMPRESSION: 1. Coronary calcium score of 103. This was 83rd percentile for age and sex matched control.   2. Normal coronary origin with right dominance.   3. Calcified plaque causing mild proximal LAD stenosis (25%).   4. Calcified plaque causing minimal ostial PDA stenosis (<25%).   5. CAD-RADS 2. Mild  non-obstructive CAD (25-49%). Consider non-atherosclerotic causes of chest pain. Consider preventive therapy and risk factor modification.   Electronically Signed: By: Kate Sable M.D. On: 04/19/2022 14:29  Echo 09/2022 1. Left ventricular ejection fraction, by estimation, is 60 to 65%. The  left ventricle has normal function. The left ventricle has no regional  wall motion abnormalities. There is moderate left ventricular hypertrophy,  LVOT gradient 20 mm Hg, up to 33 mm   Hg with valsalva. Left ventricular diastolic parameters are consistent  with Grade I diastolic dysfunction (impaired relaxation).   2. Right ventricular systolic function is normal. The right ventricular  size is normal. Tricuspid regurgitation signal is inadequate for assessing  PA pressure.   3. The mitral valve is normal in structure. No evidence of mitral valve  regurgitation. No evidence of mitral stenosis.   4. The aortic valve has an indeterminant number of cusps. There is mild  calcification of the aortic valve. Aortic valve regurgitation is mild.  Aortic valve sclerosis/calcification is present, without any evidence of  aortic stenosis.   5. The inferior vena cava is normal in size with greater than 50%  respiratory variability, suggesting right atrial pressure of 3 mmHg.   EKG:  EKG is ordered today.  The ekg ordered today demonstrates NSR 83bpm, TWI lateral leads   Recent Labs: 09/06/2022: Hemoglobin 14.2; Platelets 362 11/26/2022: ALT 25; BUN 15; Creat 1.10; Potassium 3.6; Sodium 144; TSH 1.12  Recent Lipid Panel    Component Value Date/Time   CHOL 181 11/26/2022 1534   CHOL 174 12/21/2015 0856   TRIG 91 11/26/2022 1534   HDL 50 11/26/2022 1534   HDL 60 12/21/2015 0856   CHOLHDL 3.6 11/26/2022 1534   VLDL 9 12/28/2016 1209   LDLCALC 112 (H) 11/26/2022 1534    Physical Exam:    VS:  BP 132/84 (BP Location: Left Arm, Patient Position: Sitting, Cuff Size: Large)   Pulse 83   Ht 5'  4" (1.626 m)   Wt 223 lb 12.8 oz (101.5  kg)   SpO2 99%   BMI 38.42 kg/m     Wt Readings from Last 3 Encounters:  12/13/22 223 lb 12.8 oz (101.5 kg)  11/26/22 225 lb (102.1 kg)  09/06/22 222 lb 8 oz (100.9 kg)     GEN:  Well nourished, well developed in no acute distress HEENT: Normal NECK: No JVD; No carotid bruits LYMPHATICS: No lymphadenopathy CARDIAC: RRR, no murmurs, rubs, gallops RESPIRATORY:  Clear to auscultation without rales, wheezing or rhonchi  ABDOMEN: Soft, non-tender, non-distended MUSCULOSKELETAL:  No edema; No deformity  SKIN: Warm and dry NEUROLOGIC:  Alert and oriented x 3 PSYCHIATRIC:  Normal affect   ASSESSMENT:    1. Coronary artery calcification   2. Mild aortic regurgitation   3. OSA (obstructive sleep apnea)   4. Essential hypertension    PLAN:    In order of problems listed above:  Nonobstructive CAD Cardiac CTA showed mild nonobstructive CAD. Patient denies chest pain. Continue Aspirin, Praluent, Zetia and BB therapy. She may need to change Praluent given insurance issues, she will discuss this with PCP. Most recent LDL 112. No further ischemic work-up indicated at this time.   Mild AI Echo 09/2022 showed normal LVEF with mild AI, moderate LVH, LVOT gradient 59mHG, up to 321m G1DD. No signs of decompensation.   DM2 A1C 7.7. Followed by PCP  OSA Patient reports noncompliance with CPAP due to inability to wear the mask. I offered a pulmonology referral, but she will talk with her PCP.   HTN BP is good today, continue current medications.   Disposition: Follow up in 6 month(s) with MD    Signed, Keyosha Tiedt H Ninfa MeekerPA-C  12/13/2022 11:25 AM    [facility]

## 2022-12-13 ENCOUNTER — Ambulatory Visit: Payer: Medicare HMO | Attending: Medical | Admitting: Medical

## 2022-12-13 ENCOUNTER — Encounter: Payer: Self-pay | Admitting: Medical

## 2022-12-13 VITALS — BP 132/84 | HR 83 | Ht 64.0 in | Wt 223.8 lb

## 2022-12-13 DIAGNOSIS — I351 Nonrheumatic aortic (valve) insufficiency: Secondary | ICD-10-CM

## 2022-12-13 DIAGNOSIS — I2510 Atherosclerotic heart disease of native coronary artery without angina pectoris: Secondary | ICD-10-CM

## 2022-12-13 DIAGNOSIS — E119 Type 2 diabetes mellitus without complications: Secondary | ICD-10-CM

## 2022-12-13 DIAGNOSIS — I2584 Coronary atherosclerosis due to calcified coronary lesion: Secondary | ICD-10-CM | POA: Diagnosis not present

## 2022-12-13 DIAGNOSIS — G4733 Obstructive sleep apnea (adult) (pediatric): Secondary | ICD-10-CM | POA: Diagnosis not present

## 2022-12-13 DIAGNOSIS — I10 Essential (primary) hypertension: Secondary | ICD-10-CM

## 2022-12-13 NOTE — Patient Instructions (Signed)
Medication Instructions:  No changes at this time.   *If you need a refill on your cardiac medications before your next appointment, please call your pharmacy*   Lab Work: None  If you have labs (blood work) drawn today and your tests are completely normal, you will receive your results only by: [place] (if you have MyChart) OR A paper copy in the mail If you have any lab test that is abnormal or we need to change your treatment, we will call you to review the results.   Testing/Procedures: None   Follow-Up: At [facility], you and your health needs are our priority.  As part of our continuing mission to provide you with exceptional heart care, we have created designated Provider Care Teams.  These Care Teams include your primary Cardiologist (physician) and Advanced Practice Providers (APPs -  Physician Assistants and Nurse Practitioners) who all work together to provide you with the care you need, when you need it.   Your next appointment:   6 month(s)  Provider:   Ida Rogue, MD

## 2022-12-19 ENCOUNTER — Telehealth: Payer: Self-pay | Admitting: Family Medicine

## 2022-12-19 NOTE — Telephone Encounter (Signed)
Patient by needing her medication assistance forms completed.  Patient was informed by her insurance representative to bring forms to her PCP to complete.  Please let patient know when forms have been completed and sent or if this will be sent to our pharmacist for completion.

## 2022-12-20 ENCOUNTER — Ambulatory Visit: Payer: Medicare HMO | Attending: Oncology | Admitting: Occupational Therapy

## 2022-12-20 DIAGNOSIS — I890 Lymphedema, not elsewhere classified: Secondary | ICD-10-CM | POA: Insufficient documentation

## 2022-12-20 NOTE — Therapy (Signed)
[facility] [address], [place], [address] Phone: [phone]   Fax:  [phone]  Occupational Therapy Treatment  Patient Details  Name: Andrea Randolph MRN: [id number] Date of Birth: [birth year] Referring Provider (OT): Dr. Tasia Catchings   Encounter Date: 12/20/2022   OT End of Session - 12/20/22 1758     Visit Number 6    Number of Visits 7    Date for OT Re-Evaluation 01/17/23    OT Start Time 1451    OT Stop Time 1526    OT Time Calculation (min) 35 min    Activity Tolerance Patient tolerated treatment well    Behavior During Therapy [facility] for tasks assessed/performed             Past Medical History:  Diagnosis Date   Allergy    Anemia    Arthritis of right shoulder region    Dr. Tamala Julian   [facility])    breast cancer right side   Chronic insomnia    Diabetes mellitus without complication ([place])    Family history of adverse reaction to anesthesia    sister with Nausea   GERD (gastroesophageal reflux disease)    Goiter    Dr. Gabriel Carina   Heart murmur    Hyperlipidemia    Hypertension    Hypertensive retinopathy of left eye    Hypothyroidism, adult    Dr. Gabriel Carina   Inflammation of urethra    Menopause    Microalbuminuria    DM   Mixed incontinence    Muscle cramps    Obesity    OSA (obstructive sleep apnea)    Personal history of radiation therapy    Breast Cancer Right breast   Radiculitis, lumbosacral    Vitamin D deficiency     Past Surgical History:  Procedure Laterality Date   BREAST BIOPSY Left 90's   benign core needle    BREAST BIOPSY Right 02/07/2021   [place] Bx, Q-clip, path pending    BREAST LUMPECTOMY Right 03/06/2021   BREAST LUMPECTOMY WITH SENTINEL LYMPH NODE BIOPSY Right 03/06/2021   Procedure: BREAST LUMPECTOMY WITH SENTINEL LYMPH NODE BX;  Surgeon: Robert Bellow, MD;  Location: [facility] ORS;  Service: General;  Laterality: Right;   COLONOSCOPY WITH PROPOFOL N/A 01/26/2019   Procedure:  COLONOSCOPY WITH PROPOFOL;  Surgeon: Lin Landsman, MD;  Location: [facility] ENDOSCOPY;  Service: Gastroenterology;  Laterality: N/A;   COLONOSCOPY WITH PROPOFOL N/A 02/09/2022   Procedure: COLONOSCOPY WITH PROPOFOL;  Surgeon: Jonathon Bellows, MD;  Location: [facility] ENDOSCOPY;  Service: Gastroenterology;  Laterality: N/A;   FOOT SURGERY Bilateral    heel spurs   JOINT REPLACEMENT     TOTAL SHOULDER REPLACEMENT Right 01/25/2015   Dr. Roland Rack   TOTAL THYROIDECTOMY  08/01/2018    There were no vitals filed for this visit.   Subjective Assessment - 12/20/22 1757     Subjective  I did retired in December.  Loving it.  I did had a shot in that trigger finger.  And then I have not been wearing my compression sleeve about now 2 months since I stop working.  Doing okay I think.  I do wear the compression glove that helps for my hand really because my hand just hurts at times but thumb fingers.    Pertinent History Dr Tasia Catchings - 03/07/22 ASSESSMENT & PLAN:   1. Malignant 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, estrogen receptor positive ([place])   2.  Aromatase inhibitor use   3. Stage 3b chronic kidney disease ([place])    Cancer Staging   Malignant 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, estrogen receptor positive ([place])  Staging form: Breast, AJCC 8th Edition  - Pathologic stage from 03/20/2021: Stage IA (pT1a, pN0, cM0, G2, ER+, PR+, HER2-) - Signed by Earlie Server, MD on 03/20/2021        Right breast invasive carcinoma.  pT1a pN0, ER positive, PR positive, HER2 negative.  Focal DCIS margin positive.Patient declined reexcision.s/p adjuvant radiation.   Unable to tolerate Arimidex  switched to Aromasin.  Labs reviewed and discussed with patient  Continue Aromasin '25mg'$  daily.   01/15/2022  bilateral diagnostic mammogram results were reviewed and discussed with patient.  Continue annual mammogram.  Bone health, 05/16/2021, normal bone density.  Repeat in 2 years.  Recommend calcium 1200 mg daily.  She has  a normal baseline bone density.        Chronic kidney disease, stage III.  Avoid nephrotoxin encourage hydration.   -refer to OT for R UE and thoracic lymphedema    Patient Stated Goals Do not want my arm to get bigger or heavier.    Currently in Pain? No/denies                 LYMPHEDEMA/ONCOLOGY QUESTIONNAIRE - 12/20/22 0001       Right Upper Extremity Lymphedema   15 cm Proximal to Olecranon Process 40.5 cm    10 cm Proximal to Olecranon Process 39.5 cm    Olecranon Process 30.5 cm    15 cm Proximal to Ulnar Styloid Process 30 cm    10 cm Proximal to Ulnar Styloid Process 26.3 cm    Across Hand at [employer] 20.5 cm      Left Upper Extremity Lymphedema   15 cm Proximal to Olecranon Process 41 cm    10 cm Proximal to Olecranon Process 39.5 cm    Olecranon Process 29.5 cm    15 cm Proximal to Ulnar Styloid Process 29 cm    10 cm Proximal to Ulnar Styloid Process 25 cm             Patient returned this date after not being seen for a few months.  Patient retired since about 2 months ago.  Stopped wearing her over-the-counter compression sleeve and glove since retired from [employer]. Upon circumference measurement patient doing very well without her day sleeve. Recommend for patient not to wear her sleeve daily anymore. Possible only for high risk activities.  Patient very active hypertension with a great grandbaby's. Patient reports she still wears compression glove just because of the pain and swelling and issues in the hand. Would recommend for patient to maintain stage I lymphedema on the right upper extremity to wear with high risk activity if needed over-the-counter compression sleeve and glove. Patient is right-hand dominant. Patient do have some trigger finger issues that she had shot in the past. Patient thinking of having surgery in the near future. Patient to follow-up with me to assess fit of new sleeve and glove Patient interested in joining the tai chi  class at the cancer center-info provided.                OT Education - 12/20/22 1758     Education Details progress and home program    Person(s) Educated Patient    Methods Explanation;Demonstration;Tactile cues;Verbal cues;Handout    Comprehension Verbal cues required;Returned demonstration;Verbalized understanding  OT Long Term Goals - 12/20/22 1803       OT LONG TERM GOAL #1   Title Patient to be independent in home program to decrease circumference in the right upper extremity to be fitted for compression garments.    Status Achieved      OT LONG TERM GOAL #2   Title Patient to monitor her for thoracic lymphedema    Status Achieved      OT LONG TERM GOAL #3   Title Patient's right upper extremity decreased in circumference for patient to be fitted with a compression daytimes sleeve and if needed at nighttime to maintain right upper extremity circumference.    Status Achieved      OT LONG TERM GOAL #4   Title Patient to be independent in home program to decrease tenderness over A1 pulleys of right fourth and left thumb as well as decrease triggering during the day    Status Deferred      OT LONG TERM GOAL #5   Title Patient to be independent and wearing of new daytime compression sleeve and glove with no increase symptoms of discomfort.    Baseline Patient previously more than 6- 7 months old.  Patient to be fitted with a new compression sleeve and glove to be wear only with high risk activities.    Time 4    Period Weeks    Status New    Target Date 01/17/23                   Plan - 12/20/22 1759     Clinical Impression Statement Patient referred to OT for evaluation for right upper extremity and thoracic/breast lymphedema.  Patient had lumpectomy in April 22, patient reports 5-6 lymph nodes was removed.  Patient had radiation June 22.  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  After survivorship meetings patient self referred her to Dr. For lymphedema screening with this OT.  At evaluation patient showed increase circumference in right upper extremity 2.4 cm in the upper arm, 1 cm at the elbow, 2 cm in forearm and 1.4 cm at wrist compared to the left upper extremity.  Patient is right-hand dominant and was fitted with a daytime compression sleeve  and glove that she has been wearing since about December of last year when she retired. 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  Patient to returned this date for a follow-up.  Patient has not been wearing her sleeve for about 2 months now since she retired.  Patient doing better with maintaining her stage I lymphedema with being right now.  Would recommend for her to get a replacement sleeve and glove to wear with high risk activities.  Patient active at her church and also with grandbabies and great grand babies and around the house.  Patient to follow-up with me 1 more time after getting new sleeve and glove to assess fitting and wearing of sleeve    OT Occupational Profile and History Problem Focused Assessment - Including review of records relating to presenting problem    Occupational performance deficits (Please refer to evaluation for details): ADL's;IADL's;Work;Play;Leisure;Social Participation    Body Structure / Function / Physical Skills ADL;Edema;Flexibility;Pain    Rehab Potential Good    Clinical Decision Making Limited treatment options, no task modification necessary    Comorbidities  Affecting Occupational Performance: None    Modification or Assistance to Complete Evaluation  No modification of tasks or assist necessary to complete eval    OT Frequency Monthly    OT Duration 4 weeks    OT Treatment/Interventions Self-care/ADL training;Manual lymph drainage;Compression bandaging;DME  and/or AE instruction;Manual Therapy;Patient/family education;Therapeutic exercise;Splinting    Consulted and Agree with Plan of Care Patient             Patient will benefit from skilled therapeutic intervention in order to improve the following deficits and impairments:   Body Structure / Function / Physical Skills: ADL, Edema, Flexibility, Pain       Visit Diagnosis: Lymphedema, not elsewhere classified    Problem List Patient Active Problem List   Diagnosis Date Noted   Anemia in stage 3a chronic kidney disease ([place]) 09/06/2022   Genetic testing 03/28/2022   Stage 3b chronic kidney disease ([place]) 03/07/2022   Aromatase inhibitor use 05/30/2021   Complaints of total body pain 05/30/2021   Malignant 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, estrogen receptor positive ([place]) 02/20/2021   Thrombocytosis 11/01/2019   Carpal tunnel syndrome, bilateral 02/19/2019   Trigger finger of right thumb 02/19/2019   Flexor tenosynovitis of finger 02/19/2019   Severe obesity (BMI 35.0-35.9 with comorbidity) ([place]) 12/29/2018   Mild concentric left ventricular hypertrophy (LVH) 12/24/2017   Mild aortic stenosis by prior echocardiogram 12/24/2017   Mild aortic regurgitation 12/24/2017   CKD (chronic kidney disease), stage I 06/22/2016   Uterine fibroid 01/11/2016   Allergic rhinitis, seasonal 05/24/2015   Benign essential HTN 05/24/2015   Chronic constipation 05/24/2015   Diabetes mellitus with renal manifestation ([place]) 05/24/2015   Dyslipidemia 05/24/2015   Post-surgical hypothyroidism 05/24/2015   Mixed incontinence 05/24/2015   Microalbuminuria 05/24/2015   Menopause 05/24/2015   Obstructive apnea 05/24/2015   Morbid obesity ([place]) 05/24/2015   Hypertensive retinopathy 05/24/2015   Vitamin D deficiency 05/24/2015   Status post total replacement of right shoulder 03/11/2015    Rosalyn Gess, OTR/L,CLT 12/20/2022, 6:04 PM  [facility] [address], [place], [address] Phone: [phone]   Fax:  [phone]  Name: HOLDEN DRAUGHON MRN: [id number] Date of Birth: [birth year]

## 2022-12-26 ENCOUNTER — Other Ambulatory Visit: Payer: Self-pay | Admitting: Family Medicine

## 2022-12-26 DIAGNOSIS — E1169 Type 2 diabetes mellitus with other specified complication: Secondary | ICD-10-CM

## 2022-12-26 DIAGNOSIS — E785 Hyperlipidemia, unspecified: Secondary | ICD-10-CM

## 2022-12-26 NOTE — Telephone Encounter (Signed)
Can you please let the patient know she missed a few signatures on her applications. Paperwork was left at the front desk for her to come by and sign.   Thanks, Malva Limes, [place] Pharmacist Practitioner  [facility] [phone]

## 2022-12-26 NOTE — Telephone Encounter (Signed)
I have received patient assistance forms for her Praluent and Rybelsus. Would you like me to assist with these applications? If so please refer patient to me using [id number].  Thanks, Malva Limes, [place] Pharmacist Practitioner  [facility] [phone]

## 2022-12-26 NOTE — Telephone Encounter (Signed)
Pt called for update on the Rybelsus and Praluent assistance/ please advise

## 2023-01-02 ENCOUNTER — Telehealth: Payer: Self-pay

## 2023-01-02 NOTE — Progress Notes (Signed)
I received a task from Junius Argyle, CPP requesting that I complete only the section regarding the provider for patient assistance on the medication  Rybelsus '14mg'$  daily, and Praluent '150mg'$  every 14 days prescribe by Dr. Ancil Boozer .    Patient has already brought in her part, and gave it to the front desk for the clinical pharmacist to fax over to [place] for [employer] for Rybelsus for processing.  Provider part was completed and sent to the clincal pharmacist to review and fax over.    [place] Pharmacist Assistant [phone]

## 2023-01-03 ENCOUNTER — Encounter: Payer: Self-pay | Admitting: Family Medicine

## 2023-01-04 DIAGNOSIS — I890 Lymphedema, not elsewhere classified: Secondary | ICD-10-CM | POA: Diagnosis not present

## 2023-01-04 NOTE — Telephone Encounter (Unsigned)
Copied from [place] [id number]. Topic: General - Other >> Jan 04, 2023  2:22 PM Everette C wrote: Reason for CRM: The patient has called to follow up with a member of staff regarding ongoing discussions related to their Rybelsus and Praluent prescriptions  Please contact the patient further when possible

## 2023-01-09 NOTE — Telephone Encounter (Signed)
[facility] Patient Assistance form for Rybelsus completed by patient and faxed for review on 01/09/23 [employer] Patient Assistance form for Praluent completed by patient and faxed for review on 01/09/23  There is currently an issue with patient's referral to speak with me. Until that is fixed I will not be able to follow-up with these applications to ensure everything was received and processed correctly. Can you or the patient please check in with [place] in one week to ensure they have everything they need?   Thanks, Malva Limes, [place] Pharmacist Practitioner  [facility] [phone]

## 2023-01-11 NOTE — Progress Notes (Unsigned)
Name: LISEL SPIVA   MRN: [id number]    DOB: 1956/10/02   Date:01/14/2023       Progress Note  Subjective  Chief Complaint  Annual Exam  HPI  Patient presents for annual CPE.  Diet: she has been avoiding fried food for lent, she is cutting down on carbohydrates  Exercise:  she has a membership to the gym but not using it, explained importance of regular physical activity  Last Eye Exam: up to date  Last Dental Exam: up to date   [employer] Visit from 01/14/2023 in [facility]  AUDIT-C Score 0      Depression: Phq 9 is  negative    01/14/2023    8:21 AM 11/26/2022    2:58 PM 07/17/2022    3:26 PM 04/02/2022    2:05 PM 03/07/2022    8:27 AM  Depression screen PHQ 2/9  Decreased Interest 1 0 0 0 2  Down, Depressed, Hopeless 0 0 0 0 0  PHQ - 2 Score 1 0 0 0 2  Altered sleeping 1 0 0 0 1  Tired, decreased energy 1 0 0 3 3  Change in appetite 0 0 0 2 1  Feeling bad or failure about yourself  0 0 0 1 0  Trouble concentrating 0 0 0 0 0  Moving slowly or fidgety/restless 0 0 0 0 0  Suicidal thoughts 0 0 0 0 0  PHQ-9 Score 3 0 0 6 7  Difficult doing work/chores Not difficult at all   Not difficult at all Not difficult at all   Hypertension: BP Readings from Last 3 Encounters:  01/14/23 130/82  12/13/22 132/84  11/26/22 126/74   Obesity: Wt Readings from Last 3 Encounters:  01/14/23 225 lb 11.2 oz (102.4 kg)  12/13/22 223 lb 12.8 oz (101.5 kg)  11/26/22 225 lb (102.1 kg)   BMI Readings from Last 3 Encounters:  01/14/23 38.74 kg/m  12/13/22 38.42 kg/m  11/26/22 38.62 kg/m     Vaccines:   HPV: N/A Tdap: up to date Shingrix: up to date Pneumonia: up to date Flu: up to date COVID-56: refused    Hep C Screening: 12/24/17 STD testing and prevention (HIV/chl/gon/syphilis): 12/24/17 Intimate partner violence: negative screen  Sexual History : not sexually active for a long time  Menstrual History/LMP/Abnormal Bleeding: post-menopause   Discussed importance of follow up if any post-menopausal bleeding: yes  Incontinence Symptoms: negative for symptoms   Breast cancer:  - Last Mammogram: Scheduled for 01/17/23 - BRCA gene screening: personal history of right breast cancer , sees Dr. Tasia Catchings  Osteoporosis Prevention : Discussed high calcium and vitamin D supplementation, weight bearing exercises Bone density: 05/16/21 , we will recheck this year after welcome to medicare   Cervical cancer screening: today   Skin cancer: Discussed monitoring for atypical lesions  Colorectal cancer: 02/09/22   Lung cancer:  Low Dose CT Chest recommended if Age 59-80 years, 20 pack-year currently smoking OR have quit w/in 15years. Patient does not qualify for screen   ECG: 12/13/22  Advanced Care Planning: A voluntary discussion about advance care planning including the explanation and discussion of advance directives.  Discussed health care proxy and Living will, and the patient was able to identify a health care proxy as daughter .  Patient does not have a living will and power of attorney of health care   Lipids: Lab Results  Component Value Date   CHOL 181 11/26/2022   CHOL  158 03/07/2022   CHOL 255 (H) 12/06/2021   Lab Results  Component Value Date   HDL 50 11/26/2022   HDL 47 (L) 03/07/2022   HDL 49 (L) 12/06/2021   Lab Results  Component Value Date   LDLCALC 112 (H) 11/26/2022   LDLCALC 97 03/07/2022   LDLCALC 189 (H) 12/06/2021   Lab Results  Component Value Date   TRIG 91 11/26/2022   TRIG 62 03/07/2022   TRIG 65 12/06/2021   Lab Results  Component Value Date   CHOLHDL 3.6 11/26/2022   CHOLHDL 3.4 03/07/2022   CHOLHDL 5.2 (H) 12/06/2021   No results found for: "LDLDIRECT"  Glucose: Glucose, Bld  Date Value Ref Range Status  11/26/2022 89 65 - 99 mg/dL Final    Comment:    .            Fasting reference interval .   09/06/2022 112 (H) 70 - 99 mg/dL Final    Comment:    Glucose reference range applies only to  samples taken after fasting for at least 8 hours.  04/02/2022 151 (H) 70 - 99 mg/dL Final    Comment:    Glucose reference range applies only to samples taken after fasting for at least 8 hours.   Glucose-Capillary  Date Value Ref Range Status  04/02/2022 169 (H) 70 - 99 mg/dL Final    Comment:    Glucose reference range applies only to samples taken after fasting for at least 8 hours.  02/09/2022 119 (H) 70 - 99 mg/dL Final    Comment:    Glucose reference range applies only to samples taken after fasting for at least 8 hours.  03/06/2021 119 (H) 70 - 99 mg/dL Final    Comment:    Glucose reference range applies only to samples taken after fasting for at least 8 hours.    Patient Active Problem List   Diagnosis Date Noted   Anemia in stage 3a chronic kidney disease ([place]) 09/06/2022   Genetic testing 03/28/2022   Stage 3b chronic kidney disease ([place]) 03/07/2022   Aromatase inhibitor use 05/30/2021   Complaints of total body pain 05/30/2021   Malignant 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, estrogen receptor positive ([place]) 02/20/2021   Thrombocytosis 11/01/2019   Carpal tunnel syndrome, bilateral 02/19/2019   Trigger finger of right thumb 02/19/2019   Flexor tenosynovitis of finger 02/19/2019   Severe obesity (BMI 35.0-35.9 with comorbidity) ([place]) 12/29/2018   Mild concentric left ventricular hypertrophy (LVH) 12/24/2017   Mild aortic stenosis by prior echocardiogram 12/24/2017   Mild aortic regurgitation 12/24/2017   CKD (chronic kidney disease), stage I 06/22/2016   Uterine fibroid 01/11/2016   Allergic rhinitis, seasonal 05/24/2015   Benign essential HTN 05/24/2015   Chronic constipation 05/24/2015   Diabetes mellitus with renal manifestation ([place]) 05/24/2015   Dyslipidemia 05/24/2015   Post-surgical hypothyroidism 05/24/2015   Mixed incontinence 05/24/2015   Microalbuminuria 05/24/2015   Menopause 05/24/2015   Obstructive apnea 05/24/2015   Morbid  obesity ([place]) 05/24/2015   Hypertensive retinopathy 05/24/2015   Vitamin D deficiency 05/24/2015   Status post total replacement of right shoulder 03/11/2015    Past Surgical History:  Procedure Laterality Date   BREAST BIOPSY Left 90's   benign core needle    BREAST BIOPSY Right 02/07/2021   [place] Bx, Q-clip, path pending    BREAST LUMPECTOMY Right 03/06/2021   BREAST LUMPECTOMY WITH SENTINEL LYMPH NODE BIOPSY Right 03/06/2021   Procedure: BREAST LUMPECTOMY  WITH SENTINEL LYMPH NODE BX;  Surgeon: Robert Bellow, MD;  Location: [facility] ORS;  Service: General;  Laterality: Right;   COLONOSCOPY WITH PROPOFOL N/A 01/26/2019   Procedure: COLONOSCOPY WITH PROPOFOL;  Surgeon: Lin Landsman, MD;  Location: [facility] ENDOSCOPY;  Service: Gastroenterology;  Laterality: N/A;   COLONOSCOPY WITH PROPOFOL N/A 02/09/2022   Procedure: COLONOSCOPY WITH PROPOFOL;  Surgeon: Jonathon Bellows, MD;  Location: [facility] ENDOSCOPY;  Service: Gastroenterology;  Laterality: N/A;   FOOT SURGERY Bilateral    heel spurs   JOINT REPLACEMENT     TOTAL SHOULDER REPLACEMENT Right 01/25/2015   Dr. Roland Rack   TOTAL THYROIDECTOMY  08/01/2018    Family History  Problem Relation Age of Onset   Heart disease Mother    Heart disease Father    Kidney disease Sister    Hypertension Sister    Diabetes Sister    Hypertension Sister    Kidney failure Sister    Hypertension Brother    CVA Brother    Breast cancer Maternal Aunt 71   Ovarian cancer Neg Hx    Colon cancer Neg Hx     Social History   Socioeconomic History   Marital status: Legally Separated    Spouse name: Not on file   Number of children: 1   Years of education: Not on file   Highest education level: 12th grade  Occupational History   Occupation: Cytogeneticist  Tobacco Use   Smoking status: Never   Smokeless tobacco: Never  Vaping Use   Vaping Use: Never used  Substance and Sexual Activity   Alcohol use: No    Alcohol/week: 0.0 standard drinks of  alcohol   Drug use: No   Sexual activity: Not Currently    Birth control/protection: Post-menopausal  Other Topics Concern   Not on file  Social History Narrative   Separated in around 2009.   She was living with her daughter, however currently moved in with her sister and nephew , but is looking for a house.    Social Determinants of Health   Financial Resource Strain: Low Risk  (01/14/2023)   Overall Financial Resource Strain (CARDIA)    Difficulty of Paying Living Expenses: Not hard at all  Food Insecurity: No Food Insecurity (01/14/2023)   Hunger Vital Sign    Worried About Running Out of Food in the Last Year: Never true    Ran Out of Food in the Last Year: Never true  Transportation Needs: No Transportation Needs (01/14/2023)   PRAPARE - Hydrologist (Medical): No    Lack of Transportation (Non-Medical): No  Physical Activity: Inactive (01/14/2023)   Exercise Vital Sign    Days of Exercise per Week: 0 days    Minutes of Exercise per Session: 0 min  Stress: Stress Concern Present (01/14/2023)   [place]    Feeling of Stress : To some extent  Social Connections: Moderately Integrated (01/14/2023)   Social Connection and Isolation Panel [NHANES]    Frequency of Communication with Friends and Family: More than three times a week    Frequency of Social Gatherings with Friends and Family: More than three times a week    Attends Religious Services: More than 4 times per year    Active Member of [employer] or Organizations: Yes    Attends Archivist Meetings: More than 4 times per year    Marital Status: Separated  Intimate Partner Violence: Not  At Risk (01/14/2023)   Humiliation, Afraid, Rape, and Kick questionnaire    Fear of Current or Ex-Partner: No    Emotionally Abused: No    Physically Abused: No    Sexually Abused: No     Current Outpatient Medications:    Alirocumab (PRALUENT)  150 MG/ML SOAJ, Inject 1 mL into the skin every 14 (fourteen) days., Disp: 2 mL, Rfl: 5   aspirin 81 MG tablet, Take 81 mg by mouth daily., Disp: , Rfl:    Calcium Carb-Cholecalciferol (CALCIUM 500+D3 PO), Take 2 tablets by mouth daily., Disp: , Rfl:    Cholecalciferol (VITAMIN D3) 1000 units CAPS, Take 1,000 Units by mouth daily., Disp: , Rfl:    dapagliflozin propanediol (FARXIGA) 10 MG TABS tablet, Take 1 tablet (10 mg total) by mouth daily., Disp: 90 tablet, Rfl: 1   diclofenac Sodium (VOLTAREN) 1 % GEL, Apply 2 g topically 4 (four) times daily as needed., Disp: 100 g, Rfl: 2   exemestane (AROMASIN) 25 MG tablet, TAKE 1 TABLET DAILY, Disp: 90 tablet, Rfl: 3   ezetimibe (ZETIA) 10 MG tablet, Take 1 tablet (10 mg total) by mouth daily., Disp: 90 tablet, Rfl: 1   gabapentin (NEURONTIN) 100 MG capsule, Take 2 capsules (200 mg total) by mouth at bedtime as needed., Disp: 180 capsule, Rfl: 1   glucose blood test strip, , Disp: , Rfl:    ketoconazole (NIZORAL) 2 % cream, Apply 1 application topically daily., Disp: 60 g, Rfl: 0   levothyroxine (SYNTHROID) 125 MCG tablet, Take 1 tablet (125 mcg total) by mouth daily., Disp: 90 tablet, Rfl: 0   Olmesartan-amLODIPine-HCTZ 40-5-25 MG TABS, Take 1 tablet by mouth daily at 12 noon., Disp: 90 tablet, Rfl: 1   Semaglutide (RYBELSUS) 14 MG TABS, Take 1 tablet (14 mg total) by mouth daily at 6 (six) AM., Disp: 90 tablet, Rfl: 1  Allergies  Allergen Reactions   Pantoprazole Other (See Comments)    Affects thyroid    Rosuvastatin Other (See Comments)    Myalgia      ROS  Constitutional: Negative for fever or weight change.  Respiratory: Negative for cough and shortness of breath.   Cardiovascular: Negative for chest pain or palpitations.  Gastrointestinal: Negative for abdominal pain, no bowel changes.  Musculoskeletal: Negative for gait problem or joint swelling.  Skin: Negative for rash.  Neurological: Negative for dizziness or headache.  No other  specific complaints in a complete review of systems (except as listed in HPI above).   Objective  Vitals:   01/14/23 0819  BP: 130/82  Pulse: 85  Resp: 16  Temp: 97.8 F (36.6 C)  TempSrc: Oral  SpO2: 98%  Weight: 225 lb 11.2 oz (102.4 kg)  Height: '5\' 4"'$  (1.626 m)    Body mass index is 38.74 kg/m.  Physical Exam  Constitutional: Patient appears well-developed and obese  No distress.  HENT: Head: Normocephalic and atraumatic. Ears: B TMs ok, no erythema or effusion; Nose: Nose normal. Mouth/Throat: Oropharynx is clear and moist. No oropharyngeal exudate.  Eyes: Conjunctivae and EOM are normal. Pupils are equal, round, and reactive to light. No scleral icterus.  Neck: Normal range of motion. Neck supple. No JVD present. No thyromegaly present.  Cardiovascular: Normal rate, regular rhythm and normal heart sounds.  No murmur heard. No BLE edema. Pulmonary/Chest: Effort normal and breath sounds normal. No respiratory distress. Abdominal: Soft. Bowel sounds are normal, no distension. There is no tenderness. no masses Breast: no lumps or masses, no nipple  discharge or rashes FEMALE GENITALIA:  External genitalia normal External urethra normal Vaginal vault normal without discharge or lesions Cervix normal without discharge or lesions Bimanual exam normal without masses RECTAL: not done  Musculoskeletal: Normal range of motion, no joint effusions. No gross deformities Neurological: he is alert and oriented to person, place, and time. No cranial nerve deficit. Coordination, balance, strength, speech and gait are normal.  Skin: Skin is warm and dry. No rash noted. No erythema.  Psychiatric: Patient has a normal mood and affect. behavior is normal. Judgment and thought content normal.   Recent Results (from the past 2160 hour(s))  POCT HgB A1C     Status: Abnormal   Collection Time: 11/26/22  2:59 PM  Result Value Ref Range   Hemoglobin A1C 7.7 (A) 4.0 - 5.6 %   HbA1c POC (<>  result, manual entry)     HbA1c, POC (prediabetic range)     HbA1c, POC (controlled diabetic range)    Urine Microalbumin w/creat. ratio     Status: None   Collection Time: 11/26/22  3:34 PM  Result Value Ref Range   Creatinine, Urine 162 20 - 275 mg/dL   Microalb, Ur 3.4 mg/dL    Comment: Reference Range Not established    Microalb Creat Ratio 21 <30 mcg/mg creat    Comment: . The ADA defines abnormalities in albumin excretion as follows: Marland Kitchen Albuminuria Category        Result (mcg/mg creatinine) . Normal to Mildly increased   <30 Moderately increased         30-299  Severely increased           > OR = 300 . The ADA recommends that at least two of three specimens collected within a 3-6 month period be abnormal before considering a patient to be within a diagnostic category.   Lipid panel     Status: Abnormal   Collection Time: 11/26/22  3:34 PM  Result Value Ref Range   Cholesterol 181 <200 mg/dL   HDL 50 > OR = 50 mg/dL   Triglycerides 91 <150 mg/dL   LDL Cholesterol (Calc) 112 (H) mg/dL (calc)    Comment: Reference range: <100 . Desirable range <100 mg/dL for primary prevention;   <70 mg/dL for patients with CHD or diabetic patients  with > or = 2 CHD risk factors. Marland Kitchen LDL-C is now calculated using the Martin-Hopkins  calculation, which is a validated novel method providing  better accuracy than the Friedewald equation in the  estimation of LDL-C.  Cresenciano Genre et al. Annamaria Helling. [id number]): 2061-2068  (http://education.[facility].com/faq/FAQ164)    Total CHOL/HDL Ratio 3.6 <5.0 (calc)   Non-HDL Cholesterol (Calc) 131 (H) <130 mg/dL (calc)    Comment: For patients with diabetes plus 1 major ASCVD risk  factor, treating to a non-HDL-C goal of <100 mg/dL  (LDL-C of <70 mg/dL) is considered a therapeutic  option.   COMPLETE METABOLIC PANEL WITH GFR     Status: Abnormal   Collection Time: 11/26/22  3:34 PM  Result Value Ref Range   Glucose, Bld 89 65 - 99 mg/dL     Comment: .            Fasting reference interval .    BUN 15 7 - 25 mg/dL   Creat 1.10 (H) 0.50 - 1.05 mg/dL   eGFR 56 (L) > OR = 60 mL/min/1.80m   BUN/Creatinine Ratio 14 6 - 22 (calc)   Sodium 144 135 - 146 mmol/L  Potassium 3.6 3.5 - 5.3 mmol/L   Chloride 107 98 - 110 mmol/L   CO2 28 20 - 32 mmol/L   Calcium 9.7 8.6 - 10.4 mg/dL   Total Protein 6.8 6.1 - 8.1 g/dL   Albumin 4.0 3.6 - 5.1 g/dL   Globulin 2.8 1.9 - 3.7 g/dL (calc)   AG Ratio 1.4 1.0 - 2.5 (calc)   Total Bilirubin 0.6 0.2 - 1.2 mg/dL   Alkaline phosphatase (APISO) 49 37 - 153 U/L   AST 15 10 - 35 U/L   ALT 25 6 - 29 U/L  TSH     Status: None   Collection Time: 11/26/22  3:34 PM  Result Value Ref Range   TSH 1.12 0.40 - 4.50 mIU/L  VITAMIN D 25 Hydroxy (Vit-D Deficiency, Fractures)     Status: None   Collection Time: 11/26/22  3:34 PM  Result Value Ref Range   Vit D, 25-Hydroxy 63 30 - 100 ng/mL    Comment: Vitamin D Status         25-OH Vitamin D: . Deficiency:                    <20 ng/mL Insufficiency:             20 - 29 ng/mL Optimal:                 > or = 30 ng/mL . For 25-OH Vitamin D testing on patients on  D2-supplementation and patients for whom quantitation  of D2 and D3 fractions is required, the [facility]ure[facility](TM) 25-OH VIT D, (D2,D3), LC/MS/MS is recommended: order  code [id number] (patients >76yr). . See Note 1 . Note 1 . For additional information, please refer to  http://education.[facility].com/faq/FAQ199  (This link is being provided for informational/ educational purposes only.)      Fall Risk:    01/14/2023    8:20 AM 11/26/2022    2:57 PM 07/17/2022    3:25 PM 04/02/2022    2:04 PM 03/07/2022    8:19 AM  Fall Risk   Falls in the past year? 0 0 0 0 0  Number falls in past yr:  0  0   Injury with Fall?  0  0   Risk for fall due to : No Fall Risks No Fall Risks No Fall Risks  No Fall Risks  Follow up Falls prevention discussed;Education provided;Falls evaluation completed  Falls prevention discussed Falls prevention discussed;Falls evaluation completed;Education provided  Falls prevention discussed     Functional Status Survey: Is the patient deaf or have difficulty hearing?: No Does the patient have difficulty seeing, even when wearing glasses/contacts?: Yes (night driving) Does the patient have difficulty concentrating, remembering, or making decisions?: No Does the patient have difficulty walking or climbing stairs?: No Does the patient have difficulty dressing or bathing?: No Does the patient have difficulty doing errands alone such as visiting a doctor's office or shopping?: No   Assessment & Plan  1. Well adult exam   2. Cervical cancer screening  - Cytology - PAP   -USPSTF grade A and B recommendations reviewed with patient; age-appropriate recommendations, preventive care, screening tests, etc discussed and encouraged; healthy living encouraged; see AVS for patient education given to patient -Discussed importance of 150 minutes of physical activity weekly, eat two servings of fish weekly, eat one serving of tree nuts ( cashews, pistachios, pecans, almonds..Marland Kitchen every other day, eat 6 servings of fruit/vegetables daily and drink plenty of water and  avoid sweet beverages.   -Reviewed Health Maintenance: Yes.

## 2023-01-11 NOTE — Patient Instructions (Incomplete)
Preventive Care 65 Years and Older, Female Preventive care refers to lifestyle choices and visits with your health care provider that can promote health and wellness. Preventive care visits are also called wellness exams. What can I expect for my preventive care visit? Counseling Your health care provider may ask you questions about your: Medical history, including: Past medical problems. Family medical history. Pregnancy and menstrual history. History of falls. Current health, including: Memory and ability to understand (cognition). Emotional well-being. Home life and relationship well-being. Sexual activity and sexual health. Lifestyle, including: Alcohol, nicotine or tobacco, and drug use. Access to firearms. Diet, exercise, and sleep habits. Work and work environment. Sunscreen use. Safety issues such as seatbelt and bike helmet use. Physical exam Your health care provider will check your: Height and weight. These may be used to calculate your BMI (body mass index). BMI is a measurement that tells if you are at a healthy weight. Waist circumference. This measures the distance around your waistline. This measurement also tells if you are at a healthy weight and may help predict your risk of certain diseases, such as type 2 diabetes and high blood pressure. Heart rate and blood pressure. Body temperature. Skin for abnormal spots. What immunizations do I need?  Vaccines are usually given at various ages, according to a schedule. Your health care provider will recommend vaccines for you based on your age, medical history, and lifestyle or other factors, such as travel or where you work. What tests do I need? Screening Your health care provider may recommend screening tests for certain conditions. This may include: Lipid and cholesterol levels. Hepatitis C test. Hepatitis B test. HIV (human immunodeficiency virus) test. STI (sexually transmitted infection) testing, if you are at  risk. Lung cancer screening. Colorectal cancer screening. Diabetes screening. This is done by checking your blood sugar (glucose) after you have not eaten for a while (fasting). Mammogram. Talk with your health care provider about how often you should have regular mammograms. BRCA-related cancer screening. This may be done if you have a family history of breast, ovarian, tubal, or peritoneal cancers. Bone density scan. This is done to screen for osteoporosis. Talk with your health care provider about your test results, treatment options, and if necessary, the need for more tests. Follow these instructions at home: Eating and drinking  Eat a diet that includes fresh fruits and vegetables, whole grains, lean protein, and low-fat dairy products. Limit your intake of foods with high amounts of sugar, saturated fats, and salt. Take vitamin and mineral supplements as recommended by your health care provider. Do not drink alcohol if your health care provider tells you not to drink. If you drink alcohol: Limit how much you have to 0-1 drink a day. Know how much alcohol is in your drink. In the [place] one drink equals one 12 oz bottle of beer (355 mL), one 5 oz glass of wine (148 mL), or one 1 oz glass of hard liquor (44 mL). Lifestyle Brush your teeth every morning and night with fluoride toothpaste. Floss one time each day. Exercise for at least 30 minutes 5 or more days each week. Do not use any products that contain nicotine or tobacco. These products include cigarettes, chewing tobacco, and vaping devices, such as e-cigarettes. If you need help quitting, ask your health care provider. Do not use drugs. If you are sexually active, practice safe sex. Use a condom or other form of protection in order to prevent STIs. Take aspirin only as told by   your health care provider. Make sure that you understand how much to take and what form to take. Work with your health care provider to find out whether it  is safe and beneficial for you to take aspirin daily. Ask your health care provider if you need to take a cholesterol-lowering medicine (statin). Find healthy ways to manage stress, such as: Meditation, yoga, or listening to music. Journaling. Talking to a trusted person. Spending time with friends and family. Minimize exposure to UV radiation to reduce your risk of skin cancer. Safety Always wear your seat belt while driving or riding in a vehicle. Do not drive: If you have been drinking alcohol. Do not ride with someone who has been drinking. When you are tired or distracted. While texting. If you have been using any mind-altering substances or drugs. Wear a helmet and other protective equipment during sports activities. If you have firearms in your house, make sure you follow all gun safety procedures. What's next? Visit your health care provider once a year for an annual wellness visit. Ask your health care provider how often you should have your eyes and teeth checked. Stay up to date on all vaccines. This information is not intended to replace advice given to you by your health care provider. Make sure you discuss any questions you have with your health care provider. Document Revised: 04/26/2021 Document Reviewed: 04/26/2021 Elsevier Patient Education  2023 Elsevier Inc.   

## 2023-01-14 ENCOUNTER — Ambulatory Visit (INDEPENDENT_AMBULATORY_CARE_PROVIDER_SITE_OTHER): Payer: Medicare HMO | Admitting: Family Medicine

## 2023-01-14 ENCOUNTER — Encounter: Payer: Self-pay | Admitting: Family Medicine

## 2023-01-14 ENCOUNTER — Other Ambulatory Visit (HOSPITAL_COMMUNITY)
Admission: RE | Admit: 2023-01-14 | Discharge: 2023-01-14 | Disposition: A | Discharge status: Home / Self Care | Payer: Medicare HMO | Source: Ambulatory Visit | Attending: Family Medicine | Admitting: Family Medicine

## 2023-01-14 VITALS — BP 130/82 | HR 85 | Temp 97.8°F | Resp 16 | Ht 64.0 in | Wt 225.7 lb

## 2023-01-14 DIAGNOSIS — Z124 Encounter for screening for malignant neoplasm of cervix: Secondary | ICD-10-CM | POA: Insufficient documentation

## 2023-01-14 DIAGNOSIS — Z0000 Encounter for general adult medical examination without abnormal findings: Secondary | ICD-10-CM

## 2023-01-14 DIAGNOSIS — Z1151 Encounter for screening for human papillomavirus (HPV): Secondary | ICD-10-CM | POA: Insufficient documentation

## 2023-01-14 DIAGNOSIS — R69 Illness, unspecified: Secondary | ICD-10-CM | POA: Diagnosis not present

## 2023-01-14 DIAGNOSIS — Z01419 Encounter for gynecological examination (general) (routine) without abnormal findings: Secondary | ICD-10-CM | POA: Diagnosis present

## 2023-01-15 LAB — CYTOLOGY - PAP
Comment: NEGATIVE
Diagnosis: NEGATIVE
High risk HPV: NEGATIVE

## 2023-01-17 ENCOUNTER — Ambulatory Visit
Admission: RE | Admit: 2023-01-17 | Discharge: 2023-01-17 | Disposition: A | Discharge status: Home / Self Care | Payer: Medicare HMO | Source: Ambulatory Visit | Attending: Oncology | Admitting: Oncology

## 2023-01-17 DIAGNOSIS — C50211 Malignant neoplasm of upper-inner quadrant of right female breast: Secondary | ICD-10-CM | POA: Diagnosis not present

## 2023-01-17 DIAGNOSIS — Z170 Estrogen receptor positive status [ER+]: Secondary | ICD-10-CM | POA: Diagnosis not present

## 2023-01-17 DIAGNOSIS — R928 Other abnormal and inconclusive findings on diagnostic imaging of breast: Secondary | ICD-10-CM | POA: Diagnosis not present

## 2023-01-21 DIAGNOSIS — L218 Other seborrheic dermatitis: Secondary | ICD-10-CM | POA: Diagnosis not present

## 2023-01-21 DIAGNOSIS — L818 Other specified disorders of pigmentation: Secondary | ICD-10-CM | POA: Diagnosis not present

## 2023-01-28 ENCOUNTER — Other Ambulatory Visit: Payer: Self-pay

## 2023-01-28 ENCOUNTER — Telehealth: Payer: Self-pay | Admitting: Family Medicine

## 2023-01-28 DIAGNOSIS — I119 Hypertensive heart disease without heart failure: Secondary | ICD-10-CM

## 2023-01-28 DIAGNOSIS — G720 Drug-induced myopathy: Secondary | ICD-10-CM

## 2023-01-28 DIAGNOSIS — E890 Postprocedural hypothyroidism: Secondary | ICD-10-CM

## 2023-01-28 DIAGNOSIS — E1169 Type 2 diabetes mellitus with other specified complication: Secondary | ICD-10-CM

## 2023-01-28 MED ORDER — OLMESARTAN-AMLODIPINE-HCTZ 40-5-25 MG PO TABS: 1.0000 | ORAL_TABLET | Freq: Every day | ORAL | 1 refills | Status: DC

## 2023-01-28 MED ORDER — LEVOTHYROXINE SODIUM 125 MCG PO TABS: 125.0000 ug | ORAL_TABLET | Freq: Every day | ORAL | 0 refills | Status: DC

## 2023-01-28 MED ORDER — PRALUENT 150 MG/ML ~~LOC~~ SOAJ: 1.0000 mL | SUBCUTANEOUS | 1 refills | Status: DC

## 2023-01-28 MED ORDER — DAPAGLIFLOZIN PROPANEDIOL 10 MG PO TABS: 10.0000 mg | ORAL_TABLET | Freq: Every day | ORAL | 1 refills | Status: DC

## 2023-01-28 NOTE — Telephone Encounter (Signed)
Sonia Baller from [facility] is calling in because she sent over some refill requests via fax and haven't heard anything back. She says it is about five medications. Please follow up with Sonia Baller. [phone]

## 2023-01-28 NOTE — Telephone Encounter (Signed)
Spoke with patient and she stated she would like to send all her medications to [facility]. Tee'd up and sent to Dr. Ancil Boozer. Awaiting approval.

## 2023-01-29 NOTE — Progress Notes (Unsigned)
Patient: Andrea Randolph, Female    DOB: 05/15/1956, 67 y.o.   MRN: [id number]  Visit Date: 01/30/2023  Today's Provider: Loistine Chance, MD   Welcome to Medicare  Subjective:    HPI Andrea Randolph is a 67 y.o. female who presents today for her Subsequent Annual Wellness Visit.  Patient/Caregiver input:    Review of Systems  Constitutional: Negative for fever or weight change.  Respiratory: Negative for cough and shortness of breath.   Cardiovascular: Negative for chest pain or palpitations.  Gastrointestinal: Negative for abdominal pain, no bowel changes.  Musculoskeletal: Negative for gait problem or joint swelling.  Skin: Negative for rash.  Neurological: Negative for dizziness or headache.  No other specific complaints in a complete review of systems (except as listed in HPI above).  Past Medical History:  Diagnosis Date   Allergy    Anemia    Arthritis of right shoulder region    Dr. Tamala Julian   Cancer [facility])    breast cancer right side   Chronic insomnia    Diabetes mellitus without complication (HCC)    Family history of adverse reaction to anesthesia    sister with Nausea   GERD (gastroesophageal reflux disease)    Goiter    Dr. Gabriel Carina   Heart murmur    Hyperlipidemia    Hypertension    Hypertensive retinopathy of left eye    Hypothyroidism, adult    Dr. Gabriel Carina   Inflammation of urethra    Menopause    Microalbuminuria    DM   Mixed incontinence    Muscle cramps    Obesity    OSA (obstructive sleep apnea)    Personal history of radiation therapy    Breast Cancer Right breast   Radiculitis, lumbosacral    Vitamin D deficiency     Past Surgical History:  Procedure Laterality Date   BREAST BIOPSY Left 90's   benign core needle    BREAST BIOPSY Right 02/07/2021   [place] Bx, Q-clip, DCIS   BREAST LUMPECTOMY Right 03/06/2021   BREAST LUMPECTOMY WITH SENTINEL LYMPH NODE BIOPSY Right 03/06/2021   Procedure: BREAST LUMPECTOMY WITH SENTINEL LYMPH NODE BX;  Surgeon:  Robert Bellow, MD;  Location: [facility] ORS;  Service: General;  Laterality: Right;   COLONOSCOPY WITH PROPOFOL N/A 01/26/2019   Procedure: COLONOSCOPY WITH PROPOFOL;  Surgeon: Lin Landsman, MD;  Location: [facility] ENDOSCOPY;  Service: Gastroenterology;  Laterality: N/A;   COLONOSCOPY WITH PROPOFOL N/A 02/09/2022   Procedure: COLONOSCOPY WITH PROPOFOL;  Surgeon: Jonathon Bellows, MD;  Location: [facility] ENDOSCOPY;  Service: Gastroenterology;  Laterality: N/A;   FOOT SURGERY Bilateral    heel spurs   JOINT REPLACEMENT     TOTAL SHOULDER REPLACEMENT Right 01/25/2015   Dr. Roland Rack   TOTAL THYROIDECTOMY  08/01/2018    Family History  Problem Relation Age of Onset   Heart disease Mother    Heart disease Father    Kidney disease Sister    Hypertension Sister    Diabetes Sister    Hypertension Sister    Kidney failure Sister    Hypertension Brother    CVA Brother    Breast cancer Maternal Aunt 39   Ovarian cancer Neg Hx    Colon cancer Neg Hx     Social History   Socioeconomic History   Marital status: Legally Separated    Spouse name: Not on file   Number of children: 1   Years of education: Not on file   Highest  education level: 12th grade  Occupational History   Occupation: Cytogeneticist  Tobacco Use   Smoking status: Never   Smokeless tobacco: Never  Vaping Use   Vaping Use: Never used  Substance and Sexual Activity   Alcohol use: No    Alcohol/week: 0.0 standard drinks of alcohol   Drug use: No   Sexual activity: Not Currently    Birth control/protection: Post-menopausal  Other Topics Concern   Not on file  Social History Narrative   Separated in around 2009.   She was living with her daughter, however currently moved in with her sister and nephew , but is looking for a house.    Social Determinants of Health   Financial Resource Strain: Low Risk  (01/30/2023)   Overall Financial Resource Strain (CARDIA)    Difficulty of Paying Living Expenses: Not hard at all   Food Insecurity: No Food Insecurity (01/30/2023)   Hunger Vital Sign    Worried About Running Out of Food in the Last Year: Never true    Ran Out of Food in the Last Year: Never true  Transportation Needs: No Transportation Needs (01/30/2023)   PRAPARE - Hydrologist (Medical): No    Lack of Transportation (Non-Medical): No  Physical Activity: Insufficiently Active (01/30/2023)   Exercise Vital Sign    Days of Exercise per Week: 1 day    Minutes of Exercise per Session: 10 min  Stress: No Stress Concern Present (01/30/2023)   [place]    Feeling of Stress : Only a little  Recent Concern: Stress - Stress Concern Present (01/14/2023)   [place]    Feeling of Stress : To some extent  Social Connections: Moderately Integrated (01/30/2023)   Social Connection and Isolation Panel [NHANES]    Frequency of Communication with Friends and Family: More than three times a week    Frequency of Social Gatherings with Friends and Family: Twice a week    Attends Religious Services: More than 4 times per year    Active Member of [employer] or Organizations: Yes    Attends Archivist Meetings: More than 4 times per year    Marital Status: Separated  Intimate Partner Violence: Not At Risk (01/30/2023)   Humiliation, Afraid, Rape, and Kick questionnaire    Fear of Current or Ex-Partner: No    Emotionally Abused: No    Physically Abused: No    Sexually Abused: No    Outpatient Encounter Medications as of 01/30/2023  Medication Sig   aspirin 81 MG tablet Take 81 mg by mouth daily.   Calcium Carb-Cholecalciferol (CALCIUM 500+D3 PO) Take 2 tablets by mouth daily.   Cholecalciferol (VITAMIN D3) 1000 units CAPS Take 1,000 Units by mouth daily.   dapagliflozin propanediol (FARXIGA) 10 MG TABS tablet Take 1 tablet (10 mg total) by mouth daily.    diclofenac Sodium (VOLTAREN) 1 % GEL Apply 2 g topically 4 (four) times daily as needed.   exemestane (AROMASIN) 25 MG tablet TAKE 1 TABLET DAILY   ezetimibe (ZETIA) 10 MG tablet Take 1 tablet (10 mg total) by mouth daily.   gabapentin (NEURONTIN) 100 MG capsule Take 2 capsules (200 mg total) by mouth at bedtime as needed.   glucose blood test strip    ketoconazole (NIZORAL) 2 % cream Apply 1 application topically daily.   levothyroxine (SYNTHROID) 125 MCG tablet Take 1 tablet (125 mcg  total) by mouth daily.   Olmesartan-amLODIPine-HCTZ 40-5-25 MG TABS Take 1 tablet by mouth daily at 12 noon.   Semaglutide (RYBELSUS) 14 MG TABS Take 1 tablet (14 mg total) by mouth daily at 6 (six) AM.   Alirocumab (PRALUENT) 150 MG/ML SOAJ Inject 1 mL into the skin every 14 (fourteen) days. (Patient not taking: Reported on 01/30/2023)   [DISCONTINUED] Olmesartan-amLODIPine-HCTZ 40-5-25 MG TABS Take 1 tablet by mouth daily at 12 noon.   No facility-administered encounter medications on file as of 01/30/2023.    Allergies  Allergen Reactions   Pantoprazole Other (See Comments)    Affects thyroid    Rosuvastatin Other (See Comments)    Myalgia     Care Team Updated in EHR: Yes  Last Vision Exam: 2023 Wears corrective lenses: Yes Last Dental Exam: Jan 2024 Last Hearing Exam: N/A Wears Hearing Aids: No  Functional Ability / Safety Screening 1.  Was the timed Get Up and Go test shorter than 30 seconds?  yes 2.  Does the patient need help with the phone, transportation, shopping,      preparing meals, housework, laundry, medications, or managing money?  no 3.  Is the patient's home free of loose throw rugs in walkways, pet beds, electrical cords, etc?   yes      Grab bars in the bathroom? yes      Handrails on the stairs?   yes      Adequate lighting?   yes 4.  Has the patient noticed any hearing difficulties?   no  Diet Recall and Exercise Regimen: she has decreased portion size.   Advanced Care  Planning: A voluntary discussion about advance care planning including the explanation and discussion of advance directives.  Discussed health care proxy and Living will, and the patient was able to identify a health care proxy as Durenda Guthrie.  Patient does not have a living will at present time. If patient does have living will, I have requested they bring this to the clinic to be scanned in to their chart. Does patient have a HCPOA?    no If yes, name and contact information: N/A Does patient have a living will or MOST form?  no  Cancer Screenings: Skin: no atypical lesions  Lung: Low Dose CT Chest recommended if Age 6-80 years, 30 pack-year currently smoking OR have quit w/in 15years. Patient does not qualify. Breast: Up to date on Mammogram? Yes  Up to date of Bone Density/Dexa? I will order bone density today  Colon: 02/09/22  Additional Screenings: Hepatitis B/HIV/Syphillis: 12/24/17 Hepatitis C Screening: 12/24/17 Intimate Partner Violence: Negative  Objective:   Vitals: BP 124/72   Pulse 95   Resp 16   Ht 5\' 4"  (1.626 m)   Wt 225 lb (102.1 kg)   SpO2 99%   BMI 38.62 kg/m  Body mass index is 38.62 kg/m.  No results found.  Physical Exam Constitutional: Patient appears well-developed and well-nourished. Obese  No distress.  HEENT: head atraumatic, normocephalic, pupils equal and reactive to light, ears normal TM, neck supple, throat within normal limits Cardiovascular: Normal rate, regular rhythm and normal heart sounds.  No murmur heard. No BLE edema. Pulmonary/Chest: Effort normal and breath sounds normal. No respiratory distress. Abdominal: Soft.  There is no tenderness. Psychiatric: Patient has a normal mood and affect. behavior is normal. Judgment and thought content normal.  Cognitive Testing - 6-CIT  Correct? Score   What year is it? yes 0 Yes = 0  No = 4  What month is it? yes 0 Yes = 0    No = 3  Remember:     Pia [address], [place], [place]     What  time is it? yes 0 Yes = 0    No = 3  Count backwards from 20 to 1 yes 0 Correct = 0    1 error = 2   More than 1 error = 4  Say the months of the year in reverse. yes 0 Correct = 0    1 error = 2   More than 1 error = 4  What address did I ask you to remember? yes 0 Correct = 0  1 error = 2    2 error = 4    3 error = 6    4 error = 8    All wrong = 10       TOTAL SCORE  0/28   Interpretation:  Normal  Normal (0-7) Abnormal (8-28)   Fall Risk:    01/30/2023    1:33 PM 01/14/2023    8:20 AM 11/26/2022    2:57 PM 07/17/2022    3:25 PM 04/02/2022    2:04 PM  Fall Risk   Falls in the past year? 0 0 0 0 0  Number falls in past yr: 0  0  0  Injury with Fall? 0  0  0  Risk for fall due to : No Fall Risks No Fall Risks No Fall Risks No Fall Risks   Follow up Falls prevention discussed Falls prevention discussed;Education provided;Falls evaluation completed Falls prevention discussed Falls prevention discussed;Falls evaluation completed;Education provided     Depression Screen    01/30/2023    1:33 PM 01/14/2023    8:21 AM 11/26/2022    2:58 PM 07/17/2022    3:26 PM 04/02/2022    2:05 PM  Depression screen PHQ 2/9  Decreased Interest 0 1 0 0 0  Down, Depressed, Hopeless 0 0 0 0 0  PHQ - 2 Score 0 1 0 0 0  Altered sleeping 0 1 0 0 0  Tired, decreased energy 0 1 0 0 3  Change in appetite 0 0 0 0 2  Feeling bad or failure about yourself  0 0 0 0 1  Trouble concentrating 0 0 0 0 0  Moving slowly or fidgety/restless 0 0 0 0 0  Suicidal thoughts 0 0 0 0 0  PHQ-9 Score 0 3 0 0 6  Difficult doing work/chores  Not difficult at all   Not difficult at all     Assessment & Plan:    1. Welcome to Medicare preventive visit  Exercise Activities and Dietary recommendations  She will start going to the [employer] to start water aerobics three times a week on the other days she will walk for at least 20 minutes   - Discussed health benefits of physical activity, and encouraged her to engage in regular  exercise appropriate for her age and condition.   Immunization History  Administered Date(s) Administered   Fluad Quad(high Dose 65+) 11/26/2022   Influenza, Seasonal, Injecte, Preservative Fre 07/09/2011, 11/20/2012   Influenza,inj,Quad PF,6+ Mos 08/02/2014, 08/29/2015, 09/25/2016, 09/29/2018, 10/26/2019, 12/06/2021   Influenza-Unspecified 08/02/2014, 08/27/2020   PNEUMOCOCCAL CONJUGATE-20 11/26/2022   Pneumococcal Conjugate-13 08/29/2015   Pneumococcal Polysaccharide-23 06/13/2010   Tdap 03/07/2010, 09/28/2014, 12/29/2019   Zoster Recombinat (Shingrix) 03/26/2018, 12/29/2018    Health Maintenance  Topic Date Due  OPHTHALMOLOGY EXAM  04/07/2023   HEMOGLOBIN A1C  05/27/2023   Diabetic kidney evaluation - eGFR measurement  11/27/2023   Diabetic kidney evaluation - Urine ACR  11/27/2023   FOOT EXAM  11/27/2023   Medicare Annual Wellness (AWV)  01/30/2024   MAMMOGRAM  01/16/2025   COLONOSCOPY (Pts 45-49yrs Insurance coverage will need to be confirmed)  02/09/2025   PAP SMEAR-Modifier  01/13/2026   DTaP/Tdap/Td (4 - Td or Tdap) 12/28/2029   Pneumonia Vaccine 36+ Years old  Completed   INFLUENZA VACCINE  Completed   DEXA SCAN  Completed   Hepatitis C Screening  Completed   HIV Screening  Completed   Zoster Vaccines- Shingrix  Completed   HPV VACCINES  Aged Out   COVID-19 Vaccine  Discontinued    No orders of the defined types were placed in this encounter.   Current Outpatient Medications:    aspirin 81 MG tablet, Take 81 mg by mouth daily., Disp: , Rfl:    Calcium Carb-Cholecalciferol (CALCIUM 500+D3 PO), Take 2 tablets by mouth daily., Disp: , Rfl:    Cholecalciferol (VITAMIN D3) 1000 units CAPS, Take 1,000 Units by mouth daily., Disp: , Rfl:    dapagliflozin propanediol (FARXIGA) 10 MG TABS tablet, Take 1 tablet (10 mg total) by mouth daily., Disp: 90 tablet, Rfl: 1   diclofenac Sodium (VOLTAREN) 1 % GEL, Apply 2 g topically 4 (four) times daily as needed., Disp: 100 g,  Rfl: 2   exemestane (AROMASIN) 25 MG tablet, TAKE 1 TABLET DAILY, Disp: 90 tablet, Rfl: 3   ezetimibe (ZETIA) 10 MG tablet, Take 1 tablet (10 mg total) by mouth daily., Disp: 90 tablet, Rfl: 1   gabapentin (NEURONTIN) 100 MG capsule, Take 2 capsules (200 mg total) by mouth at bedtime as needed., Disp: 180 capsule, Rfl: 1   glucose blood test strip, , Disp: , Rfl:    ketoconazole (NIZORAL) 2 % cream, Apply 1 application topically daily., Disp: 60 g, Rfl: 0   levothyroxine (SYNTHROID) 125 MCG tablet, Take 1 tablet (125 mcg total) by mouth daily., Disp: 90 tablet, Rfl: 0   Olmesartan-amLODIPine-HCTZ 40-5-25 MG TABS, Take 1 tablet by mouth daily at 12 noon., Disp: 90 tablet, Rfl: 1   Semaglutide (RYBELSUS) 14 MG TABS, Take 1 tablet (14 mg total) by mouth daily at 6 (six) AM., Disp: 90 tablet, Rfl: 1   Alirocumab (PRALUENT) 150 MG/ML SOAJ, Inject 1 mL into the skin every 14 (fourteen) days. (Patient not taking: Reported on 01/30/2023), Disp: 6 mL, Rfl: 1 There are no discontinued medications.  I have personally reviewed and addressed the Medicare Annual Wellness health risk assessment questionnaire and have noted the following in the patient's chart:  A.         Medical and social history & family history B.         Use of alcohol, tobacco, and illicit drugs  C.         Current medications and supplements D.         Functional and Cognitive ability and status E.         Nutritional status F.         Physical activity G.        Advance directives H.         List of other physicians I.          Hospitalizations, surgeries, and ER visits in previous 12 months J.         Vitals K.  Screenings such as hearing, vision, cognitive function, and depression L.         Referrals and appointments: bone density   In addition, I have reviewed and discussed with patient certain preventive protocols, quality metrics, and best practice recommendations. A written personalized care plan for preventive  services as well as general preventive health recommendations were provided to patient.   See attached scanned questionnaire for additional information.   Return for Follow up medicare wellness in one year .

## 2023-01-29 NOTE — Patient Instructions (Signed)
Preventive Care 65 Years and Older, Female Preventive care refers to lifestyle choices and visits with your health care provider that can promote health and wellness. Preventive care visits are also called wellness exams. What can I expect for my preventive care visit? Counseling Your health care provider may ask you questions about your: Medical history, including: Past medical problems. Family medical history. Pregnancy and menstrual history. History of falls. Current health, including: Memory and ability to understand (cognition). Emotional well-being. Home life and relationship well-being. Sexual activity and sexual health. Lifestyle, including: Alcohol, nicotine or tobacco, and drug use. Access to firearms. Diet, exercise, and sleep habits. Work and work environment. Sunscreen use. Safety issues such as seatbelt and bike helmet use. Physical exam Your health care provider will check your: Height and weight. These may be used to calculate your BMI (body mass index). BMI is a measurement that tells if you are at a healthy weight. Waist circumference. This measures the distance around your waistline. This measurement also tells if you are at a healthy weight and may help predict your risk of certain diseases, such as type 2 diabetes and high blood pressure. Heart rate and blood pressure. Body temperature. Skin for abnormal spots. What immunizations do I need?  Vaccines are usually given at various ages, according to a schedule. Your health care provider will recommend vaccines for you based on your age, medical history, and lifestyle or other factors, such as travel or where you work. What tests do I need? Screening Your health care provider may recommend screening tests for certain conditions. This may include: Lipid and cholesterol levels. Hepatitis C test. Hepatitis B test. HIV (human immunodeficiency virus) test. STI (sexually transmitted infection) testing, if you are at  risk. Lung cancer screening. Colorectal cancer screening. Diabetes screening. This is done by checking your blood sugar (glucose) after you have not eaten for a while (fasting). Mammogram. Talk with your health care provider about how often you should have regular mammograms. BRCA-related cancer screening. This may be done if you have a family history of breast, ovarian, tubal, or peritoneal cancers. Bone density scan. This is done to screen for osteoporosis. Talk with your health care provider about your test results, treatment options, and if necessary, the need for more tests. Follow these instructions at home: Eating and drinking  Eat a diet that includes fresh fruits and vegetables, whole grains, lean protein, and low-fat dairy products. Limit your intake of foods with high amounts of sugar, saturated fats, and salt. Take vitamin and mineral supplements as recommended by your health care provider. Do not drink alcohol if your health care provider tells you not to drink. If you drink alcohol: Limit how much you have to 0-1 drink a day. Know how much alcohol is in your drink. In the [place] one drink equals one 12 oz bottle of beer (355 mL), one 5 oz glass of wine (148 mL), or one 1 oz glass of hard liquor (44 mL). Lifestyle Brush your teeth every morning and night with fluoride toothpaste. Floss one time each day. Exercise for at least 30 minutes 5 or more days each week. Do not use any products that contain nicotine or tobacco. These products include cigarettes, chewing tobacco, and vaping devices, such as e-cigarettes. If you need help quitting, ask your health care provider. Do not use drugs. If you are sexually active, practice safe sex. Use a condom or other form of protection in order to prevent STIs. Take aspirin only as told by   your health care provider. Make sure that you understand how much to take and what form to take. Work with your health care provider to find out whether it  is safe and beneficial for you to take aspirin daily. Ask your health care provider if you need to take a cholesterol-lowering medicine (statin). Find healthy ways to manage stress, such as: Meditation, yoga, or listening to music. Journaling. Talking to a trusted person. Spending time with friends and family. Minimize exposure to UV radiation to reduce your risk of skin cancer. Safety Always wear your seat belt while driving or riding in a vehicle. Do not drive: If you have been drinking alcohol. Do not ride with someone who has been drinking. When you are tired or distracted. While texting. If you have been using any mind-altering substances or drugs. Wear a helmet and other protective equipment during sports activities. If you have firearms in your house, make sure you follow all gun safety procedures. What's next? Visit your health care provider once a year for an annual wellness visit. Ask your health care provider how often you should have your eyes and teeth checked. Stay up to date on all vaccines. This information is not intended to replace advice given to you by your health care provider. Make sure you discuss any questions you have with your health care provider. Document Revised: 04/26/2021 Document Reviewed: 04/26/2021 Elsevier Patient Education  2023 Elsevier Inc[employer]   

## 2023-01-30 ENCOUNTER — Ambulatory Visit (INDEPENDENT_AMBULATORY_CARE_PROVIDER_SITE_OTHER): Payer: Medicare HMO | Admitting: Family Medicine

## 2023-01-30 ENCOUNTER — Other Ambulatory Visit: Payer: Self-pay

## 2023-01-30 ENCOUNTER — Telehealth: Payer: Self-pay | Admitting: Family Medicine

## 2023-01-30 ENCOUNTER — Other Ambulatory Visit: Payer: Self-pay | Admitting: Family Medicine

## 2023-01-30 ENCOUNTER — Encounter: Payer: Self-pay | Admitting: Family Medicine

## 2023-01-30 VITALS — BP 124/72 | HR 95 | Resp 16 | Ht 64.0 in | Wt 225.0 lb

## 2023-01-30 DIAGNOSIS — I119 Hypertensive heart disease without heart failure: Secondary | ICD-10-CM

## 2023-01-30 DIAGNOSIS — Z0000 Encounter for general adult medical examination without abnormal findings: Secondary | ICD-10-CM | POA: Diagnosis not present

## 2023-01-30 DIAGNOSIS — G720 Drug-induced myopathy: Secondary | ICD-10-CM

## 2023-01-30 DIAGNOSIS — E785 Hyperlipidemia, unspecified: Secondary | ICD-10-CM

## 2023-01-30 MED ORDER — OLMESARTAN-AMLODIPINE-HCTZ 40-5-25 MG PO TABS: 1.0000 | ORAL_TABLET | Freq: Every day | ORAL | 1 refills | Status: DC

## 2023-01-30 NOTE — Telephone Encounter (Signed)
Medication Refill - Medication: Semaglutide (RYBELSUS) 14 MG TABS [id number]  Alirocumab (PRALUENT) 150 MG/ML SOAJ [id number]  ezetimibe (ZETIA) 10 MG tablet [id number]  Pt had [facility] Jan but has now changed to [facility] and they would like new scripts sent to them / please advise   Has the patient contacted their pharmacy? Yes.   (Agent: If no, request that the patient contact the pharmacy for the refill. If patient does not wish to contact the pharmacy document the reason why and proceed with request.) (Agent: If yes, when and what did the pharmacy advise?)  Preferred Pharmacy (with phone number or street name): [facility]  Has the patient been seen for an appointment in the last year OR does the patient have an upcoming appointment? Yes.    Agent: Please be advised that RX refills may take up to 3 business days. We ask that you follow-up with your pharmacy.

## 2023-01-30 NOTE — Telephone Encounter (Signed)
Patient stated she was not ok with taking 3 separate pills and she would like this one sent to [facility] instead.

## 2023-01-30 NOTE — Telephone Encounter (Signed)
Pharmacy called and the Olmesartan-amLODIPine-HCTZ 40-5-25 MG TABS are out of stock / they can fill this as separate medications but provider would need to send new scripts /please advise

## 2023-01-31 ENCOUNTER — Encounter: Payer: Self-pay | Admitting: Family Medicine

## 2023-01-31 MED ORDER — EZETIMIBE 10 MG PO TABS: 10.0000 mg | ORAL_TABLET | Freq: Every day | ORAL | 0 refills | Status: DC

## 2023-01-31 MED ORDER — RYBELSUS 14 MG PO TABS: 1.0000 | ORAL_TABLET | Freq: Every day | ORAL | 0 refills | Status: DC

## 2023-01-31 NOTE — Telephone Encounter (Signed)
Change in pharmacy- reminder of Rx forwarded to mail order pharmacy Requested Prescriptions  Pending Prescriptions Disp Refills   Alirocumab (PRALUENT) 150 MG/ML SOAJ 6 mL 1    Sig: Inject 1 mL into the skin every 14 (fourteen) days.     Cardiovascular: PCSK9 Inhibitors Passed - 01/31/2023  7:34 AM      Passed - Valid encounter within last 12 months    Recent Outpatient Visits           Yesterday Welcome to Medicare preventive visit   [facility] Steele Sizer, MD   2 weeks ago Well adult exam   [facility] Steele Sizer, MD   2 months ago Dyslipidemia due to type 2 diabetes mellitus [facility])   [facility] Steele Sizer, MD   6 months ago Dyslipidemia due to type 2 diabetes mellitus [facility])   [facility] Steele Sizer, MD   10 months ago Chest pain, unspecified type   [facility] Bo Merino, FNP       Future Appointments             In 1 month Steele Sizer, MD [facility], [place]   In 11 months Steele Sizer, MD [facility], [place] Panel completed within the last 12 months    Cholesterol, Total  Date Value Ref Range Status  12/21/2015 174 100 - 199 mg/dL Final   Cholesterol  Date Value Ref Range Status  11/26/2022 181 <200 mg/dL Final   LDL Cholesterol (Calc)  Date Value Ref Range Status  11/26/2022 112 (H) mg/dL (calc) Final    Comment:    Reference range: <100 . Desirable range <100 mg/dL for primary prevention;   <70 mg/dL for patients with CHD or diabetic patients  with > or = 2 CHD risk factors. Marland Kitchen LDL-C is now calculated using the Martin-Hopkins  calculation, which is a validated novel method providing  better accuracy than the Friedewald equation in the  estimation of LDL-C.  Cresenciano Genre et al. Annamaria Helling. 9798;921(19): 2061-2068   (http://education.[facility].com/faq/FAQ164)    HDL  Date Value Ref Range Status  11/26/2022 50 > OR = 50 mg/dL Final  12/21/2015 60 >39 mg/dL Final   Triglycerides  Date Value Ref Range Status  11/26/2022 91 <150 mg/dL Final          Semaglutide (RYBELSUS) 14 MG TABS 90 tablet 1    Sig: Take 1 tablet (14 mg total) by mouth daily at 6 (six) AM.     Off-Protocol Failed - 01/31/2023  7:34 AM      Failed - Medication not assigned to a protocol, review manually.      Passed - Valid encounter within last 12 months    Recent Outpatient Visits           Yesterday Welcome to Medicare preventive visit   [facility]izer, MD   2 weeks ago Well adult exam   [facility] Steele Sizer, MD   2 months ago Dyslipidemia due to type 2 diabetes mellitus [facility])   [facility] Steele Sizer, MD   6 months ago Dyslipidemia due to type 2 diabetes mellitus [facility])   [facility] Steele Sizer, MD   10 months ago Chest pain, unspecified type  [place], FNP       Future Appointments             In 1 month Steele Sizer, MD [facility], [place]   In 11 months Steele Sizer, MD [facility], [facility]             ezetimibe (ZETIA) 10 MG tablet 90 tablet 1    Sig: Take 1 tablet (10 mg total) by mouth daily.     Cardiovascular:  Antilipid - Sterol Transport Inhibitors Failed - 01/31/2023  7:34 AM      Failed - Lipid Panel in normal range within the last 12 months    Cholesterol, Total  Date Value Ref Range Status  12/21/2015 174 100 - 199 mg/dL Final   Cholesterol  Date Value Ref Range Status  11/26/2022 181 <200 mg/dL Final   LDL Cholesterol (Calc)  Date Value Ref Range Status  11/26/2022 112 (H) mg/dL (calc) Final    Comment:    Reference range:  <100 . Desirable range <100 mg/dL for primary prevention;   <70 mg/dL for patients with CHD or diabetic patients  with > or = 2 CHD risk factors. Marland Kitchen LDL-C is now calculated using the Martin-Hopkins  calculation, which is a validated novel method providing  better accuracy than the Friedewald equation in the  estimation of LDL-C.  Cresenciano Genre et al. Annamaria Helling. [id number]): 2061-2068  (http://education.[facility].com/faq/FAQ164)    HDL  Date Value Ref Range Status  11/26/2022 50 > OR = 50 mg/dL Final  12/21/2015 60 >39 mg/dL Final   Triglycerides  Date Value Ref Range Status  11/26/2022 91 <150 mg/dL Final         Passed - AST in normal range and within 360 days    AST  Date Value Ref Range Status  11/26/2022 15 10 - 35 U/L Final         Passed - ALT in normal range and within 360 days    ALT  Date Value Ref Range Status  11/26/2022 25 6 - 29 U/L Final         Passed - Patient is not pregnant      Passed - Valid encounter within last 12 months    Recent Outpatient Visits           Yesterday Welcome to [employer] preventive visit   [facility] Steele Sizer, MD   2 weeks ago Well adult exam   [facility]izer, MD   2 months ago Dyslipidemia due to type 2 diabetes mellitus [facility])   [facility] Steele Sizer, MD   6 months ago Dyslipidemia due to type 2 diabetes mellitus [facility])   [facility] Steele Sizer, MD   10 months ago Chest pain, unspecified type   [place], FNP       Future Appointments             In 1 month Steele Sizer, MD [facility], [place]   In 11 months Steele Sizer, MD [facility]

## 2023-02-01 ENCOUNTER — Telehealth: Payer: Self-pay | Admitting: Family Medicine

## 2023-02-01 NOTE — Telephone Encounter (Signed)
Left pharmacy vm °

## 2023-02-01 NOTE — Telephone Encounter (Signed)
Andrea Randolph with [name] is calling because they received a script for Alirocumab (Morenci) 150 MG/ML SOAJ [id number] as well as one for 70 mg and she wanted to know which one is the correct dose. Please advise.

## 2023-02-12 ENCOUNTER — Other Ambulatory Visit: Payer: Self-pay | Admitting: Family Medicine

## 2023-02-12 DIAGNOSIS — E1169 Type 2 diabetes mellitus with other specified complication: Secondary | ICD-10-CM

## 2023-02-12 DIAGNOSIS — T466X5A Adverse effect of antihyperlipidemic and antiarteriosclerotic drugs, initial encounter: Secondary | ICD-10-CM

## 2023-02-12 DIAGNOSIS — E785 Hyperlipidemia, unspecified: Secondary | ICD-10-CM

## 2023-02-19 ENCOUNTER — Other Ambulatory Visit: Payer: Self-pay | Admitting: Family Medicine

## 2023-02-19 DIAGNOSIS — E785 Hyperlipidemia, unspecified: Secondary | ICD-10-CM

## 2023-02-19 DIAGNOSIS — E1169 Type 2 diabetes mellitus with other specified complication: Secondary | ICD-10-CM

## 2023-02-19 DIAGNOSIS — G720 Drug-induced myopathy: Secondary | ICD-10-CM

## 2023-03-02 ENCOUNTER — Other Ambulatory Visit: Payer: Self-pay | Admitting: Family Medicine

## 2023-03-02 DIAGNOSIS — E890 Postprocedural hypothyroidism: Secondary | ICD-10-CM

## 2023-03-11 ENCOUNTER — Inpatient Hospital Stay: Payer: Medicare HMO | Admitting: Oncology

## 2023-03-11 ENCOUNTER — Inpatient Hospital Stay: Payer: Medicare HMO

## 2023-03-11 ENCOUNTER — Encounter: Payer: Self-pay | Admitting: Oncology

## 2023-03-11 ENCOUNTER — Inpatient Hospital Stay (HOSPITAL_BASED_OUTPATIENT_CLINIC_OR_DEPARTMENT_OTHER): Payer: Medicare HMO | Admitting: Oncology

## 2023-03-11 ENCOUNTER — Inpatient Hospital Stay: Payer: Medicare HMO | Attending: Oncology

## 2023-03-11 VITALS — BP 132/81 | HR 80 | Temp 96.4°F | Resp 18 | Wt 223.9 lb

## 2023-03-11 DIAGNOSIS — N1832 Chronic kidney disease, stage 3b: Secondary | ICD-10-CM

## 2023-03-11 DIAGNOSIS — Z79811 Long term (current) use of aromatase inhibitors: Secondary | ICD-10-CM | POA: Insufficient documentation

## 2023-03-11 DIAGNOSIS — Z170 Estrogen receptor positive status [ER+]: Secondary | ICD-10-CM | POA: Insufficient documentation

## 2023-03-11 DIAGNOSIS — Z7982 Long term (current) use of aspirin: Secondary | ICD-10-CM | POA: Diagnosis not present

## 2023-03-11 DIAGNOSIS — Z79899 Other long term (current) drug therapy: Secondary | ICD-10-CM | POA: Insufficient documentation

## 2023-03-11 DIAGNOSIS — Z923 Personal history of irradiation: Secondary | ICD-10-CM | POA: Insufficient documentation

## 2023-03-11 DIAGNOSIS — C50211 Malignant neoplasm of upper-inner quadrant of right female breast: Secondary | ICD-10-CM | POA: Diagnosis not present

## 2023-03-11 DIAGNOSIS — Z803 Family history of malignant neoplasm of breast: Secondary | ICD-10-CM | POA: Insufficient documentation

## 2023-03-11 LAB — COMPREHENSIVE METABOLIC PANEL
ALT: 17 U/L (ref 0–44)
AST: 20 U/L (ref 15–41)
Albumin: 3.7 g/dL (ref 3.5–5.0)
Alkaline Phosphatase: 44 U/L (ref 38–126)
Anion gap: 8 (ref 5–15)
BUN: 15 mg/dL (ref 8–23)
CO2: 25 mmol/L (ref 22–32)
Calcium: 8.9 mg/dL (ref 8.9–10.3)
Chloride: 102 mmol/L (ref 98–111)
Creatinine, Ser: 1.34 mg/dL — ABNORMAL HIGH (ref 0.44–1.00)
GFR, Estimated: 44 mL/min — ABNORMAL LOW (ref >60)
Glucose, Bld: 129 mg/dL — ABNORMAL HIGH (ref 70–99)
Potassium: 3.4 mmol/L — ABNORMAL LOW (ref 3.5–5.1)
Sodium: 135 mmol/L (ref 135–145)
Total Bilirubin: 0.4 mg/dL (ref 0.3–1.2)
Total Protein: 7.1 g/dL (ref 6.5–8.1)

## 2023-03-11 LAB — CBC WITH DIFFERENTIAL/PLATELET
Abs Immature Granulocytes: 0.01 10*3/uL (ref 0.00–0.07)
Basophils Absolute: 0 10*3/uL (ref 0.0–0.1)
Basophils Relative: 1 %
Eosinophils Absolute: 0.2 10*3/uL (ref 0.0–0.5)
Eosinophils Relative: 5 %
HCT: 38.6 % (ref 36.0–46.0)
Hemoglobin: 12.8 g/dL (ref 12.0–15.0)
Immature Granulocytes: 0 %
Lymphocytes Relative: 37 %
Lymphs Abs: 1.3 10*3/uL (ref 0.7–4.0)
MCH: 27.6 pg (ref 26.0–34.0)
MCHC: 33.2 g/dL (ref 30.0–36.0)
MCV: 83.2 fL (ref 80.0–100.0)
Monocytes Absolute: 0.3 10*3/uL (ref 0.1–1.0)
Monocytes Relative: 8 %
Neutro Abs: 1.8 10*3/uL (ref 1.7–7.7)
Neutrophils Relative %: 49 %
Platelets: 332 10*3/uL (ref 150–400)
RBC: 4.64 MIL/uL (ref 3.87–5.11)
RDW: 13.7 % (ref 11.5–15.5)
WBC: 3.6 10*3/uL — ABNORMAL LOW (ref 4.0–10.5)
nRBC: 0 % (ref 0.0–0.2)

## 2023-03-11 MED ORDER — EXEMESTANE 25 MG PO TABS: 25.0000 mg | ORAL_TABLET | Freq: Every day | ORAL | 3 refills | Status: DC

## 2023-03-11 NOTE — Assessment & Plan Note (Addendum)
Right breast invasive carcinoma.  pT1a pN0, ER positive, PR positive, HER2 negative. Focal DCIS margin positive.Patient declined reexcision.s/p adjuvant radiation.  Unable to tolerate Arimidex  switched to Aromasin. Labs reviewed and discussed with patient Continue Aromasin 25mg  daily.  Annual mammogram, March 2024, results were reviewed.  Reassurance was provided to her occasional right breast pain which is a common phenomenon after previous lumpectomy.

## 2023-03-11 NOTE — Assessment & Plan Note (Signed)
Recommend calcium 1200 mg daily.  She has a normal baseline bone density. Repeat DEXA in July 2024  

## 2023-03-11 NOTE — Assessment & Plan Note (Signed)
Encourage oral hydration and avoid nephrotoxins.   

## 2023-03-11 NOTE — Progress Notes (Signed)
Hematology/Oncology Progress note Telephone:([phone] Fax:(336) [phone]      ASSESSMENT & PLAN:   Cancer Staging  Malignant 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, estrogen receptor positive (HCC) Staging form: Breast, AJCC 8th Edition - Pathologic stage from 03/20/2021: Stage IA (pT1a, pN0, cM0, G2, ER+, PR+, HER2-) - Signed by Andrea Patience, MD on 03/20/2021   Malignant 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, estrogen receptor positive (HCC) Right breast invasive carcinoma.  pT1a pN0, ER positive, PR positive, HER2 negative. Focal DCIS margin positive.Patient declined reexcision.s/p adjuvant radiation.  Unable to tolerate Arimidex  switched to Aromasin. Labs reviewed and discussed with patient Continue Aromasin 25mg  daily.  Annual mammogram, March 2024, results were reviewed.  Reassurance was provided to her occasional right breast pain which is a common phenomenon after previous lumpectomy.    Aromatase inhibitor use Recommend calcium 1200 mg daily.  She has a normal baseline bone density. Repeat DEXA in July 2024   Stage 3b chronic kidney disease (HCC) Encourage oral hydration and avoid nephrotoxins.     Orders Placed This Encounter  Procedures   DG Bone Density    Standing Status:   Future    Standing Expiration Date:   03/10/2024    Order Specific Question:   Reason for Exam (SYMPTOM  OR DIAGNOSIS REQUIRED)    Answer:   Breast Cancer    Order Specific Question:   Preferred imaging location?    Answer:   [facility]   CBC with Differential (Cancer Center Only)    Standing Status:   Future    Standing Expiration Date:   03/10/2024   CMP (Cancer Center only)    Standing Status:   Future    Standing Expiration Date:   03/10/2024   Recommend 6 months.  All questions were answered. The patient knows to call the clinic with any problems, questions or concerns.  Andrea Patience, MD, PhD [facility] Hematology Oncology 03/11/2023     CHIEF COMPLAINTS/REASON FOR VISIT:  Follow up of breast cancer.   HISTORY OF PRESENTING ILLNESS:   Andrea Randolph is a  67 y.o.  female with PMH listed below was seen in consultation at the request of  Andrea Cory, MD  for evaluation of breast cancer.   01/12/21 screening mammogram showed focal asymmetry with possible distortion.  01/26/21 right diagnostic mammogram and US showed 3x5x43mm hypoechoic mass, s/p biopsy on 02/07/21 Pathology showed invasive mammary carcinoma, grade 2, ER90% PR 90%, HER2 negative.   Patient was seen by Andrea Randolph yesterday. She presents to establish care with oncology Accompanied by her daughter  Menarche 7 years with Age at first childbirth 41.  OCP use for about 20 years Denies any estrogen replacement. LMP in 23s. Denies any previous chest radiation. History of left breast biopsy previously. Family history Father for maternal aunt who was diagnosed of breast cancer at age of 63.  03/06/2021 lumpectomy and sentinel lymph node biopsy. Pathology showed invasive mammary carcinoma, no special type, DCIS in situ, intermediate grade.  All 3 sentinel lymph nodes were negative.  1 nonsentinel lymph node was negative.  Margins for invasive carcinoma was negative.  Margins for DCIS was unifocal positive. Patient declined reexcision.Patient's case was discussed on breast cancer tumor board on 03/20/2021.  # 05/03/2021 finished adjuvant radiation.  05/09/2021, patient started on Arimidex.  Patient called to report that she has experienced significant leg pain, back pain, neck pain shortly after starting Arimidex.  left knee pain and  swelling.  06/20/2021, left knee x-ray showed diffuse joint space narrowing of the left knee with spurring at the lateral compartment.  Small joint effusion.  07/20/2021 patient was seen by orthopedic surgeon for left knee arthritis.  Patient received intra-articular corticosteroid injection and left knee pain has improved.  01/15/2022   bilateral diagnostic mammogram results were reviewed and discussed with patient.  Continue annual mammogram. Bone health, 05/16/2021, normal bone density.  INTERVAL HISTORY Andrea Randolph is a 67 y.o. female who has above history reviewed by me today presents for follow up visit for management of breast cancer + Chronic Arthralgia and hot flash, manageable.  she takes Aromasin daily. Otherwise no new complaints.   Review of Systems  Constitutional:  Negative for appetite change, chills, fatigue and fever.  HENT:   Negative for hearing loss and voice change.   Eyes:  Negative for eye problems.  Respiratory:  Negative for chest tightness and cough.   Cardiovascular:  Negative for chest pain.  Gastrointestinal:  Negative for abdominal distention, abdominal pain and blood in stool.  Endocrine: Negative for hot flashes.  Genitourinary:  Negative for difficulty urinating and frequency.   Musculoskeletal:  Positive for arthralgias.  Skin:  Negative for itching and rash.  Neurological:  Negative for extremity weakness.  Hematological:  Negative for adenopathy.  Psychiatric/Behavioral:  Negative for confusion. The patient is not nervous/anxious.     MEDICAL HISTORY:  Past Medical History:  Diagnosis Date   Allergy    Anemia    Arthritis of right shoulder region    Dr. Katrinka Randolph   Cancer [facility])    breast cancer right side   Chronic insomnia    Diabetes mellitus without complication (HCC)    Family history of adverse reaction to anesthesia    sister with Nausea   GERD (gastroesophageal reflux disease)    Goiter    Dr. Tedd Randolph   Heart murmur    Hyperlipidemia    Hypertension    Hypertensive retinopathy of left eye    Hypothyroidism, adult    Dr. Tedd Randolph   Inflammation of urethra    Menopause    Microalbuminuria    DM   Mixed incontinence    Muscle cramps    Obesity    OSA (obstructive sleep apnea)    Personal history of radiation therapy    Breast Cancer Right breast   Radiculitis,  lumbosacral    Vitamin D deficiency     SURGICAL HISTORY: Past Surgical History:  Procedure Laterality Date   BREAST BIOPSY Left 90's   benign core needle    BREAST BIOPSY Right 02/07/2021   [place] Bx, Q-clip, DCIS   BREAST LUMPECTOMY Right 03/06/2021   BREAST LUMPECTOMY WITH SENTINEL LYMPH NODE BIOPSY Right 03/06/2021   Procedure: BREAST LUMPECTOMY WITH SENTINEL LYMPH NODE BX;  Surgeon: Earline Mayotte, MD;  Location: [facility] ORS;  Service: General;  Laterality: Right;   COLONOSCOPY WITH PROPOFOL N/A 01/26/2019   Procedure: COLONOSCOPY WITH PROPOFOL;  Surgeon: Toney Reil, MD;  Location: [facility] ENDOSCOPY;  Service: Gastroenterology;  Laterality: N/A;   COLONOSCOPY WITH PROPOFOL N/A 02/09/2022   Procedure: COLONOSCOPY WITH PROPOFOL;  Surgeon: Wyline Mood, MD;  Location[facility] END[facility]COPY;  Service: Gastroenterology;  Laterality: N/A;   FOOT SURGERY Bilateral    heel spurs   JOINT REPLACEMENT     TOTAL SHOULDER REPLACEMENT Right 01/25/2015   Dr. Joice Lofts   TOTAL THYROIDECTOMY  08/01/2018    SOCIAL HISTORY: Social History   Socioeconomic History  Marital status: Legally Separated    Spouse name: Not on file   Number of children: 1   Years of education: Not on file   Highest education level: 12th grade  Occupational History   Occupation: Biomedical scientist  Tobacco Use   Smoking status: Never   Smokeless tobacco: Never  Vaping Use   Vaping Use: Never used  Substance and Sexual Activity   Alcohol use: No    Alcohol/week: 0.0 standard drinks of alcohol   Drug use: No   Sexual activity: Not Currently    Birth control/protection: Post-menopausal  Other Topics Concern   Not on file  Social History Narrative   Separated in around 2009.   She was living with her daughter, however currently moved in with her sister and nephew , but is looking for a house.    Social Determinants of Health   Financial Resource Strain: Low Risk  (01/30/2023)   Overall Financial Resource  Strain (CARDIA)    Difficulty of Paying Living Expenses: Not hard at all  Food Insecurity: No Food Insecurity (01/30/2023)   Hunger Vital Sign    Worried About Running Out of Food in the Last Year: Never true    Ran Out of Food in the Last Year: Never true  Transportation Needs: No Transportation Needs (01/30/2023)   PRAPARE - Administrator, Civil Service (Medical): No    Lack of Transportation (Non-Medical): No  Physical Activity: Insufficiently Active (01/30/2023)   Exercise Vital Sign    Days of Exercise per Week: 1 day    Minutes of Exercise per Session: 10 min  Stress: No Stress Concern Present (01/30/2023)   [employer] of Occupational Health - Occupational Stress Questionnaire    Feeling of Stress : Only a little  Recent Concern: Stress - Stress Concern Present (01/14/2023)   [employer] of Occupational Health - Occupational Stress Questionnaire    Feeling of Stress : To some extent  Social Connections: Moderately Integrated (01/30/2023)   Social Connection and Isolation Panel [NHANES]    Frequency of Communication with Friends and Family: More than three times a week    Frequency of Social Gatherings with Friends and Family: Twice a week    Attends Religious Services: More than 4 times per year    Active Member of [employer] or Organizations: Yes    Attends Banker Meetings: More than 4 times per year    Marital Status: Separated  Intimate Partner Violence: Not At Risk (01/30/2023)   Humiliation, Afraid, Rape, and Kick questionnaire    Fear of Current or Ex-Partner: No    Emotionally Abused: No    Physically Abused: No    Sexually Abused: No    FAMILY HISTORY: Family History  Problem Relation Age of Onset   Heart disease Mother    Heart disease Father    Kidney disease Sister    Hypertension Sister    Diabetes Sister    Hypertension Sister    Kidney failure Sister    Hypertension Brother    CVA Brother    Breast cancer Maternal Aunt 50    Ovarian cancer Neg Hx    Colon cancer Neg Hx     ALLERGIES:  is allergic to pantoprazole and rosuvastatin.  MEDICATIONS:  Current Outpatient Medications  Medication Sig Dispense Refill   aspirin 81 MG tablet Take 81 mg by mouth daily.     Calcium Carb-Cholecalciferol (CALCIUM 500+D3 PO) Take 2 tablets by mouth daily.  Cholecalciferol (VITAMIN D3) 1000 units CAPS Take 1,000 Units by mouth daily.     dapagliflozin propanediol (FARXIGA) 10 MG TABS tablet Take 1 tablet (10 mg total) by mouth daily. 90 tablet 1   diclofenac Sodium (VOLTAREN) 1 % GEL Apply 2 g topically 4 (four) times daily as needed. 100 g 2   ezetimibe (ZETIA) 10 MG tablet Take 1 tablet (10 mg total) by mouth daily. 90 tablet 0   gabapentin (NEURONTIN) 100 MG capsule Take 2 capsules (200 mg total) by mouth at bedtime as needed. 180 capsule 1   glucose blood test strip      ketoconazole (NIZORAL) 2 % cream Apply 1 application topically daily. 60 g 0   levothyroxine (SYNTHROID) 125 MCG tablet Take 1 tablet (125 mcg total) by mouth daily. 90 tablet 0   Olmesartan-amLODIPine-HCTZ 40-5-25 MG TABS Take 1 tablet by mouth daily at 12 noon. 90 tablet 1   Semaglutide (RYBELSUS) 14 MG TABS Take 1 tablet (14 mg total) by mouth daily at 6 (six) AM. 90 tablet 0   Alirocumab (PRALUENT) 150 MG/ML SOAJ Inject 1 mL into the skin every 14 (fourteen) days. (Patient not taking: Reported on 01/30/2023) 6 mL 1   exemestane (AROMASIN) 25 MG tablet Take 1 tablet (25 mg total) by mouth daily. 90 tablet 3   No current facility-administered medications for this visit.     PHYSICAL EXAMINATION: ECOG PERFORMANCE STATUS: 0 - Asymptomatic Vitals:   03/11/23 0832  BP: 132/81  Pulse: 80  Resp: 18  Temp: (!) 96.4 F (35.8 C)  SpO2: 100%   Filed Weights   03/11/23 0832  Weight: 223 lb 14.4 oz (101.6 kg)    Physical Exam Constitutional:      General: She is not in acute distress. HENT:     Head: Normocephalic and atraumatic.  Eyes:      General: No scleral icterus. Cardiovascular:     Rate and Rhythm: Normal rate and regular rhythm.     Heart sounds: Murmur heard.  Pulmonary:     Effort: Pulmonary effort is normal. No respiratory distress.     Breath sounds: No wheezing.  Abdominal:     General: Bowel sounds are normal. There is no distension.     Palpations: Abdomen is soft.  Musculoskeletal:        General: No deformity. Normal range of motion.     Cervical back: Normal range of motion and neck supple.  Skin:    General: Skin is warm and dry.     Findings: No erythema or rash.  Neurological:     Mental Status: She is alert and oriented to person, place, and time. Mental status is at baseline.     Cranial Nerves: No cranial nerve deficit.     Coordination: Coordination normal.  Psychiatric:        Mood and Affect: Mood normal.     LABORATORY DATA:  I have reviewed the data as listed    Latest Ref Rng & Units 03/11/2023    8:17 AM 09/06/2022   10:46 AM 04/02/2022    2:50 PM  CBC  WBC 4.0 - 10.5 K/uL 3.6  3.4  9.2   Hemoglobin 12.0 - 15.0 g/dL 16.1  09.6  04.5   Hematocrit 36.0 - 46.0 % 38.6  43.3  45.9   Platelets 150 - 400 K/uL 332  362  372       Latest Ref Rng & Units 03/11/2023    8:17 AM 11/26/2022  3:34 PM 09/06/2022   10:46 AM  CMP  Glucose 70 - 99 mg/dL 161  89  096   BUN 8 - 23 mg/dL 15  15  18    Creatinine 0.44 - 1.00 mg/dL 0.45  4.09  8.11   Sodium 135 - 145 mmol/L 135  144  139   Potassium 3.5 - 5.1 mmol/L 3.4  3.6  3.2   Chloride 98 - 111 mmol/L 102  107  103   CO2 22 - 32 mmol/L 25  28  28    Calcium 8.9 - 10.3 mg/dL 8.9  9.7  9.1   Total Protein 6.5 - 8.1 g/dL 7.1  6.8  7.8   Total Bilirubin 0.3 - 1.2 mg/dL 0.4  0.6  0.6   Alkaline Phos 38 - 126 U/L 44   49   AST 15 - 41 U/L 20  15  18    ALT 0 - 44 U/L 17  25  19       RADIOGRAPHIC STUDIES: I have personally reviewed the radiological images as listed and agreed with the findings in the report. No results found.

## 2023-03-21 ENCOUNTER — Other Ambulatory Visit: Payer: Self-pay | Admitting: Family Medicine

## 2023-03-21 DIAGNOSIS — E1140 Type 2 diabetes mellitus with diabetic neuropathy, unspecified: Secondary | ICD-10-CM

## 2023-03-21 DIAGNOSIS — E890 Postprocedural hypothyroidism: Secondary | ICD-10-CM

## 2023-03-21 DIAGNOSIS — M5136 Other intervertebral disc degeneration, lumbar region: Secondary | ICD-10-CM | POA: Diagnosis not present

## 2023-03-21 DIAGNOSIS — R768 Other specified abnormal immunological findings in serum: Secondary | ICD-10-CM | POA: Diagnosis not present

## 2023-03-21 NOTE — Telephone Encounter (Signed)
Last refill Gabapentin 12/07/22 #180 1 RF Last RF: levothyroxine: 01/28/23 #90    Requested Prescriptions  Refused Prescriptions Disp Refills   gabapentin (NEURONTIN) 100 MG capsule 180 capsule 1    Sig: Take 2 capsules (200 mg total) by mouth at bedtime as needed.     Neurology: Anticonvulsants - gabapentin Failed - 03/21/2023 11:47 AM      Failed - Cr in normal range and within 360 days    Creat  Date Value Ref Range Status  11/26/2022 1.10 (H) 0.50 - 1.05 mg/dL Final   Creatinine, Ser  Date Value Ref Range Status  03/11/2023 1.34 (H) 0.44 - 1.00 mg/dL Final   Creatinine, Urine  Date Value Ref Range Status  11/26/2022 162 20 - 275 mg/dL Final         Passed - Completed PHQ-2 or PHQ-9 in the last 360 days      Passed - Valid encounter within last 12 months    Recent Outpatient Visits           1 month ago Welcome to [employer] preventive visit   [facility] Alba Cory, MD   2 months ago Well adult exam   [facility] Alba Cory, MD   3 months ago Dyslipidemia due to type 2 diabetes mellitus [facility])   [place] [facility], Danna Hefty, MD   8 months ago Dyslipidemia due to type 2 diabetes mellitus [facility])   [place] Alba Cory, MD   11 months ago Chest pain, unspecified type   [facility] Berniece Salines, FNP       Future Appointments             In 6 days Alba Cory, MD [facility]   In 10 months Alba Cory, MD [facility], [facility]             levothyroxine (SYNTHROID) 125 MCG tablet 90 tablet 0    Sig: Take 1 tablet (125 mcg total) by mouth daily.     Endocrinology:  Hypothyroid Agents Passed - 03/21/2023 11:47 AM      Passed - TSH in normal range and within 360 days    TSH  Date Value Ref Range Status  11/26/2022 1.12 0.40 - 4.50 mIU/L Final          Passed - Valid encounter within last 12 months    Recent Outpatient Visits           1 month ago Welcome to [employer] preventive visit   [facility] Alba Cory, MD   2 months ago Well adult exam   [facility] Alba Cory, MD   3 months ago Dyslipidemia due to type 2 diabetes mellitus [facility])   [place] [facility] Alba Cory, MD   8 months ago Dyslipidemia due to type 2 diabetes mellitus [facility])    [facility] Alba Cory, MD   11 months ago Chest pain, unspecified type   [facility] Berniece Salines, FNP       Future Appointments             In 6 days Alba Cory, MD [facility], [facility]   In 10 months Alba Cory, MD [facility]

## 2023-03-21 NOTE — Telephone Encounter (Signed)
Medication Refill - Medication: gabapentin (NEURONTIN) 100 MG capsule levothyroxine (SYNTHROID) 125 MCG tablet  Has the patient contacted their pharmacy? Yes.   Benadict from [facility] is calling requesting the medication refill.      Preferred Pharmacy (with phone number or street name):  [facility] [address] [place] - [address] Phone: [phone]  Fax: [phone]     Has the patient been seen for an appointment in the last year OR does the patient have an upcoming appointment? Yes.    Agent: Please be advised that RX refills may take up to 3 business days. We ask that you follow-up with your pharmacy.

## 2023-03-26 NOTE — Progress Notes (Unsigned)
Name: Andrea Randolph   MRN: [id number]    DOB: 1956-11-04   Date:03/27/2023       Progress Note  Subjective  Chief Complaint  Follow Up  HPI  DMII: she is currently taking Farxiga and Rybelsus, off Metformin . Last A1C was 6.4 %, 6.7 %, 6.6 %  up to 7.7 % and today is down to 6.5 %  She has dyslipidemia, HTN , CKI and obesity. She is on ARB, and Zetia started on Praluent since Feb 2023   She denies polyphagia, polydipsia or polyuria. She states statin therapy caused myalgia She has noticed some neuropathy on both heels  taking gabapentin and stable.   CKI stage III: last GFR was 56 , she has good urine output, no pruritis. . She is on ARB and SGL-2 agonist last urine micro was negative   LVH and aortic stenosis: under care of Dr. Mariah Randolph, no chest pain, palpitation or sob, she denies orthopnea She is on Farxiga  Mild pulmonary hypertension . She can walk for over one mile but has lack of motivation  Vitamin D low: she is now taking 2000 units daily, continue supplementation    Surgical hypothyroidism: she has been taking levothyroxine 125 mcg  and last TSH was at goal, last year was elevated because she was not taking by itself. Weight has been stable, no change in bowel movements or dry skin   Morbid obesity: BMI above 35 with co-morbidities. She has DM, HTN, dyslipidemia and OA. She has lack of motivation, discussed [name] book with patient   Radicular lumbosacral pain: under the care of Dr. Council Randolph , radiating down left leg now . SShe no longer wants steroid injections, but is going to start PT tomorrow  OA/Knee bilateral: saw Dr. Katrinka Randolph 09/22 and had steroid injection , pain is down to 1/10 on left knee, using Voltaren gel now since she got it from [facility]. Stable    Hyperlipidemia: she was Pravastatin  and Zetia . Recently off statin therapy due to muscle aches and elevated CK, last LDL over 180, now on zetia and praluent we will see if Andrea Randolph to see if she qualifies for  assistance    OSA: she does not tolerate CPAP machine. Unchanged    Breast Cancer right : diagnosed by abnormal mammogram back in March 22, lumpectomy was done 03/06/2021 followed by 5 weeks of radiation and finished 05/03/2021. She was doing well until she started on Arimidex, she was having body aches,  joint are stiff and aching, difficulty sleeping, change in bowel movement, increase in appetite. She is now off Arimidex and on Aromasin 25 mg still feels stiff , she has lymphedema on right arm . She is up to date with mammogram   Patient Active Problem List   Diagnosis Date Noted   Anemia in stage 3a chronic kidney disease (HCC) 09/06/2022   Genetic testing 03/28/2022   Stage 3b chronic kidney disease (HCC) 03/07/2022   Aromatase inhibitor use 05/30/2021   Complaints of total body pain 05/30/2021   Malignant 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, estrogen receptor positive (HCC) 02/20/2021   Thrombocytosis 11/01/2019   Carpal tunnel syndrome, bilateral 02/19/2019   Trigger finger of right thumb 02/19/2019   Flexor tenosynovitis of finger 02/19/2019   Severe obesity (BMI 35.0-35.9 with comorbidity) (HCC) 12/29/2018   Mild concentric left ventricular hypertrophy (LVH) 12/24/2017   Mild aortic stenosis by prior echocardiogram 12/24/2017   Mild aortic regurgitation 12/24/2017  CKD (chronic kidney disease), stage I 06/22/2016   Uterine fibroid 01/11/2016   Allergic rhinitis, seasonal 05/24/2015   Benign essential HTN 05/24/2015   Chronic constipation 05/24/2015   Diabetes mellitus with renal manifestation (HCC) 05/24/2015   Dyslipidemia 05/24/2015   Post-surgical hypothyroidism 05/24/2015   Mixed incontinence 05/24/2015   Microalbuminuria 05/24/2015   Menopause 05/24/2015   Obstructive apnea 05/24/2015   Morbid obesity (HCC) 05/24/2015   Hypertensive retinopathy 05/24/2015   Vitamin D deficiency 05/24/2015   Status post total replacement of right shoulder  03/11/2015    Past Surgical History:  Procedure Laterality Date   BREAST BIOPSY Left 90's   benign core needle    BREAST BIOPSY Right 02/07/2021   [place] Bx, Q-clip, DCIS   BREAST LUMPECTOMY Right 03/06/2021   BREAST LUMPECTOMY WITH SENTINEL LYMPH NODE BIOPSY Right 03/06/2021   Procedure: BREAST LUMPECTOMY WITH SENTINEL LYMPH NODE BX;  Surgeon: Earline Mayotte, MD;  Location: [facility] ORS;  Service: General;  Laterality: Right;   COLONOSCOPY WITH PROPOFOL N/A 01/26/2019   Procedure: COLONOSCOPY WITH PROPOFOL;  Surgeon: Toney Reil, MD;  Location: [facility] ENDOSCOPY;  Service: Gastroenterology;  Laterality: N/A;   COLONOSCOPY WITH PROPOFOL N/A 02/09/2022   Procedure: COLONOSCOPY WITH PROPOFOL;  Surgeon: Wyline Mood, MD;  Location: [facility] ENDOSCOPY;  Service: Gastroenterology;  Laterality: N/A;   FOOT SURGERY Bilateral    heel spurs   JOINT REPLACEMENT     TOTAL SHOULDER REPLACEMENT Right 01/25/2015   Dr. Joice Lofts   TOTAL THYROIDECTOMY  08/01/2018    Family History  Problem Relation Age of Onset   Heart disease Mother    Heart disease Father    Kidney disease Sister    Hypertension Sister    Diabetes Sister    Hypertension Sister    Kidney failure Sister    Hypertension Brother    CVA Brother    Breast cancer Maternal Aunt 50   Ovarian cancer Neg Hx    Colon cancer Neg Hx     Social History   Tobacco Use   Smoking status: Never   Smokeless tobacco: Never  Substance Use Topics   Alcohol use: No    Alcohol/week: 0.0 standard drinks of alcohol     Current Outpatient Medications:    Alirocumab (PRALUENT) 150 MG/ML SOAJ, Inject 1 mL into the skin every 14 (fourteen) days., Disp: 6 mL, Rfl: 1   aspirin 81 MG tablet, Take 81 mg by mouth daily., Disp: , Rfl:    Calcium Carb-Cholecalciferol (CALCIUM 500+D3 PO), Take 2 tablets by mouth daily., Disp: , Rfl:    Cholecalciferol (VITAMIN D3) 1000 units CAPS, Take 1,000 Units by mouth daily., Disp: , Rfl:    dapagliflozin propanediol  (FARXIGA) 10 MG TABS tablet, Take 1 tablet (10 mg total) by mouth daily., Disp: 90 tablet, Rfl: 1   diclofenac Sodium (VOLTAREN) 1 % GEL, Apply 2 g topically 4 (four) times daily as needed., Disp: 100 g, Rfl: 2   exemestane (AROMASIN) 25 MG tablet, Take 1 tablet (25 mg total) by mouth daily., Disp: 90 tablet, Rfl: 3   ezetimibe (ZETIA) 10 MG tablet, Take 1 tablet (10 mg total) by mouth daily., Disp: 90 tablet, Rfl: 0   gabapentin (NEURONTIN) 100 MG capsule, Take 2 capsules (200 mg total) by mouth at bedtime as needed., Disp: 180 capsule, Rfl: 1   glucose blood test strip, , Disp: , Rfl:    ketoconazole (NIZORAL) 2 % cream, Apply 1 application topically daily., Disp: 60 g, Rfl: 0  levothyroxine (SYNTHROID) 125 MCG tablet, Take 1 tablet (125 mcg total) by mouth daily., Disp: 90 tablet, Rfl: 0   Olmesartan-amLODIPine-HCTZ 40-5-25 MG TABS, Take 1 tablet by mouth daily at 12 noon., Disp: 90 tablet, Rfl: 1   Semaglutide (RYBELSUS) 14 MG TABS, Take 1 tablet (14 mg total) by mouth daily at 6 (six) AM., Disp: 90 tablet, Rfl: 0  Allergies  Allergen Reactions   Pantoprazole Other (See Comments)    Affects thyroid    Rosuvastatin Other (See Comments)    Myalgia     I personally reviewed active problem list, medication list, allergies, family history, social history, health maintenance with the patient/caregiver today.   ROS  Constitutional: Negative for fever or weight change.  Respiratory: Negative for cough and shortness of breath.   Cardiovascular: Negative for chest pain or palpitations.  Gastrointestinal: Negative for abdominal pain, no bowel changes.  Musculoskeletal: Negative for gait problem or joint swelling.  Skin: Negative for rash.  Neurological: Negative for dizziness or headache.  No other specific complaints in a complete review of systems (except as listed in HPI above).   Objective  Vitals:   03/27/23 1017  BP: 124/72  Pulse: 92  Resp: 16  SpO2: 97%  Weight: 222 lb  (100.7 kg)  Height: 5\' 4"  (1.626 m)    Body mass index is 38.11 kg/m.  Physical Exam  Constitutional: Patient appears well-developed and well-nourished. Obese  No distress.  HEENT: head atraumatic, normocephalic, pupils equal and reactive to light, neck supple Cardiovascular: Normal rate, regular rhythm and normal heart sounds.  No murmur heard. Right arm lymphedema Pulmonary/Chest: Effort normal and breath sounds normal. No respiratory distress. Abdominal: Soft.  There is no tenderness. Psychiatric: Patient has a normal mood and affect. behavior is normal. Judgment and thought content normal.   Recent Results (from the past 2160 hour(s))  Cytology - PAP     Status: None   Collection Time: 01/14/23  8:56 AM  Result Value Ref Range   High risk HPV Negative    Adequacy      Satisfactory for evaluation. The presence or absence of an   Adequacy      endocervical/transformation zone component cannot be determined because   Adequacy of atrophy.    Diagnosis      - Negative for intraepithelial lesion or malignancy (NILM)   Comment Normal Reference Range HPV - Negative   Comprehensive metabolic panel     Status: Abnormal   Collection Time: 03/11/23  8:17 AM  Result Value Ref Range   Sodium 135 135 - 145 mmol/L   Potassium 3.4 (L) 3.5 - 5.1 mmol/L   Chloride 102 98 - 111 mmol/L   CO2 25 22 - 32 mmol/L   Glucose, Bld 129 (H) 70 - 99 mg/dL    Comment: Glucose reference range applies only to samples taken after fasting for at least 8 hours.   BUN 15 8 - 23 mg/dL   Creatinine, Ser 1.61 (H) 0.44 - 1.00 mg/dL   Calcium 8.9 8.9 - 09.6 mg/dL   Total Protein 7.1 6.5 - 8.1 g/dL   Albumin 3.7 3.5 - 5.0 g/dL   AST 20 15 - 41 U/L   ALT 17 0 - 44 U/L   Alkaline Phosphatase 44 38 - 126 U/L   Total Bilirubin 0.4 0.3 - 1.2 mg/dL   GFR, Estimated 44 (L) >60 mL/min    Comment: (NOTE) Calculated using the CKD-EPI Creatinine Equation (2021)    Anion gap 8  5 - 15    Comment: Performed at [facility], [address] [place], [place] [address]  CBC with Differential/Platelet     Status: Abnormal   Collection Time: 03/11/23  8:17 AM  Result Value Ref Range   WBC 3.6 (L) 4.0 - 10.5 K/uL   RBC 4.64 3.87 - 5.11 MIL/uL   Hemoglobin 12.8 12.0 - 15.0 g/dL   HCT 60.4 54.0 - 98.1 %   MCV 83.2 80.0 - 100.0 fL   MCH 27.6 26.0 - 34.0 pg   MCHC 33.2 30.0 - 36.0 g/dL   RDW 19.1 47.8 - 29.5 %   Platelets 332 150 - 400 K/uL   nRBC 0.0 0.0 - 0.2 %   Neutrophils Relative % 49 %   Neutro Abs 1.8 1.7 - 7.7 K/uL   Lymphocytes Relative 37 %   Lymphs Abs 1.3 0.7 - 4.0 K/uL   Monocytes Relative 8 %   Monocytes Absolute 0.3 0.1 - 1.0 K/uL   Eosinophils Relative 5 %   Eosinophils Absolute 0.2 0.0 - 0.5 K/uL   Basophils Relative 1 %   Basophils Absolute 0.0 0.0 - 0.1 K/uL   Immature Granulocytes 0 %   Abs Immature Granulocytes 0.01 0.00 - 0.07 K/uL    Comment: Performed at [facility], [address] [place], [place] [address]  POCT HgB A1C     Status: Abnormal   Collection Time: 03/27/23 10:18 AM  Result Value Ref Range   Hemoglobin A1C 6.5 (A) 4.0 - 5.6 %   HbA1c POC (<> result, manual entry)     HbA1c, POC (prediabetic range)     HbA1c, POC (controlled diabetic range)      PHQ2/9:    03/27/2023   10:17 AM 01/30/2023    1:33 PM 01/14/2023    8:21 AM 11/26/2022    2:58 PM 07/17/2022    3:26 PM  Depression screen PHQ 2/9  Decreased Interest 0 0 1 0 0  Down, Depressed, Hopeless 0 0 0 0 0  PHQ - 2 Score 0 0 1 0 0  Altered sleeping 0 0 1 0 0  Tired, decreased energy 0 0 1 0 0  Change in appetite 0 0 0 0 0  Feeling bad or failure about yourself  0 0 0 0 0  Trouble concentrating 0 0 0 0 0  Moving slowly or fidgety/restless 0 0 0 0 0  Suicidal thoughts 0 0 0 0 0  PHQ-9 Score 0 0 3 0 0  Difficult doing work/chores   Not difficult at all      phq 9 is negative   Fall Risk:    03/27/2023   10:17 AM 01/30/2023    1:33 PM 01/14/2023    8:20 AM 11/26/2022    2:57 PM  07/17/2022    3:25 PM  Fall Risk   Falls in the past year? 0 0 0 0 0  Number falls in past yr: 0 0  0   Injury with Fall? 0 0  0   Risk for fall due to : No Fall Risks No Fall Risks No Fall Risks No Fall Risks No Fall Risks  Follow up Falls prevention discussed Falls prevention discussed Falls prevention discussed;Education provided;Falls evaluation completed Falls prevention discussed Falls prevention discussed;Falls evaluation completed;Education provided      Functional Status Survey: Is the patient deaf or have difficulty hearing?: No Does the patient have difficulty seeing, even when wearing glasses/contacts?: No Does the patient  have difficulty concentrating, remembering, or making decisions?: No Does the patient have difficulty walking or climbing stairs?: No Does the patient have difficulty dressing or bathing?: No Does the patient have difficulty doing errands alone such as visiting a doctor's office or shopping?: No    Assessment & Plan  1. Type 2 diabetes mellitus with stage 3a chronic kidney disease, without long-term current use of insulin (HCC)  On ARB , Rybelsus and Farxiga   2. Dyslipidemia due to type 2 diabetes mellitus (HCC)  - POCT HgB A1C - ezetimibe (ZETIA) 10 MG tablet; Take 1 tablet (10 mg total) by mouth daily.  Dispense: 90 tablet; Refill: 1 - Alirocumab (PRALUENT) 150 MG/ML SOAJ; Inject 1 mL (150 mg total) into the skin every 14 (fourteen) days.  Dispense: 6 mL; Refill: 1  3. Morbid obesity (HCC)  Discussed with the patient the risk posed by an increased BMI. Discussed importance of portion control, calorie counting and at least 150 minutes of physical activity weekly. Avoid sweet beverages and drink more water. Eat at least 6 servings of fruit and vegetables daily    4. Stage 3a chronic kidney disease (HCC)  Avoid nsaid's on ARB  5. Mild pulmonary hypertension (HCC)  Bp is at goal , sees cardiologist   6. Statin myopathy  - ezetimibe (ZETIA)  10 MG tablet; Take 1 tablet (10 mg total) by mouth daily.  Dispense: 90 tablet; Refill: 1 - Alirocumab (PRALUENT) 150 MG/ML SOAJ; Inject 1 mL (150 mg total) into the skin every 14 (fourteen) days.  Dispense: 6 mL; Refill: 1  7. Post-surgical hypothyroidism  - levothyroxine (SYNTHROID) 125 MCG tablet; Take 1 tablet (125 mcg total) by mouth daily.  Dispense: 90 tablet; Refill: 0  8. Dyslipidemia  - ezetimibe (ZETIA) 10 MG tablet; Take 1 tablet (10 mg total) by mouth daily.  Dispense: 90 tablet; Refill: 1  9. Hypertensive left ventricular hypertrophy, without heart failure   10. Back pain with radiculopathy  - gabapentin (NEURONTIN) 100 MG capsule; Take 2 capsules (200 mg total) by mouth at bedtime as needed.  Dispense: 180 capsule; Refill: 1

## 2023-03-27 ENCOUNTER — Other Ambulatory Visit: Payer: Self-pay | Admitting: Family Medicine

## 2023-03-27 ENCOUNTER — Encounter: Payer: Self-pay | Admitting: Family Medicine

## 2023-03-27 ENCOUNTER — Ambulatory Visit (INDEPENDENT_AMBULATORY_CARE_PROVIDER_SITE_OTHER): Payer: Medicare HMO | Admitting: Family Medicine

## 2023-03-27 VITALS — BP 124/72 | HR 92 | Resp 16 | Ht 64.0 in | Wt 222.0 lb

## 2023-03-27 DIAGNOSIS — M5410 Radiculopathy, site unspecified: Secondary | ICD-10-CM

## 2023-03-27 DIAGNOSIS — G720 Drug-induced myopathy: Secondary | ICD-10-CM | POA: Diagnosis not present

## 2023-03-27 DIAGNOSIS — Z7984 Long term (current) use of oral hypoglycemic drugs: Secondary | ICD-10-CM | POA: Diagnosis not present

## 2023-03-27 DIAGNOSIS — E785 Hyperlipidemia, unspecified: Secondary | ICD-10-CM

## 2023-03-27 DIAGNOSIS — T466X5A Adverse effect of antihyperlipidemic and antiarteriosclerotic drugs, initial encounter: Secondary | ICD-10-CM

## 2023-03-27 DIAGNOSIS — E890 Postprocedural hypothyroidism: Secondary | ICD-10-CM

## 2023-03-27 DIAGNOSIS — I272 Pulmonary hypertension, unspecified: Secondary | ICD-10-CM

## 2023-03-27 DIAGNOSIS — N1831 Chronic kidney disease, stage 3a: Secondary | ICD-10-CM | POA: Diagnosis not present

## 2023-03-27 DIAGNOSIS — I119 Hypertensive heart disease without heart failure: Secondary | ICD-10-CM | POA: Diagnosis not present

## 2023-03-27 DIAGNOSIS — E1169 Type 2 diabetes mellitus with other specified complication: Secondary | ICD-10-CM | POA: Diagnosis not present

## 2023-03-27 DIAGNOSIS — E1122 Type 2 diabetes mellitus with diabetic chronic kidney disease: Secondary | ICD-10-CM | POA: Diagnosis not present

## 2023-03-27 LAB — POCT GLYCOSYLATED HEMOGLOBIN (HGB A1C): Hemoglobin A1C: 6.5 % — AB (ref 4.0–5.6)

## 2023-03-27 MED ORDER — EZETIMIBE 10 MG PO TABS: 10.0000 mg | ORAL_TABLET | Freq: Every day | ORAL | 1 refills | Status: DC

## 2023-03-27 MED ORDER — LEVOTHYROXINE SODIUM 125 MCG PO TABS: 125.0000 ug | ORAL_TABLET | Freq: Every day | ORAL | 0 refills | Status: DC

## 2023-03-27 MED ORDER — GABAPENTIN 100 MG PO CAPS: 200.0000 mg | ORAL_CAPSULE | Freq: Every evening | ORAL | 1 refills | Status: DC | PRN

## 2023-03-27 MED ORDER — PRALUENT 150 MG/ML ~~LOC~~ SOAJ: 1.0000 mL | SUBCUTANEOUS | 1 refills | Status: DC

## 2023-03-27 NOTE — Patient Instructions (Signed)
[name] , Andrea Randolph

## 2023-03-28 DIAGNOSIS — M5136 Other intervertebral disc degeneration, lumbar region: Secondary | ICD-10-CM | POA: Diagnosis not present

## 2023-03-28 DIAGNOSIS — M5416 Radiculopathy, lumbar region: Secondary | ICD-10-CM | POA: Diagnosis not present

## 2023-03-29 ENCOUNTER — Telehealth: Payer: Self-pay

## 2023-03-29 NOTE — Progress Notes (Addendum)
Care Management & Coordination Services Pharmacy Team  Reason for Encounter: Appointment Reminder  Contacted patient to confirm in office appointment with 04/03/2023, PharmD on [address], [facility] at 10:00 am.  {US HC Outreach:28874}  Do you have any problems getting your medications? {yes/no:20286} If yes what types of problems are you experiencing? {Problems:27223}  What is your top health concern you would like to discuss at your upcoming visit?   Have you seen any other providers since your last visit with PCP? {yes/no:20286}  I received a task from [name], CPP requesting that I start the application for patient assistance on the medication Farxiga, and signing her up for the [employer] for Praluent.    Spoke with the patient and she gave verbal consent to complete online application for patient assistance for Farxiga through AZ[employer]ME website.Per AZ&ME,Patient has an active enrollment already for the selected product. To verify that you are an authorized healthcare provider for the Patient, please call 1-800-AZandMe (816-293-1048).AZ&ME is close on Fridays, so I will reach out on Monday to check on patient application status.     Chart review:  Recent office visits:  03/27/2023 Dr. Carlynn Purl MD (PCP) No Medication changes noted, labwork completed - A1C- 6.5%,Return in about 4 months  01/30/2023 Dr. Carlynn Purl MD (PCP) No Medication changes noted, Welcome to medicare preventive visit completed 01/14/2023 Dr. Carlynn Purl MD (PCP) No medication changes noted annual exam completed 11/26/2022 Dr. Carlynn Purl MD (PCP) Start Pradaxa, , labwork completed -A1C- 7.7%,Ambulatory referral to Dermatology, Return in about 4 months    Recent consult visits:  03/21/2023 Dr. Allena Katz MD (Rheumatology) No medication changes noted, Ambulatory Referral to Physical Therapy , Return in about 1 year  03/11/2023 Dr. Cathie Hoops MD (Oncology) Recommend calcium 1200 mg daily., start Aromasin 12/13/2022 [name] PA-C  (Cardiology) No Medication changes noted, Follow up in 6 months  Hospital visits:  None in previous 6 months   Star Rating Drugs:  Farxiga 10 mg Last Fill: 03/21/2023 30 Day [facility] at [facility]. Olmesartan-amLODIPine-HCTZ 40-5-25 MG mg Last Fill: 03/20/2023 30 Day Supply at [facility]. Rybelsus 14 mg Last Fill: 03/15/2023 30 Day Supply at [facility].  Care Gaps: Annual wellness visit in last year? Patient just receive medicare insurance.   If Diabetic: Last eye exam / retinopathy screening:None ID Last diabetic foot exam:None ID   Everlean Cherry Clinical Pharmacist Assistant [phone]

## 2023-04-03 ENCOUNTER — Encounter: Payer: Self-pay | Admitting: Physician Assistant

## 2023-04-03 ENCOUNTER — Ambulatory Visit: Payer: Medicare HMO

## 2023-04-03 ENCOUNTER — Ambulatory Visit (INDEPENDENT_AMBULATORY_CARE_PROVIDER_SITE_OTHER): Payer: Medicare HMO | Admitting: Physician Assistant

## 2023-04-03 VITALS — BP 126/74 | HR 92 | Temp 99.4°F | Resp 18 | Ht 64.0 in | Wt 218.5 lb

## 2023-04-03 DIAGNOSIS — E1122 Type 2 diabetes mellitus with diabetic chronic kidney disease: Secondary | ICD-10-CM

## 2023-04-03 DIAGNOSIS — E1169 Type 2 diabetes mellitus with other specified complication: Secondary | ICD-10-CM

## 2023-04-03 DIAGNOSIS — J069 Acute upper respiratory infection, unspecified: Secondary | ICD-10-CM

## 2023-04-03 DIAGNOSIS — T466X5A Adverse effect of antihyperlipidemic and antiarteriosclerotic drugs, initial encounter: Secondary | ICD-10-CM

## 2023-04-03 NOTE — Patient Instructions (Addendum)
Based on your described symptoms and the duration of symptoms it is likely that you have a viral upper respiratory infection (often called a "cold")  Symptoms can last for 3-10 days with lingering cough and intermittent symptoms lasting weeks after that.  The goal of treatment at this time is to reduce your symptoms and discomfort   I recommend using Robitussin and Mucinex (regular formulations, nothing with decongestants or DM)  You can also use Tylenol for body aches and fever reduction I also recommend adding an antihistamine to your daily regimen This includes medications like Claritin, Allegra, Zyrtec- the generics of these work very well and are usually less expensive I recommend using Flonase nasal spray - 2 puffs twice per day to help with your nasal congestion The antihistamines and Flonase can take a few weeks to provide significant relief from allergy symptoms but should start to provide some benefit soon. You can use a humidifier at night to help with preventing nasal dryness and irritation   If your symptoms do not improve or become worse in the next 5-7 days please make an apt at the office so we can see you  Go to the ER if you begin to have more serious symptoms such as shortness of breath, trouble breathing, loss of consciousness, swelling around the eyes, high fever, severe lasting headaches, vision changes or neck pain/stiffness.   It was nice to meet you and I appreciate the opportunity to be involved in your care If you were satisfied with the care you received from me, I would greatly appreciate you saying so in the after-visit survey that is sent out following our visit.

## 2023-04-03 NOTE — Progress Notes (Unsigned)
Acute Office Visit   Patient: Andrea Randolph   DOB: May 13, 1956   67 y.o. Female  MRN: [id number] Visit Date: 04/03/2023  Today's healthcare provider: Oswaldo Conroy Liesa Tsan, PA-C  Introduced myself to the patient as a Secondary school teacher and provided education on APPs in clinical practice.    Chief Complaint  Patient presents with   sneezing   Nasal Congestion   Cough   Generalized Body Aches   Fever    Sx started since Thursday   Subjective    HPI HPI     Fever    Additional comments: Sx started since Thursday      Last edited by Forde Radon, CMA on 04/03/2023 10:58 AM.       She reports she started feeling sore throat on Thurs last week and her worst day was Wynelle Link She reports having sore throat, congestion, low fevers, chills, dry cough, myalgias, sneezing Interventions: children's sinus medication- unsure what it was  She reports her symptoms seem unchanged but she feels a bit better than she did Sun   Recent sick contacts: She reports her sister has had a persistent cough Recent Travel: none  COVID testing: has not tested at home    Medications: Outpatient Medications Prior to Visit  Medication Sig   Alirocumab (PRALUENT) 150 MG/ML SOAJ Inject 1 mL (150 mg total) into the skin every 14 (fourteen) days.   aspirin 81 MG tablet Take 81 mg by mouth daily.   Calcium Carb-Cholecalciferol (CALCIUM 500+D3 PO) Take 2 tablets by mouth daily.   Cholecalciferol (VITAMIN D3) 1000 units CAPS Take 1,000 Units by mouth daily.   cyanocobalamin (VITAMIN B12) 1000 MCG tablet Take 1,000 mcg by mouth daily.   dapagliflozin propanediol (FARXIGA) 10 MG TABS tablet Take 1 tablet (10 mg total) by mouth daily.   diclofenac Sodium (VOLTAREN) 1 % GEL Apply 2 g topically 4 (four) times daily as needed.   exemestane (AROMASIN) 25 MG tablet Take 1 tablet (25 mg total) by mouth daily. (Patient taking differently: Take 25 mg by mouth every other day.)   ezetimibe (ZETIA) 10 MG tablet Take 1  tablet (10 mg total) by mouth daily.   gabapentin (NEURONTIN) 100 MG capsule Take 2 capsules (200 mg total) by mouth at bedtime as needed.   glucose blood test strip    ketoconazole (NIZORAL) 2 % cream Apply 1 application topically daily.   levothyroxine (SYNTHROID) 125 MCG tablet Take 1 tablet (125 mcg total) by mouth daily.   Olmesartan-amLODIPine-HCTZ 40-5-25 MG TABS Take 1 tablet by mouth daily at 12 noon.   Semaglutide (RYBELSUS) 14 MG TABS Take 1 tablet (14 mg total) by mouth daily at 6 (six) AM.   No facility-administered medications prior to visit.    Review of Systems  Constitutional:  Positive for chills, fatigue and fever.  HENT:  Positive for congestion, rhinorrhea, sneezing and sore throat. Negative for ear pain, postnasal drip, sinus pressure and sinus pain.   Respiratory:  Positive for cough. Negative for shortness of breath and wheezing.   Gastrointestinal:  Positive for nausea. Negative for diarrhea and vomiting.  Musculoskeletal:  Positive for myalgias.  Neurological:  Positive for light-headedness and headaches.    {Labs  Heme  Chem  Endocrine  Serology  Results Review (optional):23779}   Objective    There were no vitals taken for this visit. {Show previous vital signs (optional):23777}  Physical Exam Vitals reviewed.  Constitutional:  General: She is awake.     Appearance: Normal appearance. She is well-developed and well-groomed.  HENT:     Head: Normocephalic and atraumatic.  Neurological:     Mental Status: She is alert.  Psychiatric:        Behavior: Behavior is cooperative.       No results found for any visits on 04/03/23.  Assessment & Plan      No follow-ups on file.

## 2023-04-03 NOTE — Patient Instructions (Addendum)
Visit Information It was great speaking with you today!  Please let me know if you have any questions about our visit.  Plan:  I will help you get patient assistance for your Praluent and Marcelline Deist  Switch your Rybelsus to 30 minutes before breakfast with a few sips of water. Switch your levothyroxine to bedtime.   Print copy of patient instructions, educational materials, and care plan provided in person.   Everlean Cherry Clinical Pharmacist Assistant  Cheyenne Adas, CPP Clinical Pharmacist Practitioner  [facility]  [phone]

## 2023-04-03 NOTE — Progress Notes (Unsigned)
Care Management & Coordination Services Pharmacy Note  04/03/2023 Name:  Andrea Randolph MRN:  [id number] DOB:  12-Sep-1956  Summary: ***  Recommendations/Changes made from today's visit: ***  Follow up plan: ***   Patient-Specific Goals:  Subjective: Andrea Randolph is an 67 y.o. year old female who is a primary patient of Andrea Cory, MD.  The care coordination team was consulted for assistance with disease management and care coordination needs.    {CCMTELEPHONEFACETOFACE:21091510} for {CCMINITIALFOLLOWUPCHOICE:21091511}.  Recent office visits: 03/27/2023 Dr. Carlynn Purl MD (PCP) No Medication changes noted, labwork completed - A1C- 6.5%,Return in about 4 months  01/30/2023 Dr. Carlynn Purl MD (PCP) No Medication changes noted, Welcome to medicare preventive visit completed 01/14/2023 Dr. Carlynn Purl MD (PCP) No medication changes noted annual exam completed 11/26/2022 Dr. Carlynn Purl MD (PCP) Start Pradaxa, , labwork completed -A1C- 7.7%,Ambulatory referral to Dermatology, Return in about 4 months    Recent consult visits: 03/21/2023 Dr. Allena Katz MD (Rheumatology) No medication changes noted, Ambulatory Referral to Physical Therapy , Return in about 1 year  03/11/2023 Dr. Cathie Hoops MD (Oncology) Recommend calcium 1200 mg daily., start Aromasin 12/13/2022 Andrea Fransico Michael PA-C (Cardiology) No Medication changes noted, Follow up in 6 months  Hospital visits: None in previous 6 months    Objective:  Lab Results  Component Value Date   CREATININE 1.34 (H) 03/11/2023   BUN 15 03/11/2023   EGFR 56 (L) 11/26/2022   GFRNONAA 44 (L) 03/11/2023   GFRAA 59 (L) 01/26/2021   NA 135 03/11/2023   K 3.4 (L) 03/11/2023   CALCIUM 8.9 03/11/2023   CO2 25 03/11/2023   GLUCOSE 129 (H) 03/11/2023    Lab Results  Component Value Date/Time   HGBA1C 6.5 (A) 03/27/2023 10:18 AM   HGBA1C 7.7 (A) 11/26/2022 02:59 PM   HGBA1C 6.7 (H) 03/07/2022 09:10 AM   HGBA1C 6.3 03/30/2019 09:43 AM   HGBA1C 7.0 (H) 12/29/2018 11:21  AM   HGBA1C 6.7 06/26/2018 09:06 AM   MICROALBUR 3.4 11/26/2022 03:34 PM   MICROALBUR 1.6 12/06/2021 08:43 AM   MICROALBUR 20 03/26/2018 09:31 AM   MICROALBUR 20 12/21/2015 08:14 AM    Last diabetic Eye exam:  Lab Results  Component Value Date/Time   HMDIABEYEEXA No Retinopathy 04/06/2022 12:00 AM    Last diabetic Foot exam: No results found for: "HMDIABFOOTEX"   Lab Results  Component Value Date   CHOL 181 11/26/2022   HDL 50 11/26/2022   LDLCALC 112 (H) 11/26/2022   TRIG 91 11/26/2022   CHOLHDL 3.6 11/26/2022       Latest Ref Rng & Units 03/11/2023    8:17 AM 11/26/2022    3:34 PM 09/06/2022   10:46 AM  Hepatic Function  Total Protein 6.5 - 8.1 g/dL 7.1  6.8  7.8   Albumin 3.5 - 5.0 g/dL 3.7   4.0   AST 15 - 41 U/L 20  15  18    ALT 0 - 44 U/L 17  25  19    Alk Phosphatase 38 - 126 U/L 44   49   Total Bilirubin 0.3 - 1.2 mg/dL 0.4  0.6  0.6     Lab Results  Component Value Date/Time   TSH 1.12 11/26/2022 03:34 PM   TSH 1.49 06/25/2022 03:43 PM       Latest Ref Rng & Units 03/11/2023    8:17 AM 09/06/2022   10:46 AM 04/02/2022    2:50 PM  CBC  WBC 4.0 - 10.5 K/uL 3.6  3.4  9.2  Hemoglobin 12.0 - 15.0 g/dL 65.7  84.6  96.2   Hematocrit 36.0 - 46.0 % 38.6  43.3  45.9   Platelets 150 - 400 K/uL 332  362  372     Lab Results  Component Value Date/Time   VD25OH 63 11/26/2022 03:34 PM   VD25OH 52 12/06/2021 08:43 AM   VITAMINB12 618 12/29/2018 11:21 AM   VITAMINB12 463 09/19/2017 10:19 AM    Clinical ASCVD: {YES/NO:21197} The 10-year ASCVD risk score (Arnett DK, et al., 2019) is: 15.7%   Values used to calculate the score:     Age: 62 years     Sex: Female     Is Non-Hispanic African American: Yes     Diabetic: Yes     Tobacco smoker: No     Systolic Blood Pressure: 124 mmHg     Is BP treated: No     HDL Cholesterol: 50 mg/dL     Total Cholesterol: 181 mg/dL    ***Other: ([id number] if Afib, MMRC or CAT for COPD, ACT, DEXA)     03/27/2023   10:17  AM 01/30/2023    1:33 PM 01/14/2023    8:21 AM  Depression screen PHQ 2/9  Decreased Interest 0 0 1  Down, Depressed, Hopeless 0 0 0  PHQ - 2 Score 0 0 1  Altered sleeping 0 0 1  Tired, decreased energy 0 0 1  Change in appetite 0 0 0  Feeling bad or failure about yourself  0 0 0  Trouble concentrating 0 0 0  Moving slowly or fidgety/restless 0 0 0  Suicidal thoughts 0 0 0  PHQ-9 Score 0 0 3  Difficult doing work/chores   Not difficult at all     Social History   Tobacco Use  Smoking Status Never  Smokeless Tobacco Never   BP Readings from Last 3 Encounters:  03/27/23 124/72  03/11/23 132/81  01/30/23 124/72   Pulse Readings from Last 3 Encounters:  03/27/23 92  03/11/23 80  01/30/23 95   Wt Readings from Last 3 Encounters:  03/27/23 222 lb (100.7 kg)  03/11/23 223 lb 14.4 oz (101.6 kg)  01/30/23 225 lb (102.1 kg)   BMI Readings from Last 3 Encounters:  03/27/23 38.11 kg/m  03/11/23 38.43 kg/m  01/30/23 38.62 kg/m    Allergies  Allergen Reactions   Pantoprazole Other (See Comments)    Affects thyroid    Rosuvastatin Other (See Comments)    Myalgia     Medications Reviewed Today     Reviewed by Andrea Randolph, CMA (Certified Medical Assistant) on 03/27/23 at 1017  Med List Status: <None>   Medication Order Taking? Sig Documenting Provider Last Dose Status Informant  Alirocumab (PRALUENT) 150 MG/ML SOAJ [id number] Yes Inject 1 mL into the skin every 14 (fourteen) days. Andrea Cory, MD Taking Active   aspirin 81 MG tablet [id number] Yes Take 81 mg by mouth daily. [provider] Taking Active Self, Pharmacy Records, Multiple Informants, Other           Med Note Andrea Randolph Jun 27, 2020  3:41 PM)    Calcium Carb-Cholecalciferol (CALCIUM 500+D3 PO) [id number] Yes Take 2 tablets by mouth daily. [provider] Taking Active Pharmacy Records, Multiple Informants, Self, Other  Cholecalciferol (VITAMIN D3) 1000 units CAPS [id number]  Yes Take 1,000 Units by mouth daily. [provider] Taking Active Self, Pharmacy Records, Multiple Informants, Other  dapagliflozin propanediol (FARXIGA) 10 MG TABS tablet [id number] Yes  Take 1 tablet (10 mg total) by mouth daily. Andrea Cory, MD Taking Active   diclofenac Sodium (VOLTAREN) 1 % GEL [id number] Yes Apply 2 g topically 4 (four) times daily as needed. Andrea Cory, MD Taking Active Pharmacy Records, Multiple Informants, Self, Other  exemestane (AROMASIN) 25 MG tablet [id number] Yes Take 1 tablet (25 mg total) by mouth daily. Rickard Patience, MD Taking Active   ezetimibe (ZETIA) 10 MG tablet [id number] Yes Take 1 tablet (10 mg total) by mouth daily. Andrea Cory, MD Taking Active   gabapentin (NEURONTIN) 100 MG capsule [id number] Yes Take 2 capsules (200 mg total) by mouth at bedtime as needed. Andrea Cory, MD Taking Active   glucose blood test strip [id number] Yes  [provider] Taking Active Self, Pharmacy Records, Multiple Informants, Other           Med Note Andrea Randolph Jun 27, 2020  3:42 PM)    ketoconazole (NIZORAL) 2 % cream [id number] Yes Apply 1 application topically daily. Andrea Cory, MD Taking Active Pharmacy Records, Multiple Informants, Self, Other  levothyroxine (SYNTHROID) 125 MCG tablet [id number] Yes Take 1 tablet (125 mcg total) by mouth daily. Andrea Cory, MD Taking Active   Olmesartan-amLODIPine-HCTZ 40-5-25 MG TABS [id number] Yes Take 1 tablet by mouth daily at 12 noon. Andrea Cory, MD Taking Active   Semaglutide [facility]) 14 MG TABS [id number] Yes Take 1 tablet (14 mg total) by mouth daily at 6 (six) AM. Andrea Cory, MD Taking Active             SDOH:  (Social Determinants of Health) assessments and interventions performed: {yes/no:20286} SDOH Interventions    Flowsheet Row Office Visit from 01/14/2023 in [facility]  SDOH Interventions   Food Insecurity Interventions Intervention Not  Indicated  Housing Interventions Intervention Not Indicated  Transportation Interventions Intervention Not Indicated  Utilities Interventions Intervention Not Indicated  Financial Strain Interventions Intervention Not Indicated  Stress Interventions Intervention Not Indicated  Social Connections Interventions Intervention Not Indicated       Medication Assistance:  Rybelsus obtained through [employer] medication assistance program.  Enrollment ends Dec 2024  Farxiga obtained through [place] medication assistance program.    Praluent obtained through [employer]. Enrollment ends May 2025  Medication Access: Within the past 30 days, how often has patient missed a dose of medication? *** Is a pillbox or other method used to improve adherence? {YES/NO:21197} Factors that may affect medication adherence? {CHL DESC; BARRIERS:21522} Are meds synced by current pharmacy? {YES/NO:21197} Are meds delivered by current pharmacy? {YES/NO:21197} Does patient experience delays in picking up medications due to transportation concerns? {YES/NO:21197}  Compliance/Adherence/Medication fill history: Care Gaps: ***  Star-Rating Drugs: Farxiga 10 mg Last Fill: 03/21/2023 [facility]. Olmesartan-amLODIPine-HCTZ 40-5-25 MG mg Last Fill: 03/20/2023 30 Day Supply at [facility]. Rybelsus 14 mg Last Fill: 03/15/2023 [facility] Day [facility].   Assessment/Plan   Hypertension (BP goal <130/80) -{US controlled/uncontrolled:25276} -Current treatment: Olmesartan-Amlodipine-HCTZ 40-5-25 mg  -Medications previously tried: ***  -Current home readings: *** -Current dietary habits: *** -Current exercise habits: *** -{ACTIONS;DENIES/REPORTS:21021675::"Denies"} hypotensive/hypertensive symptoms -Educated on {CCM BP Counseling:25124} -Counseled to monitor BP at home ***, document, and provide log at future  appointments -{CCMPHARMDINTERVENTION:25122}  Hyperlipidemia: (LDL goal < ***) -{US controlled/uncontrolled:25276} -Current treatment: Ezetimibe 10 mg daily  Praluent 150 mg every 14 days  -Medications previously tried: Pravastatin (Muscle aches)   -Current dietary patterns: *** -Current exercise habits: *** -Educated on {CCM HLD  Counseling:25126} -{CCMPHARMDINTERVENTION:25122}  Diabetes (A1c goal <7%) -{US controlled/uncontrolled:25276} -Current medications: Farxiga 10 mg daily  -Medications previously tried: ***  -Current home glucose readings fasting glucose: *** post prandial glucose: *** -{ACTIONS;DENIES/REPORTS:21021675::"Denies"} hypoglycemic/hyperglycemic symptoms -Current meal patterns:  breakfast: ***  lunch: ***  dinner: *** snacks: *** drinks: *** -Current exercise: *** -Educated on {CCM DM COUNSELING:25123} -Counseled to check feet daily and get yearly eye exams -{CCMPHARMDINTERVENTION:25122}  Chronic Kidney Disease Stage 3b  -All medications assessed for renal dosing and appropriateness in chronic kidney disease. -{CCMPHARMDINTERVENTION:25122}   Follow Up Plan: {CM FOLLOW UP PLAN:22241}    ***

## 2023-04-04 ENCOUNTER — Telehealth: Payer: Self-pay

## 2023-04-04 MED ORDER — PRALUENT 150 MG/ML ~~LOC~~ SOAJ
1.0000 mL | SUBCUTANEOUS | 1 refills | Status: DC
Start: 2023-04-04 — End: 2023-07-29

## 2023-04-04 MED ORDER — RYBELSUS 14 MG PO TABS: 1.0000 | ORAL_TABLET | Freq: Every day | ORAL | 0 refills | Status: DC

## 2023-04-04 MED ORDER — DAPAGLIFLOZIN PROPANEDIOL 10 MG PO TABS
10.0000 mg | ORAL_TABLET | Freq: Every day | ORAL | 3 refills | Status: DC
Start: 2023-04-04 — End: 2024-03-18

## 2023-04-04 NOTE — Progress Notes (Signed)
Per Clinical pharmacist, Can you call [facility] and provide them the voucher info and ensure patient does not have a copay? You can also let them know to place her Andrea Randolph on hold since she won't need that filled through them anymore. then if you can let her know that would be great!    [facility] inform of  [facility] information, and confirm she will not have a co-pay for Praluent. Patient Andrea Randolph was also place on hold.  Everlean Cherry Clinical Pharmacist Assistant [phone]

## 2023-04-15 DIAGNOSIS — E119 Type 2 diabetes mellitus without complications: Secondary | ICD-10-CM | POA: Diagnosis not present

## 2023-04-15 DIAGNOSIS — H524 Presbyopia: Secondary | ICD-10-CM | POA: Diagnosis not present

## 2023-04-15 DIAGNOSIS — H5213 Myopia, bilateral: Secondary | ICD-10-CM | POA: Diagnosis not present

## 2023-04-15 DIAGNOSIS — H2513 Age-related nuclear cataract, bilateral: Secondary | ICD-10-CM | POA: Diagnosis not present

## 2023-04-15 LAB — HM DIABETES EYE EXAM

## 2023-04-18 DIAGNOSIS — M5416 Radiculopathy, lumbar region: Secondary | ICD-10-CM | POA: Diagnosis not present

## 2023-04-18 DIAGNOSIS — M5136 Other intervertebral disc degeneration, lumbar region: Secondary | ICD-10-CM | POA: Diagnosis not present

## 2023-04-25 DIAGNOSIS — M5136 Other intervertebral disc degeneration, lumbar region: Secondary | ICD-10-CM | POA: Diagnosis not present

## 2023-04-25 DIAGNOSIS — M5416 Radiculopathy, lumbar region: Secondary | ICD-10-CM | POA: Diagnosis not present

## 2023-04-29 ENCOUNTER — Encounter: Payer: Self-pay | Admitting: Family Medicine

## 2023-05-06 ENCOUNTER — Other Ambulatory Visit: Payer: Self-pay | Admitting: Pharmacist

## 2023-05-06 ENCOUNTER — Encounter: Payer: Self-pay | Admitting: Pharmacist

## 2023-05-06 ENCOUNTER — Encounter: Payer: Self-pay | Admitting: Family Medicine

## 2023-05-06 NOTE — Patient Instructions (Signed)
Goals Addressed             This Visit's Progress    Pharmacy Goals       Our goal A1c is less than 7%. This corresponds with fasting sugars less than 130 and 2 hour after meal sugars less than 180. Please keep a log of your results when checking your blood sugar    If you need to get in touch with patient assistance programs regarding refills, you can contact: [employer] at [phone] regarding refills of Rybelsus - [employer] at [phone] regarding refills of Farxiga       Thank you!   Estelle Grumbles, PharmD, [facility] [phone]

## 2023-05-06 NOTE — Progress Notes (Signed)
   05/06/2023  Patient ID: [name], female   DOB: [birth year], 67 y.o.   MRN: [id number]  Received message from PCP requesting outreach as patient has not yet received her Marcelline Deist from [employer] patient assistance program. Note patient previously worked with Hipolito Bayley for enrollment in [place] patient assistance program, but patient is currently out of her [place]. Note office has samples for patient.   Today outreach to [employer] patient assistance program on behalf of program and speak with representative Arline Asp and representative Toni Amend with [facility] (dispensing pharmacy for [employer]). Patient currently enrolled in assistance program for Farxiga through 11/12/2023. Program has an active prescription for Farxiga 10 mg daily, but has not yet been processed. Request order be processed today. Representative states medication will be shipped to patient's home within 10-14 business days.    Outreach to patient today by telephone to provide this update. Patient verbalizes understanding   Reports plans to pick up Farxiga samples from office tomorrow.     Plan   1) Patient denies further medication questions or concerns today   2) Patient to contact office or clinical pharmacist if does not receive supply of Farxiga before runs out of current samples to see if further samples or supply available   4) Clinical Pharmacist will outreach to patient by telephone on 09/18/2023 at 10:00 AM for medication assistance re-enrollment   Estelle Grumbles, PharmD, [facility] [phone]

## 2023-05-09 ENCOUNTER — Ambulatory Visit
Admission: RE | Admit: 2023-05-09 | Discharge: 2023-05-09 | Disposition: A | Discharge status: Home / Self Care | Payer: Medicare HMO | Source: Ambulatory Visit | Attending: Radiation Oncology | Admitting: Radiation Oncology

## 2023-05-09 ENCOUNTER — Encounter: Payer: Self-pay | Admitting: Radiation Oncology

## 2023-05-09 VITALS — BP 146/89 | HR 83 | Temp 97.0°F | Resp 12 | Wt 220.0 lb

## 2023-05-09 DIAGNOSIS — C50211 Malignant neoplasm of upper-inner quadrant of right female breast: Secondary | ICD-10-CM | POA: Diagnosis not present

## 2023-05-09 DIAGNOSIS — Z170 Estrogen receptor positive status [ER+]: Secondary | ICD-10-CM | POA: Insufficient documentation

## 2023-05-09 DIAGNOSIS — Z79811 Long term (current) use of aromatase inhibitors: Secondary | ICD-10-CM | POA: Diagnosis not present

## 2023-05-09 DIAGNOSIS — Z923 Personal history of irradiation: Secondary | ICD-10-CM | POA: Insufficient documentation

## 2023-05-09 NOTE — Progress Notes (Signed)
Radiation Oncology Follow up Note  Name: Andrea Randolph   Date:   05/09/2023 MRN:  [id number] DOB: [birth year]    This 67 y.o. female presents to the clinic today for 2-year follow-up status post whole breast radiation to her right breast for stage Ia ER/PR positive invasive mammary carcinoma.  REFERRING PROVIDER: Alba Cory, MD  HPI: Patient is a 67 year old female now out over 2 years having completed whole breast radiation to her right breast for stage Ia ER/PR positive invasive mammary carcinoma she is seen today in routine follow-up is doing well specifically denies breast tenderness cough or bone pain..  She had mammograms back in March which were BI-RADS 2 benign.  She is currently on Aromasin.  COMPLICATIONS OF TREATMENT: none  FOLLOW UP COMPLIANCE: keeps appointments   PHYSICAL EXAM:  BP (!) 146/89   Pulse 83   Temp (!) 97 F (36.1 C) (Tympanic)   Resp 12   Wt 220 lb (99.8 kg)   BMI 37.76 kg/m  Lungs are clear to A&P cardiac examination essentially unremarkable with regular rate and rhythm. No dominant mass or nodularity is noted in either breast in 2 positions examined. Incision is well-healed. No axillary or supraclavicular adenopathy is appreciated. Cosmetic result is excellent.  Well-developed well-nourished patient in NAD. HEENT reveals PERLA, EOMI, discs not visualized.  Oral cavity is clear. No oral mucosal lesions are identified. Neck is clear without evidence of cervical or supraclavicular adenopathy. Lungs are clear to A&P. Cardiac examination is essentially unremarkable with regular rate and rhythm without murmur rub or thrill. Abdomen is benign with no organomegaly or masses noted. Motor sensory and DTR levels are equal and symmetric in the upper and lower extremities. Cranial nerves II through XII are grossly intact. Proprioception is intact. No peripheral adenopathy or edema is identified. No motor or sensory levels are noted. Crude visual fields are within normal  range.  RADIOLOGY RESULTS: Mammograms reviewed compatible with above-stated findings  PLAN: Present time patient is at 2 years with no evidence of disease.  She is doing well.  I have asked to see her back in 1 year and then will discontinue follow-up care.  Patient is to call with any concerns.  I would like to take this opportunity to thank you for allowing me to participate in the care of your patient.Carmina Miller, MD

## 2023-05-20 ENCOUNTER — Ambulatory Visit
Admission: RE | Admit: 2023-05-20 | Discharge: 2023-05-20 | Disposition: A | Discharge status: Home / Self Care | Payer: Medicare HMO | Source: Ambulatory Visit | Attending: Family Medicine | Admitting: Family Medicine

## 2023-05-20 DIAGNOSIS — Z780 Asymptomatic menopausal state: Secondary | ICD-10-CM | POA: Insufficient documentation

## 2023-05-20 DIAGNOSIS — Z170 Estrogen receptor positive status [ER+]: Secondary | ICD-10-CM | POA: Diagnosis not present

## 2023-05-20 DIAGNOSIS — E119 Type 2 diabetes mellitus without complications: Secondary | ICD-10-CM | POA: Diagnosis not present

## 2023-05-20 DIAGNOSIS — E559 Vitamin D deficiency, unspecified: Secondary | ICD-10-CM | POA: Insufficient documentation

## 2023-05-20 DIAGNOSIS — Z13820 Encounter for screening for osteoporosis: Secondary | ICD-10-CM | POA: Diagnosis not present

## 2023-05-20 DIAGNOSIS — Z923 Personal history of irradiation: Secondary | ICD-10-CM | POA: Diagnosis not present

## 2023-05-20 DIAGNOSIS — C50211 Malignant neoplasm of upper-inner quadrant of right female breast: Secondary | ICD-10-CM | POA: Insufficient documentation

## 2023-05-22 IMAGING — MR MR LUMBAR SPINE W/O CM
5 series · 31 of 48 positions shown · non-contrast
Comparison: None.

CLINICAL DATA: Chronic low back pain radiating down the right leg.

EXAM:
MRI LUMBAR SPINE WITHOUT CONTRAST
TECHNIQUE: Multiplanar, multisequence MR imaging of the lumbar spine was
performed. No intravenous contrast was administered.

[Series 5: T2 · sagittal · 4.0mm · 0.81mm/px · 6 of 17 slices shown (1 of 2)]
[im 1/17]
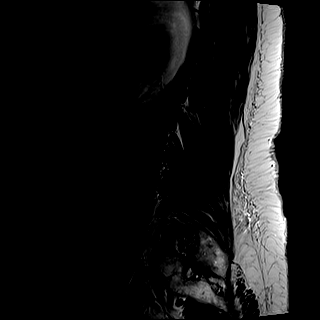
[im 4/17]
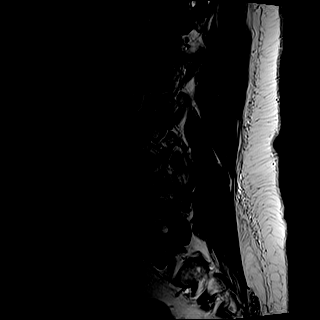
[im 7/17]
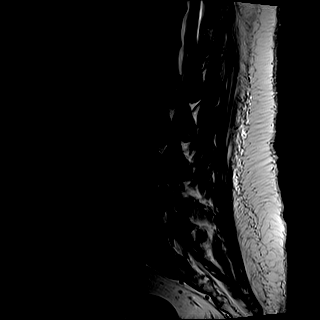
[im 10/17]
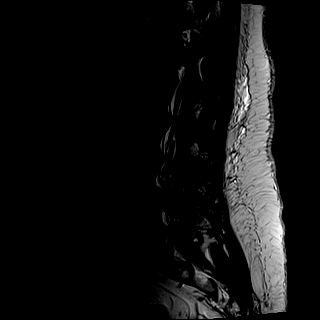
[im 13/17]
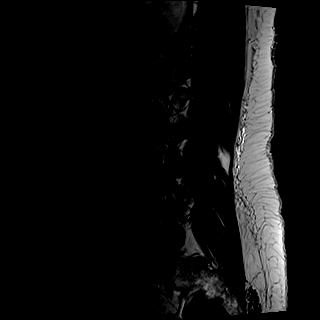
[im 17/17]
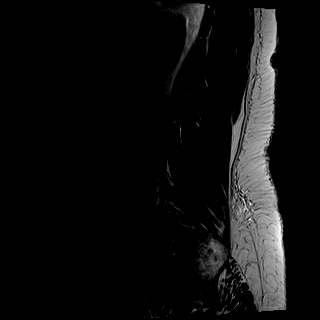

[Series 6: T1 · sagittal · 4.0mm · 0.81mm/px · 7 of 17 slices shown (1 of 2)]
[im 1/17]
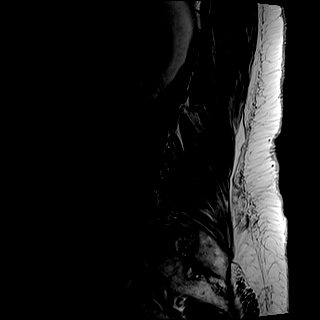
[im 3/17]
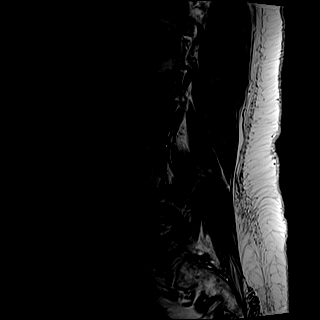
[im 6/17]
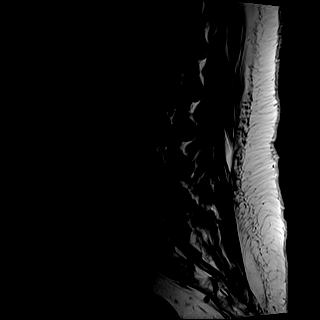
[im 9/17]
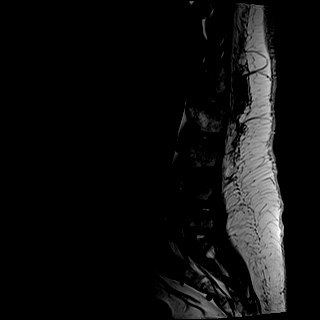
[im 11/17]
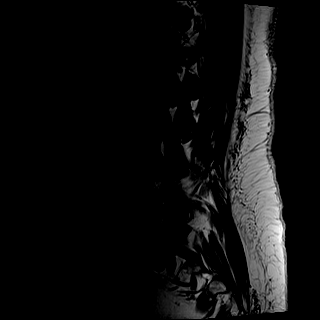
[im 14/17]
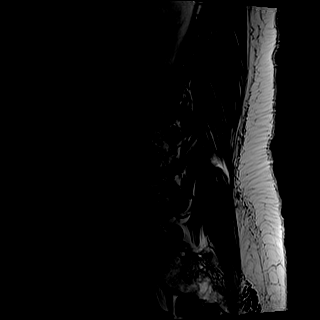
[im 17/17]
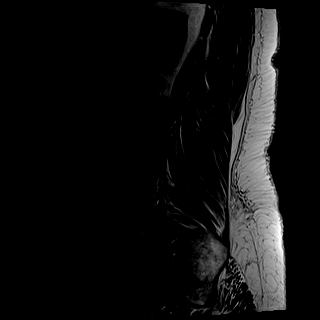

[Series 7: STIR · sagittal · 4.0mm · 0.41mm/px · 2 of 17 slices shown]
[im 1/17]
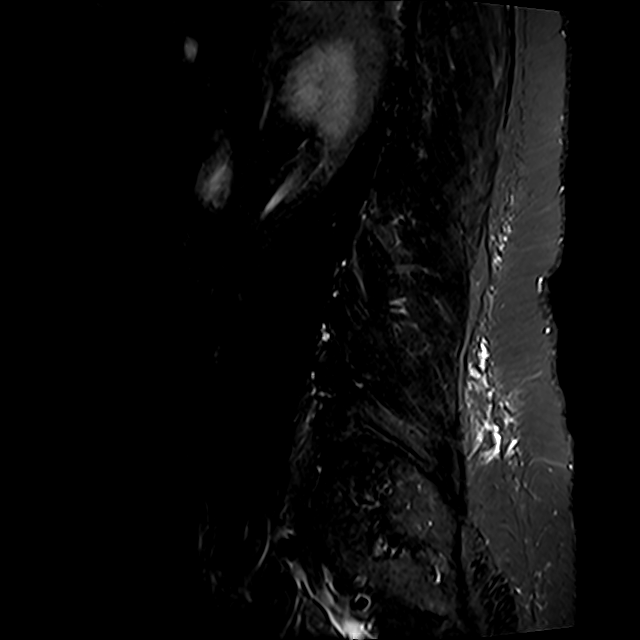
[im 3/17]
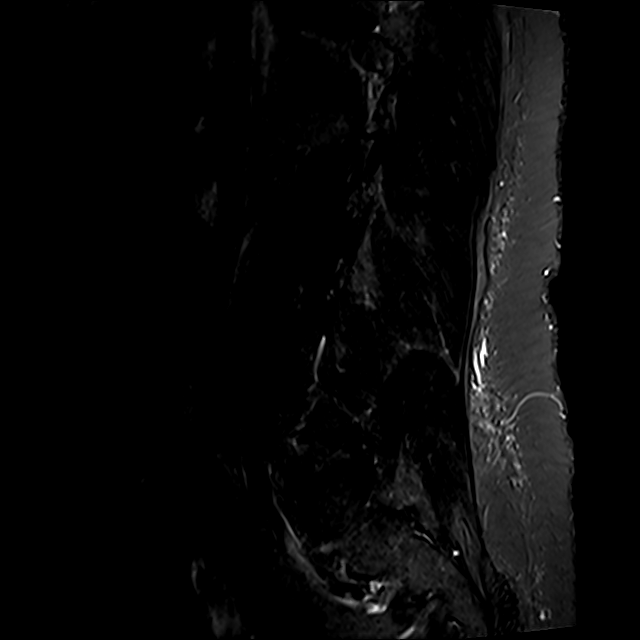

[Series 8: T2 · axial · 4.0mm · 0.78mm/px · z∈[-113,+97]mm · 8 of 36 slices shown (2 of 2)]
[im 1/36]
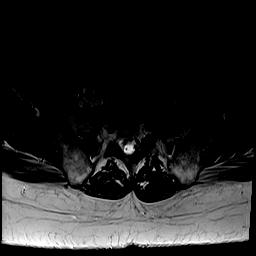
[im 6/36]
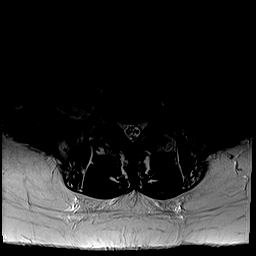
[im 11/36]
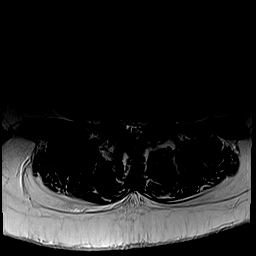
[im 17/36]
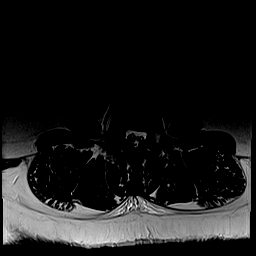
[im 19/36]
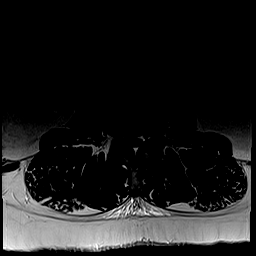
[im 25/36]
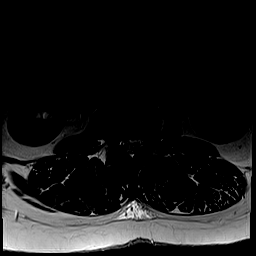
[im 30/36]
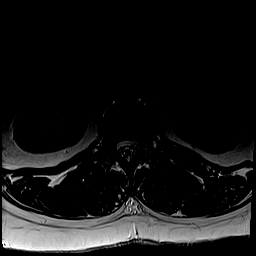
[im 36/36]
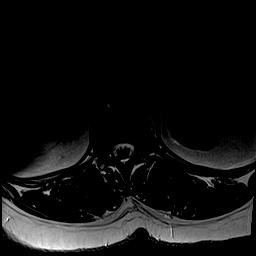

[Series 9: T1 · axial · 4.0mm · 0.39mm/px · z∈[-113,+97]mm · 8 of 36 slices shown (2 of 2)]
[im 1/36]
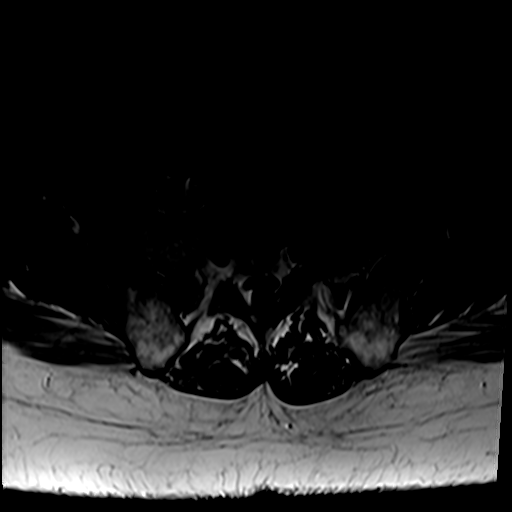
[im 6/36]
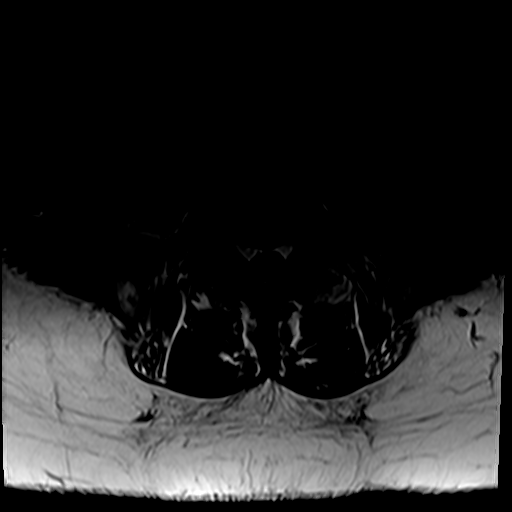
[im 11/36]
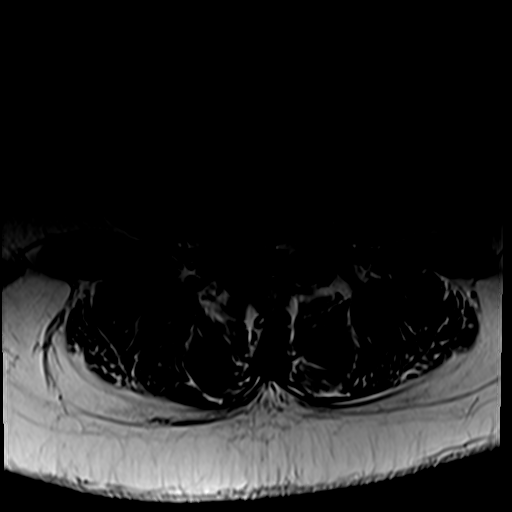
[im 17/36]
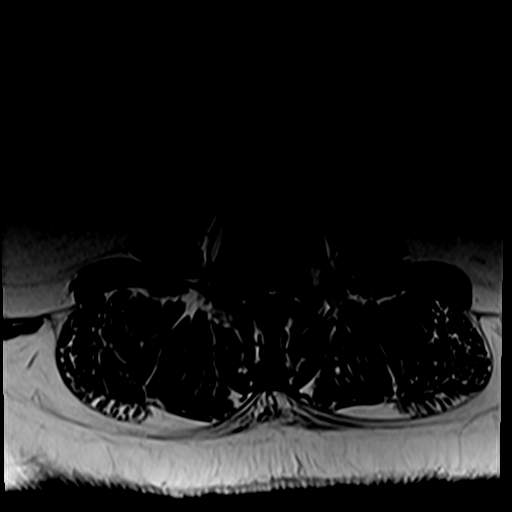
[im 19/36]
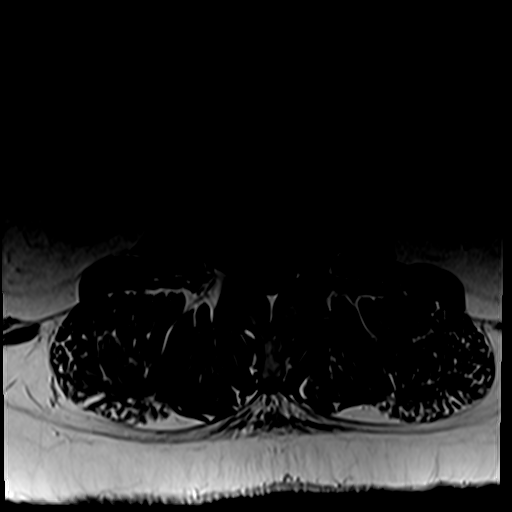
[im 25/36]
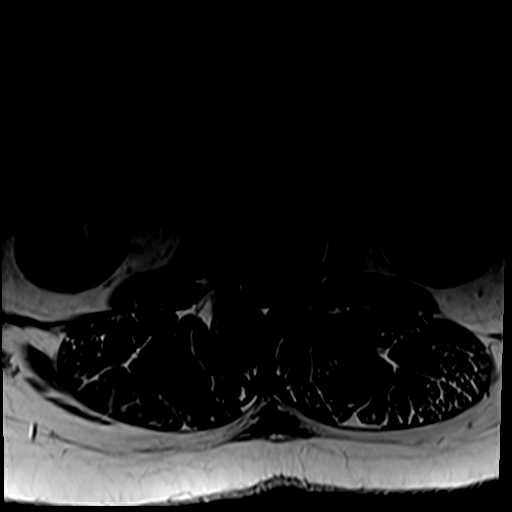
[im 30/36]
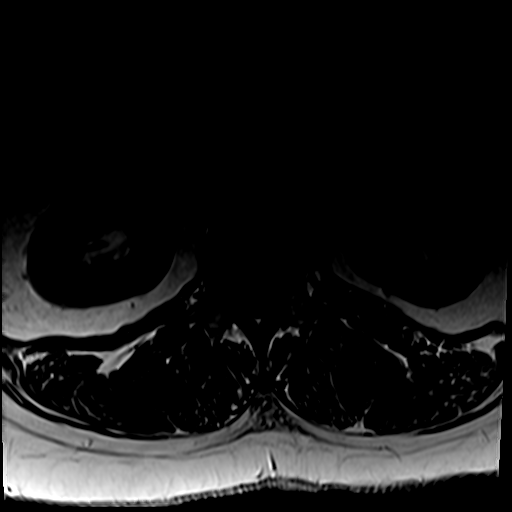
[im 36/36]
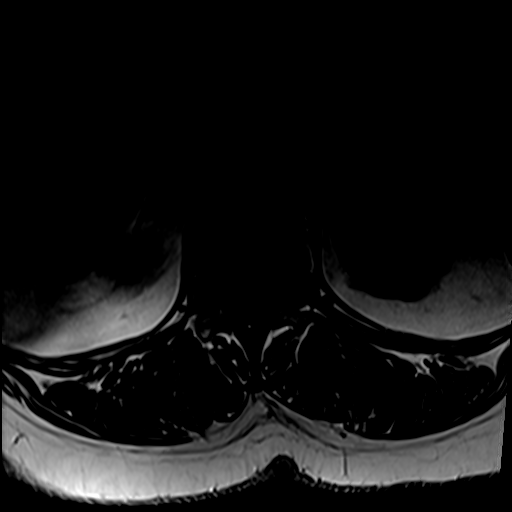

[31 of 48 positions shown; findings below may reference images not displayed]

FINDINGS: Segmentation:  Standard.

Alignment:  Grade 1 anterolisthesis of L4 on L5.

Vertebrae: No acute fracture, evidence of discitis, or aggressive
bone lesion.

Conus medullaris and cauda equina: Conus extends to the T12-L1
level. Conus and cauda equina appear normal.

Paraspinal and other soft tissues: No acute paraspinal abnormality.

Disc levels:

Disc spaces: Degenerative disease with disc desiccation and disc
height loss throughout the lumbar spine.

T12-L1: Mild broad-based disc bulge. No foraminal or central canal
stenosis.

L1-L2: Broad-based disc bulge with a broad central/right paracentral
disc protrusion. Right subarticular recess stenosis. Moderate spinal
stenosis. Mild bilateral facet arthropathy. No foraminal stenosis.

L2-L3: Broad-based disc bulge flattening the ventral thecal sac.
Mild bilateral facet arthropathy. No foraminal central canal
stenosis.

L3-L4: Broad-based disc bulge flattening the ventral thecal sac.
Mild bilateral facet arthropathy. Moderate spinal stenosis. No
foraminal stenosis.

L4-L5: Grade 1 anterolisthesis with uncovering of the disc.
Broad-based disc bulge. Severe bilateral facet arthropathy. Severe
spinal stenosis. Severe right and mild left foraminal stenosis.

L5-S1: Broad-based disc bulge. Moderate right and mild left facet
arthropathy. Mild bilateral foraminal stenosis.
IMPRESSION: 1. At L4-5 there is grade 1 anterolisthesis with uncovering of the
disc. Broad-based disc bulge. Severe bilateral facet arthropathy.
Severe spinal stenosis. Severe right and mild left foraminal
stenosis.
2. At L1-2 there is a broad-based disc bulge with a broad
central/right paracentral disc protrusion. Right subarticular recess
stenosis. Moderate spinal stenosis. Mild bilateral facet
arthropathy.

## 2023-05-22 IMAGING — MG DIGITAL DIAGNOSTIC BILAT W/ TOMO W/ CAD
6 of 9 series · 6 of 25 positions shown · non-contrast
Comparison: Previous exam(s).

CLINICAL DATA: 65-year-old female for annual follow-up. History of
RIGHT breast cancer and lumpectomy in 6866.

EXAM:
DIGITAL DIAGNOSTIC BILATERAL MAMMOGRAM WITH TOMOSYNTHESIS AND CAD
TECHNIQUE: Bilateral digital diagnostic mammography and breast tomosynthesis
was performed. The images were evaluated with computer-aided
detection.

[R MLO]
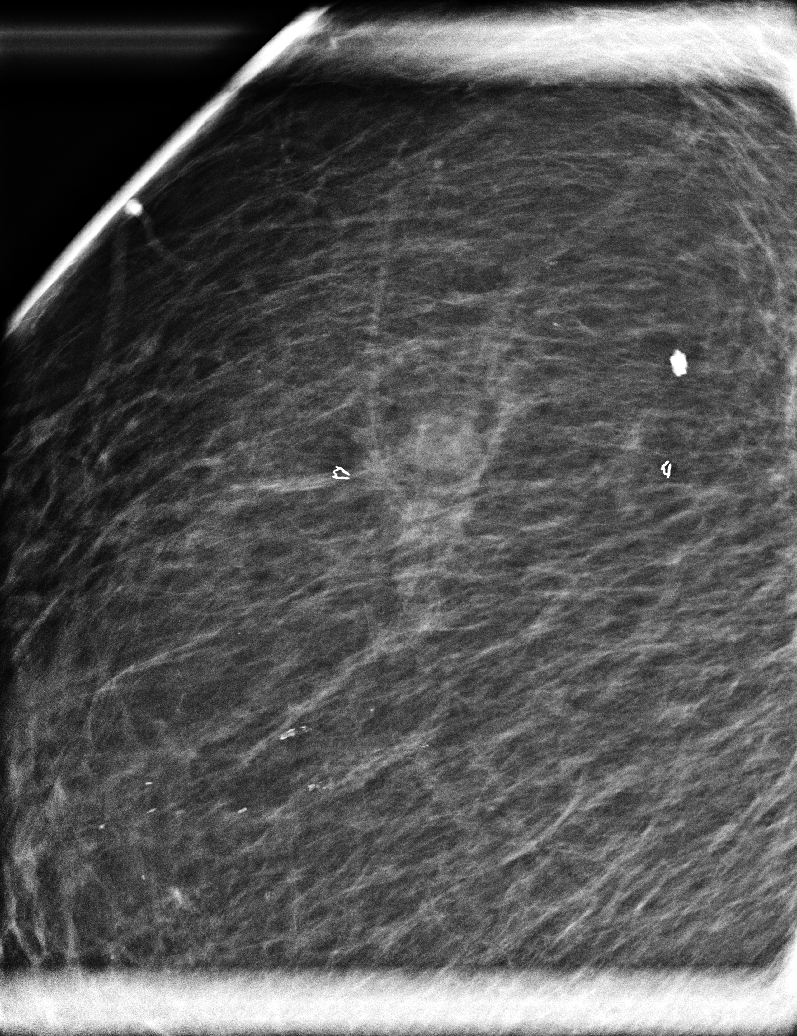

[L CC synth-2D]
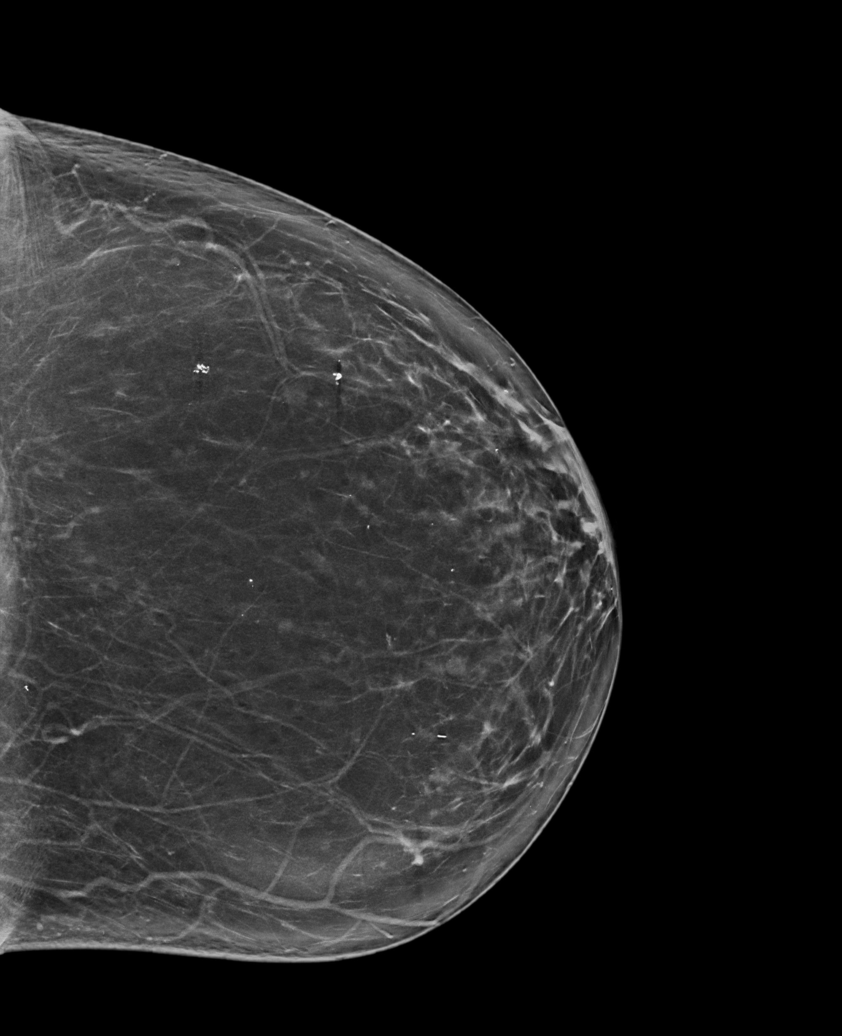

[R MLO synth-2D]
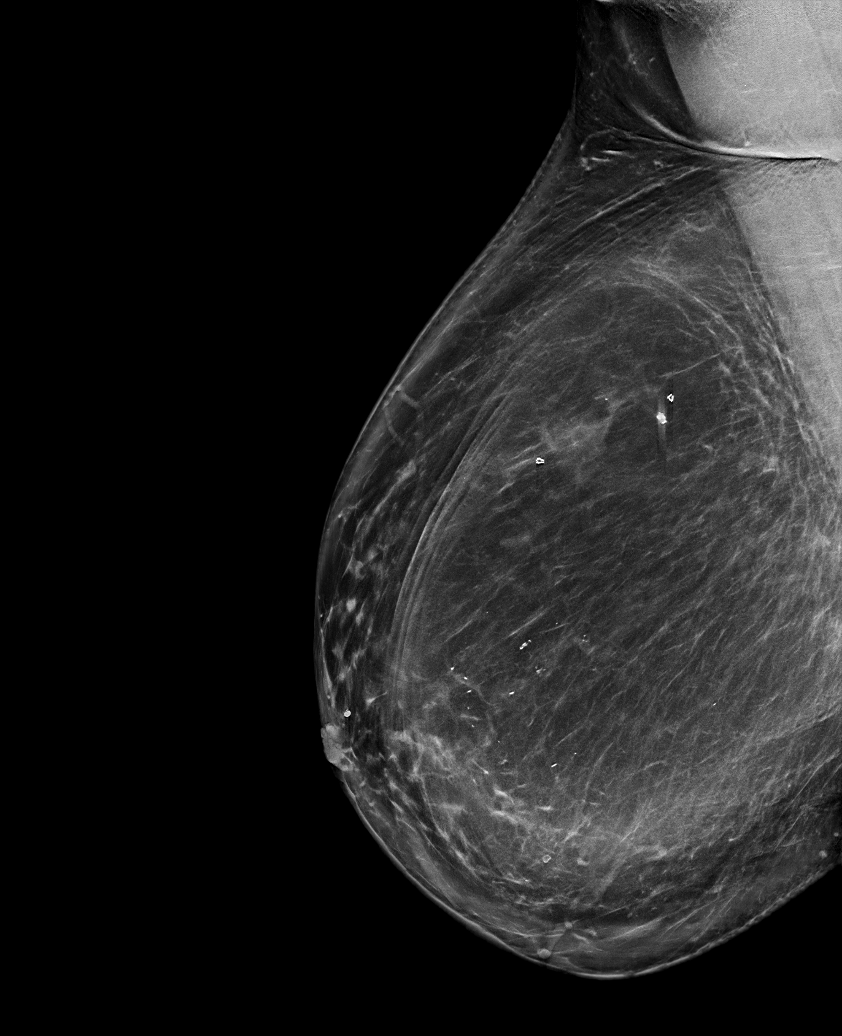

[L MLO synth-2D]
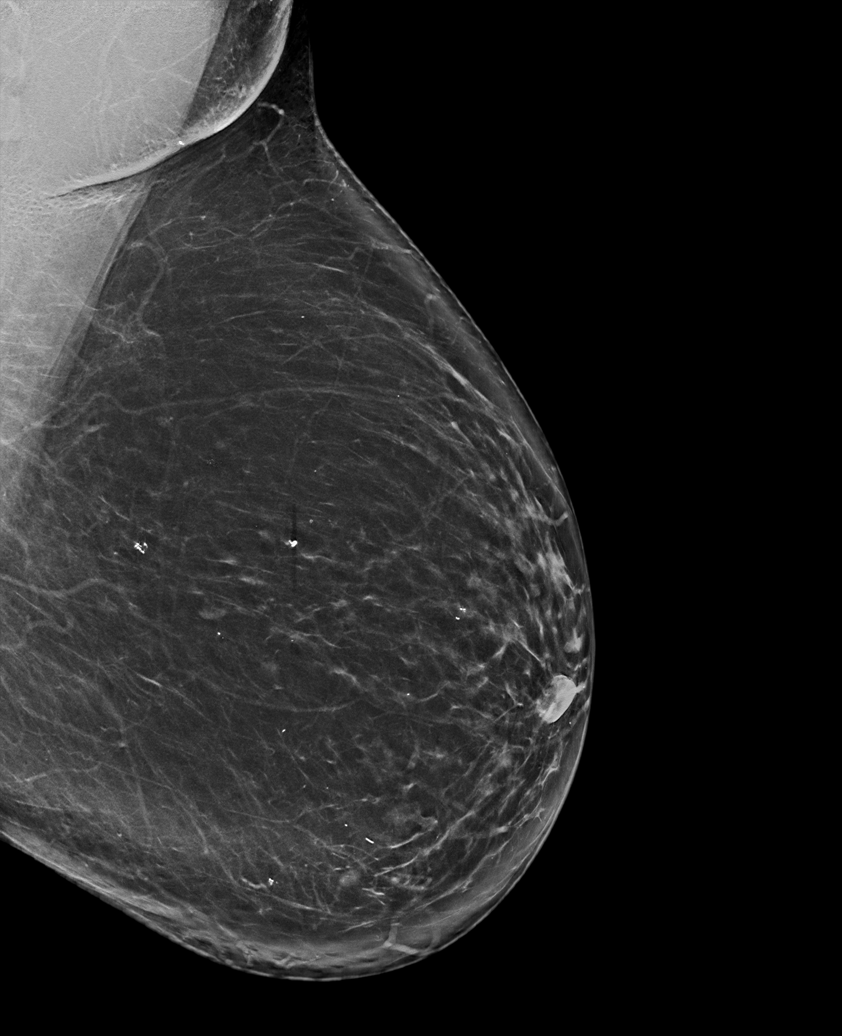

[R CC synth-2D]
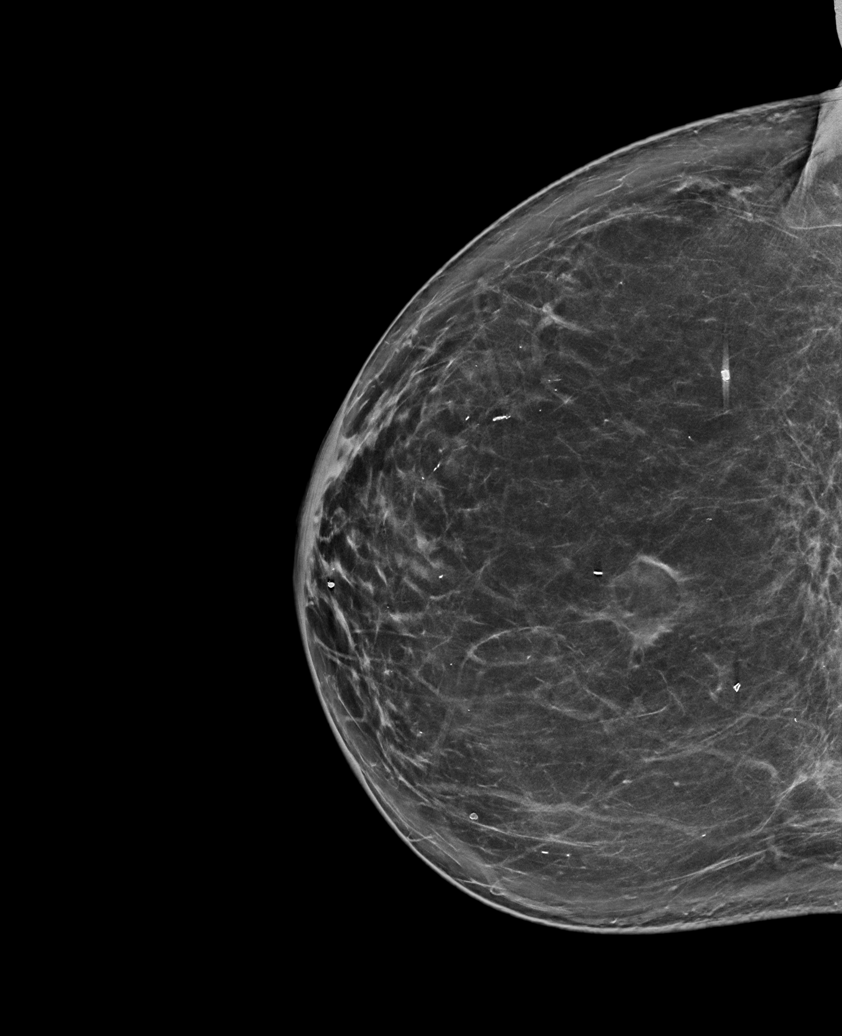

[L CC tomo · tomo slice 43/86.0]
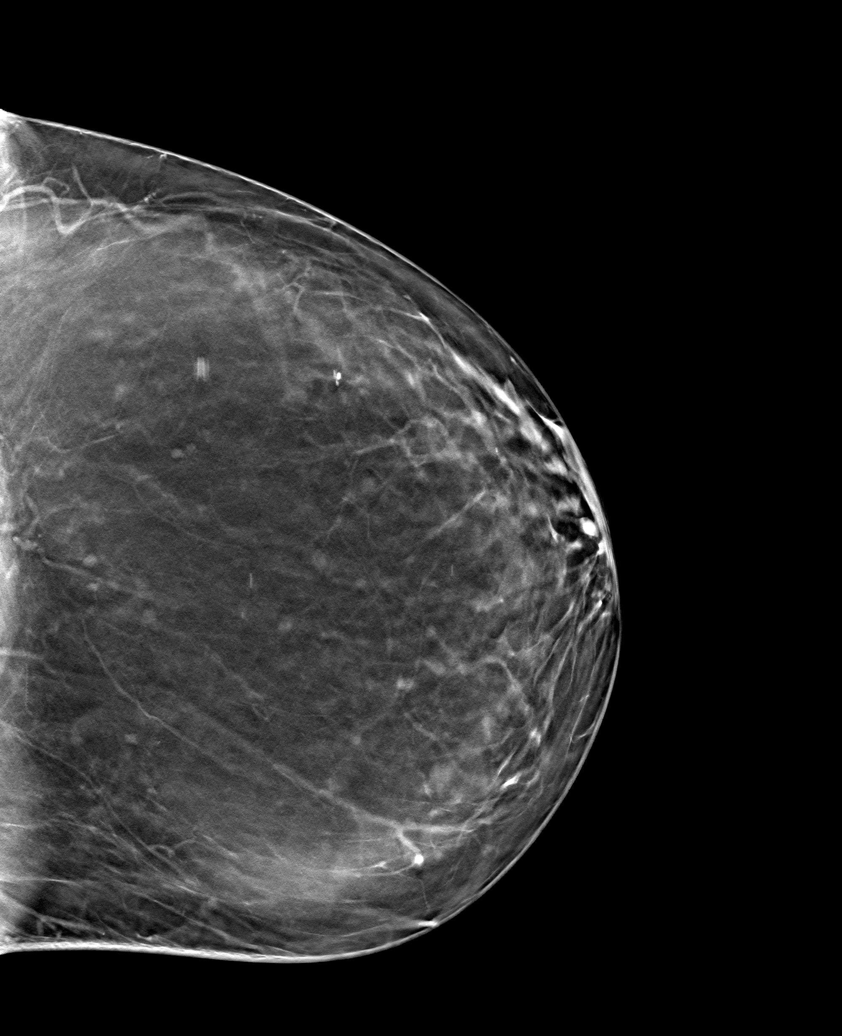

[6 of 25 positions shown; findings below may reference images not displayed]

ACR Breast Density Category b: There are scattered areas of
fibroglandular density.
FINDINGS: 2D and 3D full field views of both breasts and a magnification view
of the lumpectomy site demonstrate no suspicious mass, nonsurgical
distortion or worrisome calcifications.

RIGHT lumpectomy changes are noted.
IMPRESSION: 1. No evidence of breast malignancy.
2. RIGHT lumpectomy changes.

RECOMMENDATION:
Bilateral diagnostic mammogram in 1 year.

I have discussed the findings and recommendations with the patient.
If applicable, a reminder letter will be sent to the patient
regarding the next appointment.

BI-RADS CATEGORY  2: Benign.

## 2023-05-29 DIAGNOSIS — E785 Hyperlipidemia, unspecified: Secondary | ICD-10-CM | POA: Diagnosis not present

## 2023-05-29 DIAGNOSIS — M65341 Trigger finger, right ring finger: Secondary | ICD-10-CM | POA: Diagnosis not present

## 2023-05-29 DIAGNOSIS — E1169 Type 2 diabetes mellitus with other specified complication: Secondary | ICD-10-CM | POA: Diagnosis not present

## 2023-06-10 DIAGNOSIS — M65341 Trigger finger, right ring finger: Secondary | ICD-10-CM | POA: Diagnosis not present

## 2023-06-10 HISTORY — PX: TRIGGER FINGER RELEASE: SHX641

## 2023-07-10 ENCOUNTER — Other Ambulatory Visit: Payer: Self-pay | Admitting: Family Medicine

## 2023-07-10 ENCOUNTER — Ambulatory Visit: Payer: Self-pay

## 2023-07-10 DIAGNOSIS — E890 Postprocedural hypothyroidism: Secondary | ICD-10-CM

## 2023-07-10 NOTE — Telephone Encounter (Signed)
  Patient said her left leg has been numb since yesterday. Please f/u with patient     Chief Complaint: Numbness, pain left thigh to knee. Comes and goes. "I have had back problems." Symptoms: Above Frequency: Yesterday Pertinent Negatives: Patient denies any other symptoms Disposition: [] ED /[] Urgent Care (no appt availability in office) / [x] Appointment(In office/virtual)/ []  [facility]/ [] Home Care/ [] Refused Recommended Disposition /[] [facility]/ []  Follow-up with PCP Additional Notes: Pt. Agrees with appointment. Instructed to go to ED for worsening of symptoms.  Reason for Disposition  [1] Numbness or tingling in one or both feet AND [2] is a chronic symptom (recurrent or ongoing AND present > 4 weeks)  Answer Assessment - Initial Assessment Questions 1. SYMPTOM: "What is the main symptom you are concerned about?" (e.g., weakness, numbness)     Numbness, pain started in hip 2. ONSET: "When did this start?" (minutes, hours, days; while sleeping)     Yesterday 3. LAST NORMAL: "When was the last time you (the patient) were normal (no symptoms)?"     This week 4. PATTERN "Does this come and go, or has it been constant since it started?"  "Is it present now?"     Comes and goes 5. CARDIAC SYMPTOMS: "Have you had any of the following symptoms: chest pain, difficulty breathing, palpitations?"     No 6. NEUROLOGIC SYMPTOMS: "Have you had any of the following symptoms: headache, dizziness, vision loss, double vision, changes in speech, unsteady on your feet?"     No 7. OTHER SYMPTOMS: "Do you have any other symptoms?"     No 8. PREGNANCY: "Is there any chance you are pregnant?" "When was your last menstrual period?"     No  Protocols used: Neurologic Deficit-A-AH

## 2023-07-11 ENCOUNTER — Encounter: Payer: Self-pay | Admitting: Nurse Practitioner

## 2023-07-11 ENCOUNTER — Other Ambulatory Visit: Payer: Self-pay

## 2023-07-11 ENCOUNTER — Ambulatory Visit (INDEPENDENT_AMBULATORY_CARE_PROVIDER_SITE_OTHER): Payer: Medicare HMO | Admitting: Nurse Practitioner

## 2023-07-11 VITALS — BP 128/76 | HR 82 | Temp 98.4°F | Resp 16 | Ht 64.0 in | Wt 213.9 lb

## 2023-07-11 DIAGNOSIS — N1831 Chronic kidney disease, stage 3a: Secondary | ICD-10-CM | POA: Diagnosis not present

## 2023-07-11 DIAGNOSIS — E1169 Type 2 diabetes mellitus with other specified complication: Secondary | ICD-10-CM | POA: Diagnosis not present

## 2023-07-11 DIAGNOSIS — G629 Polyneuropathy, unspecified: Secondary | ICD-10-CM

## 2023-07-11 DIAGNOSIS — E559 Vitamin D deficiency, unspecified: Secondary | ICD-10-CM | POA: Diagnosis not present

## 2023-07-11 DIAGNOSIS — M5442 Lumbago with sciatica, left side: Secondary | ICD-10-CM

## 2023-07-11 DIAGNOSIS — G8929 Other chronic pain: Secondary | ICD-10-CM | POA: Diagnosis not present

## 2023-07-11 DIAGNOSIS — E1122 Type 2 diabetes mellitus with diabetic chronic kidney disease: Secondary | ICD-10-CM

## 2023-07-11 DIAGNOSIS — E785 Hyperlipidemia, unspecified: Secondary | ICD-10-CM | POA: Diagnosis not present

## 2023-07-11 DIAGNOSIS — I10 Essential (primary) hypertension: Secondary | ICD-10-CM | POA: Diagnosis not present

## 2023-07-11 MED ORDER — TRAMADOL HCL 50 MG PO TABS
50.0000 mg | ORAL_TABLET | Freq: Three times a day (TID) | ORAL | 0 refills | Status: AC | PRN
Start: 2023-07-11 — End: 2023-07-16

## 2023-07-11 MED ORDER — CYCLOBENZAPRINE HCL 5 MG PO TABS
10.0000 mg | ORAL_TABLET | Freq: Three times a day (TID) | ORAL | 0 refills | Status: DC | PRN
Start: 2023-07-11 — End: 2023-09-18

## 2023-07-11 MED ORDER — PREDNISONE 10 MG (21) PO TBPK
ORAL_TABLET | ORAL | 0 refills | Status: DC
Start: 2023-07-11 — End: 2023-07-29

## 2023-07-11 NOTE — Assessment & Plan Note (Signed)
labs ordered

## 2023-07-11 NOTE — Assessment & Plan Note (Signed)
Will start tramadol for acute pain, steroid taper and muscle relaxer. Recommend she follow up with ortho.  Can also do heat therapy

## 2023-07-11 NOTE — Assessment & Plan Note (Signed)
B/p at goal,  labs ordered

## 2023-07-11 NOTE — Progress Notes (Signed)
BP 128/76   Pulse 82   Temp 98.4 F (36.9 C) (Oral)   Resp 16   Ht 5\' 4"  (1.626 m)   Wt 213 lb 14.4 oz (97 kg)   SpO2 96%   BMI 36.72 kg/m    Subjective:    Patient ID: Andrea Randolph, female    DOB: Mar 29, 1956, 67 y.o.   MRN: [id number]  HPI: Andrea Randolph is a 67 y.o. female  Chief Complaint  Patient presents with   Leg Pain    Left leg pain for 3 days   Low back and left leg pain: she reports on Saturday she was flipping her mattress and her back started to hurt.  She took ibuprofen, it helped a little. On Tuesday she was lifting boxes and the pain returned and is now going down her left leg.  She reports that her left leg does feel numb. She says that this has happened before.she says she has previously had back injections.  She was seen by Dr. Merri Ray at [facility].  Recommend patient follow back up with him.  Provided number to contact. Kidney function was down last time she had labs done, so will give tramadol for pain. Also sent in steroid taper and muscle relaxer,  referral placed to ortho, to same provider she saw before.    Patient would like to have her labs done while she is here.  Will get labs. She has appointment with Dr. Carlynn Purl on 07/29/2023.  Relevant past medical, surgical, family and social history reviewed and updated as indicated. Interim medical history since our last visit reviewed. Allergies and medications reviewed and updated.  Review of Systems  Constitutional: Negative for fever or weight change.  Respiratory: Negative for cough and shortness of breath.   Cardiovascular: Negative for chest pain or palpitations.  Gastrointestinal: Negative for abdominal pain, no bowel changes.  Musculoskeletal: Negative for gait problem or joint swelling.  Skin: Negative for rash.  Neurological: Negative for dizziness or headache.  No other specific complaints in a complete review of systems (except as listed in HPI above).      Objective:    BP  128/76   Pulse 82   Temp 98.4 F (36.9 C) (Oral)   Resp 16   Ht 5\' 4"  (1.626 m)   Wt 213 lb 14.4 oz (97 kg)   SpO2 96%   BMI 36.72 kg/m   Wt Readings from Last 3 Encounters:  07/11/23 213 lb 14.4 oz (97 kg)  05/09/23 220 lb (99.8 kg)  04/03/23 218 lb 8 oz (99.1 kg)    Physical Exam  Constitutional: Patient appears well-developed and well-nourished. Obese  No distress.  HEENT: head atraumatic, normocephalic, pupils equal and reactive to light, neck supple Cardiovascular: Normal rate, regular rhythm and normal heart sounds.  No murmur heard. No BLE edema. Pulmonary/Chest: Effort normal and breath sounds normal. No respiratory distress. Abdominal: Soft.  There is no tenderness. Psychiatric: Patient has a normal mood and affect. behavior is normal. Judgment and thought content normal.  Results for orders placed or performed in visit on 04/18/23  HM DIABETES EYE EXAM  Result Value Ref Range   HM Diabetic Eye Exam No Retinopathy No Retinopathy      Assessment & Plan:   Problem List Items Addressed This Visit       Cardiovascular and Mediastinum   Benign essential HTN    B/p at goal,  labs ordered      Relevant Orders  CBC with Differential/Platelet   COMPLETE METABOLIC PANEL WITH GFR     Endocrine   Diabetes mellitus with renal manifestation (HCC)   Relevant Orders   Hemoglobin A1c     Nervous and Auditory   Chronic bilateral low back pain with left-sided sciatica - Primary    Will start tramadol for acute pain, steroid taper and muscle relaxer. Recommend she follow up with ortho.  Can also do heat therapy      Relevant Medications   predniSONE (STERAPRED UNI-PAK 21 TAB) 10 MG (21) TBPK tablet   cyclobenzaprine (FLEXERIL) 5 MG tablet   traMADol (ULTRAM) 50 MG tablet   Other Relevant Orders   Ambulatory referral to Orthopedic Surgery     Other   Vitamin D deficiency    labs ordered      Relevant Orders   VITAMIN D 25 Hydroxy (Vit-D Deficiency, Fractures)    Other Visit Diagnoses     Stage 3a chronic kidney disease (HCC)       Relevant Orders   COMPLETE METABOLIC PANEL WITH GFR   Dyslipidemia due to type 2 diabetes mellitus (HCC)       Relevant Orders   Lipid panel   Neuropathy       Relevant Orders   Vitamin B12        Follow up plan: Return for has appointment with Dr. Carlynn Purl scheduled.

## 2023-07-12 LAB — CBC WITH DIFFERENTIAL/PLATELET
Absolute Monocytes: 290 {cells}/uL (ref 200–950)
Basophils Absolute: 29 {cells}/uL (ref 0–200)
Basophils Relative: 0.7 %
Eosinophils Absolute: 80 {cells}/uL (ref 15–500)
Eosinophils Relative: 1.9 %
HCT: 42 % (ref 35.0–45.0)
Hemoglobin: 13.9 g/dL (ref 11.7–15.5)
Lymphs Abs: 1411 {cells}/uL (ref 850–3900)
MCH: 27.4 pg (ref 27.0–33.0)
MCHC: 33.1 g/dL (ref 32.0–36.0)
MCV: 82.7 fL (ref 80.0–100.0)
MPV: 9.8 fL (ref 7.5–12.5)
Monocytes Relative: 6.9 %
Neutro Abs: 2390 {cells}/uL (ref 1500–7800)
Neutrophils Relative %: 56.9 %
Platelets: 380 10*3/uL (ref 140–400)
RBC: 5.08 10*6/uL (ref 3.80–5.10)
RDW: 14.7 % (ref 11.0–15.0)
Total Lymphocyte: 33.6 %
WBC: 4.2 10*3/uL (ref 3.8–10.8)

## 2023-07-12 LAB — COMPLETE METABOLIC PANEL WITHOUT GFR
AG Ratio: 1.4 (calc) (ref 1.0–2.5)
ALT: 13 U/L (ref 6–29)
AST: 15 U/L (ref 10–35)
Albumin: 4.2 g/dL (ref 3.6–5.1)
Alkaline phosphatase (APISO): 51 U/L (ref 37–153)
BUN/Creatinine Ratio: 12 (calc) (ref 6–22)
BUN: 13 mg/dL (ref 7–25)
CO2: 29 mmol/L (ref 20–32)
Calcium: 10 mg/dL (ref 8.6–10.4)
Chloride: 100 mmol/L (ref 98–110)
Creat: 1.12 mg/dL — ABNORMAL HIGH (ref 0.50–1.05)
Globulin: 3.1 g/dL (ref 1.9–3.7)
Glucose, Bld: 109 mg/dL — ABNORMAL HIGH (ref 65–99)
Potassium: 3.9 mmol/L (ref 3.5–5.3)
Sodium: 140 mmol/L (ref 135–146)
Total Bilirubin: 0.5 mg/dL (ref 0.2–1.2)
Total Protein: 7.3 g/dL (ref 6.1–8.1)
eGFR: 55 mL/min/1.73m2 — ABNORMAL LOW

## 2023-07-12 LAB — VITAMIN D 25 HYDROXY (VIT D DEFICIENCY, FRACTURES): Vit D, 25-Hydroxy: 62 ng/mL (ref 30–100)

## 2023-07-12 LAB — LIPID PANEL
Cholesterol: 156 mg/dL
HDL: 55 mg/dL
LDL Cholesterol (Calc): 85 mg/dL
Non-HDL Cholesterol (Calc): 101 mg/dL
Total CHOL/HDL Ratio: 2.8 (calc)
Triglycerides: 72 mg/dL

## 2023-07-12 LAB — HEMOGLOBIN A1C
Hgb A1c MFr Bld: 6.8 %{Hb} — ABNORMAL HIGH
Mean Plasma Glucose: 148 mg/dL
eAG (mmol/L): 8.2 mmol/L

## 2023-07-12 LAB — VITAMIN B12: Vitamin B-12: >2000 pg/mL — ABNORMAL HIGH (ref 200–1100)

## 2023-07-17 DIAGNOSIS — M48062 Spinal stenosis, lumbar region with neurogenic claudication: Secondary | ICD-10-CM | POA: Diagnosis not present

## 2023-07-17 DIAGNOSIS — M5416 Radiculopathy, lumbar region: Secondary | ICD-10-CM | POA: Diagnosis not present

## 2023-07-17 DIAGNOSIS — M5136 Other intervertebral disc degeneration, lumbar region: Secondary | ICD-10-CM | POA: Diagnosis not present

## 2023-07-18 DIAGNOSIS — M48062 Spinal stenosis, lumbar region with neurogenic claudication: Secondary | ICD-10-CM | POA: Diagnosis not present

## 2023-07-18 DIAGNOSIS — M5416 Radiculopathy, lumbar region: Secondary | ICD-10-CM | POA: Diagnosis not present

## 2023-07-26 NOTE — Progress Notes (Unsigned)
Name: Andrea Randolph   MRN: [id number]    DOB: [birth year]   Date:07/29/2023       Progress Note  Subjective  Chief Complaint  Follow Up/ Ortho Referral  HPI  DMII: she is currently taking Farxiga and Rybelsus, off Metformin . Last A1C was 6.4 %, 6.7 %, 6.6 %  up to 7.7 % down to  6.5 % and it was 6.8 %  She has dyslipidemia, HTN , CKI and obesity. She is on ARB, and Zetia started on Praluent since Feb 2023   She denies polyphagia, polydipsia or polyuria. She states statin therapy caused myalgia She has noticed some neuropathy on both heels  taking gabapentin and stable. Cannot tolerate a higher dose of gabapentin   CKI stage III: last GFR was 55 , she has good urine output, no pruritis. She is on ARB and SGL-2 agonist last urine micro was negative   LVH and aortic stenosis: under care of Dr. Mariah Milling, no chest pain, palpitation or sob, she denies orthopnea She is on Farxiga  Mild pulmonary hypertension. Last visit 12/2022. She has not been physically active due to left leg radiculitis   Vitamin D low: she is now taking 2000 units daily, continue supplementation    Surgical hypothyroidism: she has been taking levothyroxine 125 mcg  and last TSH was at goal, last year was elevated because she was not taking by itself. Weight has been stable, no change in bowel movements or dry skin   Morbid obesity: BMI above 35 with co-morbidities. She has DM, HTN, dyslipidemia and OA.  She is on Rybelsus and lost 9 lbs in the past 6 months   Radicular lumbosacral pain: under the care of Dr. Yves Dill , radiating down left leg now , seen by Della Goo in August due to increase in pain, took prednisone taper and had another steroid injection but still has numbness and pain up to 8/10. She will follow up at the end of the month with Dr. Yves Dill, reviewed last MRI may need to see Neurosurgeon .  OA/Knee bilateral: saw Dr. Katrinka Blazing in the past, she states the knee is painful , worse going up and down stairs    Hyperlipidemia: she was Pravastatin  and Zetia . Recently off statin therapy due to muscle aches and elevated CK, LDL was over 180, now on zetia and praluent  and last LDL was down 85 , continue current regiment    OSA: she does not tolerate CPAP machine. Unchanged    Breast Cancer right : diagnosed by abnormal mammogram back in March 22, lumpectomy was done 03/06/2021 followed by 5 weeks of radiation and finished 05/03/2021. She was doing well until she started on Arimidex, she was having body aches,  joint are stiff and aching, difficulty sleeping, change in bowel movement, increase in appetite. She is now off Arimidex and on Aromasin 25 mg but only able to take it every other day , she has lymphedema on right arm but has improved and not wearing a sleeve today   Patient Active Problem List   Diagnosis Date Noted   Chronic bilateral low back pain with left-sided sciatica 07/11/2023   Anemia in stage 3a chronic kidney disease (HCC) 09/06/2022   Genetic testing 03/28/2022   Stage 3b chronic kidney disease (HCC) 03/07/2022   Aromatase inhibitor use 05/30/2021   Complaints of total body pain 05/30/2021   Malignant 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, estrogen receptor positive (HCC) 02/20/2021   Thrombocytosis  11/01/2019   Carpal tunnel syndrome, bilateral 02/19/2019   Trigger finger of right thumb 02/19/2019   Flexor tenosynovitis of finger 02/19/2019   Severe obesity (BMI 35.0-35.9 with comorbidity) (HCC) 12/29/2018   Mild concentric left ventricular hypertrophy (LVH) 12/24/2017   Mild aortic stenosis by prior echocardiogram 12/24/2017   Mild aortic regurgitation 12/24/2017   CKD (chronic kidney disease), stage I 06/22/2016   Uterine fibroid 01/11/2016   Allergic rhinitis, seasonal 05/24/2015   Benign essential HTN 05/24/2015   Chronic constipation 05/24/2015   Diabetes mellitus with renal manifestation (HCC) 05/24/2015   Dyslipidemia 05/24/2015   Post-surgical  hypothyroidism 05/24/2015   Mixed incontinence 05/24/2015   Microalbuminuria 05/24/2015   Menopause 05/24/2015   Obstructive apnea 05/24/2015   Morbid obesity (HCC) 05/24/2015   Hypertensive retinopathy 05/24/2015   Vitamin D deficiency 05/24/2015   Status post total replacement of right shoulder 03/11/2015    Past Surgical History:  Procedure Laterality Date   BREAST BIOPSY Left 90's   benign core needle    BREAST BIOPSY Right 02/07/2021   [place] Bx, Q-clip, DCIS   BREAST LUMPECTOMY Right 03/06/2021   BREAST LUMPECTOMY WITH SENTINEL LYMPH NODE BIOPSY Right 03/06/2021   Procedure: BREAST LUMPECTOMY WITH SENTINEL LYMPH NODE BX;  Surgeon: Earline Mayotte, MD;  Location: [facility] ORS;  Service: General;  Laterality: Right;   COLONOSCOPY WITH PROPOFOL N/A 01/26/2019   Procedure: COLONOSCOPY WITH PROPOFOL;  Surgeon: Toney Reil, MD;  Location: [facility] ENDOSCOPY;  Service: Gastroenterology;  Laterality: N/A;   COLONOSCOPY WITH PROPOFOL N/A 02/09/2022   Procedure: COLONOSCOPY WITH PROPOFOL;  Surgeon: Wyline Mood, MD;  Location: [facility] ENDOSCOPY;  Service: Gastroenterology;  Laterality: N/A;   FOOT SURGERY Bilateral    heel spurs   JOINT REPLACEMENT     TOTAL SHOULDER REPLACEMENT Right 01/25/2015   Dr. Joice Lofts   TOTAL THYROIDECTOMY  08/01/2018    Family History  Problem Relation Age of Onset   Heart disease Mother    Heart disease Father    Kidney disease Sister    Hypertension Sister    Diabetes Sister    Hypertension Sister    Kidney failure Sister    Hypertension Brother    CVA Brother    Breast cancer Maternal Aunt 50   Ovarian cancer Neg Hx    Colon cancer Neg Hx     Social History   Tobacco Use   Smoking status: Never   Smokeless tobacco: Never  Substance Use Topics   Alcohol use: No    Alcohol/week: 0.0 standard drinks of alcohol     Current Outpatient Medications:    Alirocumab (PRALUENT) 150 MG/ML SOAJ, Inject 1 mL (150 mg total) into the skin every 14  (fourteen) days., Disp: 6 mL, Rfl: 1   aspirin 81 MG tablet, Take 81 mg by mouth daily., Disp: , Rfl:    Calcium Carb-Cholecalciferol (CALCIUM 500+D3 PO), Take 2 tablets by mouth daily., Disp: , Rfl:    Cholecalciferol (VITAMIN D3) 1000 units CAPS, Take 1,000 Units by mouth daily., Disp: , Rfl:    cyanocobalamin (VITAMIN B12) 1000 MCG tablet, Take 1,000 mcg by mouth daily., Disp: , Rfl:    cyclobenzaprine (FLEXERIL) 5 MG tablet, Take 2 tablets (10 mg total) by mouth 3 (three) times daily as needed for muscle spasms., Disp: 30 tablet, Rfl: 0   dapagliflozin propanediol (FARXIGA) 10 MG TABS tablet, Take 1 tablet (10 mg total) by mouth daily. Through [place] Patient Assistance, Disp: 90 tablet, Rfl: 3   diclofenac Sodium (  VOLTAREN) 1 % GEL, Apply 2 g topically 4 (four) times daily as needed., Disp: 100 g, Rfl: 2   exemestane (AROMASIN) 25 MG tablet, Take 1 tablet (25 mg total) by mouth daily. (Patient taking differently: Take 25 mg by mouth every other day.), Disp: 90 tablet, Rfl: 3   ezetimibe (ZETIA) 10 MG tablet, Take 1 tablet (10 mg total) by mouth daily., Disp: 90 tablet, Rfl: 1   gabapentin (NEURONTIN) 100 MG capsule, Take 2 capsules (200 mg total) by mouth at bedtime as needed., Disp: 180 capsule, Rfl: 1   glucose blood test strip, , Disp: , Rfl:    ketoconazole (NIZORAL) 2 % cream, Apply 1 application topically daily., Disp: 60 g, Rfl: 0   levothyroxine (SYNTHROID) 125 MCG tablet, TAKE 1 TABLET BY MOUTH DAILY, Disp: 30 tablet, Rfl: 0   Olmesartan-amLODIPine-HCTZ 40-5-25 MG TABS, Take 1 tablet by mouth daily at 12 noon., Disp: 90 tablet, Rfl: 1   Semaglutide (RYBELSUS) 14 MG TABS, Take 1 tablet (14 mg total) by mouth daily at 6 (six) AM. Through [employer] Patient Assistance, Disp: 90 tablet, Rfl: 0   predniSONE (STERAPRED UNI-PAK 21 TAB) 10 MG (21) TBPK tablet, Take as directed on package.  (60 mg po on day 1, 50 mg po on day 2...) (Patient not taking: Reported on 07/29/2023), Disp: 21 tablet,  Rfl: 0  Allergies  Allergen Reactions   Pantoprazole Other (See Comments)    Affects thyroid    Rosuvastatin Other (See Comments)    Myalgia     I personally reviewed active problem list, medication list, allergies, family history, social history, health maintenance with the patient/caregiver today.   ROS  Ten systems reviewed and is negative except as mentioned in HPI    Objective  Vitals:   07/29/23 0842  BP: 124/72  Pulse: 97  Resp: 16  Temp: 98 F (36.7 C)  TempSrc: Oral  SpO2: 99%  Weight: 213 lb 12.8 oz (97 kg)  Height: 5\' 4"  (1.626 m)    Body mass index is 36.7 kg/m.  Physical Exam  Constitutional: Patient appears well-developed and well-nourished. Obese  No distress.  HEENT: head atraumatic, normocephalic, pupils equal and reactive to light, neck supple Cardiovascular: Normal rate, regular rhythm and normal heart sounds.  2/6 SEM heard. No BLE edema. Pulmonary/Chest: Effort normal and breath sounds normal. No respiratory distress. Abdominal: Soft.  There is no tenderness. Psychiatric: Patient has a normal mood and affect. behavior is normal. Judgment and thought content normal.    PHQ2/9:    07/29/2023    8:48 AM 07/11/2023   10:57 AM 03/27/2023   10:17 AM 01/30/2023    1:33 PM 01/14/2023    8:21 AM  Depression screen PHQ 2/9  Decreased Interest 1 0 0 0 1  Down, Depressed, Hopeless 1 0 0 0 0  PHQ - 2 Score 2 0 0 0 1  Altered sleeping 1  0 0 1  Tired, decreased energy 1  0 0 1  Change in appetite 1  0 0 0  Feeling bad or failure about yourself  0  0 0 0  Trouble concentrating 0  0 0 0  Moving slowly or fidgety/restless 1  0 0 0  Suicidal thoughts 0  0 0 0  PHQ-9 Score 6  0 0 3  Difficult doing work/chores Somewhat difficult    Not difficult at all    phq 9 is positive   Fall Risk:    07/29/2023  8:48 AM 07/11/2023   10:57 AM 04/03/2023   10:58 AM 03/27/2023   10:17 AM 01/30/2023    1:33 PM  Fall Risk   Falls in the past year? 0 0 0 0 0   Number falls in past yr:  0 0 0 0  Injury with Fall?  0 0 0 0  Risk for fall due to : No Fall Risks  No Fall Risks No Fall Risks No Fall Risks  Follow up Falls prevention discussed  Falls prevention discussed;Education provided;Falls evaluation completed Falls prevention discussed Falls prevention discussed      Functional Status Survey: Is the patient deaf or have difficulty hearing?: No Does the patient have difficulty seeing, even when wearing glasses/contacts?: No Does the patient have difficulty concentrating, remembering, or making decisions?: No Does the patient have difficulty walking or climbing stairs?: No Does the patient have difficulty dressing or bathing?: No Does the patient have difficulty doing errands alone such as visiting a doctor's office or shopping?: No    Assessment & Plan  1. Dyslipidemia due to type 2 diabetes mellitus (HCC)  - ezetimibe (ZETIA) 10 MG tablet; Take 1 tablet (10 mg total) by mouth daily.  Dispense: 90 tablet; Refill: 1 - Alirocumab (PRALUENT) 150 MG/ML SOAJ; Inject 1 mL (150 mg total) into the skin every 14 (fourteen) days.  Dispense: 6 mL; Refill: 1  2. Stage 3a chronic kidney disease (HCC)  Stable GFR, reviewed labs with patient   3. Mild pulmonary hypertension (HCC)  She is not using her CPAP machine  4. Morbid obesity (HCC)  Discussed with the patient the risk posed by an increased BMI. Discussed importance of portion control, calorie counting and at least 150 minutes of physical activity weekly. Avoid sweet beverages and drink more water. Eat at least 6 servings of fruit and vegetables daily    5. Statin myopathy  - ezetimibe (ZETIA) 10 MG tablet; Take 1 tablet (10 mg total) by mouth daily.  Dispense: 90 tablet; Refill: 1 - Alirocumab (PRALUENT) 150 MG/ML SOAJ; Inject 1 mL (150 mg total) into the skin every 14 (fourteen) days.  Dispense: 6 mL; Refill: 1  6. Back pain with radiculopathy  - gabapentin (NEURONTIN) 100 MG  capsule; Take 2 capsules (200 mg total) by mouth at bedtime as needed.  Dispense: 180 capsule; Refill: 1  7. Post-surgical hypothyroidism  - TSH  8. Need for immunization against influenza  - Flu Vaccine Trivalent High Dose (Fluad)  9. Hypertensive left ventricular hypertrophy, without heart failure  - Olmesartan-amLODIPine-HCTZ 40-5-25 MG TABS; Take 1 tablet by mouth daily at 12 noon.  Dispense: 90 tablet; Refill: 1

## 2023-07-29 ENCOUNTER — Ambulatory Visit (INDEPENDENT_AMBULATORY_CARE_PROVIDER_SITE_OTHER): Payer: Medicare HMO | Admitting: Family Medicine

## 2023-07-29 ENCOUNTER — Encounter: Payer: Self-pay | Admitting: Family Medicine

## 2023-07-29 VITALS — BP 124/72 | HR 97 | Temp 98.0°F | Resp 16 | Ht 64.0 in | Wt 213.8 lb

## 2023-07-29 DIAGNOSIS — E1169 Type 2 diabetes mellitus with other specified complication: Secondary | ICD-10-CM | POA: Diagnosis not present

## 2023-07-29 DIAGNOSIS — Z7984 Long term (current) use of oral hypoglycemic drugs: Secondary | ICD-10-CM

## 2023-07-29 DIAGNOSIS — Z23 Encounter for immunization: Secondary | ICD-10-CM | POA: Diagnosis not present

## 2023-07-29 DIAGNOSIS — M5410 Radiculopathy, site unspecified: Secondary | ICD-10-CM | POA: Diagnosis not present

## 2023-07-29 DIAGNOSIS — I272 Pulmonary hypertension, unspecified: Secondary | ICD-10-CM | POA: Diagnosis not present

## 2023-07-29 DIAGNOSIS — E785 Hyperlipidemia, unspecified: Secondary | ICD-10-CM | POA: Diagnosis not present

## 2023-07-29 DIAGNOSIS — G720 Drug-induced myopathy: Secondary | ICD-10-CM

## 2023-07-29 DIAGNOSIS — E890 Postprocedural hypothyroidism: Secondary | ICD-10-CM | POA: Diagnosis not present

## 2023-07-29 DIAGNOSIS — N1831 Chronic kidney disease, stage 3a: Secondary | ICD-10-CM | POA: Diagnosis not present

## 2023-07-29 DIAGNOSIS — I119 Hypertensive heart disease without heart failure: Secondary | ICD-10-CM | POA: Diagnosis not present

## 2023-07-29 DIAGNOSIS — T466X5A Adverse effect of antihyperlipidemic and antiarteriosclerotic drugs, initial encounter: Secondary | ICD-10-CM | POA: Diagnosis not present

## 2023-07-29 MED ORDER — GABAPENTIN 100 MG PO CAPS: 200.0000 mg | ORAL_CAPSULE | Freq: Every evening | ORAL | 1 refills | Status: DC | PRN

## 2023-07-29 MED ORDER — OLMESARTAN-AMLODIPINE-HCTZ 40-5-25 MG PO TABS: 1.0000 | ORAL_TABLET | Freq: Every day | ORAL | 1 refills | Status: DC

## 2023-07-29 MED ORDER — EZETIMIBE 10 MG PO TABS: 10.0000 mg | ORAL_TABLET | Freq: Every day | ORAL | 1 refills | Status: DC

## 2023-07-29 MED ORDER — PRALUENT 150 MG/ML ~~LOC~~ SOAJ: 1.0000 mL | SUBCUTANEOUS | 1 refills | Status: DC

## 2023-08-12 DIAGNOSIS — M5136 Other intervertebral disc degeneration, lumbar region: Secondary | ICD-10-CM | POA: Diagnosis not present

## 2023-08-12 DIAGNOSIS — G8929 Other chronic pain: Secondary | ICD-10-CM | POA: Diagnosis not present

## 2023-08-12 DIAGNOSIS — M4807 Spinal stenosis, lumbosacral region: Secondary | ICD-10-CM | POA: Diagnosis not present

## 2023-08-12 DIAGNOSIS — M5416 Radiculopathy, lumbar region: Secondary | ICD-10-CM | POA: Diagnosis not present

## 2023-08-12 DIAGNOSIS — M25562 Pain in left knee: Secondary | ICD-10-CM | POA: Diagnosis not present

## 2023-08-12 DIAGNOSIS — M48062 Spinal stenosis, lumbar region with neurogenic claudication: Secondary | ICD-10-CM | POA: Diagnosis not present

## 2023-08-12 DIAGNOSIS — M25561 Pain in right knee: Secondary | ICD-10-CM | POA: Diagnosis not present

## 2023-08-13 ENCOUNTER — Other Ambulatory Visit: Payer: Self-pay | Admitting: Family Medicine

## 2023-08-13 DIAGNOSIS — M4807 Spinal stenosis, lumbosacral region: Secondary | ICD-10-CM

## 2023-08-20 ENCOUNTER — Other Ambulatory Visit: Payer: Self-pay | Admitting: Family Medicine

## 2023-08-20 DIAGNOSIS — E890 Postprocedural hypothyroidism: Secondary | ICD-10-CM

## 2023-08-27 ENCOUNTER — Ambulatory Visit
Admission: RE | Admit: 2023-08-27 | Discharge: 2023-08-27 | Disposition: A | Discharge status: Home / Self Care | Payer: Medicare HMO | Source: Ambulatory Visit | Attending: Family Medicine | Admitting: Family Medicine

## 2023-08-27 DIAGNOSIS — M4807 Spinal stenosis, lumbosacral region: Secondary | ICD-10-CM

## 2023-08-27 DIAGNOSIS — M47816 Spondylosis without myelopathy or radiculopathy, lumbar region: Secondary | ICD-10-CM | POA: Diagnosis not present

## 2023-08-27 DIAGNOSIS — M5126 Other intervertebral disc displacement, lumbar region: Secondary | ICD-10-CM | POA: Diagnosis not present

## 2023-08-27 DIAGNOSIS — M48061 Spinal stenosis, lumbar region without neurogenic claudication: Secondary | ICD-10-CM | POA: Diagnosis not present

## 2023-09-10 ENCOUNTER — Inpatient Hospital Stay: Payer: Medicare HMO | Attending: Oncology

## 2023-09-10 ENCOUNTER — Encounter: Payer: Self-pay | Admitting: Oncology

## 2023-09-10 ENCOUNTER — Inpatient Hospital Stay (HOSPITAL_BASED_OUTPATIENT_CLINIC_OR_DEPARTMENT_OTHER): Payer: Medicare HMO | Admitting: Oncology

## 2023-09-10 VITALS — BP 142/81 | HR 82 | Temp 97.3°F | Ht 64.0 in | Wt 213.0 lb

## 2023-09-10 DIAGNOSIS — Z7982 Long term (current) use of aspirin: Secondary | ICD-10-CM | POA: Insufficient documentation

## 2023-09-10 DIAGNOSIS — E1122 Type 2 diabetes mellitus with diabetic chronic kidney disease: Secondary | ICD-10-CM | POA: Diagnosis not present

## 2023-09-10 DIAGNOSIS — Z170 Estrogen receptor positive status [ER+]: Secondary | ICD-10-CM | POA: Diagnosis not present

## 2023-09-10 DIAGNOSIS — Z7984 Long term (current) use of oral hypoglycemic drugs: Secondary | ICD-10-CM | POA: Diagnosis not present

## 2023-09-10 DIAGNOSIS — N1832 Chronic kidney disease, stage 3b: Secondary | ICD-10-CM | POA: Diagnosis not present

## 2023-09-10 DIAGNOSIS — C50211 Malignant neoplasm of upper-inner quadrant of right female breast: Secondary | ICD-10-CM | POA: Diagnosis not present

## 2023-09-10 DIAGNOSIS — Z79899 Other long term (current) drug therapy: Secondary | ICD-10-CM | POA: Insufficient documentation

## 2023-09-10 DIAGNOSIS — I129 Hypertensive chronic kidney disease with stage 1 through stage 4 chronic kidney disease, or unspecified chronic kidney disease: Secondary | ICD-10-CM | POA: Insufficient documentation

## 2023-09-10 DIAGNOSIS — Z79811 Long term (current) use of aromatase inhibitors: Secondary | ICD-10-CM

## 2023-09-10 LAB — CMP (CANCER CENTER ONLY)
ALT: 19 U/L (ref 0–44)
AST: 18 U/L (ref 15–41)
Albumin: 4 g/dL (ref 3.5–5.0)
Alkaline Phosphatase: 44 U/L (ref 38–126)
Anion gap: 8 (ref 5–15)
BUN: 15 mg/dL (ref 8–23)
CO2: 27 mmol/L (ref 22–32)
Calcium: 9.2 mg/dL (ref 8.9–10.3)
Chloride: 103 mmol/L (ref 98–111)
Creatinine: 1.21 mg/dL — ABNORMAL HIGH (ref 0.44–1.00)
GFR, Estimated: 50 mL/min — ABNORMAL LOW (ref >60)
Glucose, Bld: 122 mg/dL — ABNORMAL HIGH (ref 70–99)
Potassium: 3.4 mmol/L — ABNORMAL LOW (ref 3.5–5.1)
Sodium: 138 mmol/L (ref 135–145)
Total Bilirubin: 0.5 mg/dL (ref 0.3–1.2)
Total Protein: 7.4 g/dL (ref 6.5–8.1)

## 2023-09-10 LAB — CBC WITH DIFFERENTIAL (CANCER CENTER ONLY)
Abs Immature Granulocytes: 0 10*3/uL (ref 0.00–0.07)
Basophils Absolute: 0 10*3/uL (ref 0.0–0.1)
Basophils Relative: 1 %
Eosinophils Absolute: 0.2 10*3/uL (ref 0.0–0.5)
Eosinophils Relative: 5 %
HCT: 42.8 % (ref 36.0–46.0)
Hemoglobin: 14.1 g/dL (ref 12.0–15.0)
Immature Granulocytes: 0 %
Lymphocytes Relative: 42 %
Lymphs Abs: 1.5 10*3/uL (ref 0.7–4.0)
MCH: 27.4 pg (ref 26.0–34.0)
MCHC: 32.9 g/dL (ref 30.0–36.0)
MCV: 83.1 fL (ref 80.0–100.0)
Monocytes Absolute: 0.2 10*3/uL (ref 0.1–1.0)
Monocytes Relative: 6 %
Neutro Abs: 1.7 10*3/uL (ref 1.7–7.7)
Neutrophils Relative %: 46 %
Platelet Count: 382 10*3/uL (ref 150–400)
RBC: 5.15 MIL/uL — ABNORMAL HIGH (ref 3.87–5.11)
RDW: 13.8 % (ref 11.5–15.5)
Smear Review: NORMAL
WBC Count: 3.7 10*3/uL — ABNORMAL LOW (ref 4.0–10.5)
nRBC: 0 % (ref 0.0–0.2)

## 2023-09-10 NOTE — Assessment & Plan Note (Addendum)
Recommend calcium and vitamin D Repeat DEXA in July 2024 showed normal bone density.

## 2023-09-10 NOTE — Assessment & Plan Note (Signed)
Encourage oral hydration and avoid nephrotoxins.   

## 2023-09-10 NOTE — Assessment & Plan Note (Addendum)
Right breast invasive carcinoma.  pT1a pN0, ER positive, PR positive, HER2 negative. Focal DCIS margin positive.Patient declined reexcision.s/p adjuvant radiation.  Unable to tolerate Arimidex  switched to Aromasin. Labs reviewed and discussed with patient Continue Aromasin 25mg  daily. -Encourage patient to take Aromasin daily Continue annual mammogram,  March 2025,

## 2023-09-10 NOTE — Progress Notes (Signed)
Hematology/Oncology Progress note [phone] [phone]     CHIEF COMPLAINTS/REASON FOR VISIT:  Follow up of breast cancer.    ASSESSMENT & PLAN:   Cancer Staging  Malignant 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, estrogen receptor positive (HCC) Staging form: Breast, AJCC 8th Edition - Pathologic stage from 03/20/2021: Stage IA (pT1a, pN0, cM0, G2, ER+, PR+, HER2-) - Signed by Rickard Patience, MD on 03/20/2021   Malignant 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, estrogen receptor positive (HCC) Right breast invasive carcinoma.  pT1a pN0, ER positive, PR positive, HER2 negative. Focal DCIS margin positive.Patient declined reexcision.s/p adjuvant radiation.  Unable to tolerate Arimidex  switched to Aromasin. Labs reviewed and discussed with patient Continue Aromasin 25mg  daily. -Encourage patient to take Aromasin daily Continue annual mammogram,  March 2025,     Aromatase inhibitor use Recommend calcium and vitamin D Repeat DEXA in July 2024 showed normal bone density.   Stage 3b chronic kidney disease (HCC) Encourage oral hydration and avoid nephrotoxins.     Orders Placed This Encounter  Procedures   MM 3D DIAGNOSTIC MAMMOGRAM BILATERAL BREAST    Standing Status:   Future    Standing Expiration Date:   09/09/2024    Order Specific Question:   Reason for Exam (SYMPTOM  OR DIAGNOSIS REQUIRED)    Answer:   hx breasrt cancer    Order Specific Question:   Preferred imaging location?    Answer:   [facility]   [place] LIMITED ULTRASOUND INCLUDING AXILLA LEFT BREAST     Standing Status:   Future    Standing Expiration Date:   09/09/2024    Order Specific Question:   Reason for Exam (SYMPTOM  OR DIAGNOSIS REQUIRED)    Answer:   hx breast cancer    Order Specific Question:   Preferred imaging location?    Answer:   [facility]   [place] LIMITED ULTRASOUND INCLUDING AXILLA RIGHT BREAST    Standing Status:   Future     Standing Expiration Date:   09/09/2024    Order Specific Question:   Reason for Exam (SYMPTOM  OR DIAGNOSIS REQUIRED)    Answer:   hx breast cancer    Order Specific Question:   Preferred imaging location?    Answer:   [facility]   CBC with Differential (Cancer Center Only)    Standing Status:   Future    Standing Expiration Date:   09/09/2024   CMP (Cancer Center only)    Standing Status:   Future    Standing Expiration Date:   09/09/2024   Recommend 6 months.  All questions were answered. The patient knows to call the clinic with any problems, questions or concerns.  Rickard Patience, MD, PhD [facility] Hematology Oncology 09/10/2023      HISTORY OF PRESENTING ILLNESS:   Andrea Randolph is a  67 y.o.  female with PMH listed below was seen in consultation at the request of  Alba Cory, MD  for evaluation of breast cancer.   01/12/21 screening mammogram showed focal asymmetry with possible distortion.  01/26/21 right diagnostic mammogram and US showed 3x5x6mm hypoechoic mass, s/p biopsy on 02/07/21 Pathology showed invasive mammary carcinoma, grade 2, ER90% PR 90%, HER2 negative.   Patient was seen by Dr.Byrnett yesterday. She presents to establish care with oncology Accompanied by her daughter  Menarche 89 years with Age at first childbirth 41.  OCP use for about 20 years Denies any estrogen replacement.  LMP in 53s. Denies any previous chest radiation. History of left breast biopsy previously. Family history Father for maternal aunt who was diagnosed of breast cancer at age of 34.  03/06/2021 lumpectomy and sentinel lymph node biopsy. Pathology showed invasive mammary carcinoma, no special type, DCIS in situ, intermediate grade.  All 3 sentinel lymph nodes were negative.  1 nonsentinel lymph node was negative.  Margins for invasive carcinoma was negative.  Margins for DCIS was unifocal positive. Patient declined reexcision.Patient's case was discussed on breast cancer tumor  board on 03/20/2021.  # 05/03/2021 finished adjuvant radiation.  05/09/2021, patient started on Arimidex.  Patient called to report that she has experienced significant leg pain, back pain, neck pain shortly after starting Arimidex.  left knee pain and swelling.  06/20/2021, left knee x-ray showed diffuse joint space narrowing of the left knee with spurring at the lateral compartment.  Small joint effusion.  07/20/2021 patient was seen by orthopedic surgeon for left knee arthritis.  Patient received intra-articular corticosteroid injection and left knee pain has improved.  01/15/2022  bilateral diagnostic mammogram results were reviewed and discussed with patient.  Continue annual mammogram. Bone health, 05/16/2021, normal bone density.  INTERVAL HISTORY Andrea Randolph is a 67 y.o. female who has above history reviewed by me today presents for follow up visit for management of breast cancer + Chronic Arthralgia and hot flash, manageable.  she takes Aromasin on some days Otherwise no new complaints.   Review of Systems  Constitutional:  Negative for appetite change, chills, fatigue and fever.  HENT:   Negative for hearing loss and voice change.   Eyes:  Negative for eye problems.  Respiratory:  Negative for chest tightness and cough.   Cardiovascular:  Negative for chest pain.  Gastrointestinal:  Negative for abdominal distention, abdominal pain and blood in stool.  Endocrine: Negative for hot flashes.  Genitourinary:  Negative for difficulty urinating and frequency.   Musculoskeletal:  Positive for arthralgias.  Skin:  Negative for itching and rash.  Neurological:  Negative for extremity weakness.  Hematological:  Negative for adenopathy.  Psychiatric/Behavioral:  Negative for confusion. The patient is not nervous/anxious.     MEDICAL HISTORY:  Past Medical History:  Diagnosis Date   Allergy    Anemia    Arthritis of right shoulder region    Dr. Katrinka Blazing   Cancer [facility])    breast cancer right  side   Chronic insomnia    Diabetes mellitus without complication (HCC)    Family history of adverse reaction to anesthesia    sister with Nausea   GERD (gastroesophageal reflux disease)    Goiter    Dr. Tedd Sias   Heart murmur    Hyperlipidemia    Hypertension    Hypertensive retinopathy of left eye    Hypothyroidism, adult    Dr. Tedd Sias   Inflammation of urethra    Menopause    Microalbuminuria    DM   Mixed incontinence    Muscle cramps    Obesity    OSA (obstructive sleep apnea)    Personal history of radiation therapy    Breast Cancer Right breast   Radiculitis, lumbosacral    Vitamin D deficiency     SURGICAL HISTORY: Past Surgical History:  Procedure Laterality Date   BREAST BIOPSY Left 90's   benign core needle    BREAST BIOPSY Right 02/07/2021   [place] Bx, Q-clip, DCIS   BREAST LUMPECTOMY Right 03/06/2021   BREAST LUMPECTOMY WITH SENTINEL LYMPH NODE  BIOPSY Right 03/06/2021   Procedure: BREAST LUMPECTOMY WITH SENTINEL LYMPH NODE BX;  Surgeon: Earline Mayotte, MD;  Location: [facility] ORS;  Service: General;  Laterality: Right;   COLONOSCOPY WITH PROPOFOL N/A 01/26/2019   Procedure: COLONOSCOPY WITH PROPOFOL;  Surgeon: Toney Reil, MD;  Location: [facility] ENDOSCOPY;  Service: Gastroenterology;  Laterality: N/A;   COLONOSCOPY WITH PROPOFOL N/A 02/09/2022   Procedure: COLONOSCOPY WITH PROPOFOL;  Surgeon: Wyline Mood, MD;  Location: [facility] ENDOSCOPY;  Service: Gastroenterology;  Laterality: N/A;   FOOT SURGERY Bilateral    heel spurs   JOINT REPLACEMENT     TOTAL SHOULDER REPLACEMENT Right 01/25/2015   Dr. Joice Lofts   TOTAL THYROIDECTOMY  08/01/2018   TRIGGER FINGER RELEASE Right 06/10/2023   ring finger - Dr. Rosita Kea    SOCIAL HISTORY: Social History   Socioeconomic History   Marital status: Legally Separated    Spouse name: Not on file   Number of children: 1   Years of education: Not on file   Highest education level: 12th grade  Occupational History    Occupation: Biomedical scientist  Tobacco Use   Smoking status: Never   Smokeless tobacco: Never  Vaping Use   Vaping status: Never Used  Substance and Sexual Activity   Alcohol use: No    Alcohol/week: 0.0 standard drinks of alcohol   Drug use: No   Sexual activity: Not Currently    Birth control/protection: Post-menopausal  Other Topics Concern   Not on file  Social History Narrative   Separated in around 2009.   She was living with her daughter, however currently moved in with her sister and nephew , but is looking for a house.    Social Determinants of Health   Financial Resource Strain: Low Risk  (01/30/2023)   Overall Financial Resource Strain (CARDIA)    Difficulty of Paying Living Expenses: Not hard at all  Food Insecurity: No Food Insecurity (01/30/2023)   Hunger Vital Sign    Worried About Running Out of Food in the Last Year: Never true    Ran Out of Food in the Last Year: Never true  Transportation Needs: No Transportation Needs (01/30/2023)   PRAPARE - Administrator, Civil Service (Medical): No    Lack of Transportation (Non-Medical): No  Physical Activity: Insufficiently Active (01/30/2023)   Exercise Vital Sign    Days of Exercise per Week: 1 day    Minutes of Exercise per Session: 10 min  Stress: No Stress Concern Present (01/30/2023)   [employer] of Occupational Health - Occupational Stress Questionnaire    Feeling of Stress : Only a little  Recent Concern: Stress - Stress Concern Present (01/14/2023)   [employer] of Occupational Health - Occupational Stress Questionnaire    Feeling of Stress : To some extent  Social Connections: Moderately Integrated (01/30/2023)   Social Connection and Isolation Panel [NHANES]    Frequency of Communication with Friends and Family: More than three times a week    Frequency of Social Gatherings with Friends and Family: Twice a week    Attends Religious Services: More than 4 times per year    Active  Member of [employer] or Organizations: Yes    Attends Banker Meetings: More than 4 times per year    Marital Status: Separated  Intimate Partner Violence: Not At Risk (01/30/2023)   Humiliation, Afraid, Rape, and Kick questionnaire    Fear of Current or Ex-Partner: No    Emotionally Abused: No  Physically Abused: No    Sexually Abused: No    FAMILY HISTORY: Family History  Problem Relation Age of Onset   Heart disease Mother    Heart disease Father    Kidney disease Sister    Hypertension Sister    Diabetes Sister    Hypertension Sister    Kidney failure Sister    Hypertension Brother    CVA Brother    Breast cancer Maternal Aunt 50   Ovarian cancer Neg Hx    Colon cancer Neg Hx     ALLERGIES:  is allergic to pantoprazole and rosuvastatin.  MEDICATIONS:  Current Outpatient Medications  Medication Sig Dispense Refill   Alirocumab (PRALUENT) 150 MG/ML SOAJ Inject 1 mL (150 mg total) into the skin every 14 (fourteen) days. 6 mL 1   aspirin 81 MG tablet Take 81 mg by mouth daily.     Calcium Carb-Cholecalciferol (CALCIUM 500+D3 PO) Take 2 tablets by mouth daily.     Cholecalciferol (VITAMIN D3) 1000 units CAPS Take 1,000 Units by mouth daily.     cyanocobalamin (VITAMIN B12) 1000 MCG tablet Take 1,000 mcg by mouth daily.     cyclobenzaprine (FLEXERIL) 5 MG tablet Take 2 tablets (10 mg total) by mouth 3 (three) times daily as needed for muscle spasms. 30 tablet 0   dapagliflozin propanediol (FARXIGA) 10 MG TABS tablet Take 1 tablet (10 mg total) by mouth daily. Through [place] Patient Assistance 90 tablet 3   diclofenac Sodium (VOLTAREN) 1 % GEL Apply 2 g topically 4 (four) times daily as needed. 100 g 2   exemestane (AROMASIN) 25 MG tablet Take 1 tablet (25 mg total) by mouth daily. (Patient taking differently: Take 25 mg by mouth every other day.) 90 tablet 3   ezetimibe (ZETIA) 10 MG tablet Take 1 tablet (10 mg total) by mouth daily. 90 tablet 1   gabapentin  (NEURONTIN) 100 MG capsule Take 2 capsules (200 mg total) by mouth at bedtime as needed. 180 capsule 1   glucose blood test strip      ketoconazole (NIZORAL) 2 % cream Apply 1 application topically daily. 60 g 0   levothyroxine (SYNTHROID) 125 MCG tablet TAKE 1 TABLET BY MOUTH DAILY 30 tablet 1   Olmesartan-amLODIPine-HCTZ 40-5-25 MG TABS Take 1 tablet by mouth daily at 12 noon. 90 tablet 1   Semaglutide (RYBELSUS) 14 MG TABS Take 1 tablet (14 mg total) by mouth daily at 6 (six) AM. Through [employer] Patient Assistance 90 tablet 0   No current facility-administered medications for this visit.     PHYSICAL EXAMINATION: ECOG PERFORMANCE STATUS: 0 - Asymptomatic Vitals:   09/10/23 0949  BP: (!) 142/81  Pulse: 82  Temp: (!) 97.3 F (36.3 C)  SpO2: 97%   Filed Weights   09/10/23 0949  Weight: 213 lb (96.6 kg)    Physical Exam Constitutional:      General: She is not in acute distress. HENT:     Head: Normocephalic and atraumatic.  Eyes:     General: No scleral icterus. Cardiovascular:     Rate and Rhythm: Normal rate and regular rhythm.     Heart sounds: Murmur heard.  Pulmonary:     Effort: Pulmonary effort is normal. No respiratory distress.     Breath sounds: No wheezing.  Abdominal:     General: Bowel sounds are normal. There is no distension.     Palpations: Abdomen is soft.  Musculoskeletal:        General: No  deformity. Normal range of motion.     Cervical back: Normal range of motion and neck supple.  Skin:    General: Skin is warm and dry.     Findings: No erythema or rash.  Neurological:     Mental Status: She is alert and oriented to person, place, and time. Mental status is at baseline.     Cranial Nerves: No cranial nerve deficit.     Coordination: Coordination normal.  Psychiatric:        Mood and Affect: Mood normal.    Breast exam was performed in seated and lying down position. Patient is status post right lumpectomy with a well-healed surgical  scar.   No palpable breast masses bilaterally.  No palpable axillary adenopathy bilaterally.   LABORATORY DATA:  I have reviewed the data as listed    Latest Ref Rng & Units 09/10/2023    9:44 AM 07/11/2023   11:34 AM 03/11/2023    8:17 AM  CBC  WBC 4.0 - 10.5 K/uL 3.7  4.2  3.6   Hemoglobin 12.0 - 15.0 g/dL 96.0  45.4  09.8   Hematocrit 36.0 - 46.0 % 42.8  42.0  38.6   Platelets 150 - 400 K/uL 382  380  332       Latest Ref Rng & Units 09/10/2023    9:44 AM 07/11/2023   11:34 AM 03/11/2023    8:17 AM  CMP  Glucose 70 - 99 mg/dL 119  147  829   BUN 8 - 23 mg/dL 15  13  15    Creatinine 0.44 - 1.00 mg/dL 5.62  1.30  8.65   Sodium 135 - 145 mmol/L 138  140  135   Potassium 3.5 - 5.1 mmol/L 3.4  3.9  3.4   Chloride 98 - 111 mmol/L 103  100  102   CO2 22 - 32 mmol/L 27  29  25    Calcium 8.9 - 10.3 mg/dL 9.2  78.4  8.9   Total Protein 6.5 - 8.1 g/dL 7.4  7.3  7.1   Total Bilirubin 0.3 - 1.2 mg/dL 0.5  0.5  0.4   Alkaline Phos 38 - 126 U/L 44   44   AST 15 - 41 U/L 18  15  20    ALT 0 - 44 U/L 19  13  17       RADIOGRAPHIC STUDIES: I have personally reviewed the radiological images as listed and agreed with the findings in the report. No results found.

## 2023-09-13 ENCOUNTER — Ambulatory Visit: Payer: Medicare HMO | Admitting: Oncology

## 2023-09-13 ENCOUNTER — Other Ambulatory Visit: Payer: Medicare HMO

## 2023-09-18 ENCOUNTER — Encounter: Payer: Self-pay | Admitting: Pharmacist

## 2023-09-18 ENCOUNTER — Other Ambulatory Visit: Payer: Medicare HMO | Admitting: Pharmacist

## 2023-09-18 NOTE — Patient Instructions (Signed)
Goals Addressed             This Visit's Progress    Pharmacy Goals       Please watch the mail for an envelope from [facility] containing the patient assistance program application. Please complete this application and bring to office to have it faxed back to Attention: Linward Foster at Fax # [phone] along with a copy of your Medicare Part D prescription card and a copy of your proof of income document.   If you need to call Joni Reining, you can reach her at [phone].   If you need to reach out to patient assistance programs regarding refills or to find out the status of your application, you can do so by calling:   [employer] at [phone] [place] at [phone]   Thank you!   Estelle Grumbles, PharmD, [facility] [phone]  Our goal A1c is less than 7%. This corresponds with fasting sugars less than 130 and 2 hour after meal sugars less than 180. Please keep a log of your results when checking your blood sugar    Check your blood pressure once weekly, and any time you have concerning symptoms like headache, chest pain, dizziness, shortness of breath, or vision changes.   Our goal is less than 130/80.  To appropriately check your blood pressure, make sure you do the following:  1) Avoid caffeine, exercise, or tobacco products for 30 minutes before checking. Empty your bladder. 2) Sit with your back supported in a flat-backed chair. Rest your arm on something flat (arm of the chair, table, etc). 3) Sit still with your feet flat on the floor, resting, for at least 5 minutes.  4) Check your blood pressure. Take 1-2 readings.  5) Write down these readings and bring with you to any provider appointments.  Bring your home blood pressure machine with you to a provider's office for accuracy comparison at least once a year.   Make sure you take your blood pressure medications before you come to any office visit, even if you were asked to  fast for labs.     Thank you!   Estelle Grumbles, PharmD, [facility] [phone]

## 2023-09-18 NOTE — Progress Notes (Unsigned)
09/18/2023 Name: Andrea Randolph MRN: [id number] DOB: 1956/06/26  Chief Complaint  Patient presents with   Medication Assistance   Medication Management    Andrea Randolph is a 67 y.o. year old female who presented for a telephone visit.   They were referred to the pharmacist by their PCP for assistance in managing medication access.    Subjective:  Care Team: Primary Care Provider: Alba Cory, MD ; Next Scheduled Visit: 01/15/2024 Oncology: Rickard Patience, MD  Rheumatology: Tracey Harries, MD  Neurosurgery: Venetia Night, MD; Upcoming Visit: 09/24/2023 Physical Medicine and Rehabilitation: Chanda Busing, DO  Cardiologist: Julien Nordmann, MD  Medication Access/Adherence  Current Pharmacy:  [facility] [address], [place] - [address] [place] [address] Phone: [phone] Fax: [phone]  [facility] - [place], [place] - [address] [place] [address] Phone: [phone] Fax: [phone]  [id number] - [place], [place] - [address] [place] [address] Phone: [phone] Fax: [phone]  [facility] - [place], [place] - [address] [place] [address] Phone: [phone] Fax: [phone]   Patient reports affordability concerns with their medications: No  Patient reports access/transportation concerns to their pharmacy: No  Patient reports adherence concerns with their medications:  No     Diabetes:  Current medications:  - Farxiga 10 mg daily - Rybelsus 14 mg daily Confirms takes on an empty stomach, >=30 minutes before the first food, beverage, or other oral medications of the day with <=4 oz of plain water only  Denies checking home blood sugar recently  Patient denies hypoglycemic s/sx including dizziness, shakiness, sweating.   Current medication access support: enrolled in patient assistance for  Farxiga from [employer] and for Rybelsus from [employer] for 2024 calendar year - Reports currently has a 2 month supply of Rybelsus remaining and 3 month supply of Farxiga remaining   Hypertension:  Current medications: olmesartan-amlodipine-HCTZ 40-5-25 mg daily  Patient has an automated, upper arm home BP cuff, but denies checking recently  Patient denies hypotensive s/sx including dizziness, lightheadedness.    Hyperlipidemia/ASCVD Risk Reduction  Current lipid lowering medications:  - Praluent 150 mg injection every 14 days - ezetimibe 10 mg daily  Medications tried in the past:   Antiplatelet regimen: aspirin 81 mg  ASCVD History: Per review of Cardiology notes, Cardiac CTA in 2023 showed mild nonobstructive CAD   Current medication access support: Enrolled in [facility] for [employer]. From review of chart, apprears enrollment ends 02/29/2024   Objective:  Lab Results  Component Value Date   HGBA1C 6.8 (H) 07/11/2023    Lab Results  Component Value Date   CREATININE 1.21 (H) 09/10/2023   BUN 15 09/10/2023   NA 138 09/10/2023   K 3.4 (L) 09/10/2023   CL 103 09/10/2023   CO2 27 09/10/2023    Lab Results  Component Value Date   CHOL 156 07/11/2023   HDL 55 07/11/2023   LDLCALC 85 07/11/2023   TRIG 72 07/11/2023   CHOLHDL 2.8 07/11/2023   BP Readings from Last 3 Encounters:  09/10/23 (!) 142/81  07/29/23 124/72  07/11/23 128/76   Pulse Readings from Last 3 Encounters:  09/10/23 82  07/29/23 97  07/11/23 82     Medications Reviewed Today     Reviewed by Manuela Neptune, RPH-CPP (Pharmacist) on 09/18/23 at 1030  Med List Status: <None>   Medication Order Taking? Sig Documenting Provider Last Dose Status Informant  acetaminophen (TYLENOL) 500 MG tablet [id number] Yes Take 500 mg by mouth every 6 (six) hours as needed. [provider] Taking Active   Alirocumab (PRALUENT) 150 MG/ML SOAJ [id number] Yes Inject 1 mL (150 mg  total) into the skin every 14 (fourteen) days. Alba Cory, MD Taking Active   aspirin 81 MG tablet [id number] Yes Take 81 mg by mouth daily. [provider] Taking Active Self, Pharmacy Records, Multiple Informants, Other           Med Note Doyne Keel Jun 27, 2020  3:41 PM)    Calcium Carb-Cholecalciferol (CALCIUM 500+D3 PO) [id number] Yes Take 1 tablet by mouth daily. [provider] Taking Active Pharmacy Records, Multiple Informants, Self, Other  Cholecalciferol (VITAMIN D3) 1000 units CAPS [id number] Yes Take 1,000 Units by mouth daily. [provider] Taking Active Self, Pharmacy Records, Multiple Informants, Other  cyanocobalamin (VITAMIN B12) 1000 MCG tablet [id number] Yes Take 1,000 mcg by mouth daily. [provider] Taking Active   dapagliflozin propanediol (FARXIGA) 10 MG TABS tablet [id number] Yes Take 1 tablet (10 mg total) by mouth daily. Through [employer] Patient Assistance Alba Cory, MD Taking Active   diclofenac Sodium (VOLTAREN) 1 % GEL [id number] Yes Apply 2 g topically 4 (four) times daily as needed. Alba Cory, MD Taking Active Pharmacy Records, Multiple Informants, Self, Other  exemestane (AROMASIN) 25 MG tablet [id number] Yes Take 1 tablet (25 mg total) by mouth daily. Rickard Patience, MD Taking Active   ezetimibe (ZETIA) 10 MG tablet [id number] Yes Take 1 tablet (10 mg total) by mouth daily. Alba Cory, MD Taking Active   gabapentin (NEURONTIN) 100 MG capsule [id number] Yes Take 2 capsules (200 mg total) by mouth at bedtime as needed. Alba Cory, MD Taking Active   glucose blood test strip [id number]   [provider]  Active Self, Pharmacy Records, Multiple Informants, Other           Med Note Doyne Keel Jun 27, 2020  3:42 PM)    levothyroxine (SYNTHROID) 125 MCG tablet [id number] Yes TAKE 1 TABLET BY MOUTH DAILY Alba Cory, MD Taking Active   Olmesartan-amLODIPine-HCTZ 40-5-25 MG TABS [id number] Yes  Take 1 tablet by mouth daily at 12 noon. Alba Cory, MD Taking Active   Semaglutide [facility]) 14 MG TABS [id number] Yes Take 1 tablet (14 mg total) by mouth daily at 6 (six) AM. Through [employer] Patient Assistance Alba Cory, MD Taking Active               Assessment/Plan:   Comprehensive medication review performed; medication list updated in electronic medical record - Patient confirms separates administration of her calcium from levothyroxine  Diabetes: - Currently controlled - Reviewed long term cardiovascular and renal outcomes of uncontrolled blood sugar - Patient to have regular well-balanced meals and snacks throughout the day, while controlling carbohydrate portion sizes - Will collaborate with PCP and CPhT to aid patient with re-enrollment in patient assistance for Farxiga from [employer] and Rybelsus from [employer] for 2025   Hypertension: - Recommend to monitor home blood pressure, keep log of results and have this record to review at upcoming medical appointments. Patient to contact provider office sooner if needed for readings outside of established parameters or symptoms   Hyperlipidemia/ASCVD Risk Reduction: - Encourage patient to continue to take Praluent and ezetimibe as directed - Will plan to  follow up again to assist patient with re-enrollment in [facility] before current grant enrollment ends ~02/29/2024    Follow Up Plan: Clinical Pharmacist will follow up with patient by telephone on 11/27/2023 at 10:00 AM   Estelle Grumbles, PharmD, [facility] [phone]

## 2023-09-19 ENCOUNTER — Other Ambulatory Visit: Payer: Self-pay | Admitting: Family Medicine

## 2023-09-19 ENCOUNTER — Inpatient Hospital Stay
Admission: RE | Admit: 2023-09-19 | Discharge: 2023-09-19 | Disposition: A | Discharge status: Home / Self Care | Payer: Self-pay | Source: Ambulatory Visit | Attending: Orthopedic Surgery | Admitting: Orthopedic Surgery

## 2023-09-19 DIAGNOSIS — Z049 Encounter for examination and observation for unspecified reason: Secondary | ICD-10-CM

## 2023-09-19 DIAGNOSIS — L218 Other seborrheic dermatitis: Secondary | ICD-10-CM | POA: Diagnosis not present

## 2023-09-19 DIAGNOSIS — L700 Acne vulgaris: Secondary | ICD-10-CM | POA: Diagnosis not present

## 2023-09-20 ENCOUNTER — Other Ambulatory Visit: Payer: Self-pay | Admitting: Family Medicine

## 2023-09-20 DIAGNOSIS — I119 Hypertensive heart disease without heart failure: Secondary | ICD-10-CM

## 2023-09-20 NOTE — Progress Notes (Unsigned)
Referring Physician:  Denton Lank, FNP [address] [place],  [place] [address]  Primary Physician:  Alba Cory, MD  History of Present Illness: 09/20/2023 Andrea Randolph is here today with a chief complaint of ***  right sided low back pain with radiation to the right buttock and right lateral thigh and calf.   Duration: ***? Location: *** Quality: *** Severity: *** ? Precipitating: aggravated by activity Modifying factors: made better by Rest Weakness: none Timing: constant Bowel/Bladder Dysfunction: none  Conservative measures:  Physical therapy: 3 session at [facility] 04/29/2023  Multimodal medical therapy including regular antiinflammatories: Gabapentin, Tramadol, Voltaren gel  Injections: 07/18/2023: Bilateral S1 transforaminal ESI(20% relief for 3 days) 01/16/2022: Right L5-S1 and right S1 transforaminal ESI (complete relief of right leg pain continued bilateral low back pain)   Past Surgery: ***  Andrea Randolph has ***no symptoms of cervical myelopathy.  The symptoms are causing a significant impact on the patient's life.   I have utilized the care everywhere function in epic to review the outside records available from external health systems.  Review of Systems:  A 10 point review of systems is negative, except for the pertinent positives and negatives detailed in the HPI.  Past Medical History: Past Medical History:  Diagnosis Date   Allergy    Anemia    Arthritis of right shoulder region    Dr. Katrinka Blazing   Cancer [facility])    breast cancer right side   Chronic insomnia    Diabetes mellitus without complication (HCC)    Family history of adverse reaction to anesthesia    sister with Nausea   GERD (gastroesophageal reflux disease)    Goiter    Dr. Tedd Sias   Heart murmur    Hyperlipidemia    Hypertension    Hypertensive retinopathy of left eye    Hypothyroidism, adult    Dr. Tedd Sias   Inflammation of urethra    Menopause    Microalbuminuria     DM   Mixed incontinence    Muscle cramps    Obesity    OSA (obstructive sleep apnea)    Personal history of radiation therapy    Breast Cancer Right breast   Radiculitis, lumbosacral    Vitamin D deficiency     Past Surgical History: Past Surgical History:  Procedure Laterality Date   BREAST BIOPSY Left 90's   benign core needle    BREAST BIOPSY Right 02/07/2021   [place] Bx, Q-clip, DCIS   BREAST LUMPECTOMY Right 03/06/2021   BREAST LUMPECTOMY WITH SENTINEL LYMPH NODE BIOPSY Right 03/06/2021   Procedure: BREAST LUMPECTOMY WITH SENTINEL LYMPH NODE BX;  Surgeon: Earline Mayotte, MD;  Location: [facility] ORS;  Service: General;  Laterality: Right;   COLONOSCOPY WITH PROPOFOL N/A 01/26/2019   Procedure: COLONOSCOPY WITH PROPOFOL;  Surgeon: Toney Reil, MD;  Location: [facility] ENDOSCOPY;  Service: Gastroenterology;  Laterality: N/A;   COLONOSCOPY WITH PROPOFOL N/A 02/09/2022   Procedure: COLONOSCOPY WITH PROPOFOL;  Surgeon: Wyline Mood, MD;  Location: [facility] ENDOSCOPY;  Service: Gastroenterology;  Laterality: N/A;   FOOT SURGERY Bilateral    heel spurs   JOINT REPLACEMENT     TOTAL SHOULDER REPLACEMENT Right 01/25/2015   Dr. Joice Lofts   TOTAL THYROIDECTOMY  08/01/2018   TRIGGER FINGER RELEASE Right 06/10/2023   ring finger - Dr. Rosita Kea    Allergies: Allergies as of 09/24/2023 - Review Complete 09/10/2023  Allergen Reaction Noted   Pantoprazole Other (See Comments) 06/04/2022   Rosuvastatin  Other (See Comments) 10/20/2020    Medications:  Current Outpatient Medications:    acetaminophen (TYLENOL) 500 MG tablet, Take 500 mg by mouth every 6 (six) hours as needed., Disp: , Rfl:    Alirocumab (PRALUENT) 150 MG/ML SOAJ, Inject 1 mL (150 mg total) into the skin every 14 (fourteen) days., Disp: 6 mL, Rfl: 1   aspirin 81 MG tablet, Take 81 mg by mouth daily., Disp: , Rfl:    Calcium Carb-Cholecalciferol (CALCIUM 500+D3 PO), Take 1 tablet by mouth daily., Disp: , Rfl:    Cholecalciferol  (VITAMIN D3) 1000 units CAPS, Take 1,000 Units by mouth daily., Disp: , Rfl:    cyanocobalamin (VITAMIN B12) 1000 MCG tablet, Take 1,000 mcg by mouth daily., Disp: , Rfl:    dapagliflozin propanediol (FARXIGA) 10 MG TABS tablet, Take 1 tablet (10 mg total) by mouth daily. Through [employer] Patient Assistance, Disp: 90 tablet, Rfl: 3   diclofenac Sodium (VOLTAREN) 1 % GEL, Apply 2 g topically 4 (four) times daily as needed., Disp: 100 g, Rfl: 2   exemestane (AROMASIN) 25 MG tablet, Take 1 tablet (25 mg total) by mouth daily., Disp: 90 tablet, Rfl: 3   ezetimibe (ZETIA) 10 MG tablet, Take 1 tablet (10 mg total) by mouth daily., Disp: 90 tablet, Rfl: 1   gabapentin (NEURONTIN) 100 MG capsule, Take 2 capsules (200 mg total) by mouth at bedtime as needed., Disp: 180 capsule, Rfl: 1   glucose blood test strip, , Disp: , Rfl:    levothyroxine (SYNTHROID) 125 MCG tablet, TAKE 1 TABLET BY MOUTH DAILY, Disp: 30 tablet, Rfl: 1   Olmesartan-amLODIPine-HCTZ 40-5-25 MG TABS, Take 1 tablet by mouth daily at 12 noon., Disp: 90 tablet, Rfl: 1   Semaglutide (RYBELSUS) 14 MG TABS, Take 1 tablet (14 mg total) by mouth daily at 6 (six) AM. Through [employer] Patient Assistance, Disp: 90 tablet, Rfl: 0  Social History: Social History   Tobacco Use   Smoking status: Never   Smokeless tobacco: Never  Vaping Use   Vaping status: Never Used  Substance Use Topics   Alcohol use: No    Alcohol/week: 0.0 standard drinks of alcohol   Drug use: No    Family Medical History: Family History  Problem Relation Age of Onset   Heart disease Mother    Heart disease Father    Kidney disease Sister    Hypertension Sister    Diabetes Sister    Hypertension Sister    Kidney failure Sister    Hypertension Brother    CVA Brother    Breast cancer Maternal Aunt 50   Ovarian cancer Neg Hx    Colon cancer Neg Hx     Physical Examination: There were no vitals filed for this visit.  General: Patient is in no apparent  distress. Attention to examination is appropriate.  Neck:   Supple.  Full range of motion.  Respiratory: Patient is breathing without any difficulty.   NEUROLOGICAL:     Awake, alert, oriented to person, place, and time.  Speech is clear and fluent.   Cranial Nerves: Pupils equal round and reactive to light.  Facial tone is symmetric.  Facial sensation is symmetric. Shoulder shrug is symmetric. Tongue protrusion is midline.  There is no pronator drift.  Strength: Side Biceps Triceps Deltoid Interossei Grip Wrist Ext. Wrist Flex.  R 5 5 5 5 5 5 5   L 5 5 5 5 5 5 5    Side Iliopsoas Quads Hamstring PF DF EHL  R 5 5 5 5 5 5   L 5 5 5 5 5 5    Reflexes are ***2+ and symmetric at the biceps, triceps, brachioradialis, patella and achilles.   Hoffman's is absent.   Bilateral upper and lower extremity sensation is intact to light touch.    No evidence of dysmetria noted.  Gait is normal.     Medical Decision Making  Imaging: ***  I have personally reviewed the images and agree with the above interpretation.  Assessment and Plan: Andrea Randolph is a pleasant 67 y.o. female with ***    Thank you for involving me in the care of this patient.      Martisha Toulouse K. Myer Haff MD, [facility]

## 2023-09-24 ENCOUNTER — Ambulatory Visit: Payer: Medicare HMO | Admitting: Neurosurgery

## 2023-09-24 ENCOUNTER — Telehealth: Payer: Self-pay

## 2023-09-24 ENCOUNTER — Encounter: Payer: Self-pay | Admitting: Neurosurgery

## 2023-09-24 VITALS — BP 122/82 | Ht 64.0 in | Wt 211.6 lb

## 2023-09-24 DIAGNOSIS — G8929 Other chronic pain: Secondary | ICD-10-CM | POA: Diagnosis not present

## 2023-09-24 DIAGNOSIS — M5136 Other intervertebral disc degeneration, lumbar region with discogenic back pain only: Secondary | ICD-10-CM

## 2023-09-24 DIAGNOSIS — M5416 Radiculopathy, lumbar region: Secondary | ICD-10-CM

## 2023-09-24 DIAGNOSIS — M4316 Spondylolisthesis, lumbar region: Secondary | ICD-10-CM | POA: Diagnosis not present

## 2023-09-24 DIAGNOSIS — M5442 Lumbago with sciatica, left side: Secondary | ICD-10-CM

## 2023-09-24 NOTE — Telephone Encounter (Signed)
-----   Message from Manuela Neptune sent at 09/18/2023 10:56 AM EST ----- Joni Reining,  Would you please assist patient with applying for re-enrollment both for:  - Farxiga PAP from [employer] as prescribed by Dr. Carlynn Purl - Rybelsus PAP from [employer] as prescribed by Dr. Carlynn Purl  Thank you!  Gentry Fitz

## 2023-09-24 NOTE — Telephone Encounter (Signed)
A user error has taken place: charting done on wrong patient and has been corrected.

## 2023-09-24 NOTE — Telephone Encounter (Signed)
 PAP: PAP application for Farxiga, Publishing rights manager ([employer])) has been mailed to pt's home address on file. Will fax provider portion of application to provider's office when pt's portion is received.    PLEASE BE ADVISED

## 2023-09-24 NOTE — Progress Notes (Signed)
Pharmacy Medication Assistance Program Note    09/24/2023  Patient ID: Andrea Randolph, female   DOB: 1956-07-30, 67 y.o.   MRN: [id number]     09/24/2023  Outreach Medication One  Initial Outreach Date (Medication One) 09/24/2023  Manufacturer Medication One [employer]  [employer] [facility] Farxiga  Dose of Farxiga 10MG   Type of Radiographer, therapeutic Assistance  Date Application Sent to Patient 09/24/2023  Application Items Requested Proof of Income  Name of Prescriber Danna Hefty, SOWLES       Signature

## 2023-10-14 ENCOUNTER — Other Ambulatory Visit: Payer: Self-pay | Admitting: Family Medicine

## 2023-10-14 DIAGNOSIS — E890 Postprocedural hypothyroidism: Secondary | ICD-10-CM

## 2023-10-14 NOTE — Telephone Encounter (Signed)
Has appt 1/3

## 2023-10-15 ENCOUNTER — Telehealth: Payer: Self-pay

## 2023-10-15 NOTE — Progress Notes (Signed)
Pharmacy Medication Assistance Program Note    10/15/2023  Patient ID: Andrea Randolph, female  DOB: 01-17-1956, 67 y.o.  MRN:  [id number]     10/15/2023  Outreach Medication Two  Initial Outreach Date (Medication Two) 09/18/2023  Manufacturer Medication Two Novo Nordisk  [employer] [facility] Rybelsus  Dose of Ryblesus 14MG   Type of Radiographer, therapeutic Assistance  Date Application Sent to Patient 09/18/2023  Application Items Requested Application;Proof of Income  Date Application Sent to Prescriber 10/15/2023  Name of Prescriber Alba Cory MD  Date Application Received From Patient 10/15/2023  Method Application Sent to Manufacturer Fax       Signature

## 2023-10-15 NOTE — Telephone Encounter (Signed)
-----   Message from Manuela Neptune sent at 09/18/2023 10:56 AM EST ----- Joni Reining,  Would you please assist patient with applying for re-enrollment both for:  - Farxiga PAP from [employer] as prescribed by Dr. Carlynn Purl - Rybelsus PAP from [employer] as prescribed by Dr. Carlynn Purl  Thank you!  Gentry Fitz

## 2023-10-15 NOTE — Telephone Encounter (Signed)
FAXED TO PROVIDER OFFICE OF Alba Cory MD for RYBELSUS([employer]) PAP.     PLEASE BE ADVISED I HAVE NOT RECEIVED PT PAGES FOR THIS PAP PT IS MAILING BACK OUT BUT I WENT ON SENT PROVIDER PAGES FOR BOTH [employer] AND [employer]

## 2023-10-24 NOTE — Telephone Encounter (Signed)
RECEIVED BACK  PROVIDER PAGES FOR RYBELSUS STILL WAITING ON PT PAGES  PLEASE BE ADVISED

## 2023-10-24 NOTE — Telephone Encounter (Signed)
PAP: Application for Marcelline Deist has been submitted to PAP Companies: [employer], via fax    PLEASE BE ADVISED

## 2023-11-12 ENCOUNTER — Ambulatory Visit: Payer: Medicare HMO | Admitting: Family Medicine

## 2023-11-15 ENCOUNTER — Ambulatory Visit: Payer: Medicare HMO | Admitting: Family Medicine

## 2023-11-15 ENCOUNTER — Encounter: Payer: Self-pay | Admitting: Family Medicine

## 2023-11-15 VITALS — BP 118/68 | HR 90 | Resp 16 | Ht 64.0 in | Wt 211.2 lb

## 2023-11-15 DIAGNOSIS — M5442 Lumbago with sciatica, left side: Secondary | ICD-10-CM | POA: Diagnosis not present

## 2023-11-15 DIAGNOSIS — M5410 Radiculopathy, site unspecified: Secondary | ICD-10-CM | POA: Diagnosis not present

## 2023-11-15 DIAGNOSIS — C50411 Malignant neoplasm of upper-outer quadrant of right female breast: Secondary | ICD-10-CM | POA: Diagnosis not present

## 2023-11-15 DIAGNOSIS — N1831 Chronic kidney disease, stage 3a: Secondary | ICD-10-CM | POA: Diagnosis not present

## 2023-11-15 DIAGNOSIS — G8929 Other chronic pain: Secondary | ICD-10-CM

## 2023-11-15 DIAGNOSIS — G4733 Obstructive sleep apnea (adult) (pediatric): Secondary | ICD-10-CM

## 2023-11-15 DIAGNOSIS — E785 Hyperlipidemia, unspecified: Secondary | ICD-10-CM

## 2023-11-15 DIAGNOSIS — I10 Essential (primary) hypertension: Secondary | ICD-10-CM

## 2023-11-15 DIAGNOSIS — G720 Drug-induced myopathy: Secondary | ICD-10-CM | POA: Diagnosis not present

## 2023-11-15 DIAGNOSIS — Z170 Estrogen receptor positive status [ER+]: Secondary | ICD-10-CM

## 2023-11-15 DIAGNOSIS — E1169 Type 2 diabetes mellitus with other specified complication: Secondary | ICD-10-CM | POA: Diagnosis not present

## 2023-11-15 DIAGNOSIS — G629 Polyneuropathy, unspecified: Secondary | ICD-10-CM

## 2023-11-15 DIAGNOSIS — E890 Postprocedural hypothyroidism: Secondary | ICD-10-CM | POA: Diagnosis not present

## 2023-11-15 DIAGNOSIS — I272 Pulmonary hypertension, unspecified: Secondary | ICD-10-CM | POA: Diagnosis not present

## 2023-11-15 DIAGNOSIS — E1129 Type 2 diabetes mellitus with other diabetic kidney complication: Secondary | ICD-10-CM

## 2023-11-15 DIAGNOSIS — T466X5A Adverse effect of antihyperlipidemic and antiarteriosclerotic drugs, initial encounter: Secondary | ICD-10-CM

## 2023-11-15 LAB — POCT GLYCOSYLATED HEMOGLOBIN (HGB A1C): Hemoglobin A1C: 7.1 % — AB (ref 4.0–5.6)

## 2023-11-15 NOTE — Progress Notes (Addendum)
 Name: Andrea Randolph   MRN: [id number]    DOB: 03/07/1956   Date:11/15/2023       Progress Note  Subjective  Chief Complaint  Chief Complaint  Patient presents with   Medical Management of Chronic Issues    HPI  Discussed the use of AI scribe software for clinical note transcription with the patient, who gave verbal consent to proceed.  History of Present Illness   The patient, a 68 year old with a history of type 2 diabetes, dyslipidemia, chronic kidney disease stage 3A, mild pulmonary hypertension, neuropathy, chronic low back pain with left-sided sciatica, hypothyroidism, hypertension, and a history of breast cancer, presents for a routine follow-up. The patient's A1c has increased slightly from 6.8 to 7.1, which she attributes to dietary indulgences over the holiday season. She expresses a commitment to dietary control moving forward.  The patient is currently on Rybelsus , Farxiga , olmesartan , amlodipine , HCTZ, ezetimibe , Praluent  and Aromasin . She reports good tolerance and adherence to these medications. She has stopped taking gabapentin  due to dislike of its effects. She also reports a stable weight and no significant side effects from her current medications.  The patient has been experiencing chronic low back pain radiating down the left leg, which she describes as manageable and not severe enough to warrant surgery. She also reports a history of obstructive sleep apnea, for which she is not currently using a CPAP machine.   Post surgical hypothyroidism, she is compliant with medication and denies any problems  The patient has a history of breast cancer and is currently on Aromasin . Breast cancer was  estrogen receptor positive. She still sees Dr. Babara , hematologist   The patient had a recent bout of a viral illness with vomiting and diarrhea, which resolved within 24 hours. She denies any chest pain or palpitations. Her blood pressure at this visit was on the lower end of normal, but  she does not report any symptoms of dizziness.     DM type 2 associated with dyslipidemia and microalbuminuria , she is taking medications ARB and SGL-2 agonist also on Praluent  and Zetia  for dyslipidemia   Morbid obesity: BMI above 35 with DM, HTN, dyslipidemia, working on life style modifications  Patient Active Problem List   Diagnosis Date Noted   Chronic bilateral low back pain with left-sided sciatica 07/11/2023   Anemia in stage 3a chronic kidney disease (HCC) 09/06/2022   Genetic testing 03/28/2022   Stage 3b chronic kidney disease (HCC) 03/07/2022   Aromatase inhibitor use 05/30/2021   Complaints of total body pain 05/30/2021   Malignant 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, estrogen receptor positive (HCC) 02/20/2021   Thrombocytosis 11/01/2019   Carpal tunnel syndrome, bilateral 02/19/2019   Trigger finger of right thumb 02/19/2019   Flexor tenosynovitis of finger 02/19/2019   Severe obesity (BMI 35.0-35.9 with comorbidity) (HCC) 12/29/2018   Mild concentric left ventricular hypertrophy (LVH) 12/24/2017   Mild aortic stenosis by prior echocardiogram 12/24/2017   Mild aortic regurgitation 12/24/2017   CKD (chronic kidney disease), stage I 06/22/2016   Uterine fibroid 01/11/2016   Allergic rhinitis, seasonal 05/24/2015   Benign essential HTN 05/24/2015   Chronic constipation 05/24/2015   Diabetes mellitus with renal manifestation (HCC) 05/24/2015   Dyslipidemia 05/24/2015   Post-surgical hypothyroidism 05/24/2015   Mixed incontinence 05/24/2015   Microalbuminuria 05/24/2015   Menopause 05/24/2015   Obstructive apnea 05/24/2015   Morbid obesity (HCC) 05/24/2015   Hypertensive retinopathy 05/24/2015   Vitamin D  deficiency 05/24/2015  Status post total replacement of right shoulder 03/11/2015    Past Surgical History:  Procedure Laterality Date   BREAST BIOPSY Left 90's   benign core needle    BREAST BIOPSY Right 02/07/2021   US  Bx, Q-clip,  DCIS   BREAST LUMPECTOMY Right 03/06/2021   BREAST LUMPECTOMY WITH SENTINEL LYMPH NODE BIOPSY Right 03/06/2021   Procedure: BREAST LUMPECTOMY WITH SENTINEL LYMPH NODE BX;  Surgeon: Dessa Reyes ORN, MD;  Location: [facility] ORS;  Service: General;  Laterality: Right;   COLONOSCOPY WITH PROPOFOL  N/A 01/26/2019   Procedure: COLONOSCOPY WITH PROPOFOL ;  Surgeon: Unk Corinn Skiff, MD;  Location: [facility] ENDOSCOPY;  Service: Gastroenterology;  Laterality: N/A;   COLONOSCOPY WITH PROPOFOL  N/A 02/09/2022   Procedure: COLONOSCOPY WITH PROPOFOL ;  Surgeon: Therisa Bi, MD;  Location: [facility] ENDOSCOPY;  Service: Gastroenterology;  Laterality: N/A;   FOOT SURGERY Bilateral    heel spurs   JOINT REPLACEMENT     TOTAL SHOULDER REPLACEMENT Right 01/25/2015   Dr. Edie   TOTAL THYROIDECTOMY  08/01/2018   TRIGGER FINGER RELEASE Right 06/10/2023   ring finger - Dr. Kathlynn    Family History  Problem Relation Age of Onset   Heart disease Mother    Heart disease Father    Kidney disease Sister    Hypertension Sister    Diabetes Sister    Hypertension Sister    Kidney failure Sister    Hypertension Brother    CVA Brother    Breast cancer Maternal Aunt 50   Ovarian cancer Neg Hx    Colon cancer Neg Hx     Social History   Tobacco Use   Smoking status: Never   Smokeless tobacco: Never  Substance Use Topics   Alcohol use: No    Alcohol/week: 0.0 standard drinks of alcohol     Current Outpatient Medications:    acetaminophen  (TYLENOL ) 500 MG tablet, Take 500 mg by mouth every 6 (six) hours as needed., Disp: , Rfl:    Alirocumab  (PRALUENT ) 150 MG/ML SOAJ, Inject 1 mL (150 mg total) into the skin every 14 (fourteen) days., Disp: 6 mL, Rfl: 1   aspirin 81 MG tablet, Take 81 mg by mouth daily., Disp: , Rfl:    Calcium  Carb-Cholecalciferol (CALCIUM  500+D3 PO), Take 1 tablet by mouth daily., Disp: , Rfl:    Cholecalciferol (VITAMIN D3) 1000 units CAPS, Take 1,000 Units by mouth daily., Disp: , Rfl:     clindamycin (CLEOCIN T) 1 % external solution, Apply 1 Application topically every morning., Disp: , Rfl:    cyanocobalamin  (VITAMIN B12) 1000 MCG tablet, Take 1,000 mcg by mouth daily., Disp: , Rfl:    dapagliflozin  propanediol (FARXIGA ) 10 MG TABS tablet, Take 1 tablet (10 mg total) by mouth daily. Through [place] Patient Assistance, Disp: 90 tablet, Rfl: 3   diclofenac  Sodium (VOLTAREN ) 1 % GEL, Apply 2 g topically 4 (four) times daily as needed., Disp: 100 g, Rfl: 2   exemestane  (AROMASIN ) 25 MG tablet, Take 1 tablet (25 mg total) by mouth daily., Disp: 90 tablet, Rfl: 3   ezetimibe  (ZETIA ) 10 MG tablet, Take 1 tablet (10 mg total) by mouth daily., Disp: 90 tablet, Rfl: 1   gabapentin  (NEURONTIN ) 100 MG capsule, Take 2 capsules (200 mg total) by mouth at bedtime as needed., Disp: 180 capsule, Rfl: 1   glucose blood test strip, , Disp: , Rfl:    levothyroxine  (SYNTHROID ) 125 MCG tablet, TAKE 1 TABLET BY MOUTH DAILY, Disp: 30 tablet, Rfl: 0   Olmesartan -amLODIPine -HCTZ  40-5-25 MG TABS, Take 1 tablet by mouth daily at 12 noon., Disp: 90 tablet, Rfl: 1   Semaglutide  (RYBELSUS ) 14 MG TABS, Take 1 tablet (14 mg total) by mouth daily at 6 (six) AM. Through [employer] Patient Assistance, Disp: 90 tablet, Rfl: 0   spironolactone (ALDACTONE) 100 MG tablet, Take 100 mg by mouth daily., Disp: , Rfl:    tretinoin (RETIN-A) 0.025 % cream, Apply 1 Application topically daily., Disp: , Rfl:   Allergies  Allergen Reactions   Pantoprazole  Other (See Comments)    Affects thyroid     Rosuvastatin  Other (See Comments)    Myalgia     I personally reviewed active problem list, medication list, allergies with the patient/caregiver today.   ROS  Ten systems reviewed and is negative except as mentioned in HPI    Objective  Vitals:   11/15/23 1519  BP: 118/68  Pulse: 90  Resp: 16  SpO2: 95%  Weight: 211 lb 3.2 oz (95.8 kg)  Height: 5' 4 (1.626 m)    Body mass index is 36.25 kg/m.  Physical  Exam  Constitutional: Patient appears well-developed and well-nourished. Obese  No distress.  HEENT: head atraumatic, normocephalic, pupils equal and reactive to light, neck supple Cardiovascular: Normal rate, regular rhythm and normal heart sounds.  No murmur heard. No BLE edema. Pulmonary/Chest: Effort normal and breath sounds normal. No respiratory distress. Abdominal: Soft.  There is no tenderness. Psychiatric: Patient has a normal mood and affect. behavior is normal. Judgment and thought content normal.   Recent Results (from the past 2160 hours)  CMP (Cancer Center only)     Status: Abnormal   Collection Time: 09/10/23  9:44 AM  Result Value Ref Range   Sodium 138 135 - 145 mmol/L   Potassium 3.4 (L) 3.5 - 5.1 mmol/L   Chloride 103 98 - 111 mmol/L   CO2 27 22 - 32 mmol/L   Glucose, Bld 122 (H) 70 - 99 mg/dL    Comment: Glucose reference range applies only to samples taken after fasting for at least 8 hours.   BUN 15 8 - 23 mg/dL   Creatinine 8.78 (H) 9.55 - 1.00 mg/dL   Calcium  9.2 8.9 - 10.3 mg/dL   Total Protein 7.4 6.5 - 8.1 g/dL   Albumin 4.0 3.5 - 5.0 g/dL   AST 18 15 - 41 U/L   ALT 19 0 - 44 U/L   Alkaline Phosphatase 44 38 - 126 U/L   Total Bilirubin 0.5 0.3 - 1.2 mg/dL   GFR, Estimated 50 (L) >60 mL/min    Comment: (NOTE) Calculated using the CKD-EPI Creatinine Equation (2021)    Anion gap 8 5 - 15    Comment: Performed at [facility], [address] [place], [place] [address]  CBC with Differential (Cancer Center Only)     Status: Abnormal   Collection Time: 09/10/23  9:44 AM  Result Value Ref Range   WBC Count 3.7 (L) 4.0 - 10.5 K/uL   RBC 5.15 (H) 3.87 - 5.11 MIL/uL   Hemoglobin 14.1 12.0 - 15.0 g/dL   HCT 57.1 63.9 - 53.9 %   MCV 83.1 80.0 - 100.0 fL   MCH 27.4 26.0 - 34.0 pg   MCHC 32.9 30.0 - 36.0 g/dL   RDW 86.1 88.4 - 84.4 %   Platelet Count 382 150 - 400 K/uL   nRBC 0.0 0.0 - 0.2 %   Neutrophils Relative % 46 %   Neutro Abs 1.7 1.7 -  7.7  K/uL   Lymphocytes Relative 42 %   Lymphs Abs 1.5 0.7 - 4.0 K/uL   Monocytes Relative 6 %   Monocytes Absolute 0.2 0.1 - 1.0 K/uL   Eosinophils Relative 5 %   Eosinophils Absolute 0.2 0.0 - 0.5 K/uL   Basophils Relative 1 %   Basophils Absolute 0.0 0.0 - 0.1 K/uL   WBC Morphology DIFF. CONFIRMED BY SMEAR    RBC Morphology MORPHOLOGY UNREMARKABLE    Smear Review Normal platelet morphology     Comment: PLATELETS APPEAR ADEQUATE   Immature Granulocytes 0 %   Abs Immature Granulocytes 0.00 0.00 - 0.07 K/uL    Comment: Performed at [facility], [address] [place], [place] [address]  POCT glycosylated hemoglobin (Hb A1C)     Status: Abnormal   Collection Time: 11/15/23  3:28 PM  Result Value Ref Range   Hemoglobin A1C 7.1 (A) 4.0 - 5.6 %   HbA1c POC (<> result, manual entry)     HbA1c, POC (prediabetic range)     HbA1c, POC (controlled diabetic range)      Diabetic Foot Exam:     PHQ2/9:    11/15/2023    3:18 PM 07/29/2023    8:48 AM 07/11/2023   10:57 AM 03/27/2023   10:17 AM 01/30/2023    1:33 PM  Depression screen PHQ 2/9  Decreased Interest 0 1 0 0 0  Down, Depressed, Hopeless 0 1 0 0 0  PHQ - 2 Score 0 2 0 0 0  Altered sleeping 0 1  0 0  Tired, decreased energy 0 1  0 0  Change in appetite 0 1  0 0  Feeling bad or failure about yourself  0 0  0 0  Trouble concentrating 0 0  0 0  Moving slowly or fidgety/restless 0 1  0 0  Suicidal thoughts 0 0  0 0  PHQ-9 Score 0 6  0 0  Difficult doing work/chores Not difficult at all Somewhat difficult       phq 9 is negative   Fall Risk:    11/15/2023    3:09 PM 07/29/2023    8:48 AM 07/11/2023   10:57 AM 04/03/2023   10:58 AM 03/27/2023   10:17 AM  Fall Risk   Falls in the past year? 0 0 0 0 0  Number falls in past yr: 0  0 0 0  Injury with Fall? 0  0 0 0  Risk for fall due to : No Fall Risks No Fall Risks  No Fall Risks No Fall Risks  Follow up Falls prevention discussed;Education provided;Falls evaluation  completed Falls prevention discussed  Falls prevention discussed;Education provided;Falls evaluation completed Falls prevention discussed     Assessment & Plan   Assessment and Plan    Type 2 Diabetes Mellitus A1c increased from 6.8 to 7.1, likely due to holiday indulgences. Patient is on Rybelsus  14mg  and Farxiga  10mg . -Encourage adherence to diet and exercise regimen. -Continue current medications. -Check A1c at next visit.  Dyslipidemia Patient is on ezetimibe  and Praluent , with LDL reduced to 85 at last check. -Continue current medications. -Check lipid panel at next visit.  Hypertension Blood pressure is well-controlled but on the lower end of normal. Patient is on olmesartan , amlodipine , and HCTZ. -Continue current medications. -Consider reducing HCTZ dose if blood pressure remains on the lower end of normal.  Chronic Kidney Disease Stage 3A Stable with GFR in the 50s. Patient is on Farxiga  10mg . -Continue  Farxiga . -Check renal function at next visit.  Chronic Low Back Pain with Left-Sided Sciatica Pain has improved and is manageable. Patient has stopped taking gabapentin  due to side effects. -No changes to current management.  Hypothyroidism Patient is on thyroid  replacement therapy. -Check TSH at next visit.  Breast Cancer, Estrogen Receptor Positive Patient is on Aromasin . -Continue Aromasin .  Obstructive Sleep Apnea Patient is not on CPAP therapy. -No changes to current management.  Mild Pulmonary Hypertension No current treatment. -Follow-up with cardiologist as scheduled.  Melasma Patient is on Retin-A. -Continue Retin-A.  General Health Maintenance / Followup Plans -Physical exam scheduled for March 2025. -Check thyroid  function, proteinuria, and complete blood count at next visit. -Follow-up in 4 months due to trending A1c and blood pressure.

## 2023-11-21 ENCOUNTER — Other Ambulatory Visit: Payer: Self-pay | Admitting: *Deleted

## 2023-11-21 MED ORDER — EXEMESTANE 25 MG PO TABS: 25.0000 mg | ORAL_TABLET | Freq: Every day | ORAL | 1 refills | Status: DC

## 2023-11-26 ENCOUNTER — Encounter: Payer: Self-pay | Admitting: Physical Therapy

## 2023-11-26 ENCOUNTER — Ambulatory Visit: Payer: Medicare HMO | Attending: Neurosurgery

## 2023-11-26 DIAGNOSIS — G8929 Other chronic pain: Secondary | ICD-10-CM | POA: Insufficient documentation

## 2023-11-26 DIAGNOSIS — M5459 Other low back pain: Secondary | ICD-10-CM | POA: Diagnosis not present

## 2023-11-26 DIAGNOSIS — M6281 Muscle weakness (generalized): Secondary | ICD-10-CM | POA: Insufficient documentation

## 2023-11-26 DIAGNOSIS — M4316 Spondylolisthesis, lumbar region: Secondary | ICD-10-CM | POA: Insufficient documentation

## 2023-11-26 DIAGNOSIS — M5442 Lumbago with sciatica, left side: Secondary | ICD-10-CM | POA: Insufficient documentation

## 2023-11-26 DIAGNOSIS — M5416 Radiculopathy, lumbar region: Secondary | ICD-10-CM | POA: Insufficient documentation

## 2023-11-26 NOTE — Therapy (Signed)
 OUTPATIENT PHYSICAL THERAPY THORACOLUMBAR EVALUATION   Patient Name: Andrea Randolph MRN: [id number] DOB:[birth year], 68 y.o., female Today's Date: 11/26/2023  END OF SESSION:  PT End of Session - 11/26/23 0809     Visit Number 1    Number of Visits 13    Date for PT Re-Evaluation 01/07/24    Progress Note Due on Visit 10    PT Start Time 0815    PT Stop Time 0858    PT Time Calculation (min) 43 min    Activity Tolerance Patient tolerated treatment well    Behavior During Therapy [facility] for tasks assessed/performed             Past Medical History:  Diagnosis Date   Allergy    Anemia    Arthritis of right shoulder region    Dr. Claudene   Cancer [facility])    breast cancer right side   Chronic insomnia    Diabetes mellitus without complication (HCC)    Family history of adverse reaction to anesthesia    sister with Nausea   GERD (gastroesophageal reflux disease)    Goiter    Dr. Damian   Heart murmur    Hyperlipidemia    Hypertension    Hypertensive retinopathy of left eye    Hypothyroidism, adult    Dr. Damian   Inflammation of urethra    Menopause    Microalbuminuria    DM   Mixed incontinence    Muscle cramps    Obesity    OSA (obstructive sleep apnea)    Personal history of radiation therapy    Breast Cancer Right breast   Radiculitis, lumbosacral    Vitamin D  deficiency    Past Surgical History:  Procedure Laterality Date   BREAST BIOPSY Left 90's   benign core needle    BREAST BIOPSY Right 02/07/2021   US  Bx, Q-clip, DCIS   BREAST LUMPECTOMY Right 03/06/2021   BREAST LUMPECTOMY WITH SENTINEL LYMPH NODE BIOPSY Right 03/06/2021   Procedure: BREAST LUMPECTOMY WITH SENTINEL LYMPH NODE BX;  Surgeon: Dessa Reyes ORN, MD;  Location: [facility] ORS;  Service: General;  Laterality: Right;   COLONOSCOPY WITH PROPOFOL  N/A 01/26/2019   Procedure: COLONOSCOPY WITH PROPOFOL ;  Surgeon: Unk Corinn Skiff, MD;  Location: [facility] ENDOSCOPY;  Service: Gastroenterology;   Laterality: N/A;   COLONOSCOPY WITH PROPOFOL  N/A 02/09/2022   Procedure: COLONOSCOPY WITH PROPOFOL ;  Surgeon: Therisa Bi, MD;  Location: [facility] ENDOSCOPY;  Service: Gastroenterology;  Laterality: N/A;   FOOT SURGERY Bilateral    heel spurs   JOINT REPLACEMENT     TOTAL SHOULDER REPLACEMENT Right 01/25/2015   Dr. Edie   TOTAL THYROIDECTOMY  08/01/2018   TRIGGER FINGER RELEASE Right 06/10/2023   ring finger - Dr. Kathlynn   Patient Active Problem List   Diagnosis Date Noted   Diabetes mellitus with microalbuminuria (HCC) 11/15/2023   Chronic bilateral low back pain with left-sided sciatica 07/11/2023   Genetic testing 03/28/2022   Stage 3a chronic kidney disease (CKD) (HCC) 03/07/2022   Aromatase inhibitor use 05/30/2021   Malignant 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, estrogen receptor positive (HCC) 02/20/2021   Carpal tunnel syndrome, bilateral 02/19/2019   Trigger finger of right thumb 02/19/2019   Flexor tenosynovitis of finger 02/19/2019   Mild concentric left ventricular hypertrophy (LVH) 12/24/2017   Mild aortic stenosis by prior echocardiogram 12/24/2017   Mild aortic regurgitation 12/24/2017   Uterine fibroid 01/11/2016   Allergic rhinitis, seasonal 05/24/2015   Benign  essential HTN 05/24/2015   Chronic constipation 05/24/2015   Dyslipidemia 05/24/2015   Post-surgical hypothyroidism 05/24/2015   Mixed incontinence 05/24/2015   Microalbuminuria 05/24/2015   Menopause 05/24/2015   Obstructive apnea 05/24/2015   Morbid obesity (HCC) 05/24/2015   Hypertensive retinopathy 05/24/2015   Vitamin D  deficiency 05/24/2015   Status post total replacement of right shoulder 03/11/2015    PCP: Glenard Mire, MD  REFERRING PROVIDER: Clois Fret, MD  REFERRING DIAG:  [phone] (ICD-10-CM) - Spondylolisthesis of lumbar region M54.16 (ICD-10-CM) - Lumbar radiculopathy M54.42,G89.29 (ICD-10-CM) - Chronic left-sided low back pain with left-sided  sciatica  Rationale for Evaluation and Treatment: Rehabilitation  THERAPY DIAG:  Other low back pain  Radiculopathy, lumbar region  Muscle weakness (generalized)  ONSET DATE: chronic  SUBJECTIVE:                                                                                                                                                                                           SUBJECTIVE STATEMENT: 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 Reports lifting and transitioning from sitting to standing. Repetitive movements tend to aggravate back pain. Dr. Clois mentioned back surgery but patient not wanting to go straight to back surgery. Wants to try other things prior to jumping to surgery. Patient reports L LE pain intermittently when she is walking and does have numbness constantly in her L inner thigh (knee up).   PERTINENT HISTORY:  Per Dr. Elliot note (09/24/2023), chief complaint of lower back and left leg pain.  She has numbness into her left thigh.  She also has some right sided low back pain.  She has been having pain in her back and legs for nearly 2 years.  She has had bilateral knee pain for 2 to 3 months.  Her pain can be as bad as 6 out of 10.  Activity makes it worse.  Rest improves her pain.  PAIN:  Are you having pain? No - 10/10 in L LE and back at worst   PRECAUTIONS: None  RED FLAGS: None   WEIGHT BEARING RESTRICTIONS: No  FALLS:  Has patient fallen in last 6 months? No  LIVING ENVIRONMENT: Lives with:  lives with sisters  Lives in: House/apartment Stairs: Yes: Internal: flight steps; on right going up and External: 6-7 steps; on right going up Has following equipment at home: None  OCCUPATION: retired   PLOF: Independent  PATIENT GOALS: learning techniques to help alleviate pain/discomfort    OBJECTIVE:  Note: Objective measures were completed at Evaluation unless otherwise  noted.  DIAGNOSTIC FINDINGS:  EXAM: MRI LUMBAR SPINE WITHOUT CONTRAST  IMPRESSION: 1. New moderate-sized disc extrusion in the left subarticular zone at L3-4 with cephalad extension of disc material, moderate mass effect on the thecal sac and left L3 nerve root encroachment. 2. Slightly progressive disc degeneration and facet hypertrophy at L4-5 with persistent moderate to severe multifactorial spinal stenosis. Moderate to severe right foraminal narrowing appears slightly worse. 3. Stable mild multifactorial spinal stenosis at L1-2 and L2-3. 4. Stable mild left greater than right foraminal narrowing at L5-S1.  PATIENT SURVEYS:  FOTO 60 with goal of 45   COGNITION: Overall cognitive status: Within functional limits for tasks assessed     SENSATION: Light touch: Impaired  - numbness noted to L inner thigh (L3 dermatome)  MUSCLE LENGTH: Equal hamstring tightness bilaterally Tightness noted to B piriformis (L>R)  POSTURE: rounded shoulders and forward head  PALPATION: To be further assessed at visit #2   LUMBAR ROM:   AROM eval  Flexion WFL  Extension WFL   Right lateral flexion WFL   Left lateral flexion WFL  Right rotation WFL   Left rotation WFL    (Blank rows = not tested)   LOWER EXTREMITY MMT:    MMT Right eval Left eval  Hip flexion 4 4  Hip extension    Hip abduction 4 4  Hip adduction 4 4  Hip internal rotation    Hip external rotation    Knee flexion 4+ 4+  Knee extension 4+ 4+  Ankle dorsiflexion 4+ 4+  Ankle plantarflexion    Ankle inversion    Ankle eversion     (Blank rows = not tested)  LUMBAR SPECIAL TESTS:  To be assessed visit #2  FUNCTIONAL TESTS:  5 times sit to stand: 16.28 seconds  6 minute walk test: to be tested visit #2  GAIT: Distance walked: 42' Assistive device utilized: None Level of assistance: Complete Independence Comments: no obvious deviations, self reports when fatigued that she feels that she walks funny    TREATMENT DATE: 11/26/23                                                                                                                               Eval only   PATIENT EDUCATION:  Education details: HEP, POC, goals  Person educated: Patient Education method: Medical Illustrator Education comprehension: verbalized understanding and returned demonstration  HOME EXERCISE PROGRAM: Access Code: [facility] URL: [email or web address] Date: 11/26/2023 Prepared by: Maryanne Finder  Exercises - Seated Piriformis Stretch  - 2-3 x daily - 5-7 x weekly - 3-5 reps - 30-60 second hold - Seated Hamstring Stretch  - 2-3 x daily - 5-7 x weekly - 3-5 reps - 30-60 second hold - Supine Lower Trunk Rotation  - 2-3 x daily - 5-7 x weekly - 10 reps - 3-5 second hold  ASSESSMENT:  CLINICAL IMPRESSION: Patient is a 68 y.o. female who was seen today for physical therapy evaluation and treatment  for low back pain with L LE pain/numbness. Patient presents with weakness, impaired sensation, pain, and decreased activity tolerance. Demonstrates decreased functional strength with 5xSTS time. Patient will benefit from skilled therapy to address remaining deficits in order to improve quality of life and return to PLOF.     OBJECTIVE IMPAIRMENTS: Abnormal gait, decreased activity tolerance, decreased endurance, decreased mobility, difficulty walking, decreased ROM, decreased strength, hypomobility, impaired sensation, postural dysfunction, and pain.   ACTIVITY LIMITATIONS: carrying, lifting, sitting, standing, and transfers  PARTICIPATION LIMITATIONS: cleaning, laundry, and community activity  PERSONAL FACTORS: Age, Behavior pattern, Past/current experiences, Time since onset of injury/illness/exacerbation, and 3+ comorbidities: hx of breast cancer, HTN, obesity, CKD stage 3a  are also affecting patient's functional outcome.   REHAB POTENTIAL: Good  CLINICAL DECISION MAKING:  Stable/uncomplicated  EVALUATION COMPLEXITY: Low   GOALS: Goals reviewed with patient? Yes  SHORT TERM GOALS: Target date: 12/17/2023  Patient will be independent in HEP to improve strength/mobility for better functional independence with ADLs. Baseline:1/14: HEP initiated  Goal status: INITIAL   LONG TERM GOALS: Target date: 01/07/2024  Patient will increase FOTO score to equal to or greater than 64 to demonstrate statistically significant improvement in mobility and quality of life.  Baseline: 1/14: 60 Goal status: INITIAL  2.  Patient (> 72 years old) will complete five times sit to stand test in < 13 seconds indicating an increased LE strength and improved balance. Baseline: 1/14: 16.28 seconds Goal status: INITIAL  3.  Patient will increase six minute walk test distance by 200' for to improve activity tolerance and gait ability  Baseline: 1/14: to be tested visit #2  Goal status: INITIAL  4.  Patient will increase BLE gross strength to 4+/5 as to improve functional strength for increased standing tolerance and increased ADL ability. Baseline: 1/14: see chart above  Goal status: INITIAL  5.  Patient will report a worst pain of 3/10 on NRPS in low back and LLE to improve tolerance with ADLs and reduced symptoms with activities. Baseline: 1/14: 10/10 pain  Goal status: INITIAL   PLAN:  PT FREQUENCY: 1-2x/week  PT DURATION: 6 weeks  PLANNED INTERVENTIONS: 97164- PT Re-evaluation, 97110-Therapeutic exercises, 97530- Therapeutic activity, 97112- Neuromuscular re-education, 97535- Self Care, 02859- Manual therapy, [id number]- Gait training, 97014- Electrical stimulation (unattended), [id number]- Electrical stimulation (manual), Patient/Family education, Dry Needling, Joint mobilization, Joint manipulation, Spinal manipulation, Spinal mobilization, DME instructions, Cryotherapy, and Moist heat.  PLAN FOR NEXT SESSION: HEP review, , lumbar spine assessment and special tests       Maryanne DELENA Finder, PT, DPT  Physical Therapist - [place]  [facility]  11/26/2023, 9:16 AM

## 2023-11-27 ENCOUNTER — Other Ambulatory Visit: Payer: Self-pay | Admitting: Pharmacist

## 2023-11-27 DIAGNOSIS — E1122 Type 2 diabetes mellitus with diabetic chronic kidney disease: Secondary | ICD-10-CM

## 2023-11-27 DIAGNOSIS — I10 Essential (primary) hypertension: Secondary | ICD-10-CM

## 2023-11-27 NOTE — Progress Notes (Signed)
 11/27/2023 Name: QUEEN VLAD MRN: [id number] DOB: 1956-06-27  Chief Complaint  Patient presents with   Medication Assistance   Medication Management    DELIA LEA is a 68 y.o. year old female who presented for a telephone visit.   They were referred to the pharmacist by their PCP for assistance in managing medication access.      Subjective:   Care Team: Primary Care Provider: Sowles, Krichna, MD ; Next Scheduled Visit: 01/15/2024 Oncology: Timmy Forbes, MD  Rheumatology: Melissa Spring, MD  Neurosurgery: Jodeen Munch, MD Physical Medicine and Rehabilitation: Erman Hayward Reva Castleman, DO  Cardiologist: Gollan, Timothy, MD; Next Scheduled Visit: 01/13/2024  Medication Access/Adherence  Current Pharmacy:  [facility] - [address], [place] - [address] [place] [address] Phone: [phone] Fax: [phone]  [facility] - [place]  - [place], [place] - [address] [place] [address] Phone: [phone] Fax: [phone]  [id number] - [place], [place] - [address] [place] [address] Phone: [phone] Fax: [phone]  [facility] - [place], [place] - [address] [place] [address] Phone: [phone] Fax: [phone]   Patient reports affordability concerns with their medications: No  Patient reports access/transportation concerns to their pharmacy: No  Patient reports adherence concerns with their medications:  No       Diabetes:   Current medications:  - Farxiga  10 mg daily - Rybelsus  14 mg daily Confirms takes on an empty stomach, >=30 minutes before the first food, beverage, or other oral medications of the day with <=4 oz of plain water only   Denies checking home blood sugar recently   Patient denies hypoglycemic s/sx including dizziness, shakiness, sweating.    Current medication access support: Collaborating with PCP  and CPhT to aid patient with re-enrollment in patient assistance for Farxiga  from [employer] and Rybelsus  from [employer] for 2025 - From review of chart, note CPhT Almira Jaeger has received provider portion of [employer], but has not yet received patient portion Today patient shares that she is not sure that she has this application    Hypertension:   Current medications: olmesartan -amlodipine -HCTZ 40-5-25 mg daily   Patient has an automated, upper arm home BP cuff, but denies checking recently   Patient denies hypotensive s/sx including dizziness, lightheadedness.      Hyperlipidemia/ASCVD Risk Reduction   Current lipid lowering medications:  - Praluent  150 mg injection every 14 days - ezetimibe  10 mg daily   Medications tried in the past:    Antiplatelet regimen: aspirin 81 mg   ASCVD History: Per review of Cardiology notes, Cardiac CTA in 2023 showed mild nonobstructive CAD    Current medication access support: Enrolled in [facility] for Praluent . From review of chart, apprears enrollment ends 02/29/2024   Objective:  Lab Results  Component Value Date   HGBA1C 7.1 (A) 11/15/2023    Lab Results  Component Value Date   CREATININE 1.21 (H) 09/10/2023   BUN 15 09/10/2023   NA 138 09/10/2023   K 3.4 (L) 09/10/2023   CL 103 09/10/2023   CO2 27 09/10/2023    Lab Results  Component Value Date   CHOL 156 07/11/2023   HDL 55 07/11/2023   LDLCALC 85 07/11/2023   TRIG 72 07/11/2023   CHOLHDL 2.8 07/11/2023   BP Readings from Last 3 Encounters:  11/15/23 118/68  09/24/23  122/82  09/10/23 (!) 142/81   Pulse Readings from Last 3 Encounters:  11/15/23 90  09/10/23 82  07/29/23 97     Medications Reviewed Today     Reviewed by Ardis Becton, RPH-CPP (Pharmacist) on 11/27/23 at 1021  Med List Status: <None>   Medication Order Taking? Sig Documenting Provider Last Dose Status Informant  acetaminophen  (TYLENOL ) 500 MG tablet [id number]  Take  500 mg by mouth every 6 (six) hours as needed. [provider]  Active   Alirocumab  (PRALUENT ) 150 MG/ML SOAJ [id number]  Inject 1 mL (150 mg total) into the skin every 14 (fourteen) days. Sowles, Krichna, MD  Active   aspirin 81 MG tablet [id number]  Take 81 mg by mouth daily. [provider]  Active Self, Pharmacy Records, Multiple Informants, Other           Med Note Alford Angus Jun 27, 2020  3:41 PM)    Calcium  Carb-Cholecalciferol (CALCIUM  500+D3 PO) [id number]  Take 1 tablet by mouth daily. [provider]  Active Pharmacy Records, Multiple Informants, Self, Other  Cholecalciferol (VITAMIN D3) 1000 units CAPS [id number]  Take 1,000 Units by mouth daily. [provider]  Active Self, Pharmacy Records, Multiple Informants, Other  clindamycin (CLEOCIN T) 1 % external solution [id number]  Apply 1 Application topically every morning. [provider]  Active   cyanocobalamin  (VITAMIN B12) 1000 MCG tablet [id number]  Take 1,000 mcg by mouth daily. [provider]  Active   dapagliflozin  propanediol (FARXIGA ) 10 MG TABS tablet [id number] Yes Take 1 tablet (10 mg total) by mouth daily. Through [employer] Patient Assistance Sowles, Krichna, MD Taking Active   diclofenac  Sodium (VOLTAREN ) 1 % GEL [id number]  Apply 2 g topically 4 (four) times daily as needed. Sowles, Krichna, MD  Active Pharmacy Records, Multiple Informants, Self, Other  exemestane  (AROMASIN ) 25 MG tablet [id number]  Take 1 tablet (25 mg total) by mouth daily. Timmy Forbes, MD  Active   ezetimibe  (ZETIA ) 10 MG tablet [id number]  Take 1 tablet (10 mg total) by mouth daily. Sowles, Krichna, MD  Active   glucose blood test strip [id number]   [provider]  Active Self, Pharmacy Records, Multiple Informants, Other           Med Note Alford Angus Jun 27, 2020  3:42 PM)    levothyroxine  (SYNTHROID ) 125 MCG tablet [id number]  TAKE 1 TABLET BY MOUTH DAILY Sowles, Krichna, MD  Active    Olmesartan -amLODIPine -HCTZ 40-5-25 MG TABS [id number]  Take 1 tablet by mouth daily at 12 noon. Sowles, Krichna, MD  Active   Semaglutide  (RYBELSUS ) 14 MG TABS [id number] Yes Take 1 tablet (14 mg total) by mouth daily at 6 (six) AM. Through [employer] Patient Assistance [place], Krichna, MD Taking Active   tretinoin (RETIN-A) 0.025 % cream [id number]  Apply 1 Application topically daily. [provider]  Active               Assessment/Plan:   Diabetes: - Currently controlled - Reviewed long term cardiovascular and renal outcomes of uncontrolled blood sugar - Patient to have regular well-balanced meals and snacks throughout the day, while controlling carbohydrate portion sizes - Outreach to [employer] patient assistance program today on behalf of patient. Find that patient's re-enrollment application was approved through 11/11/2024. Next refill expected to ship 12/21/2023. - Collaborating with PCP and CPhT to aid patient with re-enrollment in patient assistance for Rybelsus  from [employer] for 2025  Will ask CPhT to re-mail this application to patient     Hypertension: - Recommend to monitor home blood pressure, keep log of results and have this record to review at upcoming medical appointments. Patient to contact provider office sooner if needed for readings outside of established parameters or symptoms     Hyperlipidemia/ASCVD Risk Reduction: - Encourage patient to continue to take Praluent  and ezetimibe  as directed - Will plan to follow up again to assist patient with re-enrollment in [employer] grant before current grant enrollment ends ~02/29/2024       Follow Up Plan: Clinical Pharmacist will follow up with patient by telephone on 12/25/2023 at 10:00 AM     Arthur Lash, PharmD, [facility] [phone]

## 2023-11-27 NOTE — Patient Instructions (Signed)
 Goals Addressed             This Visit's Progress    Pharmacy Goals       Please watch the mail for an envelope from [facility] containing the patient assistance program application. Please complete this application and bring to office to have it faxed back to Attention: Linward Foster at Fax # [phone] along with a copy of your Medicare Part D prescription card and a copy of your proof of income document.   If you need to call Joni Reining, you can reach her at [phone].   If you need to reach out to patient assistance programs regarding refills or to find out the status of your application, you can do so by calling:   [employer] at [phone] [employer] at [phone]   Thank you!   Estelle Grumbles, PharmD, [facility] [phone]  Our goal A1c is less than 7%. This corresponds with fasting sugars less than 130 and 2 hour after meal sugars less than 180. Please keep a log of your results when checking your blood sugar    Check your blood pressure once weekly, and any time you have concerning symptoms like headache, chest pain, dizziness, shortness of breath, or vision changes.   Our goal is less than 130/80.  To appropriately check your blood pressure, make sure you do the following:  1) Avoid caffeine, exercise, or tobacco products for 30 minutes before checking. Empty your bladder. 2) Sit with your back supported in a flat-backed chair. Rest your arm on something flat (arm of the chair, table, etc). 3) Sit still with your feet flat on the floor, resting, for at least 5 minutes.  4) Check your blood pressure. Take 1-2 readings.  5) Write down these readings and bring with you to any provider appointments.  Bring your home blood pressure machine with you to a provider's office for accuracy comparison at least once a year.   Make sure you take your blood pressure medications before you come to any office visit, even if you were asked to  fast for labs.     Thank you!   Estelle Grumbles, PharmD, [facility] [phone]

## 2023-11-28 ENCOUNTER — Ambulatory Visit: Payer: Medicare HMO | Admitting: Physical Therapy

## 2023-11-28 ENCOUNTER — Ambulatory Visit: Payer: Medicare HMO | Admitting: Neurosurgery

## 2023-11-28 DIAGNOSIS — M5442 Lumbago with sciatica, left side: Secondary | ICD-10-CM | POA: Diagnosis not present

## 2023-11-28 DIAGNOSIS — G8929 Other chronic pain: Secondary | ICD-10-CM | POA: Diagnosis not present

## 2023-11-28 DIAGNOSIS — M5459 Other low back pain: Secondary | ICD-10-CM | POA: Diagnosis not present

## 2023-11-28 DIAGNOSIS — M5416 Radiculopathy, lumbar region: Secondary | ICD-10-CM | POA: Diagnosis not present

## 2023-11-28 DIAGNOSIS — M6281 Muscle weakness (generalized): Secondary | ICD-10-CM | POA: Diagnosis not present

## 2023-11-28 DIAGNOSIS — M4316 Spondylolisthesis, lumbar region: Secondary | ICD-10-CM | POA: Diagnosis not present

## 2023-11-28 NOTE — Therapy (Signed)
OUTPATIENT PHYSICAL THERAPY THORACOLUMBAR TREATMENT    Patient Name: Andrea Randolph MRN: [id number] DOB:Jun 10, 1956, 68 y.o., female Today's Date: 11/28/2023  END OF SESSION:  PT End of Session - 11/28/23 1652     Visit Number 2    Number of Visits 13    Date for PT Re-Evaluation 01/07/24    Authorization Type [employer] 2025    Authorization - Visit Number 2    Authorization - Number of Visits 2    Progress Note Due on Visit 10    PT Start Time 1650    PT Stop Time 1730    PT Time Calculation (min) 40 min    Activity Tolerance Patient tolerated treatment well    Behavior During Therapy WFL for tasks assessed/performed              Past Medical History:  Diagnosis Date   Allergy    Anemia    Arthritis of right shoulder region    Dr. Katrinka Blazing   Cancer [facility])    breast cancer right side   Chronic insomnia    Diabetes mellitus without complication (HCC)    Family history of adverse reaction to anesthesia    sister with Nausea   GERD (gastroesophageal reflux disease)    Goiter    Dr. Tedd Sias   Heart murmur    Hyperlipidemia    Hypertension    Hypertensive retinopathy of left eye    Hypothyroidism, adult    Dr. Tedd Sias   Inflammation of urethra    Menopause    Microalbuminuria    DM   Mixed incontinence    Muscle cramps    Obesity    OSA (obstructive sleep apnea)    Personal history of radiation therapy    Breast Cancer Right breast   Radiculitis, lumbosacral    Vitamin D deficiency    Past Surgical History:  Procedure Laterality Date   BREAST BIOPSY Left 90's   benign core needle    BREAST BIOPSY Right 02/07/2021   [place] Bx, Q-clip, DCIS   BREAST LUMPECTOMY Right 03/06/2021   BREAST LUMPECTOMY WITH SENTINEL LYMPH NODE BIOPSY Right 03/06/2021   Procedure: BREAST LUMPECTOMY WITH SENTINEL LYMPH NODE BX;  Surgeon: Earline Mayotte, MD;  Location: [facility] ORS;  Service: General;  Laterality: Right;   COLONOSCOPY WITH PROPOFOL N/A 01/26/2019   Procedure:  COLONOSCOPY WITH PROPOFOL;  Surgeon: Toney Reil, MD;  Location: [facility] ENDOSCOPY;  Service: Gastroenterology;  Laterality: N/A;   COLONOSCOPY WITH PROPOFOL N/A 02/09/2022   Procedure: COLONOSCOPY WITH PROPOFOL;  Surgeon: Wyline Mood, MD;  Location[facility] ENDOSCOPY;  Service: Gastroenterology;  Laterality: N/A;   FOOT SURGERY Bilateral    heel spurs   JOINT REPLACEMENT     TOTAL SHOULDER REPLACEMENT Right 01/25/2015   Dr. Joice Lofts   TOTAL THYROIDECTOMY  08/01/2018   TRIGGER FINGER RELEASE Right 06/10/2023   ring finger - Dr. Rosita Kea   Patient Active Problem List   Diagnosis Date Noted   Diabetes mellitus with microalbuminuria (HCC) 11/15/2023   Chronic bilateral low back pain with left-sided sciatica 07/11/2023   Genetic testing 03/28/2022   Stage 3a chronic kidney disease (CKD) (HCC) 03/07/2022   Aromatase inhibitor use 05/30/2021   Malignant 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, estrogen receptor positive (HCC) 02/20/2021   Carpal tunnel syndrome, bilateral 02/19/2019   Trigger finger of right thumb 02/19/2019   Flexor tenosynovitis of finger 02/19/2019   Mild concentric left ventricular hypertrophy (LVH) 12/24/2017  Mild aortic stenosis by prior echocardiogram 12/24/2017   Mild aortic regurgitation 12/24/2017   Uterine fibroid 01/11/2016   Allergic rhinitis, seasonal 05/24/2015   Benign essential HTN 05/24/2015   Chronic constipation 05/24/2015   Dyslipidemia 05/24/2015   Post-surgical hypothyroidism 05/24/2015   Mixed incontinence 05/24/2015   Microalbuminuria 05/24/2015   Menopause 05/24/2015   Obstructive apnea 05/24/2015   Morbid obesity (HCC) 05/24/2015   Hypertensive retinopathy 05/24/2015   Vitamin D deficiency 05/24/2015   Status post total replacement of right shoulder 03/11/2015    PCP: Alba Cory, MD  REFERRING PROVIDER: Venetia Night, MD  REFERRING DIAG:  M43.16 (ICD-10-CM) - Spondylolisthesis of lumbar region M54.16  (ICD-10-CM) - Lumbar radiculopathy M54.42,G89.29 (ICD-10-CM) - Chronic left-sided low back pain with left-sided sciatica  Rationale for Evaluation and Treatment: Rehabilitation  THERAPY DIAG:  No diagnosis found.  ONSET DATE: chronic  SUBJECTIVE:                                                                                                                                                                                           SUBJECTIVE STATEMENT: Pt reports that she experiences left sided low back pain especially when walking longer distances. She also has ongoing knee pain. She has had several bouts of physical therapy and repeated steroid injections in her knees and low back.   PERTINENT HISTORY:  Per Dr. Lucienne Capers note (09/24/2023), "chief complaint of lower back and left leg pain.  She has numbness into her left thigh.  She also has some right sided low back pain.  She has been having pain in her back and legs for nearly 2 years.  She has had bilateral knee pain for 2 to 3 months.  Her pain can be as bad as 6 out of 10.  Activity makes it worse.  Rest improves her pain."  PAIN:  Are you having pain? No - 10/10 in L LE and back at worst   PRECAUTIONS: None  RED FLAGS: None   WEIGHT BEARING RESTRICTIONS: No  FALLS:  Has patient fallen in last 6 months? No  LIVING ENVIRONMENT: Lives with:  lives with sisters  Lives in: House/apartment Stairs: Yes: Internal: flight steps; on right going up and External: 6-7 steps; on right going up Has following equipment at home: None  OCCUPATION: retired   PLOF: Independent  PATIENT GOALS: learning techniques to help alleviate pain/discomfort    OBJECTIVE:  Note: Objective measures were completed at Evaluation unless otherwise noted.  DIAGNOSTIC FINDINGS:  EXAM: MRI LUMBAR SPINE WITHOUT CONTRAST  IMPRESSION: 1. New moderate-sized disc extrusion in the left subarticular zone at L3-4 with cephalad extension of  disc material,  moderate mass effect on the thecal sac and left L3 nerve root encroachment. 2. Slightly progressive disc degeneration and facet hypertrophy at L4-5 with persistent moderate to severe multifactorial spinal stenosis. Moderate to severe right foraminal narrowing appears slightly worse. 3. Stable mild multifactorial spinal stenosis at L1-2 and L2-3. 4. Stable mild left greater than right foraminal narrowing at L5-S1.  PATIENT SURVEYS:  FOTO 60 with goal of 26   COGNITION: Overall cognitive status: Within functional limits for tasks assessed     SENSATION: Light touch: Impaired  - numbness noted to L inner thigh (L3 dermatome)  MUSCLE LENGTH: Equal hamstring tightness bilaterally Tightness noted to B piriformis (L>R)  POSTURE: rounded shoulders and forward head  PALPATION: To be further assessed at visit #2     LUMBAR ROM:   AROM eval  Flexion WFL  Extension WFL   Right lateral flexion WFL   Left lateral flexion WFL  Right rotation WFL   Left rotation WFL    (Blank rows = not tested)   LOWER EXTREMITY MMT:    MMT Right eval Left eval  Hip flexion 4 4  Hip extension 4- 4-  Hip abduction 4 4  Hip adduction 4 4  Hip internal rotation    Hip external rotation    Knee flexion 4+ 4+  Knee extension 4+ 4+  Ankle dorsiflexion 4+ 4+  Ankle plantarflexion    Ankle inversion    Ankle eversion     (Blank rows = not tested)  LUMBAR SPECIAL TESTS:  To be assessed visit #2 Straight Leg Raise:             FADIR Negative on LLE             FABER Negative on LLE       FUNCTIONAL TESTS:  5 times sit to stand: 16.28 seconds  6 minute walk test: 1,200 ft    GAIT: Distance walked: 62' Assistive device utilized: None Level of assistance: Complete Independence Comments: no obvious deviations, self reports when fatigued that she feels that she "walks funny"   TREATMENT DATE:   11/28/23                                                                                                                               Nu-step with seat at 9 with resistance 3 - 5 min  : 1,200 ft  -RPE 5/10  -NRPS 7/10  SLR; negative   Supine Bridges with hip abduction with green band 1 x 10  Supine Bridges with hip abduction with blue band 2 x 10 -min VC to maintain hip abduction.  Reverse Marches with heel taps 3 x 5   Sahrmann level 4    PATIENT EDUCATION:  Education details: HEP, POC, goals  Person educated: Patient Education method: Medical illustrator Education comprehension: verbalized understanding and returned demonstration  HOME EXERCISE PROGRAM: Access Code: [email or web address] URL: [email or web address] Date: 11/26/2023 Prepared  by: Maylon Peppers  Exercises - Seated Piriformis Stretch  - 2-3 x daily - 5-7 x weekly - 3-5 reps - 30-60 second hold - Seated Hamstring Stretch  - 2-3 x daily - 5-7 x weekly - 3-5 reps - 30-60 second hold - Supine Lower Trunk Rotation  - 2-3 x daily - 5-7 x weekly - 10 reps - 3-5 second hold  ASSESSMENT:  CLINICAL IMPRESSION: Pt does demonstrate increased radicular pain with prolonged walking. Despite pain, pt was able to ambulate community level distances. She does show decreased hip and abdominal strength and decreased hip flexibility. Left hip pathology ruled out. She will continue to benefit from skilled therapy to address remaining deficits in order to improve quality of life and return to PLOF.   OBJECTIVE IMPAIRMENTS: Abnormal gait, decreased activity tolerance, decreased endurance, decreased mobility, difficulty walking, decreased ROM, decreased strength, hypomobility, impaired sensation, postural dysfunction, and pain.   ACTIVITY LIMITATIONS: carrying, lifting, sitting, standing, and transfers  PARTICIPATION LIMITATIONS: cleaning, laundry, and community activity  PERSONAL FACTORS: Age, Behavior pattern, Past/current experiences, Time since onset of injury/illness/exacerbation, and 3+ comorbidities: hx of  breast cancer, HTN, obesity, CKD stage 3a  are also affecting patient's functional outcome.   REHAB POTENTIAL: Good  CLINICAL DECISION MAKING: Stable/uncomplicated  EVALUATION COMPLEXITY: Low   GOALS: Goals reviewed with patient? Yes  SHORT TERM GOALS: Target date: 12/17/2023  Patient will be independent in HEP to improve strength/mobility for better functional independence with ADLs. Baseline:1/14: HEP initiated  Goal status: ONGOING    LONG TERM GOALS: Target date: 01/07/2024  Patient will increase FOTO score to equal to or greater than 64 to demonstrate statistically significant improvement in mobility and quality of life.  Baseline: 1/14: 60 Goal status: ONGOING   2.  Patient (> 46 years old) will complete five times sit to stand test in < 13 seconds indicating an increased LE strength and improved balance. Baseline: 1/14: 16.28 seconds Goal status: ONGOING   3.  Patient will increase six minute walk test distance by 200' for to improve activity tolerance and gait ability  Baseline: 1/14: 1,200 ft  Goal status: ONGOING   4.  Patient will increase BLE gross strength to 4+/5 as to improve functional strength for increased standing tolerance and increased ADL ability. Baseline: 1/14: see chart above  Goal status: ONGOING   5.  Patient will report a worst pain of 3/10 on NRPS in low back and LLE to improve tolerance with ADLs and reduced symptoms with activities. Baseline: 1/14: 10/10 pain  Goal status: ONGOING    PLAN:  PT FREQUENCY: 1-2x/week  PT DURATION: 6 weeks  PLANNED INTERVENTIONS: 97164- PT Re-evaluation, 97110-Therapeutic exercises, 97530- Therapeutic activity, 97112- Neuromuscular re-education, 97535- Self Care, 47829- Manual therapy, [id number]- Gait training, 97014- Electrical stimulation (unattended), [phone]- Electrical stimulation (manual), Patient/Family education, Dry Needling, Joint mobilization, Joint manipulation, Spinal manipulation, Spinal mobilization,  DME instructions, Cryotherapy, and Moist heat.  PLAN FOR NEXT SESSION: Review HEP and measure hip ER and IR MMT. Side Lying Quad Stretch and standing hip extension.     Johnn Hai, PT, DPT  Physical Therapist - [facility]  11/28/2023, 4:54 PM

## 2023-12-02 ENCOUNTER — Ambulatory Visit: Payer: Medicare HMO | Admitting: Physical Therapy

## 2023-12-02 ENCOUNTER — Encounter: Payer: Self-pay | Admitting: Physical Therapy

## 2023-12-02 DIAGNOSIS — M5459 Other low back pain: Secondary | ICD-10-CM

## 2023-12-02 DIAGNOSIS — G8929 Other chronic pain: Secondary | ICD-10-CM | POA: Diagnosis not present

## 2023-12-02 DIAGNOSIS — M5416 Radiculopathy, lumbar region: Secondary | ICD-10-CM | POA: Diagnosis not present

## 2023-12-02 DIAGNOSIS — M4316 Spondylolisthesis, lumbar region: Secondary | ICD-10-CM | POA: Diagnosis not present

## 2023-12-02 DIAGNOSIS — M6281 Muscle weakness (generalized): Secondary | ICD-10-CM | POA: Diagnosis not present

## 2023-12-02 DIAGNOSIS — M5442 Lumbago with sciatica, left side: Secondary | ICD-10-CM | POA: Diagnosis not present

## 2023-12-02 NOTE — Therapy (Signed)
OUTPATIENT PHYSICAL THERAPY THORACOLUMBAR TREATMENT    Patient Name: ARICIA GLEBA MRN: [id number] DOB:[birth year], 68 y.o., female Today's Date: 12/02/2023  END OF SESSION:  PT End of Session - 12/02/23 1648     Visit Number 3    Number of Visits 13    Date for PT Re-Evaluation 01/07/24    Authorization Type [employer] [id number]    Authorization - Number of Visits 3    Progress Note Due on Visit 10    PT Start Time 1648    PT Stop Time 1730    PT Time Calculation (min) 42 min    Activity Tolerance Patient tolerated treatment well    Behavior During Therapy WFL for tasks assessed/performed              Past Medical History:  Diagnosis Date   Allergy    Anemia    Arthritis of right shoulder region    Dr. Katrinka Blazing   [facility])    breast cancer right side   Chronic insomnia    Diabetes mellitus without complication (HCC)    Family history of adverse reaction to anesthesia    sister with Nausea   GERD (gastroesophageal reflux disease)    Goiter    Dr. Tedd Sias   Heart murmur    Hyperlipidemia    Hypertension    Hypertensive retinopathy of left eye    Hypothyroidism, adult    Dr. Tedd Sias   Inflammation of urethra    Menopause    Microalbuminuria    DM   Mixed incontinence    Muscle cramps    Obesity    OSA (obstructive sleep apnea)    Personal history of radiation therapy    Breast Cancer Right breast   Radiculitis, lumbosacral    Vitamin D deficiency    Past Surgical History:  Procedure Laterality Date   BREAST BIOPSY Left 90's   benign core needle    BREAST BIOPSY Right 02/07/2021   [place] Bx, Q-clip, DCIS   BREAST LUMPECTOMY Right 03/06/2021   BREAST LUMPECTOMY WITH SENTINEL LYMPH NODE BIOPSY Right 03/06/2021   Procedure: BREAST LUMPECTOMY WITH SENTINEL LYMPH NODE BX;  Surgeon: Earline Mayotte, MD;  Location: [facility] ORS;  Service: General;  Laterality: Right;   COLONOSCOPY WITH PROPOFOL N/A 01/26/2019   Procedure: COLONOSCOPY WITH PROPOFOL;  Surgeon: Toney Reil, MD;  Location: [facility] ENDOSCOPY;  Service: Gastroenterology;  Laterality: N/A;   COLONOSCOPY WITH PROPOFOL N/A 02/09/2022   Procedure: COLONOSCOPY WITH PROPOFOL;  Surgeon: Wyline Mood, MD;  Location: [facility] ENDOSCOPY;  Service: Gastroenterology;  Laterality: N/A;   FOOT SURGERY Bilateral    heel spurs   JOINT REPLACEMENT     TOTAL SHOULDER REPLACEMENT Right 01/25/2015   Dr. Joice Lofts   TOTAL THYROIDECTOMY  08/01/2018   TRIGGER FINGER RELEASE Right 06/10/2023   ring finger - Dr. Rosita Kea   Patient Active Problem List   Diagnosis Date Noted   Diabetes mellitus with microalbuminuria (HCC) 11/15/2023   Chronic bilateral low back pain with left-sided sciatica 07/11/2023   Genetic testing 03/28/2022   Stage 3a chronic kidney disease (CKD) (HCC) 03/07/2022   Aromatase inhibitor use 05/30/2021   Malignant 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, estrogen receptor positive (HCC) 02/20/2021   Carpal tunnel syndrome, bilateral 02/19/2019   Trigger finger of right thumb 02/19/2019   Flexor tenosynovitis of finger 02/19/2019   Mild concentric left ventricular hypertrophy (LVH) 12/24/2017   Mild aortic stenosis by prior echocardiogram 12/24/2017  Mild aortic regurgitation 12/24/2017   Uterine fibroid 01/11/2016   Allergic rhinitis, seasonal 05/24/2015   Benign essential HTN 05/24/2015   Chronic constipation 05/24/2015   Dyslipidemia 05/24/2015   Post-surgical hypothyroidism 05/24/2015   Mixed incontinence 05/24/2015   Microalbuminuria 05/24/2015   Menopause 05/24/2015   Obstructive apnea 05/24/2015   Morbid obesity (HCC) 05/24/2015   Hypertensive retinopathy 05/24/2015   Vitamin D deficiency 05/24/2015   Status post total replacement of right shoulder 03/11/2015    PCP: Alba Cory, MD  REFERRING PROVIDER: Venetia Night, MD  REFERRING DIAG:  M43.16 (ICD-10-CM) - Spondylolisthesis of lumbar region M54.16 (ICD-10-CM) - Lumbar radiculopathy M54.42,G89.29  (ICD-10-CM) - Chronic left-sided low back pain with left-sided sciatica  Rationale for Evaluation and Treatment: Rehabilitation  THERAPY DIAG:  Other low back pain  Radiculopathy, lumbar region  Muscle weakness (generalized)  ONSET DATE: chronic  SUBJECTIVE:                                                                                                                                                                                           SUBJECTIVE STATEMENT: Pt states that her exercises have been going well with the exception of doing bridges with hip abduction.   PERTINENT HISTORY:  Per Dr. Lucienne Capers note (09/24/2023), "chief complaint of lower back and left leg pain.  She has numbness into her left thigh.  She also has some right sided low back pain.  She has been having pain in her back and legs for nearly 2 years.  She has had bilateral knee pain for 2 to 3 months.  Her pain can be as bad as 6 out of 10.  Activity makes it worse.  Rest improves her pain."  PAIN:  Are you having pain? No - 10/10 in L LE and back at worst   PRECAUTIONS: None  RED FLAGS: None   WEIGHT BEARING RESTRICTIONS: No  FALLS:  Has patient fallen in last 6 months? No  LIVING ENVIRONMENT: Lives with:  lives with sisters  Lives in: House/apartment Stairs: Yes: Internal: flight steps; on right going up and External: 6-7 steps; on right going up Has following equipment at home: None  OCCUPATION: retired   PLOF: Independent  PATIENT GOALS: learning techniques to help alleviate pain/discomfort    OBJECTIVE:  Note: Objective measures were completed at Evaluation unless otherwise noted.  DIAGNOSTIC FINDINGS:  EXAM: MRI LUMBAR SPINE WITHOUT CONTRAST  IMPRESSION: 1. New moderate-sized disc extrusion in the left subarticular zone at L3-4 with cephalad extension of disc material, moderate mass effect on the thecal sac and left L3 nerve root encroachment. 2. Slightly progressive disc  degeneration  and facet hypertrophy at L4-5 with persistent moderate to severe multifactorial spinal stenosis. Moderate to severe right foraminal narrowing appears slightly worse. 3. Stable mild multifactorial spinal stenosis at L1-2 and L2-3. 4. Stable mild left greater than right foraminal narrowing at L5-S1.  PATIENT SURVEYS:  FOTO 60 with goal of 29   COGNITION: Overall cognitive status: Within functional limits for tasks assessed     SENSATION: Light touch: Impaired  - numbness noted to L inner thigh (L3 dermatome)  MUSCLE LENGTH: Equal hamstring tightness bilaterally Tightness noted to B piriformis (L>R)  POSTURE: rounded shoulders and forward head  PALPATION: To be further assessed at visit #2     LUMBAR ROM:   AROM eval  Flexion WFL  Extension WFL   Right lateral flexion WFL   Left lateral flexion WFL  Right rotation WFL   Left rotation WFL    (Blank rows = not tested)   LOWER EXTREMITY MMT:    MMT Right eval Left eval  Hip flexion 4 4  Hip extension 4- 4-  Hip abduction 4 4  Hip adduction 4 4  Hip internal rotation 4 4  Hip external rotation 4 4  Knee flexion 4+ 4+  Knee extension 4+ 4+  Ankle dorsiflexion 4+ 4+  Ankle plantarflexion    Ankle inversion    Ankle eversion     (Blank rows = not tested)  LUMBAR SPECIAL TESTS:  To be assessed visit #2 Straight Leg Raise:             FADIR Negative on LLE             FABER Negative on LLE       FUNCTIONAL TESTS:  5 times sit to stand: 16.28 seconds  6 minute walk test: 1,200 ft    GAIT: Distance walked: 89' Assistive device utilized: None Level of assistance: Complete Independence Comments: no obvious deviations, self reports when fatigued that she feels that she "walks funny"   TREATMENT DATE:   12/02/23: Nu-step with seat at 9 with resistance 3 - 5 min  Hip ER R/L 4/4  Hip IR R/L 4/4  Side Lying Hip Abduction 1 x 10  -Pt is unable to not externally rotate hips to maintain hip  rotation  Clam Shell with green band 3 x 10  Standing Hip Extension with BUE support 2 x 10 Supine Abdominal Reach 3 x 10  Nu-Step with seat and arms at 9 with 81 SPM with resistance at 3 for 5 min  -Song test to gauge intensity    11/28/23                                                                                                                              Nu-step with seat at 9 with resistance 3 - 5 min  : 1,200 ft  -RPE 5/10  -NRPS 7/10  SLR; negative   Supine Bridges with hip abduction with green band  1 x 10  Supine Bridges with hip abduction with blue band 2 x 10 -min VC to maintain hip abduction.  Reverse Marches with heel taps 3 x 5   Sahrmann level 4    PATIENT EDUCATION:  Education details: HEP, POC, goals  Person educated: Patient Education method: Medical illustrator Education comprehension: verbalized understanding and returned demonstration  HOME EXERCISE PROGRAM: Access Code: [email or web address] URL: [email or web address] Date: 12/02/2023 Prepared by: Ellin Goodie  Exercises - Seated Piriformis Stretch  - 2-3 x daily - 3-4 x weekly - 3 reps - 60 second hold - Seated Hamstring Stretch  - 1 x daily - 3-4 x weekly - 3 reps - 60 seccond hold - Supine Lower Trunk Rotation  - 2-3 x daily - 7 x weekly - 10 reps - Supine 90/90 Alternating Toe Touch  - 3-4 x weekly - 3 sets - 5 reps - Clamshell with Resistance  - 3-4 x weekly - 3 sets - 10 reps - Ab Prep  - 3-4 x weekly - 3 sets - 10 reps  ASSESSMENT:  CLINICAL IMPRESSION: Pt shows ongoing hip weakness. She was able to perform all exercises without provocation of her lumbar radicular symptoms. HEP modified to bridges with hip abduction to two separate exercises including clam shells and standing hip extension. She was able to perform this exercise with less verbal and tactile cueing than original bridges. PT introduced pt to aerobic exercise and she was able to demonstrate how to measure  intensity using song test. She will continue to benefit from skilled therapy to address remaining deficits in order to improve quality of life and return to PLOF.   OBJECTIVE IMPAIRMENTS: Abnormal gait, decreased activity tolerance, decreased endurance, decreased mobility, difficulty walking, decreased ROM, decreased strength, hypomobility, impaired sensation, postural dysfunction, and pain.   ACTIVITY LIMITATIONS: carrying, lifting, sitting, standing, and transfers  PARTICIPATION LIMITATIONS: cleaning, laundry, and community activity  PERSONAL FACTORS: Age, Behavior pattern, Past/current experiences, Time since onset of injury/illness/exacerbation, and 3+ comorbidities: hx of breast cancer, HTN, obesity, CKD stage 3a  are also affecting patient's functional outcome.   REHAB POTENTIAL: Good  CLINICAL DECISION MAKING: Stable/uncomplicated  EVALUATION COMPLEXITY: Low   GOALS: Goals reviewed with patient? Yes  SHORT TERM GOALS: Target date: 12/17/2023  Patient will be independent in HEP to improve strength/mobility for better functional independence with ADLs. Baseline:1/14: HEP initiated  Goal status: ONGOING    LONG TERM GOALS: Target date: 01/07/2024  Patient will increase FOTO score to equal to or greater than 64 to demonstrate statistically significant improvement in mobility and quality of life.  Baseline: 1/14: 60 Goal status: ONGOING   2.  Patient (> 44 years old) will complete five times sit to stand test in < 13 seconds indicating an increased LE strength and improved balance. Baseline: 1/14: 16.28 seconds Goal status: ONGOING   3.  Patient will increase six minute walk test distance by 200' for to improve activity tolerance and gait ability  Baseline: 1/14: 1,200 ft  Goal status: ONGOING   4.  Patient will increase BLE gross strength to 4+/5 as to improve functional strength for increased standing tolerance and increased ADL ability. Baseline: 1/14: see chart above   Goal status: ONGOING   5.  Patient will report a worst pain of 3/10 on NRPS in low back and LLE to improve tolerance with ADLs and reduced symptoms with activities. Baseline: 1/14: 10/10 pain  Goal status: ONGOING    PLAN:  PT FREQUENCY: 1-2x/week  PT  DURATION: 6 weeks  PLANNED INTERVENTIONS: 97164- PT Re-evaluation, 97110-Therapeutic exercises, 97530- Therapeutic activity, [id number]- Neuromuscular re-education, 97535- Self Care, 16109- Manual therapy, [id number]- Gait training, 97014- Electrical stimulation (unattended), [phone]- Electrical stimulation (manual), Patient/Family education, Dry Needling, Joint mobilization, Joint manipulation, Spinal manipulation, Spinal mobilization, DME instructions, Cryotherapy, and Moist heat.  PLAN FOR NEXT SESSION: Recumbent Bicycle for Aerobic Exercise and use of walk and talk test to measure intensity. Banded side steps and/or monster walks    Johnn Hai, PT, DPT  Physical Therapist - [facility]  [facility]  12/02/2023, 4:48 PM

## 2023-12-04 ENCOUNTER — Encounter: Payer: Self-pay | Admitting: Physical Therapy

## 2023-12-04 ENCOUNTER — Ambulatory Visit: Payer: Medicare HMO | Admitting: Physical Therapy

## 2023-12-04 DIAGNOSIS — M5459 Other low back pain: Secondary | ICD-10-CM

## 2023-12-04 DIAGNOSIS — M5416 Radiculopathy, lumbar region: Secondary | ICD-10-CM

## 2023-12-04 DIAGNOSIS — G8929 Other chronic pain: Secondary | ICD-10-CM | POA: Diagnosis not present

## 2023-12-04 DIAGNOSIS — M5442 Lumbago with sciatica, left side: Secondary | ICD-10-CM | POA: Diagnosis not present

## 2023-12-04 DIAGNOSIS — M6281 Muscle weakness (generalized): Secondary | ICD-10-CM

## 2023-12-04 DIAGNOSIS — M4316 Spondylolisthesis, lumbar region: Secondary | ICD-10-CM | POA: Diagnosis not present

## 2023-12-04 NOTE — Therapy (Unsigned)
OUTPATIENT PHYSICAL THERAPY THORACOLUMBAR TREATMENT    Patient Name: Andrea Randolph MRN: [id number] DOB:[birth year], 68 y.o., female Today's Date: 12/04/2023  END OF SESSION:  PT End of Session - 12/04/23 1638     Visit Number 4    Number of Visits 13    Date for PT Re-Evaluation 01/07/24    Authorization Type [employer] [id number]    Authorization - Visit Number 4    Authorization - Number of Visits 13    Progress Note Due on Visit 10    PT Start Time 1645    PT Stop Time 1725    PT Time Calculation (min) 40 min    Activity Tolerance Patient tolerated treatment well    Behavior During Therapy WFL for tasks assessed/performed              Past Medical History:  Diagnosis Date   Allergy    Anemia    Arthritis of right shoulder region    Dr. Katrinka Blazing   Cancer [facility])    breast cancer right side   Chronic insomnia    Diabetes mellitus without complication (HCC)    Family history of adverse reaction to anesthesia    sister with Nausea   GERD (gastroesophageal reflux disease)    Goiter    Dr. Tedd Sias   Heart murmur    Hyperlipidemia    Hypertension    Hypertensive retinopathy of left eye    Hypothyroidism, adult    Dr. Tedd Sias   Inflammation of urethra    Menopause    Microalbuminuria    DM   Mixed incontinence    Muscle cramps    Obesity    OSA (obstructive sleep apnea)    Personal history of radiation therapy    Breast Cancer Right breast   Radiculitis, lumbosacral    Vitamin D deficiency    Past Surgical History:  Procedure Laterality Date   BREAST BIOPSY Left 90's   benign core needle    BREAST BIOPSY Right 02/07/2021   [place] Bx, Q-clip, DCIS   BREAST LUMPECTOMY Right 03/06/2021   BREAST LUMPECTOMY WITH SENTINEL LYMPH NODE BIOPSY Right 03/06/2021   Procedure: BREAST LUMPECTOMY WITH SENTINEL LYMPH NODE BX;  Surgeon: Earline Mayotte, MD;  Location: [facility] ORS;  Service: General;  Laterality: Right;   COLONOSCOPY WITH PROPOFOL N/A 01/26/2019   Procedure:  COLONOSCOPY WITH PROPOFOL;  Surgeon: Toney Reil, MD;  Location: [facility] ENDOSCOPY;  Service: Gastroenterology;  Laterality: N/A;   COLONOSCOPY WITH PROPOFOL N/A 02/09/2022   Procedure: COLONOSCOPY WITH PROPOFOL;  Surgeon: Wyline Mood, MD;  Location: [facility] ENDOSCOPY;  Service: Gastroenterology;  Laterality: N/A;   FOOT SURGERY Bilateral    heel spurs   JOINT REPLACEMENT     TOTAL SHOULDER REPLACEMENT Right 01/25/2015   Dr. Joice Lofts   TOTAL THYROIDECTOMY  08/01/2018   TRIGGER FINGER RELEASE Right 06/10/2023   ring finger - Dr. Rosita Kea   Patient Active Problem List   Diagnosis Date Noted   Diabetes mellitus with microalbuminuria (HCC) 11/15/2023   Chronic bilateral low back pain with left-sided sciatica 07/11/2023   Genetic testing 03/28/2022   Stage 3a chronic kidney disease (CKD) (HCC) 03/07/2022   Aromatase inhibitor use 05/30/2021   Malignant 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, estrogen receptor positive (HCC) 02/20/2021   Carpal tunnel syndrome, bilateral 02/19/2019   Trigger finger of right thumb 02/19/2019   Flexor tenosynovitis of finger 02/19/2019   Mild concentric left ventricular hypertrophy (LVH) 12/24/2017  Mild aortic stenosis by prior echocardiogram 12/24/2017   Mild aortic regurgitation 12/24/2017   Uterine fibroid 01/11/2016   Allergic rhinitis, seasonal 05/24/2015   Benign essential HTN 05/24/2015   Chronic constipation 05/24/2015   Dyslipidemia 05/24/2015   Post-surgical hypothyroidism 05/24/2015   Mixed incontinence 05/24/2015   Microalbuminuria 05/24/2015   Menopause 05/24/2015   Obstructive apnea 05/24/2015   Morbid obesity (HCC) 05/24/2015   Hypertensive retinopathy 05/24/2015   Vitamin D deficiency 05/24/2015   Status post total replacement of right shoulder 03/11/2015    PCP: Alba Cory, MD  REFERRING PROVIDER: Venetia Night, MD  REFERRING DIAG:  M43.16 (ICD-10-CM) - Spondylolisthesis of lumbar region M54.16  (ICD-10-CM) - Lumbar radiculopathy M54.42,G89.29 (ICD-10-CM) - Chronic left-sided low back pain with left-sided sciatica  Rationale for Evaluation and Treatment: Rehabilitation  THERAPY DIAG:  Other low back pain  Radiculopathy, lumbar region  Muscle weakness (generalized)  ONSET DATE: chronic  SUBJECTIVE:                                                                                                                                                                                           SUBJECTIVE STATEMENT: Pt states that she was feeling some discomfort because she was busy the past two days doing a bunch of lifting and moving stuff around. She says her neck has started bothering her within the past couple days but will try using some ice and heat.  PERTINENT HISTORY:  Per Dr. Lucienne Capers note (09/24/2023), "chief complaint of lower back and left leg pain.  She has numbness into her left thigh.  She also has some right sided low back pain.  She has been having pain in her back and legs for nearly 2 years.  She has had bilateral knee pain for 2 to 3 months.  Her pain can be as bad as 6 out of 10.  Activity makes it worse.  Rest improves her pain."  PAIN:  Are you having pain? No - 10/10 in L LE and back at worst   PRECAUTIONS: None  RED FLAGS: None   WEIGHT BEARING RESTRICTIONS: No  FALLS:  Has patient fallen in last 6 months? No  LIVING ENVIRONMENT: Lives with:  lives with sisters  Lives in: House/apartment Stairs: Yes: Internal: flight steps; on right going up and External: 6-7 steps; on right going up Has following equipment at home: None  OCCUPATION: retired   PLOF: Independent  PATIENT GOALS: learning techniques to help alleviate pain/discomfort    OBJECTIVE:  Note: Objective measures were completed at Evaluation unless otherwise noted.  DIAGNOSTIC FINDINGS:  EXAM: MRI LUMBAR SPINE WITHOUT CONTRAST  IMPRESSION: 1.  New moderate-sized disc extrusion in the  left subarticular zone at L3-4 with cephalad extension of disc material, moderate mass effect on the thecal sac and left L3 nerve root encroachment. 2. Slightly progressive disc degeneration and facet hypertrophy at L4-5 with persistent moderate to severe multifactorial spinal stenosis. Moderate to severe right foraminal narrowing appears slightly worse. 3. Stable mild multifactorial spinal stenosis at L1-2 and L2-3. 4. Stable mild left greater than right foraminal narrowing at L5-S1.  PATIENT SURVEYS:  FOTO 60 with goal of 20   COGNITION: Overall cognitive status: Within functional limits for tasks assessed     SENSATION: Light touch: Impaired  - numbness noted to L inner thigh (L3 dermatome)  MUSCLE LENGTH: Equal hamstring tightness bilaterally Tightness noted to B piriformis (L>R)  POSTURE: rounded shoulders and forward head  PALPATION: To be further assessed at visit #2     LUMBAR ROM:   AROM eval  Flexion WFL  Extension WFL   Right lateral flexion WFL   Left lateral flexion WFL  Right rotation WFL   Left rotation WFL    (Blank rows = not tested)   LOWER EXTREMITY MMT:    MMT Right eval Left eval  Hip flexion 4 4  Hip extension 4- 4-  Hip abduction 4 4  Hip adduction 4 4  Hip internal rotation 4 4  Hip external rotation 4 4  Knee flexion 4+ 4+  Knee extension 4+ 4+  Ankle dorsiflexion 4+ 4+  Ankle plantarflexion    Ankle inversion    Ankle eversion     (Blank rows = not tested)  LUMBAR SPECIAL TESTS:  To be assessed visit #2 Straight Leg Raise:             FADIR Negative on LLE             FABER Negative on LLE       FUNCTIONAL TESTS:  5 times sit to stand: 16.28 seconds  6 minute walk test: 1,200 ft    GAIT: Distance walked: 34' Assistive device utilized: None Level of assistance: Complete Independence Comments: no obvious deviations, self reports when fatigued that she feels that she "walks funny"   TREATMENT DATE:    12/04/23: Recumbent bike for 3 minutes with seat 10 with resistance 3  Recumbent bike for 5 minutes for intensity with resistance 3 Side stepping with red band 6x10 ft  -pt took seated break after 4th set Marches with BUE support 1x10  Marches with BUE support with #3 AW 1x10  Marches with BUE support with #5 AW 1x10  Squats to chair with Airex pad 3x10  12/02/23: Nu-step with seat at 9 with resistance 3 - 5 min  Hip ER R/L 4/4  Hip IR R/L 4/4  Side Lying Hip Abduction 1 x 10  -Pt is unable to not externally rotate hips to maintain hip rotation  Clam Shell with green band 3 x 10  Standing Hip Extension with BUE support 2 x 10 Supine Abdominal Reach 3 x 10  Nu-Step with seat and arms at 9 with 81 SPM with resistance at 3 for 5 min  -Song test to gauge intensity    11/28/23  Nu-step with seat at 9 with resistance 3 - 5 min  : 1,200 ft  -RPE 5/10  -NRPS 7/10  SLR; negative   Supine Bridges with hip abduction with green band 1 x 10  Supine Bridges with hip abduction with blue band 2 x 10 -min VC to maintain hip abduction.  Reverse Marches with heel taps 3 x 5   Sahrmann level 4    PATIENT EDUCATION:  Education details: HEP, POC, goals  Person educated: Patient Education method: Medical illustrator Education comprehension: verbalized understanding and returned demonstration  HOME EXERCISE PROGRAM: Access Code: [email or web address] URL: [email or web address] Date: 12/04/2023 Prepared by: Consuelo Pandy  Exercises - Seated Piriformis Stretch  - 2-3 x daily - 3-4 x weekly - 3 reps - 60 second hold - Seated Hamstring Stretch  - 1 x daily - 3-4 x weekly - 3 reps - 60 seccond hold - Supine Lower Trunk Rotation  - 2-3 x daily - 7 x weekly - 10 reps - Supine 90/90 Alternating Toe Touch  - 3-4 x weekly - 3 sets - 5 reps - Clamshell with  Resistance  - 3-4 x weekly - 3 sets - 10 reps - Ab Prep  - 3-4 x weekly - 3 sets - 10 reps - Squat with Chair Touch  - 1 x daily - 3-4 x weekly - 3 sets - 10 reps - Side Stepping with Resistance at Ankles  - 1 x daily - 3-4 x weekly - 3 sets - 10 reps  ASSESSMENT:  CLINICAL IMPRESSION: Pt shows ongoing hip weakness, but she was able to perform all of her exercises. She had some radicular symptoms at the end of the session and has discomfort since doing exercises that is consistent with delayed onset muscle soreness (DOMS). She was educated about what that is and what to expect after doing exercises. Pt performed recommended stretch and felt relief from symptoms. Pt will benefit from further aerobic exercise since she is unable to walk for longer distances. Pt will continue to benefit from skilled PT to address these deficits to help alleviate symptoms and increase function.   OBJECTIVE IMPAIRMENTS: Abnormal gait, decreased activity tolerance, decreased endurance, decreased mobility, difficulty walking, decreased ROM, decreased strength, hypomobility, impaired sensation, postural dysfunction, and pain.   ACTIVITY LIMITATIONS: carrying, lifting, sitting, standing, and transfers  PARTICIPATION LIMITATIONS: cleaning, laundry, and community activity  PERSONAL FACTORS: Age, Behavior pattern, Past/current experiences, Time since onset of injury/illness/exacerbation, and 3+ comorbidities: hx of breast cancer, HTN, obesity, CKD stage 3a  are also affecting patient's functional outcome.   REHAB POTENTIAL: Good  CLINICAL DECISION MAKING: Stable/uncomplicated  EVALUATION COMPLEXITY: Low   GOALS: Goals reviewed with patient? Yes  SHORT TERM GOALS: Target date: 12/17/2023  Patient will be independent in HEP to improve strength/mobility for better functional independence with ADLs. Baseline:1/14: HEP initiated  Goal status: ONGOING    LONG TERM GOALS: Target date: 01/07/2024  Patient will increase  FOTO score to equal to or greater than 64 to demonstrate statistically significant improvement in mobility and quality of life.  Baseline: 1/14: 60 Goal status: ONGOING   2.  Patient (> 70 years old) will complete five times sit to stand test in < 13 seconds indicating an increased LE strength and improved balance. Baseline: 1/14: 16.28 seconds Goal status: ONGOING   3.  Patient will increase six minute walk test distance by 200' for to improve activity tolerance and gait ability  Baseline: 1/14: 1,200 ft  Goal status: ONGOING  4.  Patient will increase BLE gross strength to 4+/5 as to improve functional strength for increased standing tolerance and increased ADL ability. Baseline: 1/14: see chart above  Goal status: ONGOING   5.  Patient will report a worst pain of 3/10 on NRPS in low back and LLE to improve tolerance with ADLs and reduced symptoms with activities. Baseline: 1/14: 10/10 pain  Goal status: ONGOING    PLAN:  PT FREQUENCY: 1-2x/week  PT DURATION: 6 weeks  PLANNED INTERVENTIONS: 97164- PT Re-evaluation, 97110-Therapeutic exercises, 97530- Therapeutic activity, 97112- Neuromuscular re-education, 97535- Self Care, 29562- Manual therapy, [id number]- Gait training, 97014- Electrical stimulation (unattended), [phone]- Electrical stimulation (manual), Patient/Family education, Dry Needling, Joint mobilization, Joint manipulation, Spinal manipulation, Spinal mobilization, DME instructions, Cryotherapy, and Moist heat.  PLAN FOR NEXT SESSION: Continue with core strengthening exercises and leg exercises. Leg press. Longer time on recumbent bike and increase intensity.   Consuelo Pandy, [place], DPT  Physical Therapist - [facility]  [facility]  12/04/2023, 7:22 PM

## 2023-12-05 ENCOUNTER — Telehealth: Payer: Self-pay | Admitting: Pharmacy Technician

## 2023-12-05 NOTE — Telephone Encounter (Signed)
PAP: Patient assistance application for [employer] through [employer] has been mailed to pt's home address on file. Provider portion of application will be faxed to provider's office for nicole see other encounter from 10/15/23

## 2023-12-05 NOTE — Telephone Encounter (Signed)
PAP: Patient assistance application for [employer] through [employer] has been mailed to pt's home address on file. Provider portion of application will be faxed to provider's office.

## 2023-12-05 NOTE — Telephone Encounter (Signed)
PT DID NOT GET FIRST PAP APP FOR [employer] FOR [employer] SENT NEW APP OUT TO PT ADDRESS      PLEASE BE ADVISED

## 2023-12-10 ENCOUNTER — Ambulatory Visit: Payer: Medicare HMO | Admitting: Physical Therapy

## 2023-12-10 DIAGNOSIS — M5416 Radiculopathy, lumbar region: Secondary | ICD-10-CM | POA: Diagnosis not present

## 2023-12-10 DIAGNOSIS — M4316 Spondylolisthesis, lumbar region: Secondary | ICD-10-CM | POA: Diagnosis not present

## 2023-12-10 DIAGNOSIS — M6281 Muscle weakness (generalized): Secondary | ICD-10-CM

## 2023-12-10 DIAGNOSIS — M5442 Lumbago with sciatica, left side: Secondary | ICD-10-CM | POA: Diagnosis not present

## 2023-12-10 DIAGNOSIS — G8929 Other chronic pain: Secondary | ICD-10-CM | POA: Diagnosis not present

## 2023-12-10 DIAGNOSIS — M5459 Other low back pain: Secondary | ICD-10-CM

## 2023-12-10 NOTE — Therapy (Signed)
OUTPATIENT PHYSICAL THERAPY THORACOLUMBAR TREATMENT    Patient Name: Andrea Randolph MRN: [id number] DOB:15-May-1956, 68 y.o., female Today's Date: 12/10/2023  END OF SESSION:  PT End of Session - 12/10/23 1655     Visit Number 5    Number of Visits 13    Date for PT Re-Evaluation 01/07/24    Authorization Type [employer] [id number]    Authorization - Visit Number 5    Authorization - Number of Visits 13    Progress Note Due on Visit 10    PT Start Time 1650    PT Stop Time 1730    PT Time Calculation (min) 40 min    Activity Tolerance Patient tolerated treatment well    Behavior During Therapy WFL for tasks assessed/performed               Past Medical History:  Diagnosis Date   Allergy    Anemia    Arthritis of right shoulder region    Dr. Katrinka Blazing   Cancer [facility])    breast cancer right side   Chronic insomnia    Diabetes mellitus without complication (HCC)    Family history of adverse reaction to anesthesia    sister with Nausea   GERD (gastroesophageal reflux disease)    Goiter    Dr. Tedd Sias   Heart murmur    Hyperlipidemia    Hypertension    Hypertensive retinopathy of left eye    Hypothyroidism, adult    Dr. Tedd Sias   Inflammation of urethra    Menopause    Microalbuminuria    DM   Mixed incontinence    Muscle cramps    Obesity    OSA (obstructive sleep apnea)    Personal history of radiation therapy    Breast Cancer Right breast   Radiculitis, lumbosacral    Vitamin D deficiency    Past Surgical History:  Procedure Laterality Date   BREAST BIOPSY Left 90's   benign core needle    BREAST BIOPSY Right 02/07/2021   [place] Bx, Q-clip, DCIS   BREAST LUMPECTOMY Right 03/06/2021   BREAST LUMPECTOMY WITH SENTINEL LYMPH NODE BIOPSY Right 03/06/2021   Procedure: BREAST LUMPECTOMY WITH SENTINEL LYMPH NODE BX;  Surgeon: Earline Mayotte, MD;  Location: [facility] ORS;  Service: General;  Laterality: Right;   COLONOSCOPY WITH PROPOFOL N/A 01/26/2019   Procedure:  COLONOSCOPY WITH PROPOFOL;  Surgeon: Toney Reil, MD;  Location: [facility] ENDOSCOPY;  Service: Gastroenterology;  Laterality: N/A;   COLONOSCOPY WITH PROPOFOL N/A 02/09/2022   Procedure: COLONOSCOPY WITH PROPOFOL;  Surgeon: Wyline Mood, MD;  Location: [facility] ENDOSCOPY;  Service: Gastroenterology;  Laterality: N/A;   FOOT SURGERY Bilateral    heel spurs   JOINT REPLACEMENT     TOTAL SHOULDER REPLACEMENT Right 01/25/2015   Dr. Joice Lofts   TOTAL THYROIDECTOMY  08/01/2018   TRIGGER FINGER RELEASE Right 06/10/2023   ring finger - Dr. Rosita Kea   Patient Active Problem List   Diagnosis Date Noted   Diabetes mellitus with microalbuminuria (HCC) 11/15/2023   Chronic bilateral low back pain with left-sided sciatica 07/11/2023   Genetic testing 03/28/2022   Stage 3a chronic kidney disease (CKD) (HCC) 03/07/2022   Aromatase inhibitor use 05/30/2021   Malignant 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, estrogen receptor positive (HCC) 02/20/2021   Carpal tunnel syndrome, bilateral 02/19/2019   Trigger finger of right thumb 02/19/2019   Flexor tenosynovitis of finger 02/19/2019   Mild concentric left ventricular hypertrophy (LVH) 12/24/2017  Mild aortic stenosis by prior echocardiogram 12/24/2017   Mild aortic regurgitation 12/24/2017   Uterine fibroid 01/11/2016   Allergic rhinitis, seasonal 05/24/2015   Benign essential HTN 05/24/2015   Chronic constipation 05/24/2015   Dyslipidemia 05/24/2015   Post-surgical hypothyroidism 05/24/2015   Mixed incontinence 05/24/2015   Microalbuminuria 05/24/2015   Menopause 05/24/2015   Obstructive apnea 05/24/2015   Morbid obesity (HCC) 05/24/2015   Hypertensive retinopathy 05/24/2015   Vitamin D deficiency 05/24/2015   Status post total replacement of right shoulder 03/11/2015    PCP: Alba Cory, MD  REFERRING PROVIDER: Venetia Night, MD  REFERRING DIAG:  M43.16 (ICD-10-CM) - Spondylolisthesis of lumbar region M54.16  (ICD-10-CM) - Lumbar radiculopathy M54.42,G89.29 (ICD-10-CM) - Chronic left-sided low back pain with left-sided sciatica  Rationale for Evaluation and Treatment: Rehabilitation  THERAPY DIAG:  Other low back pain  Radiculopathy, lumbar region  Muscle weakness (generalized)  ONSET DATE: chronic  SUBJECTIVE:                                                                                                                                                                                           SUBJECTIVE STATEMENT: Pt reports that she has continued to perform exercises without much difficulty.   PERTINENT HISTORY:  Per Dr. Lucienne Capers note (09/24/2023), "chief complaint of lower back and left leg pain.  She has numbness into her left thigh.  She also has some right sided low back pain.  She has been having pain in her back and legs for nearly 2 years.  She has had bilateral knee pain for 2 to 3 months.  Her pain can be as bad as 6 out of 10.  Activity makes it worse.  Rest improves her pain."  PAIN:  Are you having pain? Yes: NPRS scale: 5-6/10 Pain location: Central spinous process of low back  Pain description: Achy  Aggravating factors: Walking or standing for a long time  Relieving factors: Sitting down     PRECAUTIONS: None  RED FLAGS: None   WEIGHT BEARING RESTRICTIONS: No  FALLS:  Has patient fallen in last 6 months? No  LIVING ENVIRONMENT: Lives with:  lives with sisters  Lives in: House/apartment Stairs: Yes: Internal: flight steps; on right going up and External: 6-7 steps; on right going up Has following equipment at home: None  OCCUPATION: retired   PLOF: Independent  PATIENT GOALS: learning techniques to help alleviate pain/discomfort    OBJECTIVE:  Note: Objective measures were completed at Evaluation unless otherwise noted.  DIAGNOSTIC FINDINGS:  EXAM: MRI LUMBAR SPINE WITHOUT CONTRAST  IMPRESSION: 1. New moderate-sized disc extrusion in the left  subarticular zone  at L3-4 with cephalad extension of disc material, moderate mass effect on the thecal sac and left L3 nerve root encroachment. 2. Slightly progressive disc degeneration and facet hypertrophy at L4-5 with persistent moderate to severe multifactorial spinal stenosis. Moderate to severe right foraminal narrowing appears slightly worse. 3. Stable mild multifactorial spinal stenosis at L1-2 and L2-3. 4. Stable mild left greater than right foraminal narrowing at L5-S1.  PATIENT SURVEYS:  FOTO 60 with goal of 68   COGNITION: Overall cognitive status: Within functional limits for tasks assessed     SENSATION: Light touch: Impaired  - numbness noted to L inner thigh (L3 dermatome)  MUSCLE LENGTH: Equal hamstring tightness bilaterally Tightness noted to B piriformis (L>R)  POSTURE: rounded shoulders and forward head  PALPATION: To be further assessed at visit #2     LUMBAR ROM:   AROM eval  Flexion WFL  Extension WFL   Right lateral flexion WFL   Left lateral flexion WFL  Right rotation WFL   Left rotation WFL    (Blank rows = not tested)   LOWER EXTREMITY MMT:    MMT Right eval Left eval  Hip flexion 4 4  Hip extension 4- 4-  Hip abduction 4 4  Hip adduction 4 4  Hip internal rotation 4 4  Hip external rotation 4 4  Knee flexion 4+ 4+  Knee extension 4+ 4+  Ankle dorsiflexion 4+ 4+  Ankle plantarflexion    Ankle inversion    Ankle eversion     (Blank rows = not tested)  LUMBAR SPECIAL TESTS:  To be assessed visit #2 Straight Leg Raise:             FADIR Negative on LLE             FABER Negative on LLE       FUNCTIONAL TESTS:  5 times sit to stand: 16.28 seconds  6 minute walk test: 1,200 ft    GAIT: Distance walked: 82' Assistive device utilized: None Level of assistance: Complete Independence Comments: no obvious deviations, self reports when fatigued that she feels that she "walks funny"   TREATMENT DATE:   12/10/23: Recumbent  bike for 5 minutes with seat at 10 and resistance at 3 for 6 min Bird Dog 2 x 10  Bird Dog 2 x 5 - Pt unable to fully extend hips with knees straight  Bird Dog with bent knee 1 x 10  OMEGA Palloff Press with #5 4 x 10  OMEGA Palloff Press with #5 with romberg stance #5 2 x 10  Monster Walks with yellow band 10 M x 2   -Pt reports increased right sided radicular pain on LLE   Side Lying Hip Abduction 2 x 10  -Pt reports increased radicular pain on LLE with NRPS 9/10  Calm Shells with blue band 1 x 10  -NRPS 2/10    12/04/23: Recumbent bike for 3 minutes with seat 10 with resistance 3  Recumbent bike for 5 minutes for intensity with resistance 3 Side stepping with red band 6x10 ft  -pt took seated break after 4th set Marches with BUE support 1x10  Marches with BUE support with #3 AW 1x10  Marches with BUE support with #5 AW 1x10  Squats to chair with Airex pad 3x10  12/02/23: Nu-step with seat at 9 with resistance 3 - 5 min  Hip ER R/L 4/4  Hip IR R/L 4/4  Side Lying Hip Abduction 1 x 10  -Pt is unable  to not externally rotate hips to maintain hip rotation  Clam Shell with green band 3 x 10  Standing Hip Extension with BUE support 2 x 10 Supine Abdominal Reach 3 x 10  Nu-Step with seat and arms at 9 with 81 SPM with resistance at 3 for 5 min  -Song test to gauge intensity    PATIENT EDUCATION:  Education details: HEP, POC, goals  Person educated: Patient Education method: Medical illustrator Education comprehension: verbalized understanding and returned demonstration  HOME EXERCISE PROGRAM: Access Code: [facility] URL: [email or web address] Date: 12/04/2023 Prepared by: Consuelo Pandy  Exercises - Seated Piriformis Stretch  - 2-3 x daily - 3-4 x weekly - 3 reps - 60 second hold - Seated Hamstring Stretch  - 1 x daily - 3-4 x weekly - 3 reps - 60 seccond hold - Supine Lower Trunk Rotation  - 2-3 x daily - 7 x weekly - 10 reps - Supine 90/90  Alternating Toe Touch  - 3-4 x weekly - 3 sets - 5 reps - Clamshell with Resistance  - 3-4 x weekly - 3 sets - 10 reps - Ab Prep  - 3-4 x weekly - 3 sets - 10 reps - Squat with Chair Touch  - 1 x daily - 3-4 x weekly - 3 sets - 10 reps - Side Stepping with Resistance at Ankles  - 1 x daily - 3-4 x weekly - 3 sets - 10 reps  ASSESSMENT:  CLINICAL IMPRESSION: Pt continues to progress towards goals with an increase resistance with strengthening exercises. Pt was limited by left sided radicular pain especially with weight bearing exercises. Modified home exercise plan with non-weight bearing hip strengthening exercises. Pt will continue to benefit from skilled PT to address these deficits to help alleviate symptoms and increase function.   OBJECTIVE IMPAIRMENTS: Abnormal gait, decreased activity tolerance, decreased endurance, decreased mobility, difficulty walking, decreased ROM, decreased strength, hypomobility, impaired sensation, postural dysfunction, and pain.   ACTIVITY LIMITATIONS: carrying, lifting, sitting, standing, and transfers  PARTICIPATION LIMITATIONS: cleaning, laundry, and community activity  PERSONAL FACTORS: Age, Behavior pattern, Past/current experiences, Time since onset of injury/illness/exacerbation, and 3+ comorbidities: hx of breast cancer, HTN, obesity, CKD stage 3a  are also affecting patient's functional outcome.   REHAB POTENTIAL: Good  CLINICAL DECISION MAKING: Stable/uncomplicated  EVALUATION COMPLEXITY: Low   GOALS: Goals reviewed with patient? Yes  SHORT TERM GOALS: Target date: 12/17/2023  Patient will be independent in HEP to improve strength/mobility for better functional independence with ADLs. Baseline:1/14: HEP initiated  Goal status: ONGOING    LONG TERM GOALS: Target date: 01/07/2024  Patient will increase FOTO score to equal to or greater than 64 to demonstrate statistically significant improvement in mobility and quality of life.  Baseline:  1/14: 60 Goal status: ONGOING   2.  Patient (> 52 years old) will complete five times sit to stand test in < 13 seconds indicating an increased LE strength and improved balance. Baseline: 1/14: 16.28 seconds Goal status: ONGOING   3.  Patient will increase six minute walk test distance by 200' for to improve activity tolerance and gait ability  Baseline: 1/14: 1,200 ft  Goal status: ONGOING   4.  Patient will increase BLE gross strength to 4+/5 as to improve functional strength for increased standing tolerance and increased ADL ability. Baseline: 1/14: see chart above  Goal status: ONGOING   5.  Patient will report a worst pain of 3/10 on NRPS in low back and LLE to improve tolerance  with ADLs and reduced symptoms with activities. Baseline: 1/14: 10/10 pain  Goal status: ONGOING    PLAN:  PT FREQUENCY: 1-2x/week  PT DURATION: 6 weeks  PLANNED INTERVENTIONS: 97164- PT Re-evaluation, 97110-Therapeutic exercises, 97530- Therapeutic activity, 97112- Neuromuscular re-education, 97535- Self Care, 91478- Manual therapy, [id number]- Gait training, 97014- Electrical stimulation (unattended), [phone]- Electrical stimulation (manual), Patient/Family education, Dry Needling, Joint mobilization, Joint manipulation, Spinal manipulation, Spinal mobilization, DME instructions, Cryotherapy, and Moist heat.  PLAN FOR NEXT SESSION:  Reassess FOTO and strength measurements. Continue with core strengthening exercises and leg exercises. Leg press   Johnn Hai, PT, DPT  Physical Therapist - [facility]  [facility]  12/10/2023, 4:56 PM

## 2023-12-12 ENCOUNTER — Ambulatory Visit: Payer: Medicare HMO | Admitting: Physical Therapy

## 2023-12-12 ENCOUNTER — Encounter: Payer: Self-pay | Admitting: Physical Therapy

## 2023-12-12 DIAGNOSIS — M5416 Radiculopathy, lumbar region: Secondary | ICD-10-CM

## 2023-12-12 DIAGNOSIS — M6281 Muscle weakness (generalized): Secondary | ICD-10-CM

## 2023-12-12 DIAGNOSIS — G8929 Other chronic pain: Secondary | ICD-10-CM | POA: Diagnosis not present

## 2023-12-12 DIAGNOSIS — M5442 Lumbago with sciatica, left side: Secondary | ICD-10-CM | POA: Diagnosis not present

## 2023-12-12 DIAGNOSIS — M5459 Other low back pain: Secondary | ICD-10-CM | POA: Diagnosis not present

## 2023-12-12 DIAGNOSIS — M4316 Spondylolisthesis, lumbar region: Secondary | ICD-10-CM | POA: Diagnosis not present

## 2023-12-12 NOTE — Therapy (Signed)
OUTPATIENT PHYSICAL THERAPY THORACOLUMBAR TREATMENT    Patient Name: Andrea Randolph MRN: [id number] DOB:February 05, 1956, 68 y.o., female Today's Date: 12/12/2023  END OF SESSION:  PT End of Session - 12/12/23 1651     Visit Number 6    Number of Visits 20    Date for PT Re-Evaluation 01/07/24    Authorization Type [employer] 2025    Authorization - Visit Number 6    Authorization - Number of Visits 13    Progress Note Due on Visit 10    PT Start Time 1647    PT Stop Time 1732    PT Time Calculation (min) 45 min    Activity Tolerance Patient tolerated treatment well    Behavior During Therapy WFL for tasks assessed/performed               Past Medical History:  Diagnosis Date   Allergy    Anemia    Arthritis of right shoulder region    Dr. Katrinka Blazing   Cancer [facility])    breast cancer right side   Chronic insomnia    Diabetes mellitus without complication (HCC)    Family history of adverse reaction to anesthesia    sister with Nausea   GERD (gastroesophageal reflux disease)    Goiter    Dr. Tedd Sias   Heart murmur    Hyperlipidemia    Hypertension    Hypertensive retinopathy of left eye    Hypothyroidism, adult    Dr. Tedd Sias   Inflammation of urethra    Menopause    Microalbuminuria    DM   Mixed incontinence    Muscle cramps    Obesity    OSA (obstructive sleep apnea)    Personal history of radiation therapy    Breast Cancer Right breast   Radiculitis, lumbosacral    Vitamin D deficiency    Past Surgical History:  Procedure Laterality Date   BREAST BIOPSY Left 90's   benign core needle    BREAST BIOPSY Right 02/07/2021   [place] Bx, Q-clip, DCIS   BREAST LUMPECTOMY Right 03/06/2021   BREAST LUMPECTOMY WITH SENTINEL LYMPH NODE BIOPSY Right 03/06/2021   Procedure: BREAST LUMPECTOMY WITH SENTINEL LYMPH NODE BX;  Surgeon: Earline Mayotte, MD;  Location: [facility] ORS;  Service: General;  Laterality: Right;   COLONOSCOPY WITH PROPOFOL N/A 01/26/2019   Procedure:  COLONOSCOPY WITH PROPOFOL;  Surgeon: Toney Reil, MD;  Location: [facility] ENDOSCOPY;  Service: Gastroenterology;  Laterality: N/A;   COLONOSCOPY WITH PROPOFOL N/A 02/09/2022   Procedure: COLONOSCOPY WITH PROPOFOL;  Surgeon: Wyline Mood, MD;  Location: [facility] ENDOSCOPY;  Service: Gastroenterology;  Laterality: N/A;   FOOT SURGERY Bilateral    heel spurs   JOINT REPLACEMENT     TOTAL SHOULDER REPLACEMENT Right 01/25/2015   Dr. Joice Lofts   TOTAL THYROIDECTOMY  08/01/2018   TRIGGER FINGER RELEASE Right 06/10/2023   ring finger - Dr. Rosita Kea   Patient Active Problem List   Diagnosis Date Noted   Diabetes mellitus with microalbuminuria (HCC) 11/15/2023   Chronic bilateral low back pain with left-sided sciatica 07/11/2023   Genetic testing 03/28/2022   Stage 3a chronic kidney disease (CKD) (HCC) 03/07/2022   Aromatase inhibitor use 05/30/2021   Malignant 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, estrogen receptor positive (HCC) 02/20/2021   Carpal tunnel syndrome, bilateral 02/19/2019   Trigger finger of right thumb 02/19/2019   Flexor tenosynovitis of finger 02/19/2019   Mild concentric left ventricular hypertrophy (LVH) 12/24/2017  Mild aortic stenosis by prior echocardiogram 12/24/2017   Mild aortic regurgitation 12/24/2017   Uterine fibroid 01/11/2016   Allergic rhinitis, seasonal 05/24/2015   Benign essential HTN 05/24/2015   Chronic constipation 05/24/2015   Dyslipidemia 05/24/2015   Post-surgical hypothyroidism 05/24/2015   Mixed incontinence 05/24/2015   Microalbuminuria 05/24/2015   Menopause 05/24/2015   Obstructive apnea 05/24/2015   Morbid obesity (HCC) 05/24/2015   Hypertensive retinopathy 05/24/2015   Vitamin D deficiency 05/24/2015   Status post total replacement of right shoulder 03/11/2015    PCP: Alba Cory, MD  REFERRING PROVIDER: Venetia Night, MD  REFERRING DIAG:  M43.16 (ICD-10-CM) - Spondylolisthesis of lumbar region M54.16  (ICD-10-CM) - Lumbar radiculopathy M54.42,G89.29 (ICD-10-CM) - Chronic left-sided low back pain with left-sided sciatica  Rationale for Evaluation and Treatment: Rehabilitation  THERAPY DIAG:  Other low back pain  Radiculopathy, lumbar region  Muscle weakness (generalized)  ONSET DATE: chronic  SUBJECTIVE:                                                                                                                                                                                           SUBJECTIVE STATEMENT: Pt reports that her legs feel okay today but having some back pain after driving around all day.  PERTINENT HISTORY:  Per Dr. Lucienne Capers note (09/24/2023), "chief complaint of lower back and left leg pain.  She has numbness into her left thigh.  She also has some right sided low back pain.  She has been having pain in her back and legs for nearly 2 years.  She has had bilateral knee pain for 2 to 3 months.  Her pain can be as bad as 6 out of 10.  Activity makes it worse.  Rest improves her pain."  PAIN:  Are you having pain? Yes: NPRS scale: 5-6/10 Pain location: Central spinous process of low back  Pain description: Achy  Aggravating factors: Walking or standing for a long time  Relieving factors: Sitting down     PRECAUTIONS: None  RED FLAGS: None   WEIGHT BEARING RESTRICTIONS: No  FALLS:  Has patient fallen in last 6 months? No  LIVING ENVIRONMENT: Lives with:  lives with sisters  Lives in: House/apartment Stairs: Yes: Internal: flight steps; on right going up and External: 6-7 steps; on right going up Has following equipment at home: None  OCCUPATION: retired   PLOF: Independent  PATIENT GOALS: learning techniques to help alleviate pain/discomfort    OBJECTIVE:  Note: Objective measures were completed at Evaluation unless otherwise noted.  DIAGNOSTIC FINDINGS:  EXAM: MRI LUMBAR SPINE WITHOUT CONTRAST  IMPRESSION: 1. New moderate-sized disc  extrusion  in the left subarticular zone at L3-4 with cephalad extension of disc material, moderate mass effect on the thecal sac and left L3 nerve root encroachment. 2. Slightly progressive disc degeneration and facet hypertrophy at L4-5 with persistent moderate to severe multifactorial spinal stenosis. Moderate to severe right foraminal narrowing appears slightly worse. 3. Stable mild multifactorial spinal stenosis at L1-2 and L2-3. 4. Stable mild left greater than right foraminal narrowing at L5-S1.  PATIENT SURVEYS:  FOTO 60 with goal of 64  12/12/23: 57  COGNITION: Overall cognitive status: Within functional limits for tasks assessed     SENSATION: Light touch: Impaired  - numbness noted to L inner thigh (L3 dermatome)  MUSCLE LENGTH: Equal hamstring tightness bilaterally Tightness noted to B piriformis (L>R)  POSTURE: rounded shoulders and forward head  PALPATION: To be further assessed at visit #2     LUMBAR ROM:   AROM eval  Flexion WFL  Extension WFL   Right lateral flexion WFL   Left lateral flexion WFL  Right rotation WFL   Left rotation WFL    (Blank rows = not tested)   LOWER EXTREMITY MMT:    MMT Right eval Left eval Right 12/12/23 Left 12/12/23  Hip flexion 4 4 4+ 4+  Hip extension 4- 4- 4 4  Hip abduction 4 4 4+ 4+  Hip adduction 4 4 4 4   Hip internal rotation 4 4 4+ 4+  Hip external rotation 4 4 4+ 4+  Knee flexion 4+ 4+    Knee extension 4+ 4+    Ankle dorsiflexion 4+ 4+    Ankle plantarflexion      Ankle inversion      Ankle eversion       (Blank rows = not tested)  LUMBAR SPECIAL TESTS:  To be assessed visit #2 Straight Leg Raise:             FADIR Negative on LLE             FABER Negative on LLE       FUNCTIONAL TESTS:  5 times sit to stand: 16.28 seconds  6 minute walk test: 1,200 ft  12/12/23: 1,471 ft   GAIT: Distance walked: 23' Assistive device utilized: None Level of assistance: Complete Independence Comments: no  obvious deviations, self reports when fatigued that she feels that she "walks funny"   TREATMENT DATE:   12/12/23: Recumbent bike with resistance at 3 for 6 min  FOTO 57 Reassess Hip MMT *see above* 6 MWT 1438ft  Figure 4 bridges 2x10  Dead bugs 2x10   December 21, 2023: Recumbent bike for 5 minutes with seat at 10 and resistance at 3 for 6 min Bird Dog 2 x 10  Bird Dog 2 x 5 - Pt unable to fully extend hips with knees straight  Bird Dog with bent knee 1 x 10  OMEGA Palloff Press with #5 4 x 10  OMEGA Palloff Press with #5 with romberg stance #5 2 x 10  Monster Walks with yellow band 10 M x 2   -Pt reports increased right sided radicular pain on LLE   Side Lying Hip Abduction 2 x 10  -Pt reports increased radicular pain on LLE with NRPS 9/10  Calm Shells with blue band 1 x 10  -NRPS 2/10    12/04/23: Recumbent bike for 3 minutes with seat 10 with resistance 3  Recumbent bike for 5 minutes for intensity with resistance 3 Side stepping with red band 6x10 ft  -pt took seated  break after 4th set Marches with BUE support 1x10  Marches with BUE support with #3 AW 1x10  Marches with BUE support with #5 AW 1x10  Squats to chair with Airex pad 3x10  12/02/23: Nu-step with seat at 9 with resistance 3 - 5 min  Hip ER R/L 4/4  Hip IR R/L 4/4  Side Lying Hip Abduction 1 x 10  -Pt is unable to not externally rotate hips to maintain hip rotation  Clam Shell with green band 3 x 10  Standing Hip Extension with BUE support 2 x 10 Supine Abdominal Reach 3 x 10  Nu-Step with seat and arms at 9 with 81 SPM with resistance at 3 for 5 min  -Song test to gauge intensity    PATIENT EDUCATION:  Education details: HEP, POC, goals  Person educated: Patient Education method: Medical illustrator Education comprehension: verbalized understanding and returned demonstration  HOME EXERCISE PROGRAM: Access Code: [facility] URL: [email or web address] Date: 12/12/2023 Prepared by:  Consuelo Pandy  Exercises - Seated Piriformis Stretch  - 2-3 x daily - 3-4 x weekly - 3 reps - 60 second hold - Seated Hamstring Stretch  - 1 x daily - 3-4 x weekly - 3 reps - 60 seccond hold - Supine Lower Trunk Rotation  - 2-3 x daily - 7 x weekly - 10 reps - Bird Dog  - 3-4 x weekly - 3 sets - 10 reps - Clamshell with Resistance  - 3-4 x weekly - 3 sets - 10 reps - Ab Prep  - 3-4 x weekly - 3 sets - 10 reps - Side Stepping with Resistance at Ankles  - 1 x daily - 3-4 x weekly - 3 sets - 10 reps - Dead Bug  - 1 x daily - 3-4 x weekly - 3 sets - 10 reps  ASSESSMENT:  CLINICAL IMPRESSION: Pt continues to progress towards goals with increased hip strength, adherence to her HEP, and aerobic endurance. Pt shows decreased pain in low back and radicular symptoms while walking longer distances. Pt shows a decrease in her perception of her lumbar function, but that may be due to error in survey based on responses not aligning with displayed function and progress towards goals. Pt will benefit from skilled PT to work towards remaining goals and return to daily activities without low back pain.  OBJECTIVE IMPAIRMENTS: Abnormal gait, decreased activity tolerance, decreased endurance, decreased mobility, difficulty walking, decreased ROM, decreased strength, hypomobility, impaired sensation, postural dysfunction, and pain.   ACTIVITY LIMITATIONS: carrying, lifting, sitting, standing, and transfers  PARTICIPATION LIMITATIONS: cleaning, laundry, and community activity  PERSONAL FACTORS: Age, Behavior pattern, Past/current experiences, Time since onset of injury/illness/exacerbation, and 3+ comorbidities: hx of breast cancer, HTN, obesity, CKD stage 3a  are also affecting patient's functional outcome.   REHAB POTENTIAL: Good  CLINICAL DECISION MAKING: Stable/uncomplicated  EVALUATION COMPLEXITY: Low   GOALS: Goals reviewed with patient? Yes  SHORT TERM GOALS: Target date: 12/17/2023  Patient  will be independent in HEP to improve strength/mobility for better functional independence with ADLs. Baseline:1/14: HEP initiated 12/12/23: able to complete independently  Goal status: ACHIEVED    LONG TERM GOALS: Target date: 01/07/2024  Patient will increase FOTO score to equal to or greater than 64 to demonstrate statistically significant improvement in mobility and quality of life.  Baseline: 1/14: 60 12/12/23: 57  Goal status: ONGOING   2.  Patient (> 27 years old) will complete five times sit to stand test in < 13 seconds indicating an  increased LE strength and improved balance. Baseline: 1/14: 16.28 seconds Goal status: ONGOING   3.  Patient will increase six minute walk test distance by 200' for to improve activity tolerance and gait ability  Baseline: 1/14: 1,200 ft 12/12/23: 1471 Goal status: ACHIEVED   4.  Patient will increase BLE gross strength to 4+/5 as to improve functional strength for increased standing tolerance and increased ADL ability. Baseline: 1/14: see chart above  12/12/23: see chart above  Goal status: ONGOING   5.  Patient will report a worst pain of 3/10 on NRPS in low back and LLE to improve tolerance with ADLs and reduced symptoms with activities. Baseline: 1/14: 10/10 pain  Goal status: ONGOING    PLAN:  PT FREQUENCY: 1-2x/week  PT DURATION: 6 weeks  PLANNED INTERVENTIONS: 97164- PT Re-evaluation, 97110-Therapeutic exercises, 97530- Therapeutic activity, 97112- Neuromuscular re-education, 97535- Self Care, 11914- Manual therapy, [id number]- Gait training, 97014- Electrical stimulation (unattended), [phone]- Electrical stimulation (manual), Patient/Family education, Dry Needling, Joint mobilization, Joint manipulation, Spinal manipulation, Spinal mobilization, DME instructions, Cryotherapy, and Moist heat.  PLAN FOR NEXT SESSION:  Reassess 5 STS. Leg press. Adduction exercises.    Consuelo Pandy, [place], DPT  Physical Therapist - [place]   [facility]  12/12/2023, 5:55 PM

## 2023-12-13 ENCOUNTER — Telehealth: Payer: Self-pay | Admitting: Family Medicine

## 2023-12-13 NOTE — Telephone Encounter (Signed)
PAP: Patient assistance application for Marcelline Deist has been approved by PAP Companies: [employer] from 11/13/2023 to 11/11/2024. Medication should be delivered to PAP Delivery: Home. For further shipping updates, please contact [employer] ([employer]) at [phone]. Patient ID is: [id number]   PLEASE BE ADVISED

## 2023-12-13 NOTE — Telephone Encounter (Signed)
Pt dropped off patient assistant application and I have emailed it to [employer] at [phone]. confirmation sheet received.

## 2023-12-13 NOTE — Telephone Encounter (Signed)
PAP: Application for [employer] has been submitted to [employer], via fax    PLEASE BE ADVISED

## 2023-12-17 ENCOUNTER — Ambulatory Visit: Payer: Medicare HMO | Attending: Neurosurgery | Admitting: Physical Therapy

## 2023-12-17 DIAGNOSIS — M6281 Muscle weakness (generalized): Secondary | ICD-10-CM | POA: Diagnosis not present

## 2023-12-17 DIAGNOSIS — M5416 Radiculopathy, lumbar region: Secondary | ICD-10-CM | POA: Insufficient documentation

## 2023-12-17 DIAGNOSIS — M5459 Other low back pain: Secondary | ICD-10-CM | POA: Insufficient documentation

## 2023-12-17 NOTE — Telephone Encounter (Signed)
Andrea Randolph from [employer], called in about pa application for assistance with [employer], I gave her the info that it was received by a Almira Coaster sure if you have her phone # to give to [employer], for further info

## 2023-12-17 NOTE — Therapy (Signed)
 OUTPATIENT PHYSICAL THERAPY THORACOLUMBAR TREATMENT    Patient Name: Andrea Randolph MRN: [id number] DOB:February 20, 1956, 68 y.o., female Today's Date: 12/17/2023  END OF SESSION:  PT End of Session - 12/17/23 1601     Visit Number 7    Number of Visits 20    Date for PT Re-Evaluation 01/07/24    Authorization Type [employer] 2025    Authorization - Visit Number 7    Authorization - Number of Visits 20    Progress Note Due on Visit 10    PT Start Time 1602    PT Stop Time 1644    PT Time Calculation (min) 42 min    Activity Tolerance Patient tolerated treatment well    Behavior During Therapy WFL for tasks assessed/performed               Past Medical History:  Diagnosis Date   Allergy    Anemia    Arthritis of right shoulder region    Dr. Claudene   Cancer [facility])    breast cancer right side   Chronic insomnia    Diabetes mellitus without complication (HCC)    Family history of adverse reaction to anesthesia    sister with Nausea   GERD (gastroesophageal reflux disease)    Goiter    Dr. Damian   Heart murmur    Hyperlipidemia    Hypertension    Hypertensive retinopathy of left eye    Hypothyroidism, adult    Dr. Damian   Inflammation of urethra    Menopause    Microalbuminuria    DM   Mixed incontinence    Muscle cramps    Obesity    OSA (obstructive sleep apnea)    Personal history of radiation therapy    Breast Cancer Right breast   Radiculitis, lumbosacral    Vitamin D  deficiency    Past Surgical History:  Procedure Laterality Date   BREAST BIOPSY Left 90's   benign core needle    BREAST BIOPSY Right 02/07/2021   US  Bx, Q-clip, DCIS   BREAST LUMPECTOMY Right 03/06/2021   BREAST LUMPECTOMY WITH SENTINEL LYMPH NODE BIOPSY Right 03/06/2021   Procedure: BREAST LUMPECTOMY WITH SENTINEL LYMPH NODE BX;  Surgeon: Dessa Reyes ORN, MD;  Location: [facility] ORS;  Service: General;  Laterality: Right;   COLONOSCOPY WITH PROPOFOL  N/A 01/26/2019   Procedure:  COLONOSCOPY WITH PROPOFOL ;  Surgeon: Unk Corinn Skiff, MD;  Location: [facility] ENDOSCOPY;  Service: Gastroenterology;  Laterality: N/A;   COLONOSCOPY WITH PROPOFOL  N/A 02/09/2022   Procedure: COLONOSCOPY WITH PROPOFOL ;  Surgeon: Therisa Bi, MD;  Location: [facility] ENDOSCOPY;  Service: Gastroenterology;  Laterality: N/A;   FOOT SURGERY Bilateral    heel spurs   JOINT REPLACEMENT     TOTAL SHOULDER REPLACEMENT Right 01/25/2015   Dr. Edie   TOTAL THYROIDECTOMY  08/01/2018   TRIGGER FINGER RELEASE Right 06/10/2023   ring finger - Dr. Kathlynn   Patient Active Problem List   Diagnosis Date Noted   Diabetes mellitus with microalbuminuria (HCC) 11/15/2023   Chronic bilateral low back pain with left-sided sciatica 07/11/2023   Genetic testing 03/28/2022   Stage 3a chronic kidney disease (CKD) (HCC) 03/07/2022   Aromatase inhibitor use 05/30/2021   Malignant 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, estrogen receptor positive (HCC) 02/20/2021   Carpal tunnel syndrome, bilateral 02/19/2019   Trigger finger of right thumb 02/19/2019   Flexor tenosynovitis of finger 02/19/2019   Mild concentric left ventricular hypertrophy (LVH) 12/24/2017  Mild aortic stenosis by prior echocardiogram 12/24/2017   Mild aortic regurgitation 12/24/2017   Uterine fibroid 01/11/2016   Allergic rhinitis, seasonal 05/24/2015   Benign essential HTN 05/24/2015   Chronic constipation 05/24/2015   Dyslipidemia 05/24/2015   Post-surgical hypothyroidism 05/24/2015   Mixed incontinence 05/24/2015   Microalbuminuria 05/24/2015   Menopause 05/24/2015   Obstructive apnea 05/24/2015   Morbid obesity (HCC) 05/24/2015   Hypertensive retinopathy 05/24/2015   Vitamin D  deficiency 05/24/2015   Status post total replacement of right shoulder 03/11/2015    PCP: Glenard Mire, MD  REFERRING PROVIDER: Clois Fret, MD  REFERRING DIAG:  M43.16 (ICD-10-CM) - Spondylolisthesis of lumbar region M54.16  (ICD-10-CM) - Lumbar radiculopathy M54.42,G89.29 (ICD-10-CM) - Chronic left-sided low back pain with left-sided sciatica  Rationale for Evaluation and Treatment: Rehabilitation  THERAPY DIAG:  Other low back pain  Radiculopathy, lumbar region  Muscle weakness (generalized)  ONSET DATE: chronic  SUBJECTIVE:                                                                                                                                                                                           SUBJECTIVE STATEMENT: Pt reports that her back and legs have been feeling ok. She had a relatively easy weekend so her pain didn't go above a 3-4/10.  PERTINENT HISTORY:  Per Dr. Elliot note (09/24/2023), chief complaint of lower back and left leg pain.  She has numbness into her left thigh.  She also has some right sided low back pain.  She has been having pain in her back and legs for nearly 2 years.  She has had bilateral knee pain for 2 to 3 months.  Her pain can be as bad as 6 out of 10.  Activity makes it worse.  Rest improves her pain.  PAIN:  Are you having pain? Yes: NPRS scale: 5-6/10 Pain location: Central spinous process of low back  Pain description: Achy  Aggravating factors: Walking or standing for a long time  Relieving factors: Sitting down     PRECAUTIONS: None  RED FLAGS: None   WEIGHT BEARING RESTRICTIONS: No  FALLS:  Has patient fallen in last 6 months? No  LIVING ENVIRONMENT: Lives with:  lives with sisters  Lives in: House/apartment Stairs: Yes: Internal: flight steps; on right going up and External: 6-7 steps; on right going up Has following equipment at home: None  OCCUPATION: retired   PLOF: Independent  PATIENT GOALS: learning techniques to help alleviate pain/discomfort    OBJECTIVE:  Note: Objective measures were completed at Evaluation unless otherwise noted.  DIAGNOSTIC FINDINGS:  EXAM: MRI LUMBAR SPINE WITHOUT CONTRAST  IMPRESSION: 1.  New moderate-sized disc extrusion in the left subarticular zone at L3-4 with cephalad extension of disc material, moderate mass effect on the thecal sac and left L3 nerve root encroachment. 2. Slightly progressive disc degeneration and facet hypertrophy at L4-5 with persistent moderate to severe multifactorial spinal stenosis. Moderate to severe right foraminal narrowing appears slightly worse. 3. Stable mild multifactorial spinal stenosis at L1-2 and L2-3. 4. Stable mild left greater than right foraminal narrowing at L5-S1.  PATIENT SURVEYS:  FOTO 60 with goal of 64  12/12/23: 57  COGNITION: Overall cognitive status: Within functional limits for tasks assessed     SENSATION: Light touch: Impaired  - numbness noted to L inner thigh (L3 dermatome)  MUSCLE LENGTH: Equal hamstring tightness bilaterally Tightness noted to B piriformis (L>R)  POSTURE: rounded shoulders and forward head  PALPATION: To be further assessed at visit #2     LUMBAR ROM:   AROM eval  Flexion WFL  Extension WFL   Right lateral flexion WFL   Left lateral flexion WFL  Right rotation WFL   Left rotation WFL    (Blank rows = not tested)   LOWER EXTREMITY MMT:    MMT Right eval Left eval Right 12/12/23 Left 12/12/23  Hip flexion 4 4 4+ 4+  Hip extension 4- 4- 4 4  Hip abduction 4 4 4+ 4+  Hip adduction 4 4 4 4   Hip internal rotation 4 4 4+ 4+  Hip external rotation 4 4 4+ 4+  Knee flexion 4+ 4+    Knee extension 4+ 4+    Ankle dorsiflexion 4+ 4+    Ankle plantarflexion      Ankle inversion      Ankle eversion       (Blank rows = not tested)  LUMBAR SPECIAL TESTS:  To be assessed visit #2 Straight Leg Raise:             FADIR Negative on LLE             FABER Negative on LLE       FUNCTIONAL TESTS:  5 times sit to stand: 16.28 seconds  6 minute walk test: 1,200 ft  12/12/23: 1,471 ft   GAIT: Distance walked: 69' Assistive device utilized: None Level of assistance: Complete  Independence Comments: no obvious deviations, self reports when fatigued that she feels that she walks funny   TREATMENT DATE:   12/17/23: THEREX Recumbent bike with resistance at 3 for 6 min OMEGA Pallof press #5 1x10 OMEGA Pallof press #10 2x10   -when coming from the right, a little leaning taking stress off abs  OMEGA Leg Press #65 1x10 OMEGA Leg Press #75 4x10  Monster walks with yellow band 2x37m Bridges with ball between knees 2x10  Bridges with ball between knees with 3 sec hold at the top 2x10   PHYSICAL PERFORMANCE 5 STS 13.26 sec  12/12/23: Recumbent bike with resistance at 3 for 6 min  FOTO 57 Reassess Hip MMT *see above* 6 MWT 1424ft  Figure 4 bridges 2x10  Dead bugs 2x10   December 12, 2023: Recumbent bike for 5 minutes with seat at 10 and resistance at 3 for 6 min Bird Dog 2 x 10  Bird Dog 2 x 5 - Pt unable to fully extend hips with knees straight  Bird Dog with bent knee 1 x 10  OMEGA Palloff Press with #5 4 x 10  OMEGA Palloff Press with #5 with romberg stance #5 2 x 10  Monster Walks with yellow band 10 M x 2   -Pt reports increased right sided radicular pain on LLE   Side Lying Hip Abduction 2 x 10  -Pt reports increased radicular pain on LLE with NRPS 9/10  Calm Shells with blue band 1 x 10  -NRPS 2/10    12/04/23: Recumbent bike for 3 minutes with seat 10 with resistance 3  Recumbent bike for 5 minutes for intensity with resistance 3 Side stepping with red band 6x10 ft  -pt took seated break after 4th set Marches with BUE support 1x10  Marches with BUE support with #3 AW 1x10  Marches with BUE support with #5 AW 1x10  Squats to chair with Airex pad 3x10  12/02/23: Nu-step with seat at 9 with resistance 3 - 5 min  Hip ER R/L 4/4  Hip IR R/L 4/4  Side Lying Hip Abduction 1 x 10  -Pt is unable to not externally rotate hips to maintain hip rotation  Clam Shell with green band 3 x 10  Standing Hip Extension with BUE support 2 x 10 Supine Abdominal  Reach 3 x 10  Nu-Step with seat and arms at 9 with 81 SPM with resistance at 3 for 5 min  -Song test to gauge intensity    PATIENT EDUCATION:  Education details: HEP, POC, goals  Person educated: Patient Education method: Medical Illustrator Education comprehension: verbalized understanding and returned demonstration  HOME EXERCISE PROGRAM: Access Code: [facility] URL: [email or web address] Date: 12/17/2023 Prepared by: Arleene Euler  Exercises - Seated Piriformis Stretch  - 2-3 x daily - 3-4 x weekly - 3 reps - 60 second hold - Seated Hamstring Stretch  - 1 x daily - 3-4 x weekly - 3 reps - 60 seccond hold - Supine Lower Trunk Rotation  - 2-3 x daily - 7 x weekly - 10 reps - Bird Dog  - 3-4 x weekly - 3 sets - 10 reps - Clamshell with Resistance  - 3-4 x weekly - 3 sets - 10 reps - Ab Prep  - 3-4 x weekly - 3 sets - 10 reps - Dead Bug  - 1 x daily - 3-4 x weekly - 3 sets - 10 reps - Supine Bridge with Mini Swiss Ball Between Knees  - 1 x daily - 3-4 x weekly - 3 sets - 10 reps  ASSESSMENT:  CLINICAL IMPRESSION: Pt progressing towards goals with an improvement in abdominal and LE strength as evidenced by increased resistance with Palloff press and decreased 5 x STS time. PT will continue to gauge consistency of pain reduction and LE strength to determine discharge status. Pt will continue to benefit from further hip and core strengthening exercises to decrease low back pain during everyday activities.   OBJECTIVE IMPAIRMENTS: Abnormal gait, decreased activity tolerance, decreased endurance, decreased mobility, difficulty walking, decreased ROM, decreased strength, hypomobility, impaired sensation, postural dysfunction, and pain.   ACTIVITY LIMITATIONS: carrying, lifting, sitting, standing, and transfers  PARTICIPATION LIMITATIONS: cleaning, laundry, and community activity  PERSONAL FACTORS: Age, Behavior pattern, Past/current experiences, Time since  onset of injury/illness/exacerbation, and 3+ comorbidities: hx of breast cancer, HTN, obesity, CKD stage 3a  are also affecting patient's functional outcome.   REHAB POTENTIAL: Good  CLINICAL DECISION MAKING: Stable/uncomplicated  EVALUATION COMPLEXITY: Low   GOALS: Goals reviewed with patient? Yes  SHORT TERM GOALS: Target date: 12/17/2023  Patient will be independent in HEP to improve strength/mobility for better functional independence with ADLs. Baseline:1/14: HEP initiated 12/12/23: able to complete  independently  Goal status: ACHIEVED    LONG TERM GOALS: Target date: 01/07/2024  Patient will increase FOTO score to equal to or greater than 64 to demonstrate statistically significant improvement in mobility and quality of life.  Baseline: 1/14: 60 12/12/23: 57  Goal status: ONGOING   2.  Patient (> 10 years old) will complete five times sit to stand test in < 13 seconds indicating an increased LE strength and improved balance. Baseline: 1/14: 16.28 seconds 12/17/23: 13.26 seconds Goal status: ACHIEVED   3.  Patient will increase six minute walk test distance by 200' for to improve activity tolerance and gait ability  Baseline: 1/14: 1,200 ft 12/12/23: 1471 Goal status: ACHIEVED   4.  Patient will increase BLE gross strength to 4+/5 as to improve functional strength for increased standing tolerance and increased ADL ability. Baseline: 1/14: see chart above  12/12/23: see chart above  Goal status: ONGOING   5.  Patient will report a worst pain of 3/10 on NRPS in low back and LLE to improve tolerance with ADLs and reduced symptoms with activities. Baseline: 1/14: 10/10 pain  Goal status: ONGOING    PLAN:  PT FREQUENCY: 1-2x/week  PT DURATION: 6 weeks  PLANNED INTERVENTIONS: 97164- PT Re-evaluation, 97110-Therapeutic exercises, 97530- Therapeutic activity, 97112- Neuromuscular re-education, 97535- Self Care, [id number]- Manual therapy, [id number]- Gait training, 97014- Electrical  stimulation (unattended), [phone]- Electrical stimulation (manual), Patient/Family education, Dry Needling, Joint mobilization, Joint manipulation, Spinal manipulation, Spinal mobilization, DME instructions, Cryotherapy, and Moist heat.  PLAN FOR NEXT SESSION:  Advance leg press weight. Progress HEP if easy. Bridges with adduction and leg extension.    Arleene Euler, [place], DPT  Physical Therapist - [facility]  12/17/2023, 5:29 PM

## 2023-12-19 ENCOUNTER — Encounter: Payer: Self-pay | Admitting: Physical Therapy

## 2023-12-19 ENCOUNTER — Ambulatory Visit: Payer: Medicare HMO | Admitting: Physical Therapy

## 2023-12-19 DIAGNOSIS — M5459 Other low back pain: Secondary | ICD-10-CM | POA: Diagnosis not present

## 2023-12-19 DIAGNOSIS — M5416 Radiculopathy, lumbar region: Secondary | ICD-10-CM | POA: Diagnosis not present

## 2023-12-19 DIAGNOSIS — M6281 Muscle weakness (generalized): Secondary | ICD-10-CM | POA: Diagnosis not present

## 2023-12-19 NOTE — Therapy (Signed)
 OUTPATIENT PHYSICAL THERAPY THORACOLUMBAR TREATMENT    Patient Name: Andrea Randolph MRN: [id number] DOB:1956/06/11, 68 y.o., female Today's Date: 12/19/2023  END OF SESSION:  PT End of Session - 12/19/23 1607     Visit Number 8    Number of Visits 20    Date for PT Re-Evaluation 01/07/24    Authorization Type [employer] 2025    Authorization - Visit Number 8    Authorization - Number of Visits 20    Progress Note Due on Visit 10    PT Start Time 1605    PT Stop Time 1645    PT Time Calculation (min) 40 min    Activity Tolerance Patient tolerated treatment well    Behavior During Therapy WFL for tasks assessed/performed               Past Medical History:  Diagnosis Date   Allergy    Anemia    Arthritis of right shoulder region    Dr. Claudene   Cancer [facility])    breast cancer right side   Chronic insomnia    Diabetes mellitus without complication (HCC)    Family history of adverse reaction to anesthesia    sister with Nausea   GERD (gastroesophageal reflux disease)    Goiter    Dr. Damian   Heart murmur    Hyperlipidemia    Hypertension    Hypertensive retinopathy of left eye    Hypothyroidism, adult    Dr. Damian   Inflammation of urethra    Menopause    Microalbuminuria    DM   Mixed incontinence    Muscle cramps    Obesity    OSA (obstructive sleep apnea)    Personal history of radiation therapy    Breast Cancer Right breast   Radiculitis, lumbosacral    Vitamin D  deficiency    Past Surgical History:  Procedure Laterality Date   BREAST BIOPSY Left 90's   benign core needle    BREAST BIOPSY Right 02/07/2021   US  Bx, Q-clip, DCIS   BREAST LUMPECTOMY Right 03/06/2021   BREAST LUMPECTOMY WITH SENTINEL LYMPH NODE BIOPSY Right 03/06/2021   Procedure: BREAST LUMPECTOMY WITH SENTINEL LYMPH NODE BX;  Surgeon: Dessa Reyes ORN, MD;  Location: [facility] ORS;  Service: General;  Laterality: Right;   COLONOSCOPY WITH PROPOFOL  N/A 01/26/2019   Procedure:  COLONOSCOPY WITH PROPOFOL ;  Surgeon: Unk Corinn Skiff, MD;  Location: [facility] ENDOSCOPY;  Service: Gastroenterology;  Laterality: N/A;   COLONOSCOPY WITH PROPOFOL  N/A 02/09/2022   Procedure: COLONOSCOPY WITH PROPOFOL ;  Surgeon: Therisa Bi, MD;  Location: [facility] ENDOSCOPY;  Service: Gastroenterology;  Laterality: N/A;   FOOT SURGERY Bilateral    heel spurs   JOINT REPLACEMENT     TOTAL SHOULDER REPLACEMENT Right 01/25/2015   Dr. Edie   TOTAL THYROIDECTOMY  08/01/2018   TRIGGER FINGER RELEASE Right 06/10/2023   ring finger - Dr. Kathlynn   Patient Active Problem List   Diagnosis Date Noted   Diabetes mellitus with microalbuminuria (HCC) 11/15/2023   Chronic bilateral low back pain with left-sided sciatica 07/11/2023   Genetic testing 03/28/2022   Stage 3a chronic kidney disease (CKD) (HCC) 03/07/2022   Aromatase inhibitor use 05/30/2021   Malignant 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, estrogen receptor positive (HCC) 02/20/2021   Carpal tunnel syndrome, bilateral 02/19/2019   Trigger finger of right thumb 02/19/2019   Flexor tenosynovitis of finger 02/19/2019   Mild concentric left ventricular hypertrophy (LVH) 12/24/2017  Mild aortic stenosis by prior echocardiogram 12/24/2017   Mild aortic regurgitation 12/24/2017   Uterine fibroid 01/11/2016   Allergic rhinitis, seasonal 05/24/2015   Benign essential HTN 05/24/2015   Chronic constipation 05/24/2015   Dyslipidemia 05/24/2015   Post-surgical hypothyroidism 05/24/2015   Mixed incontinence 05/24/2015   Microalbuminuria 05/24/2015   Menopause 05/24/2015   Obstructive apnea 05/24/2015   Morbid obesity (HCC) 05/24/2015   Hypertensive retinopathy 05/24/2015   Vitamin D  deficiency 05/24/2015   Status post total replacement of right shoulder 03/11/2015    PCP: Glenard Mire, MD  REFERRING PROVIDER: Clois Fret, MD  REFERRING DIAG:  M43.16 (ICD-10-CM) - Spondylolisthesis of lumbar region M54.16  (ICD-10-CM) - Lumbar radiculopathy M54.42,G89.29 (ICD-10-CM) - Chronic left-sided low back pain with left-sided sciatica  Rationale for Evaluation and Treatment: Rehabilitation  THERAPY DIAG:  Other low back pain  Radiculopathy, lumbar region  Muscle weakness (generalized)  ONSET DATE: chronic  SUBJECTIVE:                                                                                                                                                                                           SUBJECTIVE STATEMENT: Pt reports feeling increased muscle soreness since last visit. She has not experienced an increase in her nerve pain.   PERTINENT HISTORY:  Per Dr. Elliot note (09/24/2023), chief complaint of lower back and left leg pain.  She has numbness into her left thigh.  She also has some right sided low back pain.  She has been having pain in her back and legs for nearly 2 years.  She has had bilateral knee pain for 2 to 3 months.  Her pain can be as bad as 6 out of 10.  Activity makes it worse.  Rest improves her pain.  PAIN:  Are you having pain? Yes: NPRS scale: 2-3/10 Pain location: Central spinous process of low back  Pain description: Achy  Aggravating factors: Walking or standing for a long time  Relieving factors: Sitting down     PRECAUTIONS: None  RED FLAGS: None   WEIGHT BEARING RESTRICTIONS: No  FALLS:  Has patient fallen in last 6 months? No  LIVING ENVIRONMENT: Lives with:  lives with sisters  Lives in: House/apartment Stairs: Yes: Internal: flight steps; on right going up and External: 6-7 steps; on right going up Has following equipment at home: None  OCCUPATION: retired   PLOF: Independent  PATIENT GOALS: learning techniques to help alleviate pain/discomfort    OBJECTIVE:  Note: Objective measures were completed at Evaluation unless otherwise noted.  DIAGNOSTIC FINDINGS:  EXAM: MRI LUMBAR SPINE WITHOUT CONTRAST  IMPRESSION: 1. New  moderate-sized  disc extrusion in the left subarticular zone at L3-4 with cephalad extension of disc material, moderate mass effect on the thecal sac and left L3 nerve root encroachment. 2. Slightly progressive disc degeneration and facet hypertrophy at L4-5 with persistent moderate to severe multifactorial spinal stenosis. Moderate to severe right foraminal narrowing appears slightly worse. 3. Stable mild multifactorial spinal stenosis at L1-2 and L2-3. 4. Stable mild left greater than right foraminal narrowing at L5-S1.  PATIENT SURVEYS:  FOTO 60 with goal of 64  12/12/23: 57  COGNITION: Overall cognitive status: Within functional limits for tasks assessed     SENSATION: Light touch: Impaired  - numbness noted to L inner thigh (L3 dermatome)  MUSCLE LENGTH: Equal hamstring tightness bilaterally Tightness noted to B piriformis (L>R)  POSTURE: rounded shoulders and forward head  PALPATION: To be further assessed at visit #2     LUMBAR ROM:   AROM eval  Flexion WFL  Extension WFL   Right lateral flexion WFL   Left lateral flexion WFL  Right rotation WFL   Left rotation WFL    (Blank rows = not tested)   LOWER EXTREMITY MMT:    MMT Right eval Left eval Right 12/12/23 Left 12/12/23  Hip flexion 4 4 4+ 4+  Hip extension 4- 4- 4 4  Hip abduction 4 4 4+ 4+  Hip adduction 4 4 4 4   Hip internal rotation 4 4 4+ 4+  Hip external rotation 4 4 4+ 4+  Knee flexion 4+ 4+    Knee extension 4+ 4+    Ankle dorsiflexion 4+ 4+    Ankle plantarflexion      Ankle inversion      Ankle eversion       (Blank rows = not tested)  LUMBAR SPECIAL TESTS:  To be assessed visit #2 Straight Leg Raise:             FADIR Negative on LLE             FABER Negative on LLE       FUNCTIONAL TESTS:  5 times sit to stand: 16.28 seconds  6 minute walk test: 1,200 ft  12/12/23: 1,471 ft   GAIT: Distance walked: 1' Assistive device utilized: None Level of assistance: Complete  Independence Comments: no obvious deviations, self reports when fatigued that she feels that she walks funny   TREATMENT DATE:   12/19/23: THEREX  Recumbent bike with resistance at 3 for 6 min OMEGA Leg Press #80 3 x 10  Bridges with ball squeeze and with knee extension 3 x 10  Standing Hip Abduction 3 x 10  Standing Mini-Squat with #5 DB at 90 deg flexion 2 x 10  Weighted Crunches with #5 DB 1 x 10  Weighted Crunches with #5 x 2 DB 1 x 10    12/17/23: THEREX Recumbent bike with resistance at 3 for 6 min OMEGA Pallof press #5 1x10 OMEGA Pallof press #10 2x10   -when coming from the right, a little leaning taking stress off abs  OMEGA Leg Press #65 1x10 OMEGA Leg Press #75 4x10  Monster walks with yellow band 2x74m Bridges with ball between knees 2x10  Bridges with ball between knees with 3 sec hold at the top 2x10   PHYSICAL PERFORMANCE 5 STS 13.26 sec  12/12/23: Recumbent bike with resistance at 3 for 6 min  FOTO 57 Reassess Hip MMT *see above* 6 MWT 1481ft  Figure 4 bridges 2x10  Dead bugs 2x10   21-Dec-2023:  Recumbent bike for 5 minutes with seat at 10 and resistance at 3 for 6 min Bird Dog 2 x 10  Bird Dog 2 x 5 - Pt unable to fully extend hips with knees straight  Bird Dog with bent knee 1 x 10  OMEGA Palloff Press with #5 4 x 10  OMEGA Palloff Press with #5 with romberg stance #5 2 x 10  Monster Walks with yellow band 10 M x 2   -Pt reports increased right sided radicular pain on LLE   Side Lying Hip Abduction 2 x 10  -Pt reports increased radicular pain on LLE with NRPS 9/10  Calm Shells with blue band 1 x 10  -NRPS 2/10    12/04/23: Recumbent bike for 3 minutes with seat 10 with resistance 3  Recumbent bike for 5 minutes for intensity with resistance 3 Side stepping with red band 6x10 ft  -pt took seated break after 4th set Marches with BUE support 1x10  Marches with BUE support with #3 AW 1x10  Marches with BUE support with #5 AW 1x10  Squats to chair  with Airex pad 3x10  12/02/23: Nu-step with seat at 9 with resistance 3 - 5 min  Hip ER R/L 4/4  Hip IR R/L 4/4  Side Lying Hip Abduction 1 x 10  -Pt is unable to not externally rotate hips to maintain hip rotation  Clam Shell with green band 3 x 10  Standing Hip Extension with BUE support 2 x 10 Supine Abdominal Reach 3 x 10  Nu-Step with seat and arms at 9 with 81 SPM with resistance at 3 for 5 min  -Song test to gauge intensity    PATIENT EDUCATION:  Education details: HEP, POC, goals  Person educated: Patient Education method: Medical Illustrator Education comprehension: verbalized understanding and returned demonstration  HOME EXERCISE PROGRAM: Access Code: [facility] URL: [email or web address] Date: 12/19/2023 Prepared by: Toribio Servant  Exercises - Seated Piriformis Stretch  - 2-3 x daily - 3-4 x weekly - 3 reps - 60 second hold - Seated Hamstring Stretch  - 1 x daily - 3-4 x weekly - 3 reps - 60 seccond hold - Supine Lower Trunk Rotation  - 2-3 x daily - 7 x weekly - 10 reps - Standing Hip Abduction with Counter Support  - 3-4 x weekly - 3 sets - 10 reps - Ab Prep  - 3-4 x weekly - 3 sets - 10 reps - Dead Bug  - 1 x daily - 3-4 x weekly - 3 sets - 10 reps - Supine Bridge with Leg Extension  - 3-4 x weekly - 3 sets - 10 reps  ASSESSMENT:  CLINICAL IMPRESSION: Pt continues to demonstrate an improvement in hip and abdominal strength and decreased radicular pain with activity even standing activity. She even showed ability to perform single leg standing exercise on LLE without an increase in her radicular pain. She will continue to benefit from further hip and core strengthening exercises to decrease low back pain during everyday activities.   OBJECTIVE IMPAIRMENTS: Abnormal gait, decreased activity tolerance, decreased endurance, decreased mobility, difficulty walking, decreased ROM, decreased strength, hypomobility, impaired sensation, postural  dysfunction, and pain.   ACTIVITY LIMITATIONS: carrying, lifting, sitting, standing, and transfers  PARTICIPATION LIMITATIONS: cleaning, laundry, and community activity  PERSONAL FACTORS: Age, Behavior pattern, Past/current experiences, Time since onset of injury/illness/exacerbation, and 3+ comorbidities: hx of breast cancer, HTN, obesity, CKD stage 3a  are also affecting patient's functional outcome.   REHAB POTENTIAL:  Good  CLINICAL DECISION MAKING: Stable/uncomplicated  EVALUATION COMPLEXITY: Low   GOALS: Goals reviewed with patient? Yes  SHORT TERM GOALS: Target date: 12/17/2023  Patient will be independent in HEP to improve strength/mobility for better functional independence with ADLs. Baseline:1/14: HEP initiated 12/12/23: able to complete independently  Goal status: ACHIEVED    LONG TERM GOALS: Target date: 01/07/2024  Patient will increase FOTO score to equal to or greater than 64 to demonstrate statistically significant improvement in mobility and quality of life.  Baseline: 1/14: 60 12/12/23: 57  Goal status: ONGOING   2.  Patient (> 11 years old) will complete five times sit to stand test in < 13 seconds indicating an increased LE strength and improved balance. Baseline: 1/14: 16.28 seconds 12/17/23: 13.26 seconds Goal status: ACHIEVED   3.  Patient will increase six minute walk test distance by 200' for to improve activity tolerance and gait ability  Baseline: 1/14: 1,200 ft 12/12/23: 1471 Goal status: ACHIEVED   4.  Patient will increase BLE gross strength to 4+/5 as to improve functional strength for increased standing tolerance and increased ADL ability. Baseline: 1/14: see chart above  12/12/23: see chart above  Goal status: ONGOING   5.  Patient will report a worst pain of 3/10 on NRPS in low back and LLE to improve tolerance with ADLs and reduced symptoms with activities. Baseline: 1/14: 10/10 pain  Goal status: ONGOING    PLAN:  PT FREQUENCY:  1-2x/week  PT DURATION: 6 weeks  PLANNED INTERVENTIONS: 97164- PT Re-evaluation, 97110-Therapeutic exercises, 97530- Therapeutic activity, 97112- Neuromuscular re-education, 97535- Self Care, [id number]- Manual therapy, [id number]- Gait training, 97014- Electrical stimulation (unattended), [phone]- Electrical stimulation (manual), Patient/Family education, Dry Needling, Joint mobilization, Joint manipulation, Spinal manipulation, Spinal mobilization, DME instructions, Cryotherapy, and Moist heat.  PLAN FOR NEXT SESSION:  Advance leg press weight. Progress pallof press and leg press and standing hip abduction. Progress home exercise plan especially dead bugs.    Toribio JINNY Servant, PT, DPT  Physical Therapist - [facility]  12/19/2023, 4:07 PM

## 2023-12-24 ENCOUNTER — Ambulatory Visit: Payer: Medicare HMO | Admitting: Physical Therapy

## 2023-12-25 ENCOUNTER — Ambulatory Visit: Payer: Medicare HMO | Admitting: Physical Therapy

## 2023-12-25 ENCOUNTER — Other Ambulatory Visit: Payer: Self-pay | Admitting: Pharmacist

## 2023-12-25 ENCOUNTER — Encounter: Payer: Medicare HMO | Admitting: Physical Therapy

## 2023-12-25 ENCOUNTER — Encounter: Payer: Self-pay | Admitting: Physical Therapy

## 2023-12-25 DIAGNOSIS — M5416 Radiculopathy, lumbar region: Secondary | ICD-10-CM | POA: Diagnosis not present

## 2023-12-25 DIAGNOSIS — M6281 Muscle weakness (generalized): Secondary | ICD-10-CM | POA: Diagnosis not present

## 2023-12-25 DIAGNOSIS — M5459 Other low back pain: Secondary | ICD-10-CM

## 2023-12-25 DIAGNOSIS — I10 Essential (primary) hypertension: Secondary | ICD-10-CM

## 2023-12-25 DIAGNOSIS — N1832 Chronic kidney disease, stage 3b: Secondary | ICD-10-CM

## 2023-12-25 NOTE — Therapy (Signed)
OUTPATIENT PHYSICAL THERAPY THORACOLUMBAR TREATMENT    Patient Name: Andrea Randolph MRN: [id number] DOB:1956-08-06, 68 y.o., female Today's Date: 12/25/2023  END OF SESSION:  PT End of Session - 12/25/23 1600     Visit Number 9    Number of Visits 20    Date for PT Re-Evaluation 01/07/24    Authorization Type [employer] 2025    Authorization - Visit Number 9    Authorization - Number of Visits 20    Progress Note Due on Visit 10    PT Start Time 1600    PT Stop Time 1645    PT Time Calculation (min) 45 min               Past Medical History:  Diagnosis Date   Allergy    Anemia    Arthritis of right shoulder region    Dr. Katrinka Blazing   Cancer [facility])    breast cancer right side   Chronic insomnia    Diabetes mellitus without complication (HCC)    Family history of adverse reaction to anesthesia    sister with Nausea   GERD (gastroesophageal reflux disease)    Goiter    Dr. Tedd Sias   Heart murmur    Hyperlipidemia    Hypertension    Hypertensive retinopathy of left eye    Hypothyroidism, adult    Dr. Tedd Sias   Inflammation of urethra    Menopause    Microalbuminuria    DM   Mixed incontinence    Muscle cramps    Obesity    OSA (obstructive sleep apnea)    Personal history of radiation therapy    Breast Cancer Right breast   Radiculitis, lumbosacral    Vitamin D deficiency    Past Surgical History:  Procedure Laterality Date   BREAST BIOPSY Left 90's   benign core needle    BREAST BIOPSY Right 02/07/2021   [place] Bx, Q-clip, DCIS   BREAST LUMPECTOMY Right 03/06/2021   BREAST LUMPECTOMY WITH SENTINEL LYMPH NODE BIOPSY Right 03/06/2021   Procedure: BREAST LUMPECTOMY WITH SENTINEL LYMPH NODE BX;  Surgeon: Earline Mayotte, MD;  Location: [facility] ORS;  Service: General;  Laterality: Right;   COLONOSCOPY WITH PROPOFOL N/A 01/26/2019   Procedure: COLONOSCOPY WITH PROPOFOL;  Surgeon: Toney Reil, MD;  Location: [facility] ENDOSCOPY;  Service: Gastroenterology;   Laterality: N/A;   COLONOSCOPY WITH PROPOFOL N/A 02/09/2022   Procedure: COLONOSCOPY WITH PROPOFOL;  Surgeon: Wyline Mood, MD;  Location: [facility] ENDOSCOPY;  Service: Gastroenterology;  Laterality: N/A;   FOOT SURGERY Bilateral    heel spurs   JOINT REPLACEMENT     TOTAL SHOULDER REPLACEMENT Right 01/25/2015   Dr. Joice Lofts   TOTAL THYROIDECTOMY  08/01/2018   TRIGGER FINGER RELEASE Right 06/10/2023   ring finger - Dr. Rosita Kea   Patient Active Problem List   Diagnosis Date Noted   Diabetes mellitus with microalbuminuria (HCC) 11/15/2023   Chronic bilateral low back pain with left-sided sciatica 07/11/2023   Genetic testing 03/28/2022   Stage 3a chronic kidney disease (CKD) (HCC) 03/07/2022   Aromatase inhibitor use 05/30/2021   Malignant 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, estrogen receptor positive (HCC) 02/20/2021   Carpal tunnel syndrome, bilateral 02/19/2019   Trigger finger of right thumb 02/19/2019   Flexor tenosynovitis of finger 02/19/2019   Mild concentric left ventricular hypertrophy (LVH) 12/24/2017   Mild aortic stenosis by prior echocardiogram 12/24/2017   Mild aortic regurgitation 12/24/2017   Uterine fibroid 01/11/2016  Allergic rhinitis, seasonal 05/24/2015   Benign essential HTN 05/24/2015   Chronic constipation 05/24/2015   Dyslipidemia 05/24/2015   Post-surgical hypothyroidism 05/24/2015   Mixed incontinence 05/24/2015   Microalbuminuria 05/24/2015   Menopause 05/24/2015   Obstructive apnea 05/24/2015   Morbid obesity (HCC) 05/24/2015   Hypertensive retinopathy 05/24/2015   Vitamin D deficiency 05/24/2015   Status post total replacement of right shoulder 03/11/2015    PCP: Alba Cory, MD  REFERRING PROVIDER: Venetia Night, MD  REFERRING DIAG:  M43.16 (ICD-10-CM) - Spondylolisthesis of lumbar region M54.16 (ICD-10-CM) - Lumbar radiculopathy M54.42,G89.29 (ICD-10-CM) - Chronic left-sided low back pain with left-sided  sciatica  Rationale for Evaluation and Treatment: Rehabilitation  THERAPY DIAG:  No diagnosis found.  ONSET DATE: chronic  SUBJECTIVE:                                                                                                                                                                                           SUBJECTIVE STATEMENT: Pt reports feeling increased pain in her knees, blames it on the rainy weather.   PERTINENT HISTORY:  Per Dr. Lucienne Capers note (09/24/2023), "chief complaint of lower back and left leg pain.  She has numbness into her left thigh.  She also has some right sided low back pain.  She has been having pain in her back and legs for nearly 2 years.  She has had bilateral knee pain for 2 to 3 months.  Her pain can be as bad as 6 out of 10.  Activity makes it worse.  Rest improves her pain."  PAIN:  Are you having pain? Yes: NPRS scale: 2-3/10 Pain location: Central spinous process of low back  Pain description: Achy  Aggravating factors: Walking or standing for a long time  Relieving factors: Sitting down     PRECAUTIONS: None  RED FLAGS: None   WEIGHT BEARING RESTRICTIONS: No  FALLS:  Has patient fallen in last 6 months? No  LIVING ENVIRONMENT: Lives with:  lives with sisters  Lives in: House/apartment Stairs: Yes: Internal: flight steps; on right going up and External: 6-7 steps; on right going up Has following equipment at home: None  OCCUPATION: retired   PLOF: Independent  PATIENT GOALS: learning techniques to help alleviate pain/discomfort    OBJECTIVE:  Note: Objective measures were completed at Evaluation unless otherwise noted.  DIAGNOSTIC FINDINGS:  EXAM: MRI LUMBAR SPINE WITHOUT CONTRAST  IMPRESSION: 1. New moderate-sized disc extrusion in the left subarticular zone at L3-4 with cephalad extension of disc material, moderate mass effect on the thecal sac and left L3 nerve root encroachment. 2. Slightly progressive disc  degeneration and  facet hypertrophy at L4-5 with persistent moderate to severe multifactorial spinal stenosis. Moderate to severe right foraminal narrowing appears slightly worse. 3. Stable mild multifactorial spinal stenosis at L1-2 and L2-3. 4. Stable mild left greater than right foraminal narrowing at L5-S1.  PATIENT SURVEYS:  FOTO 60 with goal of 64  12/12/23: 57  COGNITION: Overall cognitive status: Within functional limits for tasks assessed     SENSATION: Light touch: Impaired  - numbness noted to L inner thigh (L3 dermatome)  MUSCLE LENGTH: Equal hamstring tightness bilaterally Tightness noted to B piriformis (L>R)  POSTURE: rounded shoulders and forward head  PALPATION: To be further assessed at visit #2     LUMBAR ROM:   AROM eval  Flexion WFL  Extension WFL   Right lateral flexion WFL   Left lateral flexion WFL  Right rotation WFL   Left rotation WFL    (Blank rows = not tested)   LOWER EXTREMITY MMT:    MMT Right eval Left eval Right 12/12/23 Left 12/12/23  Hip flexion 4 4 4+ 4+  Hip extension 4- 4- 4 4  Hip abduction 4 4 4+ 4+  Hip adduction 4 4 4 4   Hip internal rotation 4 4 4+ 4+  Hip external rotation 4 4 4+ 4+  Knee flexion 4+ 4+    Knee extension 4+ 4+    Ankle dorsiflexion 4+ 4+    Ankle plantarflexion      Ankle inversion      Ankle eversion       (Blank rows = not tested)  LUMBAR SPECIAL TESTS:  To be assessed visit #2 Straight Leg Raise:             FADIR Negative on LLE             FABER Negative on LLE       FUNCTIONAL TESTS:  5 times sit to stand: 16.28 seconds  6 minute walk test: 1,200 ft  12/12/23: 1,471 ft   GAIT: Distance walked: 37' Assistive device utilized: None Level of assistance: Complete Independence Comments: no obvious deviations, self reports when fatigued that she feels that she "walks funny"   TREATMENT DATE:   12/25/23: TherEx Recumbent bike with resistance at 3 for 6 min  OMEGA Leg Press #85  2x10 OMEGA Leg Press #90 2x10  OMEGA Pallof Press #12.5 3x10  Standing hip abduction with red band 1x10   -pt reported too easy  Standing hip abduction with green band 3x10  Dead bugs with #3 DB in each hand 1x10   -pt had trouble connecting what hand and leg to drop Dead bugs with red ball between hands and legs 3x10 Weighted crunches with #5 DB 2x10  12/19/23: THEREX  Recumbent bike with resistance at 3 for 6 min OMEGA Leg Press #80 3 x 10  Bridges with ball squeeze and with knee extension 3 x 10  Standing Hip Abduction 3 x 10  Standing Mini-Squat with #5 DB at 90 deg flexion 2 x 10  Weighted Crunches with #5 DB 1 x 10  Weighted Crunches with #5 x 2 DB 1 x 10    12/17/23: THEREX Recumbent bike with resistance at 3 for 6 min OMEGA Pallof press #5 1x10 OMEGA Pallof press #10 2x10   -when coming from the right, a little leaning taking stress off abs  OMEGA Leg Press #65 1x10 OMEGA Leg Press #75 4x10  Monster walks with yellow band 2x11m Bridges with ball between knees 2x10  Bridges with ball between knees with 3 sec hold at the top 2x10   PHYSICAL PERFORMANCE 5 STS 13.26 sec  12/12/23: Recumbent bike with resistance at 3 for 6 min  FOTO 57 Reassess Hip MMT *see above* 6 MWT 1420ft  Figure 4 bridges 2x10  Dead bugs 2x10   December 15, 2023: Recumbent bike for 5 minutes with seat at 10 and resistance at 3 for 6 min Bird Dog 2 x 10  Bird Dog 2 x 5 - Pt unable to fully extend hips with knees straight  Bird Dog with bent knee 1 x 10  OMEGA Palloff Press with #5 4 x 10  OMEGA Palloff Press with #5 with romberg stance #5 2 x 10  Monster Walks with yellow band 10 M x 2   -Pt reports increased right sided radicular pain on LLE   Side Lying Hip Abduction 2 x 10  -Pt reports increased radicular pain on LLE with NRPS 9/10  Calm Shells with blue band 1 x 10  -NRPS 2/10    12/04/23: Recumbent bike for 3 minutes with seat 10 with resistance 3  Recumbent bike for 5 minutes for intensity  with resistance 3 Side stepping with red band 6x10 ft  -pt took seated break after 4th set Marches with BUE support 1x10  Marches with BUE support with #3 AW 1x10  Marches with BUE support with #5 AW 1x10  Squats to chair with Airex pad 3x10  12/02/23: Nu-step with seat at 9 with resistance 3 - 5 min  Hip ER R/L 4/4  Hip IR R/L 4/4  Side Lying Hip Abduction 1 x 10  -Pt is unable to not externally rotate hips to maintain hip rotation  Clam Shell with green band 3 x 10  Standing Hip Extension with BUE support 2 x 10 Supine Abdominal Reach 3 x 10  Nu-Step with seat and arms at 9 with 81 SPM with resistance at 3 for 5 min  -Song test to gauge intensity    PATIENT EDUCATION:  Education details: HEP, POC, goals  Person educated: Patient Education method: Medical illustrator Education comprehension: verbalized understanding and returned demonstration  HOME EXERCISE PROGRAM: Access Code: [facility] URL: [email or web address] Date: 12/25/2023 Prepared by: Consuelo Pandy  Exercises - Seated Piriformis Stretch  - 2-3 x daily - 3-4 x weekly - 3 reps - 60 second hold - Seated Hamstring Stretch  - 1 x daily - 3-4 x weekly - 3 reps - 60 seccond hold - Supine Lower Trunk Rotation  - 2-3 x daily - 7 x weekly - 10 reps - Ab Prep  - 3-4 x weekly - 3 sets - 10 reps - Dead Bug  - 1 x daily - 3-4 x weekly - 3 sets - 10 reps - Supine Bridge with Leg Extension  - 3-4 x weekly - 3 sets - 10 reps - Standing Hip Abduction with Resistance at Ankles and Counter Support  - 1 x daily - 7 x weekly - 3 sets - 10 reps  ASSESSMENT:  CLINICAL IMPRESSION: Pt continues to demonstrate an improvement in hip and abdominal strength with progression of resistance in HEP and increased weight with leg press with no additional pain. Pt reports lower levels of pain compared to when she started PT. Pt continues to progress towards her goals and towards being ready for discharge if she shows  consistency with completing her exercises and keeping her pain levels down during activities of prolonged standing and walking.   OBJECTIVE  IMPAIRMENTS: Abnormal gait, decreased activity tolerance, decreased endurance, decreased mobility, difficulty walking, decreased ROM, decreased strength, hypomobility, impaired sensation, postural dysfunction, and pain.   ACTIVITY LIMITATIONS: carrying, lifting, sitting, standing, and transfers  PARTICIPATION LIMITATIONS: cleaning, laundry, and community activity  PERSONAL FACTORS: Age, Behavior pattern, Past/current experiences, Time since onset of injury/illness/exacerbation, and 3+ comorbidities: hx of breast cancer, HTN, obesity, CKD stage 3a  are also affecting patient's functional outcome.   REHAB POTENTIAL: Good  CLINICAL DECISION MAKING: Stable/uncomplicated  EVALUATION COMPLEXITY: Low   GOALS: Goals reviewed with patient? Yes  SHORT TERM GOALS: Target date: 12/17/2023  Patient will be independent in HEP to improve strength/mobility for better functional independence with ADLs. Baseline:1/14: HEP initiated 12/12/23: able to complete independently  Goal status: ACHIEVED    LONG TERM GOALS: Target date: 01/07/2024  Patient will increase FOTO score to equal to or greater than 64 to demonstrate statistically significant improvement in mobility and quality of life.  Baseline: 1/14: 60 12/12/23: 57  Goal status: ONGOING   2.  Patient (> 10 years old) will complete five times sit to stand test in < 13 seconds indicating an increased LE strength and improved balance. Baseline: 1/14: 16.28 seconds 12/17/23: 13.26 seconds Goal status: ACHIEVED   3.  Patient will increase six minute walk test distance by 200' for to improve activity tolerance and gait ability  Baseline: 1/14: 1,200 ft 12/12/23: 1471 Goal status: ACHIEVED   4.  Patient will increase BLE gross strength to 4+/5 as to improve functional strength for increased standing tolerance and  increased ADL ability. Baseline: 1/14: see chart above  12/12/23: see chart above  Goal status: ONGOING   5.  Patient will report a worst pain of 3/10 on NRPS in low back and LLE to improve tolerance with ADLs and reduced symptoms with activities. Baseline: 1/14: 10/10 pain  Goal status: ONGOING    PLAN:  PT FREQUENCY: 1-2x/week  PT DURATION: 6 weeks  PLANNED INTERVENTIONS: 97164- PT Re-evaluation, 97110-Therapeutic exercises, 97530- Therapeutic activity, 97112- Neuromuscular re-education, 97535- Self Care, 16109- Manual therapy, [id number]- Gait training, 97014- Electrical stimulation (unattended), [phone]- Electrical stimulation (manual), Patient/Family education, Dry Needling, Joint mobilization, Joint manipulation, Spinal manipulation, Spinal mobilization, DME instructions, Cryotherapy, and Moist heat.  PLAN FOR NEXT SESSION:  Reassess goals. Advance leg press weight. Check in on pain levels.    Consuelo Pandy, [place], DPT  Physical Therapist - [facility]  12/25/2023, 4:01 PM

## 2023-12-25 NOTE — Patient Instructions (Signed)
Goals Addressed             This Visit's Progress    Pharmacy Goals       If you need to reach out to patient assistance programs regarding refills or to find out the status of your application, you can do so by calling:   [employer] at [phone] [employer] at [phone]   Our goal A1c is less than 7%. This corresponds with fasting sugars less than 130 and 2 hour after meal sugars less than 180. Please keep a log of your results when checking your blood sugar    Check your blood pressure once weekly, and any time you have concerning symptoms like headache, chest pain, dizziness, shortness of breath, or vision changes.   Our goal is less than 130/80.  To appropriately check your blood pressure, make sure you do the following:  1) Avoid caffeine, exercise, or tobacco products for 30 minutes before checking. Empty your bladder. 2) Sit with your back supported in a flat-backed chair. Rest your arm on something flat (arm of the chair, table, etc). 3) Sit still with your feet flat on the floor, resting, for at least 5 minutes.  4) Check your blood pressure. Take 1-2 readings.  5) Write down these readings and bring with you to any provider appointments.  Bring your home blood pressure machine with you to a provider's office for accuracy comparison at least once a year.   Make sure you take your blood pressure medications before you come to any office visit, even if you were asked to fast for labs.  Thank you!   Estelle Grumbles, PharmD, [facility] [phone]

## 2023-12-25 NOTE — Progress Notes (Signed)
12/25/2023 Name: Andrea Randolph: [id number] DOB: 1955-12-08  Chief Complaint  Patient presents with   Medication Assistance    Andrea Randolph is a 68 y.o. year old female who presented for a telephone visit.   They were referred to the pharmacist by their PCP for assistance in managing medication access.    Conversation limited today as patient reports that she is not currently home   Subjective:   Care Team: Primary Care Provider: Alba Cory, MD ; Next Scheduled Visit: 01/15/2024 Oncology: Rickard Patience, MD  Rheumatology: Tracey Harries, MD  Neurosurgery: Venetia Night, MD Physical Medicine and Rehabilitation: Yves Dill Berneda Rose, DO  Cardiologist: Julien Nordmann, MD; Next Scheduled Visit: 01/13/2024  Medication Access/Adherence  Current Pharmacy:  [facility] [address] [place] - [address] [place] [address] Phone: [phone] Fax: [phone]  [facility] - [place], [place] - [address] [place] [address] Phone: [phone] Fax: [phone]  [id number] - [place], [place] - [address] [place] [address] Phone: [phone] Fax: [phone]  [facility] - [place], [place] - [address] [place] [address] Phone: [phone] Fax: [phone]   Patient reports affordability concerns with their medications: No  Patient reports access/transportation concerns to their pharmacy: No  Patient reports adherence concerns with their medications:  No       Diabetes:   Current medications:  - Farxiga 10 mg daily - Rybelsus 14 mg daily Confirms takes on an empty stomach, >=30 minutes before the first food, beverage, or other oral medications of the day with <=4 oz of plain water only   Denies checking home blood sugar recently   Patient denies hypoglycemic s/sx including dizziness, shakiness, sweating.    Current  medication access support:  - Enrolled in patient assistance for Farxiga from [employer] through 11/11/2024 - Collaborating with PCP and CPhT to aid patient with re-enrollment in patient assistance for Rybelsus from [employer] for 2025  From review of chart, note CPhT faxed application to [employer] on 12/13/2023 Today patient reports she has >1 month of Rybelsus remaining    Hypertension:   Current medications: olmesartan-amlodipine-HCTZ 40-5-25 mg daily   Patient has an automated, upper arm home BP cuff, but denies checking recently   Patient denies hypotensive s/sx including dizziness, lightheadedness.      Hyperlipidemia/ASCVD Risk Reduction   Current lipid lowering medications:  - Praluent 150 mg injection every 14 days - ezetimibe 10 mg daily     Antiplatelet regimen: aspirin 81 mg   ASCVD History: Per review of Cardiology notes, Cardiac CTA in 2023 showed mild nonobstructive CAD    Current medication access support: Enrolled in [facility] for [employer]. From review of chart, apprears enrollment ends 02/29/2024   Objective:  Lab Results  Component Value Date   HGBA1C 7.1 (A) 11/15/2023    Lab Results  Component Value Date   CREATININE 1.21 (H) 09/10/2023   BUN 15 09/10/2023   NA 138 09/10/2023   K 3.4 (L) 09/10/2023   CL 103 09/10/2023   CO2 27 09/10/2023    Lab Results  Component Value Date   CHOL 156 07/11/2023   HDL 55 07/11/2023   LDLCALC 85 07/11/2023   TRIG 72 07/11/2023   CHOLHDL 2.8 07/11/2023   BP Readings from Last 3 Encounters:  11/15/23 118/68  09/24/23 122/82  09/10/23 (!) 142/81   Pulse Readings from Last 3 Encounters:  11/15/23 90  09/10/23 82  07/29/23 97     Current Outpatient Medications on File Prior to Visit  Medication Sig Dispense Refill   dapagliflozin propanediol (FARXIGA) 10 MG TABS tablet Take 1 tablet (10 mg total) by mouth daily. Through [employer] Patient Assistance 90 tablet 3    Olmesartan-amLODIPine-HCTZ 40-5-25 MG TABS Take 1 tablet by mouth daily at 12 noon. 90 tablet 1   Semaglutide (RYBELSUS) 14 MG TABS Take 1 tablet (14 mg total) by mouth daily at 6 (six) AM. Through [employer] Patient Assistance 90 tablet 0   acetaminophen (TYLENOL) 500 MG tablet Take 500 mg by mouth every 6 (six) hours as needed.     Alirocumab (PRALUENT) 150 MG/ML SOAJ Inject 1 mL (150 mg total) into the skin every 14 (fourteen) days. 6 mL 1   aspirin 81 MG tablet Take 81 mg by mouth daily.     Calcium Carb-Cholecalciferol (CALCIUM 500+D3 PO) Take 1 tablet by mouth daily.     Cholecalciferol (VITAMIN D3) 1000 units CAPS Take 1,000 Units by mouth daily.     clindamycin (CLEOCIN T) 1 % external solution Apply 1 Application topically every morning.     cyanocobalamin (VITAMIN B12) 1000 MCG tablet Take 1,000 mcg by mouth daily.     diclofenac Sodium (VOLTAREN) 1 % GEL Apply 2 g topically 4 (four) times daily as needed. 100 g 2   exemestane (AROMASIN) 25 MG tablet Take 1 tablet (25 mg total) by mouth daily. 90 tablet 1   ezetimibe (ZETIA) 10 MG tablet Take 1 tablet (10 mg total) by mouth daily. 90 tablet 1   glucose blood test strip      levothyroxine (SYNTHROID) 125 MCG tablet TAKE 1 TABLET BY MOUTH DAILY 30 tablet 0   tretinoin (RETIN-A) 0.025 % cream Apply 1 Application topically daily.     No current facility-administered medications on file prior to visit.       Assessment/Plan:   Diabetes: - Currently controlled - Reviewed long term cardiovascular and renal outcomes of uncontrolled blood sugar - Patient to have regular well-balanced meals and snacks throughout the day, while controlling carbohydrate portion sizes - Collaborating with PCP and CPhT to aid patient with re-enrollment in patient assistance for Rybelsus from [employer] for 2025     Hypertension: - Recommend to monitor home blood pressure, keep log of results and have this record to review at upcoming medical  appointments. Patient to contact provider office sooner if needed for readings outside of established parameters or symptoms     Hyperlipidemia/ASCVD Risk Reduction: - Encourage patient to continue to take Praluent and ezetimibe as directed - Will follow up again to assist patient with re-enrollment in [facility] grant before current grant enrollment ends ~02/29/2024       Follow Up Plan: Clinical Pharmacist will follow up with patient by telephone on 02/05/2024 at 10:00 AM    Estelle Grumbles, PharmD, [facility] [phone]

## 2023-12-26 ENCOUNTER — Ambulatory Visit: Payer: Medicare HMO | Admitting: Physical Therapy

## 2023-12-27 NOTE — Telephone Encounter (Signed)
RE-Faxed  PROVIDER PG 3 OF application MISSING DIRECTION ON PAGE [employer] at [phone]

## 2023-12-30 ENCOUNTER — Ambulatory Visit: Payer: Medicare HMO | Admitting: Physical Therapy

## 2023-12-30 DIAGNOSIS — M5416 Radiculopathy, lumbar region: Secondary | ICD-10-CM | POA: Diagnosis not present

## 2023-12-30 DIAGNOSIS — M5459 Other low back pain: Secondary | ICD-10-CM

## 2023-12-30 DIAGNOSIS — M6281 Muscle weakness (generalized): Secondary | ICD-10-CM | POA: Diagnosis not present

## 2023-12-30 NOTE — Therapy (Signed)
 OUTPATIENT PHYSICAL THERAPY THORACOLUMBAR PROGRESS   Dates of Reporting: 11/26/23-12/30/23  Patient Name: Andrea Randolph MRN: [id number] DOB:[birth year], 68 y.o., female Today's Date: 12/30/2023  END OF SESSION:  PT End of Session - 12/30/23 1604     Visit Number 10    Number of Visits 20    Date for PT Re-Evaluation 01/07/24    Authorization Type [employer] 2025    Authorization - Visit Number 10    Authorization - Number of Visits 20    Progress Note Due on Visit 10    PT Start Time 1600    PT Stop Time 1645    PT Time Calculation (min) 45 min    Activity Tolerance Patient tolerated treatment well    Behavior During Therapy WFL for tasks assessed/performed                Past Medical History:  Diagnosis Date   Allergy    Anemia    Arthritis of right shoulder region    Dr. Katrinka Blazing   Cancer [facility])    breast cancer right side   Chronic insomnia    Diabetes mellitus without complication (HCC)    Family history of adverse reaction to anesthesia    sister with Nausea   GERD (gastroesophageal reflux disease)    Goiter    Dr. Tedd Sias   Heart murmur    Hyperlipidemia    Hypertension    Hypertensive retinopathy of left eye    Hypothyroidism, adult    Dr. Tedd Sias   Inflammation of urethra    Menopause    Microalbuminuria    DM   Mixed incontinence    Muscle cramps    Obesity    OSA (obstructive sleep apnea)    Personal history of radiation therapy    Breast Cancer Right breast   Radiculitis, lumbosacral    Vitamin D deficiency    Past Surgical History:  Procedure Laterality Date   BREAST BIOPSY Left 90's   benign core needle    BREAST BIOPSY Right 02/07/2021   [place] Bx, Q-clip, DCIS   BREAST LUMPECTOMY Right 03/06/2021   BREAST LUMPECTOMY WITH SENTINEL LYMPH NODE BIOPSY Right 03/06/2021   Procedure: BREAST LUMPECTOMY WITH SENTINEL LYMPH NODE BX;  Surgeon: Earline Mayotte, MD;  Location: [facility] ORS;  Service: General;  Laterality: Right;   COLONOSCOPY WITH  PROPOFOL N/A 01/26/2019   Procedure: COLONOSCOPY WITH PROPOFOL;  Surgeon: Toney Reil, MD;  Location: [facility] ENDOSCOPY;  Service: Gastroenterology;  Laterality: N/A;   COLONOSCOPY WITH PROPOFOL N/A 02/09/2022   Procedure: COLONOSCOPY WITH PROPOFOL;  Surgeon: Wyline Mood, MD;  Location: [facility] ENDOSCOPY;  Service: Gastroenterology;  Laterality: N/A;   FOOT SURGERY Bilateral    heel spurs   JOINT REPLACEMENT     TOTAL SHOULDER REPLACEMENT Right 01/25/2015   Dr. Joice Lofts   TOTAL THYROIDECTOMY  08/01/2018   TRIGGER FINGER RELEASE Right 06/10/2023   ring finger - Dr. Rosita Kea   Patient Active Problem List   Diagnosis Date Noted   Diabetes mellitus with microalbuminuria (HCC) 11/15/2023   Chronic bilateral low back pain with left-sided sciatica 07/11/2023   Genetic testing 03/28/2022   Stage 3a chronic kidney disease (CKD) (HCC) 03/07/2022   Aromatase inhibitor use 05/30/2021   Malignant 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, estrogen receptor positive (HCC) 02/20/2021   Carpal tunnel syndrome, bilateral 02/19/2019   Trigger finger of right thumb 02/19/2019   Flexor tenosynovitis of finger 02/19/2019   Mild concentric left  ventricular hypertrophy (LVH) 12/24/2017   Mild aortic stenosis by prior echocardiogram 12/24/2017   Mild aortic regurgitation 12/24/2017   Uterine fibroid 01/11/2016   Allergic rhinitis, seasonal 05/24/2015   Benign essential HTN 05/24/2015   Chronic constipation 05/24/2015   Dyslipidemia 05/24/2015   Post-surgical hypothyroidism 05/24/2015   Mixed incontinence 05/24/2015   Microalbuminuria 05/24/2015   Menopause 05/24/2015   Obstructive apnea 05/24/2015   Morbid obesity (HCC) 05/24/2015   Hypertensive retinopathy 05/24/2015   Vitamin D deficiency 05/24/2015   Status post total replacement of right shoulder 03/11/2015    PCP: Alba Cory, MD  REFERRING PROVIDER: Venetia Night, MD  REFERRING DIAG:  M43.16 (ICD-10-CM) -  Spondylolisthesis of lumbar region M54.16 (ICD-10-CM) - Lumbar radiculopathy M54.42,G89.29 (ICD-10-CM) - Chronic left-sided low back pain with left-sided sciatica  Rationale for Evaluation and Treatment: Rehabilitation  THERAPY DIAG:  No diagnosis found.  ONSET DATE: chronic  SUBJECTIVE:                                                                                                                                                                                           SUBJECTIVE STATEMENT: Pt states that she continues to experience slight pain but nothing like she felt when she started PT. She reports being able to walk for longer distances.   PERTINENT HISTORY:  Per Dr. Lucienne Capers note (09/24/2023), "chief complaint of lower back and left leg pain.  She has numbness into her left thigh.  She also has some right sided low back pain.  She has been having pain in her back and legs for nearly 2 years.  She has had bilateral knee pain for 2 to 3 months.  Her pain can be as bad as 6 out of 10.  Activity makes it worse.  Rest improves her pain."  PAIN:  Are you having pain? Yes: NPRS scale: 2-3/10 Pain location: Central spinous process of low back  Pain description: Achy  Aggravating factors: Walking or standing for a long time  Relieving factors: Sitting down     PRECAUTIONS: None  RED FLAGS: None   WEIGHT BEARING RESTRICTIONS: No  FALLS:  Has patient fallen in last 6 months? No  LIVING ENVIRONMENT: Lives with:  lives with sisters  Lives in: House/apartment Stairs: Yes: Internal: flight steps; on right going up and External: 6-7 steps; on right going up Has following equipment at home: None  OCCUPATION: retired   PLOF: Independent  PATIENT GOALS: learning techniques to help alleviate pain/discomfort    OBJECTIVE:  Note: Objective measures were completed at Evaluation unless otherwise noted.  DIAGNOSTIC FINDINGS:  EXAM: MRI LUMBAR SPINE WITHOUT  CONTRAST  IMPRESSION: 1. New moderate-sized disc extrusion in the left subarticular zone at L3-4 with cephalad extension of disc material, moderate mass effect on the thecal sac and left L3 nerve root encroachment. 2. Slightly progressive disc degeneration and facet hypertrophy at L4-5 with persistent moderate to severe multifactorial spinal stenosis. Moderate to severe right foraminal narrowing appears slightly worse. 3. Stable mild multifactorial spinal stenosis at L1-2 and L2-3. 4. Stable mild left greater than right foraminal narrowing at L5-S1.  PATIENT SURVEYS:  FOTO 60 with goal of 64  12/12/23: 57  COGNITION: Overall cognitive status: Within functional limits for tasks assessed     SENSATION: Light touch: Impaired  - numbness noted to L inner thigh (L3 dermatome)  MUSCLE LENGTH: Equal hamstring tightness bilaterally Tightness noted to B piriformis (L>R)  POSTURE: rounded shoulders and forward head  PALPATION: To be further assessed at visit #2     LUMBAR ROM:   AROM eval  Flexion WFL  Extension WFL   Right lateral flexion WFL   Left lateral flexion WFL  Right rotation [facility]   Left rotation WFL    (Blank rows = not tested)   LOWER EXTREMITY MMT:    MMT Right eval Left eval Right 12/12/23 Left 12/12/23 Right  12/30/23 Left  12/30/23  Hip flexion 4 4 4+ 4+    Hip extension 4- 4- 4 4 4+ 4+  Hip abduction 4 4 4+ 4+    Hip adduction 4 4 4 4  4+ 4+  Hip internal rotation 4 4 4+ 4+    Hip external rotation 4 4 4+ 4+    Knee flexion 4+ 4+      Knee extension 4+ 4+      Ankle dorsiflexion 4+ 4+      Ankle plantarflexion        Ankle inversion        Ankle eversion         (Blank rows = not tested)  LUMBAR SPECIAL TESTS:  To be assessed visit #2 Straight Leg Raise:             FADIR Negative on LLE             FABER Negative on LLE       FUNCTIONAL TESTS:  5 times sit to stand: 16.28 seconds  6 minute walk test: 1,200 ft  12/12/23: 1,471  ft   GAIT: Distance walked: 22' Assistive device utilized: None Level of assistance: Complete Independence Comments: no obvious deviations, self reports when fatigued that she feels that she "walks funny"   TREATMENT DATE:   12/30/23: TherEx Recumbent bike with resistance at 3 for 6 min  OMEGA Leg Press #105 2 x 10  Standing Hip Abduction 2 x 10 with red band  Standing Hip Abduction 2 x 10 with green band OMEGA Knee Ext #15 1 x 10  OMEGA Knee Ext #20 1 x 10  OMEGA Knee Flex #35 3 x 10  Palloff Press  #10 2 x 10  -Pt reports increased LLE nerve pain    12/25/23: TherEx Recumbent bike with resistance at 3 for 6 min  OMEGA Leg Press #85 2x10 OMEGA Leg Press #90 2x10  OMEGA Pallof Press #12.5 3x10  Standing hip abduction with red band 1x10   -pt reported too easy  Standing hip abduction with green band 3x10  Dead bugs with #3 DB in each hand 1x10   -pt had trouble connecting what hand and leg to drop Dead bugs with  red ball between hands and legs 3x10 Weighted crunches with #5 DB 2x10  12/19/23: THEREX  Recumbent bike with resistance at 3 for 6 min OMEGA Leg Press #80 3 x 10  Bridges with ball squeeze and with knee extension 3 x 10  Standing Hip Abduction 3 x 10  Standing Mini-Squat with #5 DB at 90 deg flexion 2 x 10  Weighted Crunches with #5 DB 1 x 10  Weighted Crunches with #5 x 2 DB 1 x 10    12/17/23: THEREX Recumbent bike with resistance at 3 for 6 min OMEGA Pallof press #5 1x10 OMEGA Pallof press #10 2x10   -when coming from the right, a little leaning taking stress off abs  OMEGA Leg Press #65 1x10 OMEGA Leg Press #75 4x10  Monster walks with yellow band 2x58m Bridges with ball between knees 2x10  Bridges with ball between knees with 3 sec hold at the top 2x10   PHYSICAL PERFORMANCE 5 STS 13.26 sec  12/12/23: Recumbent bike with resistance at 3 for 6 min  FOTO 57 Reassess Hip MMT *see above* 6 MWT 144ft  Figure 4 bridges 2x10  Dead bugs 2x10    2023/12/14: Recumbent bike for 5 minutes with seat at 10 and resistance at 3 for 6 min Bird Dog 2 x 10  Bird Dog 2 x 5 - Pt unable to fully extend hips with knees straight  Bird Dog with bent knee 1 x 10  OMEGA Palloff Press with #5 4 x 10  OMEGA Palloff Press with #5 with romberg stance #5 2 x 10  Monster Walks with yellow band 10 M x 2   -Pt reports increased right sided radicular pain on LLE   Side Lying Hip Abduction 2 x 10  -Pt reports increased radicular pain on LLE with NRPS 9/10  Calm Shells with blue band 1 x 10  -NRPS 2/10     PATIENT EDUCATION:  Education details: HEP, POC, goals  Person educated: Patient Education method: Medical illustrator Education comprehension: verbalized understanding and returned demonstration  HOME EXERCISE PROGRAM: Access Code: [email or web address] URL: [email or web address] Date: 12/30/2023 Prepared by: Ellin Goodie  Exercises - Seated Piriformis Stretch  - 2-3 x daily - 3-4 x weekly - 3 reps - 60 second hold - Seated Hamstring Stretch  - 1 x daily - 3-4 x weekly - 3 reps - 60 seccond hold - Supine Lower Trunk Rotation  - 2-3 x daily - 7 x weekly - 10 reps - Ab Prep  - 3-4 x weekly - 3 sets - 10 reps - Dead Bug  - 1 x daily - 3-4 x weekly - 3 sets - 10 reps - Supine Bridge with Leg Extension  - 3-4 x weekly - 3 sets - 10 reps - Standing Hip Abduction with Resistance at Ankles and Counter Support  - 3-4 x weekly - 3 sets - 10 reps - Full Leg Press with Resistance Around Knees  - 3-4 x weekly - 3 sets - 10 reps - Knee Extension with Weight Machine  - 3-4 x weekly - 3 sets - 10 reps - Hamstring Curl with Weight Machine  - 3-4 x weekly - 3 sets - 10 reps  ASSESSMENT:  CLINICAL IMPRESSION: Pt has nearly met all of her rehab goals with an improvement in hip strength and decrease in pain with activity. PT is working on transitioning pt to completing exercises at gym. Unable to completely finish demonstrating machines patients  should complete, so should  be able to complete next session. She will benefit from one additional apt to finalize home exercise plan.   OBJECTIVE IMPAIRMENTS: Abnormal gait, decreased activity tolerance, decreased endurance, decreased mobility, difficulty walking, decreased ROM, decreased strength, hypomobility, impaired sensation, postural dysfunction, and pain.   ACTIVITY LIMITATIONS: carrying, lifting, sitting, standing, and transfers  PARTICIPATION LIMITATIONS: cleaning, laundry, and community activity  PERSONAL FACTORS: Age, Behavior pattern, Past/current experiences, Time since onset of injury/illness/exacerbation, and 3+ comorbidities: hx of breast cancer, HTN, obesity, CKD stage 3a  are also affecting patient's functional outcome.   REHAB POTENTIAL: Good  CLINICAL DECISION MAKING: Stable/uncomplicated  EVALUATION COMPLEXITY: Low   GOALS: Goals reviewed with patient? Yes  SHORT TERM GOALS: Target date: 12/17/2023  Patient will be independent in HEP to improve strength/mobility for better functional independence with ADLs. Baseline:1/14: HEP initiated 12/12/23: able to complete independently  Goal status: ACHIEVED    LONG TERM GOALS: Target date: 01/07/2024  Patient will increase FOTO score to equal to or greater than 64 to demonstrate statistically significant improvement in mobility and quality of life.  Baseline: 1/14: 60 12/12/23: 57  Goal status: ONGOING   2.  Patient (> 66 years old) will complete five times sit to stand test in < 13 seconds indicating an increased LE strength and improved balance. Baseline: 1/14: 16.28 seconds 12/17/23: 13.26 seconds Goal status: ACHIEVED   3.  Patient will increase six minute walk test distance by 200' for to improve activity tolerance and gait ability  Baseline: 1/14: 1,200 ft 12/12/23: 1471 Goal status: ACHIEVED   4.  Patient will increase BLE gross strength to 4+/5 as to improve functional strength for increased standing tolerance  and increased ADL ability. Baseline: 1/14: see chart above  12/12/23: see chart above 12/30/23: See chart above  Goal status: ACHIEVED   5.  Patient will report a worst pain of 3/10 on NRPS in low back and LLE to improve tolerance with ADLs and reduced symptoms with activities. Baseline: 1/14: 10/10 pain 12/30/23: 3/10  Goal status: ACHIEVED    PLAN:  PT FREQUENCY: 1-2x/week  PT DURATION: 6 weeks  PLANNED INTERVENTIONS: 97164- PT Re-evaluation, 97110-Therapeutic exercises, 97530- Therapeutic activity, 97112- Neuromuscular re-education, 97535- Self Care, 16109- Manual therapy, 97116- Gait training, 97014- Electrical stimulation (unattended), [phone]- Electrical stimulation (manual), Patient/Family education, Dry Needling, Joint mobilization, Joint manipulation, Spinal manipulation, Spinal mobilization, DME instructions, Cryotherapy, and Moist heat.  PLAN FOR NEXT SESSION:  Take FOTO and finish using machines that patient will carry over into gym workout   Johnn Hai, PT, DPT  Physical Therapist - [facility]  [facility]  12/30/2023, 4:05 PM

## 2024-01-01 ENCOUNTER — Ambulatory Visit: Payer: Medicare HMO | Admitting: Physical Therapy

## 2024-01-02 ENCOUNTER — Ambulatory Visit: Payer: Medicare HMO | Admitting: Physical Therapy

## 2024-01-02 DIAGNOSIS — M5459 Other low back pain: Secondary | ICD-10-CM

## 2024-01-02 DIAGNOSIS — M6281 Muscle weakness (generalized): Secondary | ICD-10-CM | POA: Diagnosis not present

## 2024-01-02 DIAGNOSIS — M5416 Radiculopathy, lumbar region: Secondary | ICD-10-CM

## 2024-01-02 NOTE — Telephone Encounter (Signed)
 PAP: Patient assistance application for [employer] has been approved by PAP Companies: [employer] from 02/202/2025 to 11/11/2024. Medication should be delivered to PAP Delivery: Provider's office. For further shipping updates, please [employer] at [phone]. Patient ID is: no id   PLEASE BE ADVISED

## 2024-01-02 NOTE — Therapy (Signed)
 OUTPATIENT PHYSICAL THERAPY THORACOLUMBAR DISCHARGE    Patient Name: Andrea Randolph MRN: [id number] DOB:01/28/1956, 68 y.o., female Today's Date: 01/02/2024  END OF SESSION:  PT End of Session - 01/02/24 1646     Visit Number 11    Number of Visits 20    Date for PT Re-Evaluation 01/07/24    Authorization Type [employer] 2025    Authorization - Visit Number 11    Authorization - Number of Visits 20    Progress Note Due on Visit 20    PT Start Time 1600    PT Stop Time 1645    PT Time Calculation (min) 45 min    Activity Tolerance Patient tolerated treatment well    Behavior During Therapy WFL for tasks assessed/performed                 Past Medical History:  Diagnosis Date   Allergy    Anemia    Arthritis of right shoulder region    Dr. Katrinka Blazing   Cancer [facility])    breast cancer right side   Chronic insomnia    Diabetes mellitus without complication (HCC)    Family history of adverse reaction to anesthesia    sister with Nausea   GERD (gastroesophageal reflux disease)    Goiter    Dr. Tedd Sias   Heart murmur    Hyperlipidemia    Hypertension    Hypertensive retinopathy of left eye    Hypothyroidism, adult    Dr. Tedd Sias   Inflammation of urethra    Menopause    Microalbuminuria    DM   Mixed incontinence    Muscle cramps    Obesity    OSA (obstructive sleep apnea)    Personal history of radiation therapy    Breast Cancer Right breast   Radiculitis, lumbosacral    Vitamin D deficiency    Past Surgical History:  Procedure Laterality Date   BREAST BIOPSY Left 90's   benign core needle    BREAST BIOPSY Right 02/07/2021   [place] Bx, Q-clip, DCIS   BREAST LUMPECTOMY Right 03/06/2021   BREAST LUMPECTOMY WITH SENTINEL LYMPH NODE BIOPSY Right 03/06/2021   Procedure: BREAST LUMPECTOMY WITH SENTINEL LYMPH NODE BX;  Surgeon: Earline Mayotte, MD;  Location: [facility] ORS;  Service: General;  Laterality: Right;   COLONOSCOPY WITH PROPOFOL N/A 01/26/2019   Procedure:  COLONOSCOPY WITH PROPOFOL;  Surgeon: Toney Reil, MD;  Location: [facility] ENDOSCOPY;  Service: Gastroenterology;  Laterality: N/A;   COLONOSCOPY WITH PROPOFOL N/A 02/09/2022   Procedure: COLONOSCOPY WITH PROPOFOL;  Surgeon: Wyline Mood, MD;  Location: [facility] ENDOSCOPY;  Service: Gastroenterology;  Laterality: N/A;   FOOT SURGERY Bilateral    heel spurs   JOINT REPLACEMENT     TOTAL SHOULDER REPLACEMENT Right 01/25/2015   Dr. Joice Lofts   TOTAL THYROIDECTOMY  08/01/2018   TRIGGER FINGER RELEASE Right 06/10/2023   ring finger - Dr. Rosita Kea   Patient Active Problem List   Diagnosis Date Noted   Diabetes mellitus with microalbuminuria (HCC) 11/15/2023   Chronic bilateral low back pain with left-sided sciatica 07/11/2023   Genetic testing 03/28/2022   Stage 3a chronic kidney disease (CKD) (HCC) 03/07/2022   Aromatase inhibitor use 05/30/2021   Malignant 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, estrogen receptor positive (HCC) 02/20/2021   Carpal tunnel syndrome, bilateral 02/19/2019   Trigger finger of right thumb 02/19/2019   Flexor tenosynovitis of finger 02/19/2019   Mild concentric left ventricular hypertrophy (LVH)  12/24/2017   Mild aortic stenosis by prior echocardiogram 12/24/2017   Mild aortic regurgitation 12/24/2017   Uterine fibroid 01/11/2016   Allergic rhinitis, seasonal 05/24/2015   Benign essential HTN 05/24/2015   Chronic constipation 05/24/2015   Dyslipidemia 05/24/2015   Post-surgical hypothyroidism 05/24/2015   Mixed incontinence 05/24/2015   Microalbuminuria 05/24/2015   Menopause 05/24/2015   Obstructive apnea 05/24/2015   Morbid obesity (HCC) 05/24/2015   Hypertensive retinopathy 05/24/2015   Vitamin D deficiency 05/24/2015   Status post total replacement of right shoulder 03/11/2015    PCP: Alba Cory, MD  REFERRING PROVIDER: Venetia Night, MD  REFERRING DIAG:  M43.16 (ICD-10-CM) - Spondylolisthesis of lumbar region M54.16  (ICD-10-CM) - Lumbar radiculopathy M54.42,G89.29 (ICD-10-CM) - Chronic left-sided low back pain with left-sided sciatica  Rationale for Evaluation and Treatment: Rehabilitation  THERAPY DIAG:  No diagnosis found.  ONSET DATE: chronic  SUBJECTIVE:                                                                                                                                                                                           SUBJECTIVE STATEMENT: Pt reports ongoing decrease in pain.    PERTINENT HISTORY:  Per Dr. Lucienne Capers note (09/24/2023), "chief complaint of lower back and left leg pain.  She has numbness into her left thigh.  She also has some right sided low back pain.  She has been having pain in her back and legs for nearly 2 years.  She has had bilateral knee pain for 2 to 3 months.  Her pain can be as bad as 6 out of 10.  Activity makes it worse.  Rest improves her pain."  PAIN:  Are you having pain? Yes: NPRS scale: 2-3/10 Pain location: Central spinous process of low back  Pain description: Achy  Aggravating factors: Walking or standing for a long time  Relieving factors: Sitting down     PRECAUTIONS: None  RED FLAGS: None   WEIGHT BEARING RESTRICTIONS: No  FALLS:  Has patient fallen in last 6 months? No  LIVING ENVIRONMENT: Lives with:  lives with sisters  Lives in: House/apartment Stairs: Yes: Internal: flight steps; on right going up and External: 6-7 steps; on right going up Has following equipment at home: None  OCCUPATION: retired   PLOF: Independent  PATIENT GOALS: learning techniques to help alleviate pain/discomfort    OBJECTIVE:  Note: Objective measures were completed at Evaluation unless otherwise noted.  DIAGNOSTIC FINDINGS:  EXAM: MRI LUMBAR SPINE WITHOUT CONTRAST  IMPRESSION: 1. New moderate-sized disc extrusion in the left subarticular zone at L3-4 with cephalad extension of disc material, moderate mass effect on  the thecal  sac and left L3 nerve root encroachment. 2. Slightly progressive disc degeneration and facet hypertrophy at L4-5 with persistent moderate to severe multifactorial spinal stenosis. Moderate to severe right foraminal narrowing appears slightly worse. 3. Stable mild multifactorial spinal stenosis at L1-2 and L2-3. 4. Stable mild left greater than right foraminal narrowing at L5-S1.  PATIENT SURVEYS:  FOTO 60 with goal of 64  12/12/23: 57  COGNITION: Overall cognitive status: Within functional limits for tasks assessed     SENSATION: Light touch: Impaired  - numbness noted to L inner thigh (L3 dermatome)  MUSCLE LENGTH: Equal hamstring tightness bilaterally Tightness noted to B piriformis (L>R)  POSTURE: rounded shoulders and forward head  PALPATION: To be further assessed at visit #2     LUMBAR ROM:   AROM eval  Flexion WFL  Extension WFL   Right lateral flexion WFL   Left lateral flexion WFL  Right rotation [facility]   Left rotation WFL    (Blank rows = not tested)   LOWER EXTREMITY MMT:    MMT Right eval Left eval Right 12/12/23 Left 12/12/23 Right  12/30/23 Left  12/30/23  Hip flexion 4 4 4+ 4+    Hip extension 4- 4- 4 4 4+ 4+  Hip abduction 4 4 4+ 4+    Hip adduction 4 4 4 4  4+ 4+  Hip internal rotation 4 4 4+ 4+    Hip external rotation 4 4 4+ 4+    Knee flexion 4+ 4+      Knee extension 4+ 4+      Ankle dorsiflexion 4+ 4+      Ankle plantarflexion        Ankle inversion        Ankle eversion         (Blank rows = not tested)  LUMBAR SPECIAL TESTS:  To be assessed visit #2 Straight Leg Raise:             FADIR Negative on LLE             FABER Negative on LLE       FUNCTIONAL TESTS:  5 times sit to stand: 16.28 seconds  6 minute walk test: 1,200 ft  12/12/23: 1,471 ft   GAIT: Distance walked: 74' Assistive device utilized: None Level of assistance: Complete Independence Comments: no obvious deviations, self reports when fatigued that she feels that  she "walks funny"   TREATMENT DATE:   01/02/24: FOTO: 65  Nu-Step seat at 9 for 5 min  OMEGA Seated Rows #45 1 x 10  OMEGA Lat Pull Down #10 1 x 01   12/30/23: TherEx Recumbent bike with resistance at 3 for 6 min  OMEGA Leg Press #105 2 x 10  Standing Hip Abduction 2 x 10 with red band  Standing Hip Abduction 2 x 10 with green band OMEGA Knee Ext #15 1 x 10  OMEGA Knee Ext #20 1 x 10  OMEGA Knee Flex #35 3 x 10  Palloff Press  #10 2 x 10  -Pt reports increased LLE nerve pain    12/25/23: TherEx Recumbent bike with resistance at 3 for 6 min  OMEGA Leg Press #85 2x10 OMEGA Leg Press #90 2x10  OMEGA Pallof Press #12.5 3x10  Standing hip abduction with red band 1x10   -pt reported too easy  Standing hip abduction with green band 3x10  Dead bugs with #3 DB in each hand 1x10   -pt had trouble connecting what  hand and leg to drop Dead bugs with red ball between hands and legs 3x10 Weighted crunches with #5 DB 2x10  12/19/23: THEREX  Recumbent bike with resistance at 3 for 6 min OMEGA Leg Press #80 3 x 10  Bridges with ball squeeze and with knee extension 3 x 10  Standing Hip Abduction 3 x 10  Standing Mini-Squat with #5 DB at 90 deg flexion 2 x 10  Weighted Crunches with #5 DB 1 x 10  Weighted Crunches with #5 x 2 DB 1 x 10     PATIENT EDUCATION:  Education details: HEP, POC, goals  Person educated: Patient Education method: Medical illustrator Education comprehension: verbalized understanding and returned demonstration  HOME EXERCISE PROGRAM: Access Code: [email or web address] URL: [email or web address] Date: 01/02/2024 Prepared by: Ellin Goodie  Exercises - Seated Piriformis Stretch  - 2-3 x daily - 3-4 x weekly - 3 reps - 60 second hold - Seated Hamstring Stretch  - 1 x daily - 3-4 x weekly - 3 reps - 60 seccond hold - Supine Lower Trunk Rotation  - 2-3 x daily - 7 x weekly - 10 reps - Ab Prep  - 3-4 x weekly - 3 sets - 10 reps - Supine Bridge  with Leg Extension  - 3-4 x weekly - 3 sets - 10 reps - Standing Hip Abduction with Resistance at Ankles and Counter Support  - 3-4 x weekly - 3 sets - 10 reps - Full Leg Press with Resistance Around Knees  - 3-4 x weekly - 3 sets - 10 reps - Knee Extension with Weight Machine  - 3-4 x weekly - 3 sets - 10 reps - Hamstring Curl with Weight Machine  - 3-4 x weekly - 3 sets - 10 reps - Seated Row Cable Machine  - 3-4 x weekly - 3 sets - 10 reps - Low Trap Setting at Wall  - 3-4 x weekly - 3 sets - 5 reps - Supine Shoulder Flexion Extension Full Range AROM  - 3-4 x weekly - 3 sets - 10 reps - Bicep Curl to Shoulder Press with Dumbbells  - 3-4 x weekly - 3 sets - 10 reps  ASSESSMENT:  CLINICAL IMPRESSION: Pt has now met all of her rehab goals with an improvement in the perception of her low back function. She continues to experience decreased low back pain. PT educated pt on home exercise plan that includes extensor strengthening exercises for pt to continue to maintain strength gains.    OBJECTIVE IMPAIRMENTS: Abnormal gait, decreased activity tolerance, decreased endurance, decreased mobility, difficulty walking, decreased ROM, decreased strength, hypomobility, impaired sensation, postural dysfunction, and pain.   ACTIVITY LIMITATIONS: carrying, lifting, sitting, standing, and transfers  PARTICIPATION LIMITATIONS: cleaning, laundry, and community activity  PERSONAL FACTORS: Age, Behavior pattern, Past/current experiences, Time since onset of injury/illness/exacerbation, and 3+ comorbidities: hx of breast cancer, HTN, obesity, CKD stage 3a  are also affecting patient's functional outcome.   REHAB POTENTIAL: Good  CLINICAL DECISION MAKING: Stable/uncomplicated  EVALUATION COMPLEXITY: Low   GOALS: Goals reviewed with patient? Yes  SHORT TERM GOALS: Target date: 12/17/2023  Patient will be independent in HEP to improve strength/mobility for better functional independence with  ADLs. Baseline:1/14: HEP initiated 12/12/23: able to complete independently  Goal status: ACHIEVED    LONG TERM GOALS: Target date: 01/07/2024  Patient will increase FOTO score to equal to or greater than 64 to demonstrate statistically significant improvement in mobility and quality of life.  Baseline: 1/14: 60 12/12/23: 57  01/02/24: 65  Goal status: ACHIEVED   2.  Patient (> 26 years old) will complete five times sit to stand test in < 13 seconds indicating an increased LE strength and improved balance. Baseline: 1/14: 16.28 seconds 12/17/23: 13.26 seconds Goal status: ACHIEVED   3.  Patient will increase six minute walk test distance by 200' for to improve activity tolerance and gait ability  Baseline: 1/14: 1,200 ft 12/12/23: 1471 Goal status: ACHIEVED   4.  Patient will increase BLE gross strength to 4+/5 as to improve functional strength for increased standing tolerance and increased ADL ability. Baseline: 1/14: see chart above  12/12/23: see chart above 12/30/23: See chart above  Goal status: ACHIEVED   5.  Patient will report a worst pain of 3/10 on NRPS in low back and LLE to improve tolerance with ADLs and reduced symptoms with activities. Baseline: 1/14: 10/10 pain 12/30/23: 3/10  Goal status: ACHIEVED    PLAN:  PT FREQUENCY: 1-2x/week  PT DURATION: 6 weeks  PLANNED INTERVENTIONS: 97164- PT Re-evaluation, 97110-Therapeutic exercises, 97530- Therapeutic activity, 97112- Neuromuscular re-education, 97535- Self Care, 54098- Manual therapy, 97116- Gait training, 97014- Electrical stimulation (unattended), [phone]- Electrical stimulation (manual), Patient/Family education, Dry Needling, Joint mobilization, Joint manipulation, Spinal manipulation, Spinal mobilization, DME instructions, Cryotherapy, and Moist heat.  PLAN FOR NEXT SESSION:  Discharge    Johnn Hai, PT, DPT  Physical Therapist - [facility]  [facility]  01/02/2024, 4:47 PM

## 2024-01-10 ENCOUNTER — Telehealth: Payer: Self-pay

## 2024-01-10 NOTE — Telephone Encounter (Signed)
-----   Message from [facility] sent at 01/07/2024  8:04 AM EST ----- Sounds good ----- Message ----- From: Sharlot Gowda, RN Sent: 01/06/2024  10:20 AM EST To: Venetia Night, MD  Based on this, it sounds like you do not need to see her back to discuss surgery. I can send her a mychart message to contact us if she needs [place]. ----- Message ----- From: Venetia Night, MD Sent: 01/03/2024  10:47 AM EST To: Sharlot Gowda, RN   ----- Message ----- From: Johnn Hai, PT Sent: 01/02/2024   4:52 PM EST To: Venetia Night, MD  Hello Dr. Myer Haff,  Pt has now met all of her rehab goals with an improvement in the perception of her low back function. She continues to experience decreased low back pain. PT educated pt on home exercise plan that includes extensor strengthening exercises for pt to continue to maintain strength gains. Please let me know if you have any additional questions or concerns.  Thank you,   Ellin Goodie PT DPT

## 2024-01-12 NOTE — Progress Notes (Unsigned)
 Cardiology Office Note  Date:  01/13/2024   ID:  Andrea Randolph, DOB [birth year], MRN [id number]  PCP:  Alba Cory, MD   Chief Complaint  Patient presents with   6 month follow up     "Doing well."     HPI:  [name] is a 68 year old woman with past medical history of Obstructive sleep apnea Hypothyroid Hypertension Hyperlipidemia  Diabetes  Chronic renal insufficiency Morbid obesity Mild LVOT gradient with a peak gradient of 30 mm Hg. Breast cancer, status postlumpectomy, radiation, Arimidex 2022 aortic valve disease,prior history of pulmonary hypertension, LVH on echo Who presents for follow-up of her hypertension, nonobstructive coronary disease  Last seen by myself in clinic August 2022 Last seen by one of our providers December 13, 2022  In follow-up today she reports doing well Retired, full-time taking care of her nieces, works at [employer] Denies chest pain or shortness of breath concerning for angina No regular exercise program  Tolerating Praluent  Prior records reviewed ER visit 04/02/22 for chest pain.  BP was high.  HS trop 47>48.  CT chest was negative for PE.    Cardiac studies were reviewed Cardiac CTA showed coronary calcium score 103, 83rd percentile for age and sex matched, mild LAD stenosis, minimal PDA stenosis, overall mild nonobstructive CAD 25-49%.   Echo showed LVEF 60-65%, no WMA, moderate LVH, LVOT gradient , G1DD, mild AI.   Lab work reviewed A1C 7.1 trending up from 6 range CR 1.2 Total chol 151 , LDL 85 on Praluent  EKG personally reviewed by myself on todays visit EKG Interpretation Date/Time:  Monday January 13 2024 11:30:47 EST Ventricular Rate:  79 PR Interval:  162 QRS Duration:  78 QT Interval:  366 QTC Calculation: 419 R Axis:   50  Text Interpretation: Normal sinus rhythm T wave abnormality, consider lateral ischemia When compared with ECG of 02-Apr-2022 14:42, T wave inversion no longer evident in Inferior  leads Confirmed by Julien Nordmann [phone]) on 01/13/2024 12:12:37 PM   Echo 2012 mild pulmonary hypertension Mild LVH and aortic stenosis Heart murmur  statin intolerances  Crestor: muscle pain Pravastatin elevated CK Lipitor: Does not remember the side effect   PMH:   has a past medical history of Allergy, Anemia, Arthritis of right shoulder region, Cancer (HCC), Chronic insomnia, Diabetes mellitus without complication (HCC), Family history of adverse reaction to anesthesia, GERD (gastroesophageal reflux disease), Goiter, Heart murmur, Hyperlipidemia, Hypertension, Hypertensive retinopathy of left eye, Hypothyroidism, adult, Inflammation of urethra, Menopause, Microalbuminuria, Mixed incontinence, Muscle cramps, Obesity, OSA (obstructive sleep apnea), Personal history of radiation therapy, Radiculitis, lumbosacral, and Vitamin D deficiency.  PSH:    Past Surgical History:  Procedure Laterality Date   BREAST BIOPSY Left 90's   benign core needle    BREAST BIOPSY Right 02/07/2021   [place] Bx, Q-clip, DCIS   BREAST LUMPECTOMY Right 03/06/2021   BREAST LUMPECTOMY WITH SENTINEL LYMPH NODE BIOPSY Right 03/06/2021   Procedure: BREAST LUMPECTOMY WITH SENTINEL LYMPH NODE BX;  Surgeon: Earline Mayotte, MD;  Location: [facility] ORS;  Service: General;  Laterality: Right;   COLONOSCOPY WITH PROPOFOL N/A 01/26/2019   Procedure: COLONOSCOPY WITH PROPOFOL;  Surgeon: Toney Reil, MD;  Location: [facility] ENDOSCOPY;  Service: Gastroenterology;  Laterality: N/A;   COLONOSCOPY WITH PROPOFOL N/A 02/09/2022   Procedure: COLONOSCOPY WITH PROPOFOL;  Surgeon: Wyline Mood, MD;  Location: [facility] ENDOSCOPY;  Service: Gastroenterology;  Laterality: N/A;   FOOT SURGERY Bilateral    heel spurs   JOINT  REPLACEMENT     TOTAL SHOULDER REPLACEMENT Right 01/25/2015   Dr. Joice Lofts   TOTAL THYROIDECTOMY  08/01/2018   TRIGGER FINGER RELEASE Right 06/10/2023   ring finger - Dr. Rosita Kea    Current Outpatient Medications   Medication Sig Dispense Refill   acetaminophen (TYLENOL) 500 MG tablet Take 500 mg by mouth every 6 (six) hours as needed.     Alirocumab (PRALUENT) 150 MG/ML SOAJ Inject 1 mL (150 mg total) into the skin every 14 (fourteen) days. 6 mL 1   aspirin 81 MG tablet Take 81 mg by mouth daily.     Calcium Carb-Cholecalciferol (CALCIUM 500+D3 PO) Take 1 tablet by mouth daily.     Cholecalciferol (VITAMIN D3) 1000 units CAPS Take 1,000 Units by mouth daily.     cyanocobalamin (VITAMIN B12) 1000 MCG tablet Take 1,000 mcg by mouth daily.     dapagliflozin propanediol (FARXIGA) 10 MG TABS tablet Take 1 tablet (10 mg total) by mouth daily. Through [place] Patient Assistance 90 tablet 3   exemestane (AROMASIN) 25 MG tablet Take 1 tablet (25 mg total) by mouth daily. 90 tablet 1   ezetimibe (ZETIA) 10 MG tablet Take 1 tablet (10 mg total) by mouth daily. 90 tablet 1   levothyroxine (SYNTHROID) 125 MCG tablet TAKE 1 TABLET BY MOUTH DAILY 30 tablet 0   Olmesartan-amLODIPine-HCTZ 40-5-25 MG TABS Take 1 tablet by mouth daily at 12 noon. 90 tablet 1   Semaglutide (RYBELSUS) 14 MG TABS Take 1 tablet (14 mg total) by mouth daily at 6 (six) AM. Through [employer] Patient Assistance 90 tablet 0   clindamycin (CLEOCIN T) 1 % external solution Apply 1 Application topically every morning. (Patient not taking: Reported on 01/13/2024)     diclofenac Sodium (VOLTAREN) 1 % GEL Apply 2 g topically 4 (four) times daily as needed. (Patient not taking: Reported on 01/13/2024) 100 g 2   glucose blood test strip  (Patient not taking: Reported on 01/13/2024)     tretinoin (RETIN-A) 0.025 % cream Apply 1 Application topically daily. (Patient not taking: Reported on 01/13/2024)     No current facility-administered medications for this visit.     Allergies:   Pantoprazole and Rosuvastatin   Social History:  The patient  reports that she has never smoked. She has never used smokeless tobacco. She reports that she does not drink alcohol and  does not use drugs.   Family History:   family history includes Breast cancer (age of onset: 33) in her maternal aunt; CVA in her brother; Diabetes in her sister; Heart disease in her father and mother; Hypertension in her brother, sister, and sister; Kidney disease in her sister; Kidney failure in her sister.    Review of Systems: Review of Systems  Constitutional: Negative.   Respiratory: Negative.    Cardiovascular: Negative.   Gastrointestinal: Negative.   Musculoskeletal:  Positive for joint pain.  Neurological: Negative.   Psychiatric/Behavioral: Negative.    All other systems reviewed and are negative.  PHYSICAL EXAM: VS:  BP 122/60 (BP Location: Left Arm, Patient Position: Sitting, Cuff Size: Large)   Pulse 79   Ht 5\' 2"  (1.575 m)   Wt 211 lb 6 oz (95.9 kg)   SpO2 97%   BMI 38.66 kg/m  , BMI Body mass index is 38.66 kg/m. Constitutional:  oriented to person, place, and time. No distress.  HENT:  Head: Grossly normal Eyes:  no discharge. No scleral icterus.  Neck: No JVD, no carotid bruits  Cardiovascular: Regular rate and rhythm, no murmurs appreciated Pulmonary/Chest: Clear to auscultation bilaterally, no wheezes or rails Abdominal: Soft.  no distension.  no tenderness.  Musculoskeletal: Normal range of motion Neurological:  normal muscle tone. Coordination normal. No atrophy Skin: Skin warm and dry Psychiatric: normal affect, pleasant  Recent Labs: 09/10/2023: ALT 19; BUN 15; Creatinine 1.21; Hemoglobin 14.1; Platelet Count 382; Potassium 3.4; Sodium 138    Lipid Panel Lab Results  Component Value Date   CHOL 156 07/11/2023   HDL 55 07/11/2023   LDLCALC 85 07/11/2023   TRIG 72 07/11/2023      Wt Readings from Last 3 Encounters:  01/13/24 211 lb 6 oz (95.9 kg)  11/15/23 211 lb 3.2 oz (95.8 kg)  09/24/23 211 lb 9.6 oz (96 kg)     ASSESSMENT AND PLAN:   Outflow tract gradient - Mild LVOT gradient with a peak gradient of 30 mm Hg. On echo  2019 Similar findings on echo November 2023 No significant aortic valve stenoisis Asymptomatic  Mild aortic regurgitation - Plan: EKG 12-Lead No significant murmur  Type 2 diabetes mellitus with diabetic nephropathy, without long-term current use of insulin (HCC) A1c trending higher Low carbohydrate diet recommended, walking program  Benign essential HTN Blood pressure is well controlled on today's visit. No changes made to the medications.  Mild concentric left ventricular hypertrophy (LVH) Mild LV outflow tract gradient noted, asymptomatic  Dyslipidemia Continue Zetia daily with Praluent Statin intolerance    Orders Placed This Encounter  Procedures   EKG 12-Lead      Signed, Dossie Arbour, M.D., Ph.D. 01/13/2024  [facility] [place], [place] [phone]

## 2024-01-13 ENCOUNTER — Ambulatory Visit: Payer: Medicare HMO | Attending: Cardiovascular Disease | Admitting: Cardiovascular Disease

## 2024-01-13 ENCOUNTER — Encounter: Payer: Self-pay | Admitting: Cardiovascular Disease

## 2024-01-13 VITALS — BP 122/60 | HR 79 | Ht 62.0 in | Wt 211.4 lb

## 2024-01-13 DIAGNOSIS — I517 Cardiomegaly: Secondary | ICD-10-CM

## 2024-01-13 DIAGNOSIS — E1129 Type 2 diabetes mellitus with other diabetic kidney complication: Secondary | ICD-10-CM

## 2024-01-13 DIAGNOSIS — I350 Nonrheumatic aortic (valve) stenosis: Secondary | ICD-10-CM | POA: Diagnosis not present

## 2024-01-13 DIAGNOSIS — G4733 Obstructive sleep apnea (adult) (pediatric): Secondary | ICD-10-CM | POA: Diagnosis not present

## 2024-01-13 DIAGNOSIS — I2510 Atherosclerotic heart disease of native coronary artery without angina pectoris: Secondary | ICD-10-CM

## 2024-01-13 DIAGNOSIS — E782 Mixed hyperlipidemia: Secondary | ICD-10-CM | POA: Diagnosis not present

## 2024-01-13 DIAGNOSIS — E119 Type 2 diabetes mellitus without complications: Secondary | ICD-10-CM

## 2024-01-13 DIAGNOSIS — I10 Essential (primary) hypertension: Secondary | ICD-10-CM | POA: Diagnosis not present

## 2024-01-13 DIAGNOSIS — I351 Nonrheumatic aortic (valve) insufficiency: Secondary | ICD-10-CM

## 2024-01-13 DIAGNOSIS — N1831 Chronic kidney disease, stage 3a: Secondary | ICD-10-CM

## 2024-01-13 DIAGNOSIS — R072 Precordial pain: Secondary | ICD-10-CM | POA: Diagnosis not present

## 2024-01-13 DIAGNOSIS — R809 Proteinuria, unspecified: Secondary | ICD-10-CM | POA: Diagnosis not present

## 2024-01-13 NOTE — Patient Instructions (Signed)

## 2024-01-15 ENCOUNTER — Encounter: Payer: Self-pay | Admitting: Family Medicine

## 2024-01-15 ENCOUNTER — Ambulatory Visit: Payer: Medicare HMO | Admitting: Family Medicine

## 2024-01-15 VITALS — BP 126/74 | HR 99 | Resp 16 | Ht 62.0 in | Wt 211.8 lb

## 2024-01-15 DIAGNOSIS — Z0000 Encounter for general adult medical examination without abnormal findings: Secondary | ICD-10-CM | POA: Diagnosis not present

## 2024-01-15 NOTE — Patient Instructions (Signed)
 Preventive Care 43 Years and Older, Female Preventive care refers to lifestyle choices and visits with your health care provider that can promote health and wellness. Preventive care visits are also called wellness exams. What can I expect for my preventive care visit? Counseling Your health care provider may ask you questions about your: Medical history, including: Past medical problems. Family medical history. Pregnancy and menstrual history. History of falls. Current health, including: Memory and ability to understand (cognition). Emotional well-being. Home life and relationship well-being. Sexual activity and sexual health. Lifestyle, including: Alcohol, nicotine or tobacco, and drug use. Access to firearms. Diet, exercise, and sleep habits. Work and work Astronomer. Sunscreen use. Safety issues such as seatbelt and bike helmet use. Physical exam Your health care provider will check your: Height and weight. These may be used to calculate your BMI (body mass index). BMI is a measurement that tells if you are at a healthy weight. Waist circumference. This measures the distance around your waistline. This measurement also tells if you are at a healthy weight and may help predict your risk of certain diseases, such as type 2 diabetes and high blood pressure. Heart rate and blood pressure. Body temperature. Skin for abnormal spots. What immunizations do I need?  Vaccines are usually given at various ages, according to a schedule. Your health care provider will recommend vaccines for you based on your age, medical history, and lifestyle or other factors, such as travel or where you work. What tests do I need? Screening Your health care provider may recommend screening tests for certain conditions. This may include: Lipid and cholesterol levels. Hepatitis C test. Hepatitis B test. HIV (human immunodeficiency virus) test. STI (sexually transmitted infection) testing, if you are at  risk. Lung cancer screening. Colorectal cancer screening. Diabetes screening. This is done by checking your blood sugar (glucose) after you have not eaten for a while (fasting). Mammogram. Talk with your health care provider about how often you should have regular mammograms. BRCA-related cancer screening. This may be done if you have a family history of breast, ovarian, tubal, or peritoneal cancers. Bone density scan. This is done to screen for osteoporosis. Talk with your health care provider about your test results, treatment options, and if necessary, the need for more tests. Follow these instructions at home: Eating and drinking  Eat a diet that includes fresh fruits and vegetables, whole grains, lean protein, and low-fat dairy products. Limit your intake of foods with high amounts of sugar, saturated fats, and salt. Take vitamin and mineral supplements as recommended by your health care provider. Do not drink alcohol if your health care provider tells you not to drink. If you drink alcohol: Limit how much you have to 0-1 drink a day. Know how much alcohol is in your drink. In the [place] one drink equals one 12 oz bottle of beer (355 mL), one 5 oz glass of wine (148 mL), or one 1 oz glass of hard liquor (44 mL). Lifestyle Brush your teeth every morning and night with fluoride toothpaste. Floss one time each day. Exercise for at least 30 minutes 5 or more days each week. Do not use any products that contain nicotine or tobacco. These products include cigarettes, chewing tobacco, and vaping devices, such as e-cigarettes. If you need help quitting, ask your health care provider. Do not use drugs. If you are sexually active, practice safe sex. Use a condom or other form of protection in order to prevent STIs. Take aspirin only as told by  your health care provider. Make sure that you understand how much to take and what form to take. Work with your health care provider to find out whether it  is safe and beneficial for you to take aspirin daily. Ask your health care provider if you need to take a cholesterol-lowering medicine (statin). Find healthy ways to manage stress, such as: Meditation, yoga, or listening to music. Journaling. Talking to a trusted person. Spending time with friends and family. Minimize exposure to UV radiation to reduce your risk of skin cancer. Safety Always wear your seat belt while driving or riding in a vehicle. Do not drive: If you have been drinking alcohol. Do not ride with someone who has been drinking. When you are tired or distracted. While texting. If you have been using any mind-altering substances or drugs. Wear a helmet and other protective equipment during sports activities. If you have firearms in your house, make sure you follow all gun safety procedures. What's next? Visit your health care provider once a year for an annual wellness visit. Ask your health care provider how often you should have your eyes and teeth checked. Stay up to date on all vaccines. This information is not intended to replace advice given to you by your health care provider. Make sure you discuss any questions you have with your health care provider. Document Revised: 04/26/2021 Document Reviewed: 04/26/2021 Elsevier Patient Education  2024 [employer].

## 2024-01-15 NOTE — Progress Notes (Signed)
 Name: Andrea Randolph   MRN: [id number]    DOB: [birth year]   Date:01/15/2024       Progress Note  Subjective  Chief Complaint  Chief Complaint  Patient presents with   Annual Exam    HPI  Patient presents for annual CPE.  Diet: she eats mostly at home Exercise:  she recently finished PT and is going back to the gym to continue her PT for her back  Last Eye Exam: completed Last Dental Exam: completed  Flowsheet Row Office Visit from 01/15/2024 in [facility]  AUDIT-C Score 0      Depression: Phq 9 is  negative    01/15/2024    8:05 AM 11/15/2023    3:18 PM 07/29/2023    8:48 AM 07/11/2023   10:57 AM 03/27/2023   10:17 AM  Depression screen PHQ 2/9  Decreased Interest 0 0 1 0 0  Down, Depressed, Hopeless 0 0 1 0 0  PHQ - 2 Score 0 0 2 0 0  Altered sleeping 0 0 1  0  Tired, decreased energy 0 0 1  0  Change in appetite 0 0 1  0  Feeling bad or failure about yourself  0 0 0  0  Trouble concentrating 0 0 0  0  Moving slowly or fidgety/restless 0 0 1  0  Suicidal thoughts 0 0 0  0  PHQ-9 Score 0 0 6  0  Difficult doing work/chores Not difficult at all Not difficult at all Somewhat difficult     Hypertension: BP Readings from Last 3 Encounters:  01/15/24 126/74  01/13/24 122/60  11/15/23 118/68   Obesity: Wt Readings from Last 3 Encounters:  01/15/24 211 lb 12.8 oz (96.1 kg)  01/13/24 211 lb 6 oz (95.9 kg)  11/15/23 211 lb 3.2 oz (95.8 kg)   BMI Readings from Last 3 Encounters:  01/15/24 38.74 kg/m  01/13/24 38.66 kg/m  11/15/23 36.25 kg/m     Vaccines: reviewed with the patient.   Hep C Screening: completed STD testing and prevention (HIV/chl/gon/syphilis): N/A Intimate partner violence: negative screen  Sexual History :not sexually active  Menstrual History/LMP/Abnormal Bleeding: post menopausal  Discussed importance of follow up if any post-menopausal bleeding: yes  Incontinence Symptoms: positive for symptoms - she has urgency for  over one year - discussed medication but not interested   Breast cancer:  - Last Mammogram: scheduled  - BRCA gene screening: N/A  Osteoporosis Prevention : Discussed high calcium and vitamin D supplementation, weight bearing exercises Bone density :yes   Cervical cancer screening: not applicable due to age  Skin cancer: Discussed monitoring for atypical lesions  Colorectal cancer: up to date    Lung cancer:  Low Dose CT Chest recommended if Age 31-80 years, 20 pack-year currently smoking OR have quit w/in 15years. Patient does not qualify for screen   ECG: sees cardiologist   Advanced Care Planning: A voluntary discussion about advance care planning including the explanation and discussion of advance directives.  Discussed health care proxy and Living will, and the patient was able to identify a health care proxy as daughter .  Patient does not have a living will and power of attorney of health care   Patient Active Problem List   Diagnosis Date Noted   Diabetes mellitus with microalbuminuria (HCC) 11/15/2023   Chronic bilateral low back pain with left-sided sciatica 07/11/2023   Genetic testing 03/28/2022   Stage 3a chronic kidney disease (CKD) (HCC) 03/07/2022  Aromatase inhibitor use 05/30/2021   Malignant 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, estrogen receptor positive (HCC) 02/20/2021   Carpal tunnel syndrome, bilateral 02/19/2019   Trigger finger of right thumb 02/19/2019   Flexor tenosynovitis of finger 02/19/2019   Mild concentric left ventricular hypertrophy (LVH) 12/24/2017   Mild aortic stenosis by prior echocardiogram 12/24/2017   Mild aortic regurgitation 12/24/2017   Uterine fibroid 01/11/2016   Allergic rhinitis, seasonal 05/24/2015   Benign essential HTN 05/24/2015   Chronic constipation 05/24/2015   Dyslipidemia 05/24/2015   Post-surgical hypothyroidism 05/24/2015   Mixed incontinence 05/24/2015   Microalbuminuria 05/24/2015   Menopause  05/24/2015   Obstructive apnea 05/24/2015   Morbid obesity (HCC) 05/24/2015   Hypertensive retinopathy 05/24/2015   Vitamin D deficiency 05/24/2015   Status post total replacement of right shoulder 03/11/2015    Past Surgical History:  Procedure Laterality Date   BREAST BIOPSY Left 90's   benign core needle    BREAST BIOPSY Right 02/07/2021   [place] Bx, Q-clip, DCIS   BREAST LUMPECTOMY Right 03/06/2021   BREAST LUMPECTOMY WITH SENTINEL LYMPH NODE BIOPSY Right 03/06/2021   Procedure: BREAST LUMPECTOMY WITH SENTINEL LYMPH NODE BX;  Surgeon: Earline Mayotte, MD;  Location: [facility] ORS;  Service: General;  Laterality: Right;   COLONOSCOPY WITH PROPOFOL N/A 01/26/2019   Procedure: COLONOSCOPY WITH PROPOFOL;  Surgeon: Toney Reil, MD;  Location: [facility] ENDOSCOPY;  Service: Gastroenterology;  Laterality: N/A;   COLONOSCOPY WITH PROPOFOL N/A 02/09/2022   Procedure: COLONOSCOPY WITH PROPOFOL;  Surgeon: Wyline Mood, MD;  Location: [facility];  Service: Gastroenterology;  Laterality: N/A;   FOOT SURGERY Bilateral    heel spurs   JOINT REPLACEMENT     TOTAL SHOULDER REPLACEMENT Right 01/25/2015   Dr. Joice Lofts   TOTAL THYROIDECTOMY  08/01/2018   TRIGGER FINGER RELEASE Right 06/10/2023   ring finger - Dr. Rosita Kea    Family History  Problem Relation Age of Onset   Heart disease Mother    Heart disease Father    Kidney disease Sister    Hypertension Sister    Diabetes Sister    Hypertension Sister    Kidney failure Sister    Hypertension Brother    CVA Brother    Breast cancer Maternal Aunt 50   Ovarian cancer Neg Hx    Colon cancer Neg Hx     Social History   Socioeconomic History   Marital status: Legally Separated    Spouse name: Not on file   Number of children: 1   Years of education: Not on file   Highest education level: 12th grade  Occupational History   Occupation: Biomedical scientist  Tobacco Use   Smoking status: Never   Smokeless tobacco: Never  Vaping Use    Vaping status: Never Used  Substance and Sexual Activity   Alcohol use: No    Alcohol/week: 0.0 standard drinks of alcohol   Drug use: No   Sexual activity: Not Currently    Birth control/protection: Post-menopausal  Other Topics Concern   Not on file  Social History Narrative   Separated in around 2009.   She was living with her daughter, however currently moved in with her sister and nephew , but is looking for a house.    Social Drivers of Corporate investment banker Strain: Low Risk  (01/15/2024)   Overall Financial Resource Strain (CARDIA)    Difficulty of Paying Living Expenses: Not hard at all  Food Insecurity: No Food  Insecurity (01/15/2024)   Hunger Vital Sign    Worried About Running Out of Food in the Last Year: Never true    Ran Out of Food in the Last Year: Never true  Transportation Needs: No Transportation Needs (01/15/2024)   PRAPARE - Administrator, Civil Service (Medical): No    Lack of Transportation (Non-Medical): No  Physical Activity: Insufficiently Active (01/15/2024)   Exercise Vital Sign    Days of Exercise per Week: 3 days    Minutes of Exercise per Session: 20 min  Stress: No Stress Concern Present (01/15/2024)   [employer] of Occupational Health - Occupational Stress Questionnaire    Feeling of Stress : Only a little  Social Connections: Moderately Integrated (01/15/2024)   Social Connection and Isolation Panel [NHANES]    Frequency of Communication with Friends and Family: More than three times a week    Frequency of Social Gatherings with Friends and Family: More than three times a week    Attends Religious Services: More than 4 times per year    Active Member of [employer] or Organizations: Yes    Attends Banker Meetings: More than 4 times per year    Marital Status: Separated  Intimate Partner Violence: Not At Risk (01/15/2024)   Humiliation, Afraid, Rape, and Kick questionnaire    Fear of Current or Ex-Partner: No     Emotionally Abused: No    Physically Abused: No    Sexually Abused: No     Current Outpatient Medications:    acetaminophen (TYLENOL) 500 MG tablet, Take 500 mg by mouth every 6 (six) hours as needed., Disp: , Rfl:    Alirocumab (PRALUENT) 150 MG/ML SOAJ, Inject 1 mL (150 mg total) into the skin every 14 (fourteen) days., Disp: 6 mL, Rfl: 1   aspirin 81 MG tablet, Take 81 mg by mouth daily., Disp: , Rfl:    Calcium Carb-Cholecalciferol (CALCIUM 500+D3 PO), Take 1 tablet by mouth daily., Disp: , Rfl:    Cholecalciferol (VITAMIN D3) 1000 units CAPS, Take 1,000 Units by mouth daily., Disp: , Rfl:    cyanocobalamin (VITAMIN B12) 1000 MCG tablet, Take 1,000 mcg by mouth daily., Disp: , Rfl:    dapagliflozin propanediol (FARXIGA) 10 MG TABS tablet, Take 1 tablet (10 mg total) by mouth daily. Through [place] Patient Assistance, Disp: 90 tablet, Rfl: 3   exemestane (AROMASIN) 25 MG tablet, Take 1 tablet (25 mg total) by mouth daily., Disp: 90 tablet, Rfl: 1   ezetimibe (ZETIA) 10 MG tablet, Take 1 tablet (10 mg total) by mouth daily., Disp: 90 tablet, Rfl: 1   levothyroxine (SYNTHROID) 125 MCG tablet, TAKE 1 TABLET BY MOUTH DAILY, Disp: 30 tablet, Rfl: 0   Olmesartan-amLODIPine-HCTZ 40-5-25 MG TABS, Take 1 tablet by mouth daily at 12 noon., Disp: 90 tablet, Rfl: 1   Semaglutide (RYBELSUS) 14 MG TABS, Take 1 tablet (14 mg total) by mouth daily at 6 (six) AM. Through [employer] Patient Assistance, Disp: 90 tablet, Rfl: 0   clindamycin (CLEOCIN T) 1 % external solution, Apply 1 Application topically every morning. (Patient not taking: Reported on 01/15/2024), Disp: , Rfl:    diclofenac Sodium (VOLTAREN) 1 % GEL, Apply 2 g topically 4 (four) times daily as needed. (Patient not taking: Reported on 01/13/2024), Disp: 100 g, Rfl: 2   glucose blood test strip, , Disp: , Rfl:    tretinoin (RETIN-A) 0.025 % cream, Apply 1 Application topically daily. (Patient not taking: Reported on 01/15/2024), Disp: ,  Rfl:    Allergies  Allergen Reactions   Pantoprazole Other (See Comments)    Affects thyroid    Rosuvastatin Other (See Comments)    Myalgia      ROS  Constitutional: Negative for fever or weight change.  Respiratory: Negative for cough and shortness of breath.   Cardiovascular: Negative for chest pain or palpitations.  Gastrointestinal: Negative for abdominal pain, no bowel changes.  Musculoskeletal: Negative for gait problem or joint swelling.  Skin: Negative for rash.  Neurological: Negative for dizziness or headache.  No other specific complaints in a complete review of systems (except as listed in HPI above).   Objective  Vitals:   01/15/24 0808  BP: 126/74  Pulse: 99  Resp: 16  SpO2: 100%  Weight: 211 lb 12.8 oz (96.1 kg)  Height: 5\' 2"  (1.575 m)    Body mass index is 38.74 kg/m.  Physical Exam  Constitutional: Patient appears well-developed and well-nourished. No distress.  HENT: Head: Normocephalic and atraumatic. Ears: B TMs ok, no erythema or effusion; Nose: Nose normal. Mouth/Throat: Oropharynx is clear and moist. No oropharyngeal exudate.  Eyes: Conjunctivae and EOM are normal. Pupils are equal, round, and reactive to light. No scleral icterus.  Neck: Normal range of motion. Neck supple. No JVD present. No thyromegaly present.  Cardiovascular: Normal rate, regular rhythm and normal heart sounds.  No murmur heard. No BLE edema. Pulmonary/Chest: Effort normal and breath sounds normal. No respiratory distress. Abdominal: Soft. Bowel sounds are normal, no distension. There is no tenderness. no masses Breast: no lumps or masses, no nipple discharge or rashes FEMALE GENITALIA:  Not done  RECTAL: not done  Musculoskeletal: Normal range of motion, no joint effusions. No gross deformities Neurological: he is alert and oriented to person, place, and time. No cranial nerve deficit. Coordination, balance, strength, speech and gait are normal.  Skin: Skin is warm and dry.  No rash noted. No erythema.  Psychiatric: Patient has a normal mood and affect. behavior is normal. Judgment and thought content normal.     Diabetic Foot Exam - Simple   Simple Foot Form Visual Inspection See comments: Yes Sensation Testing Intact to touch and monofilament testing bilaterally: Yes Pulse Check Posterior Tibialis and Dorsalis pulse intact bilaterally: Yes Comments Thick first toe nail of right foot       Assessment & Plan  1. Well adult exam (Primary)  We will check labs during her next visit  Does not want treatment for urinary urgency     -USPSTF grade A and B recommendations reviewed with patient; age-appropriate recommendations, preventive care, screening tests, etc discussed and encouraged; healthy living encouraged; see AVS for patient education given to patient -Discussed importance of 150 minutes of physical activity weekly, eat two servings of fish weekly, eat one serving of tree nuts ( cashews, pistachios, pecans, almonds.Marland Kitchen) every other day, eat 6 servings of fruit/vegetables daily and drink plenty of water and avoid sweet beverages.   -Reviewed Health Maintenance: Yes.

## 2024-01-19 DIAGNOSIS — E1151 Type 2 diabetes mellitus with diabetic peripheral angiopathy without gangrene: Secondary | ICD-10-CM | POA: Diagnosis not present

## 2024-01-19 DIAGNOSIS — E1136 Type 2 diabetes mellitus with diabetic cataract: Secondary | ICD-10-CM | POA: Diagnosis not present

## 2024-01-19 DIAGNOSIS — I130 Hypertensive heart and chronic kidney disease with heart failure and stage 1 through stage 4 chronic kidney disease, or unspecified chronic kidney disease: Secondary | ICD-10-CM | POA: Diagnosis not present

## 2024-01-19 DIAGNOSIS — Z008 Encounter for other general examination: Secondary | ICD-10-CM | POA: Diagnosis not present

## 2024-01-19 DIAGNOSIS — I272 Pulmonary hypertension, unspecified: Secondary | ICD-10-CM | POA: Diagnosis not present

## 2024-01-19 DIAGNOSIS — E1122 Type 2 diabetes mellitus with diabetic chronic kidney disease: Secondary | ICD-10-CM | POA: Diagnosis not present

## 2024-01-19 DIAGNOSIS — E785 Hyperlipidemia, unspecified: Secondary | ICD-10-CM | POA: Diagnosis not present

## 2024-01-19 DIAGNOSIS — R32 Unspecified urinary incontinence: Secondary | ICD-10-CM | POA: Diagnosis not present

## 2024-01-19 DIAGNOSIS — E1159 Type 2 diabetes mellitus with other circulatory complications: Secondary | ICD-10-CM | POA: Diagnosis not present

## 2024-01-19 DIAGNOSIS — Z8249 Family history of ischemic heart disease and other diseases of the circulatory system: Secondary | ICD-10-CM | POA: Diagnosis not present

## 2024-01-19 DIAGNOSIS — N1831 Chronic kidney disease, stage 3a: Secondary | ICD-10-CM | POA: Diagnosis not present

## 2024-01-19 DIAGNOSIS — I509 Heart failure, unspecified: Secondary | ICD-10-CM | POA: Diagnosis not present

## 2024-01-19 LAB — HM DIABETES EYE EXAM

## 2024-01-19 LAB — LAB REPORT - SCANNED
Alb/Creat Ratio, Ur: 30 mg/g{creat}
EGFR (African American): 60

## 2024-01-19 LAB — MICROALBUMIN / CREATININE URINE RATIO: Microalb Creat Ratio: NORMAL

## 2024-01-20 ENCOUNTER — Ambulatory Visit
Admission: RE | Admit: 2024-01-20 | Discharge: 2024-01-20 | Disposition: A | Discharge status: Home / Self Care | Payer: Medicare HMO | Source: Ambulatory Visit | Attending: Oncology | Admitting: Oncology

## 2024-01-20 DIAGNOSIS — Z170 Estrogen receptor positive status [ER+]: Secondary | ICD-10-CM | POA: Insufficient documentation

## 2024-01-20 DIAGNOSIS — C50211 Malignant neoplasm of upper-inner quadrant of right female breast: Secondary | ICD-10-CM | POA: Insufficient documentation

## 2024-01-20 DIAGNOSIS — R92323 Mammographic fibroglandular density, bilateral breasts: Secondary | ICD-10-CM | POA: Diagnosis not present

## 2024-01-29 ENCOUNTER — Other Ambulatory Visit: Payer: Medicare HMO | Admitting: Pharmacist

## 2024-01-31 ENCOUNTER — Telehealth: Payer: Self-pay | Admitting: Family Medicine

## 2024-01-31 NOTE — Telephone Encounter (Signed)
 Requesting a return call. She would like to change the time of her medicare wellness appt.

## 2024-02-03 LAB — FECAL OCCULT BLOOD, IMMUNOCHEMICAL: IFOBT: NEGATIVE

## 2024-02-04 NOTE — Telephone Encounter (Signed)
 I spoke with patient and she rescheduled her AWV to 02/21/2024 @ 8:50.

## 2024-02-05 ENCOUNTER — Other Ambulatory Visit: Payer: Self-pay | Admitting: Family Medicine

## 2024-02-05 ENCOUNTER — Other Ambulatory Visit: Payer: Self-pay | Admitting: Pharmacist

## 2024-02-05 DIAGNOSIS — E785 Hyperlipidemia, unspecified: Secondary | ICD-10-CM

## 2024-02-05 DIAGNOSIS — N1832 Chronic kidney disease, stage 3b: Secondary | ICD-10-CM

## 2024-02-05 DIAGNOSIS — I10 Essential (primary) hypertension: Secondary | ICD-10-CM

## 2024-02-05 NOTE — Progress Notes (Unsigned)
 02/05/2024 Name: Andrea Randolph MRN: [id number] DOB: December 19, 1955  Chief Complaint  Patient presents with   Medication Management   Medication Assistance    Andrea Randolph is a 68 y.o. year old female who presented for a telephone visit.   They were referred to the pharmacist by their PCP for assistance in managing medication access.      Subjective:   Care Team: Primary Care Provider: Alba Cory, MD ; Next Scheduled Visit: 03/18/2024 Oncology: Rickard Patience, MD; Next Scheduled Visit: 03/09/2024 Rheumatology: Tracey Harries, MD  Neurosurgery: Venetia Night, MD Cardiologist: Julien Nordmann, MD  Medication Access/Adherence  [phone] [place], [name] - [address] [place] [address] Phone: [phone] Fax: [phone]  [facility] - [place], [place] - [address] [place] [address] Phone: [phone] Fax: [phone]   Patient reports affordability concerns with their medications: No  Patient reports access/transportation concerns to their pharmacy: No  Patient reports adherence concerns with their medications:  No       Diabetes:   Current medications:  - Farxiga 10 mg daily - Rybelsus 14 mg daily Confirms takes on an empty stomach, >=30 minutes before the first food, beverage, or other oral medications of the day with <=4 oz of plain water only   Denies checking home blood sugar recently   Patient denies hypoglycemic s/sx including dizziness, shakiness, sweating.    Current medication access support:  - Enrolled in patient assistance for Farxiga from [employer] through 11/11/2024 - Enrolled in patient assistance for Rybelsus from [employer] through 11/11/2024             Today reports received 4 month supply from program this month     Hypertension:   Current medications: olmesartan-amlodipine-HCTZ 40-5-25 mg daily   Patient has an automated, upper arm home BP cuff, but denies checking  recently   Patient denies hypotensive s/sx including dizziness, lightheadedness.      Hyperlipidemia/ASCVD Risk Reduction   Current lipid lowering medications:  - Praluent 150 mg injection every 14 days - ezetimibe 10 mg daily     Antiplatelet regimen: aspirin 81 mg   ASCVD History: Per review of Cardiology notes, Cardiac CTA in 2023 showed mild nonobstructive CAD    Current medication access support: Enrolled in [facility] for [employer]. From review of chart, apprears enrollment ends 02/29/2024  Today patient shares that she [facility] advising that they are unable to refill her Praluent; action needed from provider   Objective:  Lab Results  Component Value Date   HGBA1C 7.1 (A) 11/15/2023    Lab Results  Component Value Date   CREATININE 1.21 (H) 09/10/2023   BUN 15 09/10/2023   NA 138 09/10/2023   K 3.4 (L) 09/10/2023   CL 103 09/10/2023   CO2 27 09/10/2023    Lab Results  Component Value Date   CHOL 156 07/11/2023   HDL 55 07/11/2023   LDLCALC 85 07/11/2023   TRIG 72 07/11/2023   CHOLHDL 2.8 07/11/2023   BP Readings from Last 3 Encounters:  01/15/24 126/74  01/13/24 122/60  11/15/23 118/68   Pulse Readings from Last 3 Encounters:  01/15/24 99  01/13/24 79  11/15/23 90     Medications Reviewed Today     Reviewed by Manuela Neptune, RPH-CPP (Pharmacist) on 02/05/24 at 1008  Med List Status: <None>  Medication Order Taking? Sig Documenting Provider Last Dose Status Informant  acetaminophen (TYLENOL) 500 MG tablet [id number]  Take 500 mg by mouth every 6 (six) hours as needed. [provider]  Active   Alirocumab (PRALUENT) 150 MG/ML SOAJ [id number] Yes Inject 1 mL (150 mg total) into the skin every 14 (fourteen) days. Alba Cory, MD Taking Active   aspirin 81 MG tablet [id number]  Take 81 mg by mouth daily. [provider]  Active Self, Pharmacy Records, Multiple Informants, Other            Med Note Doyne Keel Jun 27, 2020  3:41 PM)    Calcium Carb-Cholecalciferol (CALCIUM 500+D3 PO) [id number]  Take 1 tablet by mouth daily. [provider]  Active Pharmacy Records, Multiple Informants, Self, Other  Cholecalciferol (VITAMIN D3) 1000 units CAPS [id number]  Take 1,000 Units by mouth daily. [provider]  Active Self, Pharmacy Records, Multiple Informants, Other  clindamycin (CLEOCIN T) 1 % external solution [id number]  Apply 1 Application topically every morning.  Patient not taking: Reported on 01/15/2024   [provider]  Active   cyanocobalamin (VITAMIN B12) 1000 MCG tablet [id number]  Take 1,000 mcg by mouth daily. [provider]  Active   dapagliflozin propanediol (FARXIGA) 10 MG TABS tablet [id number] Yes Take 1 tablet (10 mg total) by mouth daily. Through [employer] Patient Assistance Alba Cory, MD Taking Active   diclofenac Sodium (VOLTAREN) 1 % GEL [id number]  Apply 2 g topically 4 (four) times daily as needed.  Patient not taking: Reported on 01/13/2024   Alba Cory, MD  Active Pharmacy Records, Multiple Informants, Self, Other  exemestane (AROMASIN) 25 MG tablet [id number]  Take 1 tablet (25 mg total) by mouth daily. Rickard Patience, MD  Active   ezetimibe (ZETIA) 10 MG tablet [id number]  Take 1 tablet (10 mg total) by mouth daily. Alba Cory, MD  Active   glucose blood test strip [id number]    Patient not taking: Reported on 01/13/2024   [provider]  Active Self, Pharmacy Records, Multiple Informants, Other           Med Note Doyne Keel Jun 27, 2020  3:42 PM)    levothyroxine (SYNTHROID) 125 MCG tablet [id number]  TAKE 1 TABLET BY MOUTH DAILY Alba Cory, MD  Active   Olmesartan-amLODIPine-HCTZ 40-5-25 MG TABS [id number] Yes Take 1 tablet by mouth daily at 12 noon. Alba Cory, MD Taking Active   Semaglutide [facility]) 14 MG TABS [id number] Yes Take 1 tablet (14 mg total) by mouth daily at 6  (six) AM. Through [employer] Patient Assistance [place], Danna Hefty, MD Taking Active   tretinoin (RETIN-A) 0.025 % cream [id number]  Apply 1 Application topically daily.  Patient not taking: Reported on 01/15/2024   [provider]  Active               Assessment/Plan:   Diabetes: - Currently controlled - Reviewed long term cardiovascular and renal outcomes of uncontrolled blood sugar - Patient to have regular well-balanced meals and snacks throughout the day, while controlling carbohydrate portion sizes - Patient to follow up with [employer] as needed for refills of Rybelsus and with [employer] as needed for refills of Farxiga     Hypertension: - Recommend to monitor home blood pressure, keep log of results and have this record to review at upcoming medical appointments. Patient to contact provider office sooner if needed for readings outside of established parameters or  symptoms     Hyperlipidemia/ASCVD Risk Reduction: - [facility] rejecting related to needing a prior authorization Start PA via covermymeds, but find that per covermymeds/review of plan formulary from [employer], that Repatha, rather than Praluent is preferred through patient's plan Will collaborate with PCP to request provider switch patient from Praluent to Repatha due to patient's prescription coverage formulary Note per formulary, PA required for Repatha. Will start PA request via covermymeds - Will follow up again to assist patient with re-enrollment in [facility] grant before current grant enrollment ends ~02/29/2024       Follow Up Plan: Clinical Pharmacist will follow up with patient by telephone within the next 14 days    Estelle Grumbles, PharmD, [facility] [phone]

## 2024-02-06 ENCOUNTER — Other Ambulatory Visit: Payer: Self-pay | Admitting: Family Medicine

## 2024-02-06 MED ORDER — REPATHA SURECLICK 140 MG/ML ~~LOC~~ SOAJ: 140.0000 mg | SUBCUTANEOUS | 2 refills | Status: DC

## 2024-02-06 NOTE — Patient Instructions (Signed)
 Goals Addressed             This Visit's Progress    Pharmacy Goals       If you need to reach out to patient assistance programs regarding refills or to find out the status of your application, you can do so by calling:   [employer] at [phone] [employer] at [phone]   Our goal A1c is less than 7%. This corresponds with fasting sugars less than 130 and 2 hour after meal sugars less than 180. Please keep a log of your results when checking your blood sugar    Check your blood pressure once weekly, and any time you have concerning symptoms like headache, chest pain, dizziness, shortness of breath, or vision changes.   Our goal is less than 130/80.  To appropriately check your blood pressure, make sure you do the following:  1) Avoid caffeine, exercise, or tobacco products for 30 minutes before checking. Empty your bladder. 2) Sit with your back supported in a flat-backed chair. Rest your arm on something flat (arm of the chair, table, etc). 3) Sit still with your feet flat on the floor, resting, for at least 5 minutes.  4) Check your blood pressure. Take 1-2 readings.  5) Write down these readings and bring with you to any provider appointments.  Bring your home blood pressure machine with you to a provider's office for accuracy comparison at least once a year.   Make sure you take your blood pressure medications before you come to any office visit, even if you were asked to fast for labs.  Thank you!   Estelle Grumbles, PharmD, [facility] [phone]

## 2024-02-07 ENCOUNTER — Telehealth: Payer: Medicare HMO

## 2024-02-12 ENCOUNTER — Encounter: Payer: Self-pay | Admitting: Pharmacist

## 2024-02-12 ENCOUNTER — Other Ambulatory Visit: Payer: Self-pay | Admitting: Family Medicine

## 2024-02-12 ENCOUNTER — Other Ambulatory Visit: Payer: Self-pay | Admitting: Pharmacist

## 2024-02-12 DIAGNOSIS — E785 Hyperlipidemia, unspecified: Secondary | ICD-10-CM

## 2024-02-12 DIAGNOSIS — N1832 Type 2 diabetes mellitus with diabetic chronic kidney disease: Secondary | ICD-10-CM

## 2024-02-12 NOTE — Progress Notes (Signed)
   02/12/2024  Patient ID: Andrea Randolph, female   DOB: [birth year], 68 y.o.   MRN: [id number]  Collaborated with PCP to request provider switch patient from Praluent to Repatha due to patient's prescription coverage formulary   Dr. Carlynn Purl sent prescription for Repatha [facility] on 02/06/2024 PA for Repatha completed and approved through 11/11/2024  Today follow up with patient by telephone.  Reports that she has picked up and is planning to start her Repatha Sureclick today   - Provide counseling on Repatha. Patient denies allergy to latex or rubber.  Send patient the link for the "How to Use the SureClick Autoinjector" video as well as manufacturer Instructions for Use Handout via MyChart. Patient to review and contact office/pharmacy or clinical pharmacist with any questions prior to starting Repatha.  Assist patient with Re-enrollment in [employer]ch[employer]olemia - Medicare Access grant. Patient approved through 02/28/2025. - Send patient grant information for [employer] new grant enrollment via MyChart message  Enrollment Period: 03/01/2024 through 02/28/2025 [name] Amount: $2,500.00 [employer] Identification Number: [id number]   Pharmacy Billing Info:   ID: [id number] BIN: [id number] PCN: [id number] Group: [id number] Pharmacy Help Desk: [phone]  Follow Up Plan: Clinical Pharmacist will follow up with patient by telephone on 04/29/2024 at 2:30 PM  Estelle Grumbles, PharmD, [facility] [phone]

## 2024-02-12 NOTE — Therapy (Signed)
 Note entered in error

## 2024-02-12 NOTE — Patient Instructions (Signed)
 Goals Addressed             This Visit's Progress    Pharmacy Goals       If you need to reach out to patient assistance programs regarding refills or to find out the status of your application, you can do so by calling:   [employer] at [phone] [employer] at [phone]   Our goal A1c is less than 7%. This corresponds with fasting sugars less than 130 and 2 hour after meal sugars less than 180. Please keep a log of your results when checking your blood sugar    Check your blood pressure once weekly, and any time you have concerning symptoms like headache, chest pain, dizziness, shortness of breath, or vision changes.   Our goal is less than 130/80.  To appropriately check your blood pressure, make sure you do the following:  1) Avoid caffeine, exercise, or tobacco products for 30 minutes before checking. Empty your bladder. 2) Sit with your back supported in a flat-backed chair. Rest your arm on something flat (arm of the chair, table, etc). 3) Sit still with your feet flat on the floor, resting, for at least 5 minutes.  4) Check your blood pressure. Take 1-2 readings.  5) Write down these readings and bring with you to any provider appointments.  Bring your home blood pressure machine with you to a provider's office for accuracy comparison at least once a year.   Make sure you take your blood pressure medications before you come to any office visit, even if you were asked to fast for labs.  Thank you!   Estelle Grumbles, PharmD, [facility] [phone]

## 2024-02-21 ENCOUNTER — Ambulatory Visit

## 2024-02-21 VITALS — BP 126/74 | Ht 62.0 in | Wt 208.0 lb

## 2024-02-21 DIAGNOSIS — Z2821 Immunization not carried out because of patient refusal: Secondary | ICD-10-CM

## 2024-02-21 DIAGNOSIS — Z7984 Long term (current) use of oral hypoglycemic drugs: Secondary | ICD-10-CM | POA: Diagnosis not present

## 2024-02-21 DIAGNOSIS — E1169 Type 2 diabetes mellitus with other specified complication: Secondary | ICD-10-CM | POA: Diagnosis not present

## 2024-02-21 DIAGNOSIS — Z0001 Encounter for general adult medical examination with abnormal findings: Secondary | ICD-10-CM | POA: Diagnosis not present

## 2024-02-21 DIAGNOSIS — E785 Hyperlipidemia, unspecified: Secondary | ICD-10-CM

## 2024-02-21 DIAGNOSIS — Z0000 Encounter for general adult medical examination without abnormal findings: Secondary | ICD-10-CM

## 2024-02-21 NOTE — Patient Instructions (Signed)
 Ms. Wuest , Thank you for taking time to come for your Medicare Wellness Visit. I appreciate your ongoing commitment to your health goals. Please review the following plan we discussed and let me know if I can assist you in the future.   Referrals/Orders/Follow-Ups/Clinician Recommendations: Follow up next year for Annual Wellness   This is a list of the screening recommended for you and due dates:  Health Maintenance  Topic Date Due   COVID-19 Vaccine (1) Never done   Yearly kidney health urinalysis for diabetes  11/27/2023   Complete foot exam   11/27/2023   Hemoglobin A1C  05/14/2024   Flu Shot  06/12/2024   Yearly kidney function blood test for diabetes  09/09/2024   Eye exam for diabetics  01/18/2025   Colon Cancer Screening  02/09/2025   Medicare Annual Wellness Visit  02/20/2025   Mammogram  01/19/2026   DTaP/Tdap/Td vaccine (4 - Td or Tdap) 12/28/2029   Pneumonia Vaccine  Completed   DEXA scan (bone density measurement)  Completed   Hepatitis C Screening  Completed   Zoster (Shingles) Vaccine  Completed   HPV Vaccine  Aged Out   Meningitis B Vaccine  Aged Out    Advanced directives: (Declined) Advance directive discussed with you today. Even though you declined this today, please call our office should you change your mind, and we can give you the proper paperwork for you to fill out.  Next Medicare Annual Wellness Visit scheduled for next year: Yes

## 2024-02-21 NOTE — Progress Notes (Addendum)
 Because this visit was a virtual/telehealth visit,  certain criteria was not obtained, such a blood pressure, CBG if applicable, and timed get up and go. Any medications not marked as "taking" were not mentioned during the medication reconciliation part of the visit. Any vitals not documented were not able to be obtained due to this being a telehealth visit or patient was unable to self-report a recent blood pressure reading due to a lack of equipment at home via telehealth. Vitals that have been documented are verbally provided by the patient.  Subjective:   Andrea Randolph is a 68 y.o. who presents for a Medicare Wellness preventive visit.  Visit Complete: Virtual I connected with  Andrea Randolph on 02/21/24 by a video and audio enabled telemedicine application and verified that I am speaking with the correct person using two identifiers.  Patient Location: Home  Provider Location: Home Office  I discussed the limitations of evaluation and management by telemedicine. The patient expressed understanding and agreed to proceed.  Vital Signs: Because this visit was a virtual/telehealth visit, some criteria may be missing or patient reported. Any vitals not documented were not able to be obtained and vitals that have been documented are patient reported.  Persons Participating in Visit: Patient.  AWV Questionnaire: No: Patient Medicare AWV questionnaire was not completed prior to this visit.  Cardiac Risk Factors include: advanced age (>100men, >66 women);diabetes mellitus;obesity (BMI >30kg/m2);hypertension;dyslipidemia     Objective:    Today's Vitals   02/21/24 0904 02/21/24 0905  BP: 126/74   Weight: 208 lb (94.3 kg)   Height: 5\' 2"  (1.575 m)   PainSc:  7    Body mass index is 38.04 kg/m.     02/21/2024    9:04 AM 05/09/2023    9:02 AM 03/11/2023    8:36 AM 05/11/2022    8:59 AM 04/02/2022    2:47 PM 03/07/2022    9:57 AM 02/09/2022    6:58 AM  Advanced Directives  Does Patient Have  a Medical Advance Directive? No No No No No No No  Would patient like information on creating a medical advance directive? No - Patient declined No - Patient declined No - Patient declined No - Patient declined No - Patient declined      Current Medications (verified) Outpatient Encounter Medications as of 02/21/2024  Medication Sig   acetaminophen (TYLENOL) 500 MG tablet Take 500 mg by mouth every 6 (six) hours as needed.   aspirin 81 MG tablet Take 81 mg by mouth daily.   Calcium Carb-Cholecalciferol (CALCIUM 500+D3 PO) Take 1 tablet by mouth daily.   Cholecalciferol (VITAMIN D3) 1000 units CAPS Take 1,000 Units by mouth daily.   cyanocobalamin (VITAMIN B12) 1000 MCG tablet Take 1,000 mcg by mouth daily.   dapagliflozin propanediol (FARXIGA) 10 MG TABS tablet Take 1 tablet (10 mg total) by mouth daily. Through [place] Patient Assistance   Evolocumab (REPATHA SURECLICK) 140 MG/ML SOAJ Inject 140 mg into the skin every 14 (fourteen) days.   exemestane (AROMASIN) 25 MG tablet Take 1 tablet (25 mg total) by mouth daily.   ezetimibe (ZETIA) 10 MG tablet Take 1 tablet (10 mg total) by mouth daily.   levothyroxine (SYNTHROID) 125 MCG tablet TAKE 1 TABLET BY MOUTH DAILY   Olmesartan-amLODIPine-HCTZ 40-5-25 MG TABS Take 1 tablet by mouth daily at 12 noon.   Semaglutide (RYBELSUS) 14 MG TABS Take 1 tablet (14 mg total) by mouth daily at 6 (six) AM. Through [employer] Patient Assistance  clindamycin (CLEOCIN T) 1 % external solution Apply 1 Application topically every morning. (Patient not taking: Reported on 01/13/2024)   diclofenac Sodium (VOLTAREN) 1 % GEL Apply 2 g topically 4 (four) times daily as needed. (Patient not taking: Reported on 01/13/2024)   glucose blood test strip  (Patient not taking: Reported on 01/13/2024)   tretinoin (RETIN-A) 0.025 % cream Apply 1 Application topically daily. (Patient not taking: Reported on 01/13/2024)   No facility-administered encounter medications on file as of  02/21/2024.    Allergies (verified) Pantoprazole and Rosuvastatin   History: Past Medical History:  Diagnosis Date   Allergy    Anemia    Arthritis of right shoulder region    Dr. Katrinka Blazing   Cancer [facility])    breast cancer right side   Chronic insomnia    Diabetes mellitus without complication (HCC)    Family history of adverse reaction to anesthesia    sister with Nausea   GERD (gastroesophageal reflux disease)    Goiter    Dr. Tedd Sias   Heart murmur    Hyperlipidemia    Hypertension    Hypertensive retinopathy of left eye    Hypothyroidism, adult    Dr. Tedd Sias   Inflammation of urethra    Menopause    Microalbuminuria    DM   Mixed incontinence    Muscle cramps    Obesity    OSA (obstructive sleep apnea)    Personal history of radiation therapy    Breast Cancer Right breast   Radiculitis, lumbosacral    Vitamin D deficiency    Past Surgical History:  Procedure Laterality Date   BREAST BIOPSY Left 90's   benign core needle    BREAST BIOPSY Right 02/07/2021   [place] Bx, Q-clip, DCIS   BREAST LUMPECTOMY Right 03/06/2021   BREAST LUMPECTOMY WITH SENTINEL LYMPH NODE BIOPSY Right 03/06/2021   Procedure: BREAST LUMPECTOMY WITH SENTINEL LYMPH NODE BX;  Surgeon: Earline Mayotte, MD;  Location: [facility] ORS;  Service: General;  Laterality: Right;   COLONOSCOPY WITH PROPOFOL N/A 01/26/2019   Procedure: COLONOSCOPY WITH PROPOFOL;  Surgeon: Toney Reil, MD;  Location: [facility] ENDOSCOPY;  Service: Gastroenterology;  Laterality: N/A;   COLONOSCOPY WITH PROPOFOL N/A 02/09/2022   Procedure: COLONOSCOPY WITH PROPOFOL;  Surgeon: Wyline Mood, MD;  Location: [facility] ENDOSCOPY;  Service: Gastroenterology;  Laterality: N/A;   FOOT SURGERY Bilateral    heel spurs   JOINT REPLACEMENT     TOTAL SHOULDER REPLACEMENT Right 01/25/2015   Dr. Joice Lofts   TOTAL THYROIDECTOMY  08/01/2018   TRIGGER FINGER RELEASE Right 06/10/2023   ring finger - Dr. Rosita Kea   Family History  Problem Relation Age of  Onset   Heart disease Mother    Heart disease Father    Kidney disease Sister    Hypertension Sister    Diabetes Sister    Hypertension Sister    Kidney failure Sister    Hypertension Brother    CVA Brother    Breast cancer Maternal Aunt 50   Ovarian cancer Neg Hx    Colon cancer Neg Hx    Social History   Socioeconomic History   Marital status: Legally Separated    Spouse name: Not on file   Number of children: 1   Years of education: Not on file   Highest education level: 12th grade  Occupational History   Occupation: Biomedical scientist  Tobacco Use   Smoking status: Never   Smokeless tobacco: Never  Vaping Use  Vaping status: Never Used  Substance and Sexual Activity   Alcohol use: No    Alcohol/week: 0.0 standard drinks of alcohol   Drug use: No   Sexual activity: Not Currently    Birth control/protection: Post-menopausal  Other Topics Concern   Not on file  Social History Narrative   Separated in around 2009.   She was living with her daughter, however currently moved in with her sister and nephew , but is looking for a house.    Social Drivers of Corporate investment banker Strain: Low Risk  (02/21/2024)   Overall Financial Resource Strain (CARDIA)    Difficulty of Paying Living Expenses: Not hard at all  Food Insecurity: No Food Insecurity (02/21/2024)   Hunger Vital Sign    Worried About Running Out of Food in the Last Year: Never true    Ran Out of Food in the Last Year: Never true  Transportation Needs: No Transportation Needs (02/21/2024)   PRAPARE - Administrator, Civil Service (Medical): No    Lack of Transportation (Non-Medical): No  Physical Activity: Sufficiently Active (02/21/2024)   Exercise Vital Sign    Days of Exercise per Week: 3 days    Minutes of Exercise per Session: 90 min  Recent Concern: Physical Activity - Insufficiently Active (01/15/2024)   Exercise Vital Sign    Days of Exercise per Week: 3 days    Minutes of  Exercise per Session: 20 min  Stress: No Stress Concern Present (02/21/2024)   [employer] of Occupational Health - Occupational Stress Questionnaire    Feeling of Stress : Not at all  Social Connections: Moderately Integrated (02/21/2024)   Social Connection and Isolation Panel [NHANES]    Frequency of Communication with Friends and Family: More than three times a week    Frequency of Social Gatherings with Friends and Family: More than three times a week    Attends Religious Services: More than 4 times per year    Active Member of [employer] or Organizations: Yes    Attends Engineer, structural: More than 4 times per year    Marital Status: Separated    Tobacco Counseling Counseling given: Not Answered    Clinical Intake:  Pre-visit preparation completed: Yes  Pain : 0-10 Pain Score: 7  Pain Type: Chronic pain Pain Location:  (waist down) Pain Orientation: Lower Pain Descriptors / Indicators: Aching     BMI - recorded: 28.04 Nutritional Status: BMI 25 -29 Overweight Nutritional Risks: None Diabetes: Yes CBG done?: No Did pt. bring in CBG monitor from home?: No  Lab Results  Component Value Date   HGBA1C 7.1 (A) 11/15/2023   HGBA1C 6.8 (H) 07/11/2023   HGBA1C 6.5 (A) 03/27/2023     How often do you need to have someone help you when you read instructions, pamphlets, or other written materials from your doctor or pharmacy?: 1 - Never What is the last grade level you completed in school?: 12th grade  Interpreter Needed?: No  Information entered by :: [employer]   Activities of Daily Living     02/21/2024    9:09 AM 07/29/2023    8:49 AM  In your present state of health, do you have any difficulty performing the following activities:  Hearing? 0 0  Vision? 0 0  Difficulty concentrating or making decisions? 0 0  Walking or climbing stairs? 0 0  Dressing or bathing? 0 0  Doing errands, shopping? 0 0  Preparing Food and  eating ? N   Using  the Toilet? N   In the past six months, have you accidently leaked urine? Y   Do you have problems with loss of bowel control? N   Managing your Medications? N   Managing your Finances? N   Housekeeping or managing your Housekeeping? N     Patient Care Team: Alba Cory, MD as PCP - General (Family Medicine) Mariah Milling Tollie Pizza, MD as PCP - Cardiology (Cardiology) Jim Like, RN as Oncology Nurse Navigator Rickard Patience, MD as Consulting Physician (Oncology) Lemar Livings Merrily Pew, MD as Consulting Physician (General Surgery) Alba Cory, MD as Attending Physician (Family Medicine) Patterson Hammersmith, MD as Consulting Physician (Rheumatology) Carmina Miller, MD as Consulting Physician (Radiation Oncology) Myrtis Ser as Physician Assistant (Orthopedic Surgery) Ronney Asters Jackelyn Poling, RPH-CPP as Pharmacist  Indicate any recent Medical Services you may have received from other than [facility] providers in the past year (date may be approximate).     Assessment:   This is a routine wellness examination for [facility].  Hearing/Vision screen Hearing Screening - Comments:: No hearing difficulties Vision Screening - Comments:: Patient wears glasses   Goals Addressed             This Visit's Progress    Patient Stated       Patient states she wants to overcome the pain from cancer       Depression Screen     02/21/2024    9:11 AM 01/15/2024    8:05 AM 11/15/2023    3:18 PM 07/29/2023    8:48 AM 07/11/2023   10:57 AM 03/27/2023   10:17 AM 01/30/2023    1:33 PM  PHQ 2/9 Scores  PHQ - 2 Score 0 0 0 2 0 0 0  PHQ- 9 Score 3 0 0 6  0 0    Fall Risk     02/21/2024    9:09 AM 01/15/2024    8:05 AM 11/15/2023    3:09 PM 07/29/2023    8:48 AM 07/11/2023   10:57 AM  Fall Risk   Falls in the past year? 0 0 0 0 0  Number falls in past yr: 0 0 0  0  Injury with Fall? 0 0 0  0  Risk for fall due to : No Fall Risks No Fall Risks No Fall Risks No Fall Risks   Follow up Falls evaluation  completed Falls prevention discussed;Education provided;Falls evaluation completed Falls prevention discussed;Education provided;Falls evaluation completed Falls prevention discussed     MEDICARE RISK AT HOME:  Medicare Risk at Home Any stairs in or around the home?: Yes If so, are there any without handrails?: No Home free of loose throw rugs in walkways, pet beds, electrical cords, etc?: Yes Adequate lighting in your home to reduce risk of falls?: Yes Life alert?: No Use of a cane, walker or w/c?: No Grab bars in the bathroom?: Yes Shower chair or bench in shower?: No Elevated toilet seat or a handicapped toilet?: Yes  TIMED UP AND GO:  Was the test performed?  No  Cognitive Function: 6CIT completed        02/21/2024    9:07 AM  6CIT Screen  What Year? 0 points  What month? 0 points  What time? 0 points  Count back from 20 0 points  Months in reverse 0 points  Repeat phrase 0 points  Total Score 0 points    Immunizations Immunization History  Administered Date(s) Administered  Fluad Quad(high Dose 65+) 11/26/2022   Fluad Trivalent(High Dose 65+) 07/29/2023   Influenza, Seasonal, Injecte, Preservative Fre 07/09/2011, 11/20/2012   Influenza,inj,Quad PF,6+ Mos 08/02/2014, 08/29/2015, 09/25/2016, 09/29/2018, 10/26/2019, 12/06/2021   Influenza-Unspecified 08/02/2014, 08/27/2020   PNEUMOCOCCAL CONJUGATE-20 11/26/2022   Pneumococcal Conjugate-13 08/29/2015   Pneumococcal Polysaccharide-23 06/13/2010   Tdap 03/07/2010, 09/28/2014, 12/29/2019   Zoster Recombinant(Shingrix) 03/26/2018, 12/29/2018    Screening Tests Health Maintenance  Topic Date Due   COVID-19 Vaccine (1) Never done   Diabetic kidney evaluation - Urine ACR  11/27/2023   FOOT EXAM  11/27/2023   HEMOGLOBIN A1C  05/14/2024   INFLUENZA VACCINE  06/12/2024   Diabetic kidney evaluation - eGFR measurement  09/09/2024   OPHTHALMOLOGY EXAM  01/18/2025   Colonoscopy  02/09/2025   Medicare Annual Wellness  (AWV)  02/20/2025   MAMMOGRAM  01/19/2026   DTaP/Tdap/Td (4 - Td or Tdap) 12/28/2029   Pneumonia Vaccine 34+ Years old  Completed   DEXA SCAN  Completed   Hepatitis C Screening  Completed   Zoster Vaccines- Shingrix  Completed   HPV VACCINES  Aged Out   Meningococcal B Vaccine  Aged Out    Health Maintenance  Health Maintenance Due  Topic Date Due   COVID-19 Vaccine (1) Never done   Diabetic kidney evaluation - Urine ACR  11/27/2023   FOOT EXAM  11/27/2023   Health Maintenance Items Addressed: UACR (Urine Albumin:Creatinine Ratio)  Additional Screening:  Vision Screening: Recommended annual ophthalmology exams for early detection of glaucoma and other disorders of the eye.  Dental Screening: Recommended annual dental exams for proper oral hygiene  Community Resource Referral / Chronic Care Management: CRR required this visit?  No   CCM required this visit?  No     Plan:     I have personally reviewed and noted the following in the patient's chart:   Medical and social history Use of alcohol, tobacco or illicit drugs  Current medications and supplements including opioid prescriptions. Patient is not currently taking opioid prescriptions. Functional ability and status Nutritional status Physical activity Advanced directives List of other physicians Hospitalizations, surgeries, and ER visits in previous 12 months Vitals Screenings to include cognitive, depression, and falls Referrals and appointments  In addition, I have reviewed and discussed with patient certain preventive protocols, quality metrics, and best practice recommendations. A written personalized care plan for preventive services as well as general preventive health recommendations were provided to patient.     Rudi Heap, [place]   02/21/2024   After Visit Summary: (MyChart) Due to this being a telephonic visit, the after visit summary with patients personalized plan was offered to patient via  MyChart   Notes: Nothing significant to report at this time.

## 2024-03-09 ENCOUNTER — Inpatient Hospital Stay: Payer: Medicare HMO | Admitting: Oncology

## 2024-03-09 ENCOUNTER — Inpatient Hospital Stay: Payer: Medicare HMO | Attending: Oncology

## 2024-03-09 ENCOUNTER — Encounter: Payer: Self-pay | Admitting: Oncology

## 2024-03-09 VITALS — BP 147/84 | HR 72 | Temp 96.3°F | Resp 18 | Wt 213.1 lb

## 2024-03-09 DIAGNOSIS — C50211 Malignant neoplasm of upper-inner quadrant of right female breast: Secondary | ICD-10-CM

## 2024-03-09 DIAGNOSIS — Z170 Estrogen receptor positive status [ER+]: Secondary | ICD-10-CM

## 2024-03-09 DIAGNOSIS — Z1721 Progesterone receptor positive status: Secondary | ICD-10-CM | POA: Insufficient documentation

## 2024-03-09 DIAGNOSIS — Z923 Personal history of irradiation: Secondary | ICD-10-CM | POA: Diagnosis not present

## 2024-03-09 DIAGNOSIS — N1831 Chronic kidney disease, stage 3a: Secondary | ICD-10-CM | POA: Insufficient documentation

## 2024-03-09 DIAGNOSIS — Z79811 Long term (current) use of aromatase inhibitors: Secondary | ICD-10-CM

## 2024-03-09 DIAGNOSIS — Z1732 Human epidermal growth factor receptor 2 negative status: Secondary | ICD-10-CM | POA: Insufficient documentation

## 2024-03-09 DIAGNOSIS — Z79899 Other long term (current) drug therapy: Secondary | ICD-10-CM | POA: Diagnosis not present

## 2024-03-09 LAB — CMP (CANCER CENTER ONLY)
ALT: 22 U/L (ref 0–44)
AST: 24 U/L (ref 15–41)
Albumin: 3.7 g/dL (ref 3.5–5.0)
Alkaline Phosphatase: 51 U/L (ref 38–126)
Anion gap: 10 (ref 5–15)
BUN: 24 mg/dL — ABNORMAL HIGH (ref 8–23)
CO2: 25 mmol/L (ref 22–32)
Calcium: 9.1 mg/dL (ref 8.9–10.3)
Chloride: 103 mmol/L (ref 98–111)
Creatinine: 1.33 mg/dL — ABNORMAL HIGH (ref 0.44–1.00)
GFR, Estimated: 44 mL/min — ABNORMAL LOW (ref >60)
Glucose, Bld: 153 mg/dL — ABNORMAL HIGH (ref 70–99)
Potassium: 3.6 mmol/L (ref 3.5–5.1)
Sodium: 138 mmol/L (ref 135–145)
Total Bilirubin: 0.3 mg/dL (ref 0.0–1.2)
Total Protein: 7.1 g/dL (ref 6.5–8.1)

## 2024-03-09 LAB — CBC WITH DIFFERENTIAL (CANCER CENTER ONLY)
Abs Immature Granulocytes: 0.02 10*3/uL (ref 0.00–0.07)
Basophils Absolute: 0 10*3/uL (ref 0.0–0.1)
Basophils Relative: 1 %
Eosinophils Absolute: 0.1 10*3/uL (ref 0.0–0.5)
Eosinophils Relative: 2 %
HCT: 42.2 % (ref 36.0–46.0)
Hemoglobin: 13.6 g/dL (ref 12.0–15.0)
Immature Granulocytes: 0 %
Lymphocytes Relative: 38 %
Lymphs Abs: 1.8 10*3/uL (ref 0.7–4.0)
MCH: 26.9 pg (ref 26.0–34.0)
MCHC: 32.2 g/dL (ref 30.0–36.0)
MCV: 83.4 fL (ref 80.0–100.0)
Monocytes Absolute: 0.3 10*3/uL (ref 0.1–1.0)
Monocytes Relative: 6 %
Neutro Abs: 2.5 10*3/uL (ref 1.7–7.7)
Neutrophils Relative %: 53 %
Platelet Count: 322 10*3/uL (ref 150–400)
RBC: 5.06 MIL/uL (ref 3.87–5.11)
RDW: 14.6 % (ref 11.5–15.5)
WBC Count: 4.7 10*3/uL (ref 4.0–10.5)
nRBC: 0 % (ref 0.0–0.2)

## 2024-03-09 MED ORDER — EXEMESTANE 25 MG PO TABS: 25.0000 mg | ORAL_TABLET | Freq: Every day | ORAL | 1 refills | Status: DC

## 2024-03-09 NOTE — Assessment & Plan Note (Signed)
 Encourage oral hydration and avoid nephrotoxins.

## 2024-03-09 NOTE — Assessment & Plan Note (Addendum)
 Right breast invasive carcinoma.  pT1a pN0, ER positive, PR positive, HER2 negative. Focal DCIS margin positive.Patient declined reexcision.s/p adjuvant radiation.  Unable to tolerate Arimidex   switched to Aromasin . Labs reviewed and discussed with patient Continue Aromasin  25mg  daily. -Encourage patient to take Aromasin  daily Continue annual mammogram,  March 2025

## 2024-03-09 NOTE — Progress Notes (Signed)
 Hematology/Oncology Progress note [phone] [phone]) [phone]     CHIEF COMPLAINTS/REASON FOR VISIT:  Follow up of breast cancer.    ASSESSMENT & PLAN:   Cancer Staging  Malignant 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, estrogen receptor positive (HCC) Staging form: Breast, AJCC 8th Edition - Pathologic stage from 03/20/2021: Stage IA (pT1a, pN0, cM0, G2, ER+, PR+, HER2-) - Signed by Timmy Forbes, MD on 03/20/2021   Malignant 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, estrogen receptor positive (HCC) Right breast invasive carcinoma.  pT1a pN0, ER positive, PR positive, HER2 negative. Focal DCIS margin positive.Patient declined reexcision.s/p adjuvant radiation.  Unable to tolerate Arimidex   switched to Aromasin . Labs reviewed and discussed with patient Continue Aromasin  25mg  daily. -Encourage patient to take Aromasin  daily Continue annual mammogram,  March 2025  Aromatase inhibitor use Recommend calcium  and vitamin D  Repeat DEXA in July 2024 showed normal bone density.   Stage 3a chronic kidney disease (CKD) (HCC) Encourage oral hydration and avoid nephrotoxins.    Orders Placed This Encounter  Procedures   CBC with Differential (Cancer Center Only)    Standing Status:   Future    Expected Date:   09/08/2024    Expiration Date:   03/09/2025   CMP (Cancer Center only)    Standing Status:   Future    Expected Date:   09/08/2024    Expiration Date:   03/09/2025   Recommend 6 months.  All questions were answered. The patient knows to call the clinic with any problems, questions or concerns.  Timmy Forbes, MD, PhD [facility] Hematology Oncology 03/09/2024      HISTORY OF PRESENTING ILLNESS:   Andrea Randolph is a  68 y.o.  female with PMH listed below was seen in consultation at the request of  Sowles, Krichna, MD  for evaluation of breast cancer.   01/12/21 screening mammogram showed focal asymmetry with possible distortion.   01/26/21 right diagnostic mammogram and US  showed 3x5x61mm hypoechoic mass, s/p biopsy on 02/07/21 Pathology showed invasive mammary carcinoma, grade 2, ER90% PR 90%, HER2 negative.   Patient was seen by Dr.Byrnett yesterday. She presents to establish care with oncology Accompanied by her daughter  Menarche 49 years with Age at first childbirth 31.  OCP use for about 20 years Denies any estrogen replacement. LMP in 81s. Denies any previous chest radiation. History of left breast biopsy previously. Family history Father for maternal aunt who was diagnosed of breast cancer at age of 71.  03/06/2021 lumpectomy and sentinel lymph node biopsy. Pathology showed invasive mammary carcinoma, no special type, DCIS in situ, intermediate grade.  All 3 sentinel lymph nodes were negative.  1 nonsentinel lymph node was negative.  Margins for invasive carcinoma was negative.  Margins for DCIS was unifocal positive. Patient declined reexcision.Patient's case was discussed on breast cancer tumor board on 03/20/2021.  # 05/03/2021 finished adjuvant radiation.  05/09/2021, patient started on Arimidex .  Patient called to report that she has experienced significant leg pain, back pain, neck pain shortly after starting Arimidex .  left knee pain and swelling.  06/20/2021, left knee x-ray showed diffuse joint space narrowing of the left knee with spurring at the lateral compartment.  Small joint effusion.  07/20/2021 patient was seen by orthopedic surgeon for left knee arthritis.  Patient received intra-articular corticosteroid injection and left knee pain has improved.  01/15/2022  bilateral diagnostic mammogram results were reviewed and discussed with patient.  Continue annual mammogram. Bone health, 05/16/2021,  normal bone density.  INTERVAL HISTORY Andrea Randolph is a 68 y.o. female who has above history reviewed by me today presents for follow up visit for management of breast cancer + Chronic Arthralgia and hot flash,  manageable.  she takes Aromasin  on some days occationally she gets sharp breast pain Otherwise no new complaints.   Review of Systems  Constitutional:  Negative for appetite change, chills, fatigue and fever.  HENT:   Negative for hearing loss and voice change.   Eyes:  Negative for eye problems.  Respiratory:  Negative for chest tightness and cough.   Cardiovascular:  Negative for chest pain.  Gastrointestinal:  Negative for abdominal distention, abdominal pain and blood in stool.  Endocrine: Negative for hot flashes.  Genitourinary:  Negative for difficulty urinating and frequency.   Musculoskeletal:  Positive for arthralgias.  Skin:  Negative for itching and rash.  Neurological:  Negative for extremity weakness.  Hematological:  Negative for adenopathy.  Psychiatric/Behavioral:  Negative for confusion. The patient is not nervous/anxious.     MEDICAL HISTORY:  Past Medical History:  Diagnosis Date   Allergy    Anemia    Arthritis of right shoulder region    Dr. Felipe Horton   Cancer [facility])    breast cancer right side   Chronic insomnia    Diabetes mellitus without complication (HCC)    Family history of adverse reaction to anesthesia    sister with Nausea   GERD (gastroesophageal reflux disease)    Goiter    Dr. Lorelei Rogers   Heart murmur    Hyperlipidemia    Hypertension    Hypertensive retinopathy of left eye    Hypothyroidism, adult    Dr. Lorelei Rogers   Inflammation of urethra    Menopause    Microalbuminuria    DM   Mixed incontinence    Muscle cramps    Obesity    OSA (obstructive sleep apnea)    Personal history of radiation therapy    Breast Cancer Right breast   Radiculitis, lumbosacral    Vitamin D  deficiency     SURGICAL HISTORY: Past Surgical History:  Procedure Laterality Date   BREAST BIOPSY Left 90's   benign core needle    BREAST BIOPSY Right 02/07/2021   US  Bx, Q-clip, DCIS   BREAST LUMPECTOMY Right 03/06/2021   BREAST LUMPECTOMY WITH SENTINEL LYMPH NODE  BIOPSY Right 03/06/2021   Procedure: BREAST LUMPECTOMY WITH SENTINEL LYMPH NODE BX;  Surgeon: Marshall Skeeter, MD;  Location: [facility] ORS;  Service: General;  Laterality: Right;   COLONOSCOPY WITH PROPOFOL  N/A 01/26/2019   Procedure: COLONOSCOPY WITH PROPOFOL ;  Surgeon: Selena Daily, MD;  Location: [facility] ENDOSCOPY;  Service: Gastroenterology;  Laterality: N/A;   COLONOSCOPY WITH PROPOFOL  N/A 02/09/2022   Procedure: COLONOSCOPY WITH PROPOFOL ;  Surgeon: Luke Salaam, MD;  Location: [facility] ENDOSCOPY;  Service: Gastroenterology;  Laterality: N/A;   FOOT SURGERY Bilateral    heel spurs   JOINT REPLACEMENT     TOTAL SHOULDER REPLACEMENT Right 01/25/2015   Dr. Daun Epstein   TOTAL THYROIDECTOMY  08/01/2018   TRIGGER FINGER RELEASE Right 06/10/2023   ring finger - Dr. Mozell Arias    SOCIAL HISTORY: Social History   Socioeconomic History   Marital status: Legally Separated    Spouse name: Not on file   Number of children: 1   Years of education: Not on file   Highest education level: 12th grade  Occupational History   Occupation: Biomedical scientist  Tobacco Use   Smoking  status: Never   Smokeless tobacco: Never  Vaping Use   Vaping status: Never Used  Substance and Sexual Activity   Alcohol use: No    Alcohol/week: 0.0 standard drinks of alcohol   Drug use: No   Sexual activity: Not Currently    Birth control/protection: Post-menopausal  Other Topics Concern   Not on file  Social History Narrative   Separated in around 2009.   She was living with her daughter, however currently moved in with her sister and nephew , but is looking for a house.    Social Drivers of Corporate investment banker Strain: Low Risk  (02/21/2024)   Overall Financial Resource Strain (CARDIA)    Difficulty of Paying Living Expenses: Not hard at all  Food Insecurity: No Food Insecurity (02/21/2024)   Hunger Vital Sign    Worried About Running Out of Food in the Last Year: Never true    Ran Out of Food in the Last  Year: Never true  Transportation Needs: No Transportation Needs (02/21/2024)   PRAPARE - Administrator, Civil Service (Medical): No    Lack of Transportation (Non-Medical): No  Physical Activity: Sufficiently Active (02/21/2024)   Exercise Vital Sign    Days of Exercise per Week: 3 days    Minutes of Exercise per Session: 90 min  Recent Concern: Physical Activity - Insufficiently Active (01/15/2024)   Exercise Vital Sign    Days of Exercise per Week: 3 days    Minutes of Exercise per Session: 20 min  Stress: No Stress Concern Present (02/21/2024)   [employer] of Occupational Health - Occupational Stress Questionnaire    Feeling of Stress : Not at all  Social Connections: Moderately Integrated (02/21/2024)   Social Connection and Isolation Panel [NHANES]    Frequency of Communication with Friends and Family: More than three times a week    Frequency of Social Gatherings with Friends and Family: More than three times a week    Attends Religious Services: More than 4 times per year    Active Member of [employer] or Organizations: Yes    Attends Banker Meetings: More than 4 times per year    Marital Status: Separated  Intimate Partner Violence: Not At Risk (02/21/2024)   Humiliation, Afraid, Rape, and Kick questionnaire    Fear of Current or Ex-Partner: No    Emotionally Abused: No    Physically Abused: No    Sexually Abused: No    FAMILY HISTORY: Family History  Problem Relation Age of Onset   Heart disease Mother    Heart disease Father    Kidney disease Sister    Hypertension Sister    Diabetes Sister    Hypertension Sister    Kidney failure Sister    Hypertension Brother    CVA Brother    Breast cancer Maternal Aunt 50   Ovarian cancer Neg Hx    Colon cancer Neg Hx     ALLERGIES:  is allergic to pantoprazole  and rosuvastatin .  MEDICATIONS:  Current Outpatient Medications  Medication Sig Dispense Refill   acetaminophen  (TYLENOL ) 500 MG tablet  Take 500 mg by mouth every 6 (six) hours as needed.     Calcium  Carb-Cholecalciferol (CALCIUM  500+D3 PO) Take 1 tablet by mouth daily.     Cholecalciferol (VITAMIN D3) 1000 units CAPS Take 1,000 Units by mouth daily.     cyanocobalamin  (VITAMIN B12) 1000 MCG tablet Take 1,000 mcg by mouth daily.     dapagliflozin   propanediol (FARXIGA ) 10 MG TABS tablet Take 1 tablet (10 mg total) by mouth daily. Through [place] Patient Assistance 90 tablet 3   Evolocumab  (REPATHA  SURECLICK) 140 MG/ML SOAJ Inject 140 mg into the skin every 14 (fourteen) days. 2 mL 2   ezetimibe  (ZETIA ) 10 MG tablet Take 1 tablet (10 mg total) by mouth daily. 90 tablet 1   glucose blood test strip      levothyroxine  (SYNTHROID ) 125 MCG tablet TAKE 1 TABLET BY MOUTH DAILY 30 tablet 0   Olmesartan -amLODIPine -HCTZ 40-5-25 MG TABS Take 1 tablet by mouth daily at 12 noon. 90 tablet 1   Semaglutide  (RYBELSUS ) 14 MG TABS Take 1 tablet (14 mg total) by mouth daily at 6 (six) AM. Through [employer] Patient Assistance 90 tablet 0   aspirin 81 MG tablet Take 81 mg by mouth daily.     clindamycin (CLEOCIN T) 1 % external solution Apply 1 Application topically every morning. (Patient not taking: Reported on 03/09/2024)     diclofenac  Sodium (VOLTAREN ) 1 % GEL Apply 2 g topically 4 (four) times daily as needed. (Patient not taking: Reported on 01/13/2024) 100 g 2   exemestane  (AROMASIN ) 25 MG tablet Take 1 tablet (25 mg total) by mouth daily. 90 tablet 1   tretinoin (RETIN-A) 0.025 % cream Apply 1 Application topically daily. (Patient not taking: Reported on 03/09/2024)     No current facility-administered medications for this visit.     PHYSICAL EXAMINATION: ECOG PERFORMANCE STATUS: 0 - Asymptomatic Vitals:   03/09/24 1037  BP: (!) 147/84  Pulse: 72  Resp: 18  Temp: (!) 96.3 F (35.7 C)  SpO2: 97%   Filed Weights   03/09/24 1037  Weight: 213 lb 1.6 oz (96.7 kg)    Physical Exam Constitutional:      General: She is not in acute  distress. HENT:     Head: Normocephalic and atraumatic.  Eyes:     General: No scleral icterus. Cardiovascular:     Rate and Rhythm: Normal rate and regular rhythm.     Heart sounds: Murmur heard.  Pulmonary:     Effort: Pulmonary effort is normal. No respiratory distress.     Breath sounds: No wheezing.  Abdominal:     General: Bowel sounds are normal. There is no distension.     Palpations: Abdomen is soft.  Musculoskeletal:        General: No deformity. Normal range of motion.     Cervical back: Normal range of motion and neck supple.  Skin:    General: Skin is warm and dry.     Findings: No erythema or rash.  Neurological:     Mental Status: She is alert and oriented to person, place, and time. Mental status is at baseline.     Cranial Nerves: No cranial nerve deficit.     Coordination: Coordination normal.  Psychiatric:        Mood and Affect: Mood normal.    Breast exam was performed in seated and lying down position. Patient is status post right lumpectomy with a well-healed surgical scar.   No palpable breast masses bilaterally.  No palpable axillary adenopathy bilaterally.   LABORATORY DATA:  I have reviewed the data as listed    Latest Ref Rng & Units 03/09/2024   10:21 AM 09/10/2023    9:44 AM 07/11/2023   11:34 AM  CBC  WBC 4.0 - 10.5 K/uL 4.7  3.7  4.2   Hemoglobin 12.0 - 15.0 g/dL 13.6  14.1  13.9   Hematocrit 36.0 - 46.0 % 42.2  42.8  42.0   Platelets 150 - 400 K/uL 322  382  380       Latest Ref Rng & Units 03/09/2024   10:21 AM 09/10/2023    9:44 AM 07/11/2023   11:34 AM  CMP  Glucose 70 - 99 mg/dL 562  130  865   BUN 8 - 23 mg/dL 24  15  13    Creatinine 0.44 - 1.00 mg/dL 7.84  6.96  2.95   Sodium 135 - 145 mmol/L 138  138  140   Potassium 3.5 - 5.1 mmol/L 3.6  3.4  3.9   Chloride 98 - 111 mmol/L 103  103  100   CO2 22 - 32 mmol/L 25  27  29    Calcium  8.9 - 10.3 mg/dL 9.1  9.2  28.4   Total Protein 6.5 - 8.1 g/dL 7.1  7.4  7.3   Total Bilirubin  0.0 - 1.2 mg/dL 0.3  0.5  0.5   Alkaline Phos 38 - 126 U/L 51  44    AST 15 - 41 U/L 24  18  15    ALT 0 - 44 U/L 22  19  13       RADIOGRAPHIC STUDIES: I have personally reviewed the radiological images as listed and agreed with the findings in the report. No results found.

## 2024-03-09 NOTE — Assessment & Plan Note (Signed)
Recommend calcium and vitamin D Repeat DEXA in July 2024 showed normal bone density.

## 2024-03-10 ENCOUNTER — Other Ambulatory Visit: Payer: Medicare HMO

## 2024-03-10 ENCOUNTER — Ambulatory Visit: Payer: Medicare HMO | Admitting: Oncology

## 2024-03-14 ENCOUNTER — Other Ambulatory Visit: Payer: Self-pay | Admitting: Family Medicine

## 2024-03-14 DIAGNOSIS — I119 Hypertensive heart disease without heart failure: Secondary | ICD-10-CM

## 2024-03-18 ENCOUNTER — Ambulatory Visit: Payer: Self-pay | Admitting: Family Medicine

## 2024-03-18 ENCOUNTER — Other Ambulatory Visit: Payer: Self-pay | Admitting: Family Medicine

## 2024-03-18 ENCOUNTER — Encounter: Payer: Self-pay | Admitting: Family Medicine

## 2024-03-18 VITALS — BP 122/74 | HR 95 | Resp 16 | Ht 62.0 in | Wt 213.2 lb

## 2024-03-18 DIAGNOSIS — G720 Drug-induced myopathy: Secondary | ICD-10-CM

## 2024-03-18 DIAGNOSIS — E785 Hyperlipidemia, unspecified: Secondary | ICD-10-CM | POA: Diagnosis not present

## 2024-03-18 DIAGNOSIS — G4733 Obstructive sleep apnea (adult) (pediatric): Secondary | ICD-10-CM | POA: Diagnosis not present

## 2024-03-18 DIAGNOSIS — E1169 Type 2 diabetes mellitus with other specified complication: Secondary | ICD-10-CM | POA: Diagnosis not present

## 2024-03-18 DIAGNOSIS — I119 Hypertensive heart disease without heart failure: Secondary | ICD-10-CM

## 2024-03-18 DIAGNOSIS — T466X5A Adverse effect of antihyperlipidemic and antiarteriosclerotic drugs, initial encounter: Secondary | ICD-10-CM

## 2024-03-18 DIAGNOSIS — E890 Postprocedural hypothyroidism: Secondary | ICD-10-CM

## 2024-03-18 DIAGNOSIS — C50411 Malignant neoplasm of upper-outer quadrant of right female breast: Secondary | ICD-10-CM

## 2024-03-18 DIAGNOSIS — N1831 Chronic kidney disease, stage 3a: Secondary | ICD-10-CM | POA: Diagnosis not present

## 2024-03-18 DIAGNOSIS — Z170 Estrogen receptor positive status [ER+]: Secondary | ICD-10-CM | POA: Diagnosis not present

## 2024-03-18 DIAGNOSIS — I272 Pulmonary hypertension, unspecified: Secondary | ICD-10-CM

## 2024-03-18 LAB — POCT GLYCOSYLATED HEMOGLOBIN (HGB A1C): Hemoglobin A1C: 6.5 % — AB (ref 4.0–5.6)

## 2024-03-18 MED ORDER — LEVOTHYROXINE SODIUM 125 MCG PO TABS: 125.0000 ug | ORAL_TABLET | Freq: Every day | ORAL | 0 refills | Status: DC

## 2024-03-18 MED ORDER — DAPAGLIFLOZIN PROPANEDIOL 10 MG PO TABS: 10.0000 mg | ORAL_TABLET | Freq: Every day | ORAL | 3 refills | Status: DC

## 2024-03-18 MED ORDER — OLMESARTAN-AMLODIPINE-HCTZ 40-5-25 MG PO TABS: 1.0000 | ORAL_TABLET | Freq: Every day | ORAL | 1 refills | Status: DC

## 2024-03-18 MED ORDER — EZETIMIBE 10 MG PO TABS: 10.0000 mg | ORAL_TABLET | Freq: Every day | ORAL | 1 refills | Status: DC

## 2024-03-18 NOTE — Progress Notes (Signed)
 Name: Andrea Randolph   MRN: [id number]    DOB: 1956/07/20   Date:03/18/2024       Progress Note  Subjective  Chief Complaint  Chief Complaint  Patient presents with   Medical Management of Chronic Issues   Discussed the use of AI scribe software for clinical note transcription with the patient, who gave verbal consent to proceed.  History of Present Illness Andrea Randolph is a 68 year old female with diabetes and chronic kidney disease who presents for a routine follow-up.  Her diabetes management has shown improvement with an A1c reduction from 7.1 to 6.5, attributed to increased physical activity, including attending a boot camp three times a week. She is currently on Farxiga  and Rybelsus  14 mg, both obtained through assistance programs. No symptoms of hyperglycemia such as excessive hunger, thirst, or frequent urination.  She has chronic kidney disease stage 3A, with her last GFR recorded at 44. She is due for a urine protein test, which has not been completed yet. She does not see a kidney specialist.   For dyslipidemia, she is on Repatha  and Zetia , with her last cholesterol check in August. She plans to have it rechecked in August. No side effects from these medications.  She has a history of breast cancer and is currently on Aromasin , which causes side effects such as hot flashes and joint aches. These joint aches are significant, and she avoids statins due to concerns about myopathy.  She experiences diabetic neuropathy but describes the pain as not terrible and is not taking any medication for it.   She previously used topical clindamycin and Retin A for facial hyperpigmentation but discontinued due to concerns about cancer risk.  Her hypertension is well-controlled with Tribenzor 40/25 mg, and she reports no side effects. No chest pain or palpitation and has good exercise tolerance  She also has a history of thyroidectomy and is on levothyroxine , [facility]  She has  pulmonary hypertension and sleep apnea but does not use a CPAP machine.   She has obesity with a BMI over 35 with co-morbidities such as DM,HTN but  is actively working on weight loss through increased physical activity.    Patient Active Problem List   Diagnosis Date Noted   Diabetes mellitus with microalbuminuria (HCC) 11/15/2023   Chronic bilateral low back pain with left-sided sciatica 07/11/2023   Genetic testing 03/28/2022   Stage 3a chronic kidney disease (CKD) (HCC) 03/07/2022   Aromatase inhibitor use 05/30/2021   Malignant 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, estrogen receptor positive (HCC) 02/20/2021   Carpal tunnel syndrome, bilateral 02/19/2019   Trigger finger of right thumb 02/19/2019   Flexor tenosynovitis of finger 02/19/2019   Mild concentric left ventricular hypertrophy (LVH) 12/24/2017   Mild aortic stenosis by prior echocardiogram 12/24/2017   Mild aortic regurgitation 12/24/2017   Uterine fibroid 01/11/2016   Allergic rhinitis, seasonal 05/24/2015   Benign essential HTN 05/24/2015   Chronic constipation 05/24/2015   Dyslipidemia 05/24/2015   Post-surgical hypothyroidism 05/24/2015   Mixed incontinence 05/24/2015   Microalbuminuria 05/24/2015   Menopause 05/24/2015   Obstructive apnea 05/24/2015   Morbid obesity (HCC) 05/24/2015   Hypertensive retinopathy 05/24/2015   Vitamin D  deficiency 05/24/2015   Status post total replacement of right shoulder 03/11/2015    Past Surgical History:  Procedure Laterality Date   BREAST BIOPSY Left 90's   benign core needle    BREAST BIOPSY Right 02/07/2021   US  Bx,  Q-clip, DCIS   BREAST LUMPECTOMY Right 03/06/2021   BREAST LUMPECTOMY WITH SENTINEL LYMPH NODE BIOPSY Right 03/06/2021   Procedure: BREAST LUMPECTOMY WITH SENTINEL LYMPH NODE BX;  Surgeon: Marshall Skeeter, MD;  Location: [facility] ORS;  Service: General;  Laterality: Right;   COLONOSCOPY WITH PROPOFOL  N/A 01/26/2019   Procedure:  COLONOSCOPY WITH PROPOFOL ;  Surgeon: Selena Daily, MD;  Location: [facility] ENDOSCOPY;  Service: Gastroenterology;  Laterality: N/A;   COLONOSCOPY WITH PROPOFOL  N/A 02/09/2022   Procedure: COLONOSCOPY WITH PROPOFOL ;  Surgeon: Luke Salaam, MD;  Location: [facility] ENDOSCOPY;  Service: Gastroenterology;  Laterality: N/A;   FOOT SURGERY Bilateral    heel spurs   JOINT REPLACEMENT     TOTAL SHOULDER REPLACEMENT Right 01/25/2015   Dr. Daun Epstein   TOTAL THYROIDECTOMY  08/01/2018   TRIGGER FINGER RELEASE Right 06/10/2023   ring finger - Dr. Mozell Arias    Family History  Problem Relation Age of Onset   Heart disease Mother    Heart disease Father    Kidney disease Sister    Hypertension Sister    Diabetes Sister    Hypertension Sister    Kidney failure Sister    Hypertension Brother    CVA Brother    Breast cancer Maternal Aunt 50   Ovarian cancer Neg Hx    Colon cancer Neg Hx     Social History   Tobacco Use   Smoking status: Never   Smokeless tobacco: Never  Substance Use Topics   Alcohol use: No    Alcohol/week: 0.0 standard drinks of alcohol     Current Outpatient Medications:    acetaminophen  (TYLENOL ) 500 MG tablet, Take 500 mg by mouth every 6 (six) hours as needed., Disp: , Rfl:    aspirin 81 MG tablet, Take 81 mg by mouth daily., Disp: , Rfl:    Calcium  Carb-Cholecalciferol (CALCIUM  500+D3 PO), Take 1 tablet by mouth daily., Disp: , Rfl:    Cholecalciferol (VITAMIN D3) 1000 units CAPS, Take 1,000 Units by mouth daily., Disp: , Rfl:    cyanocobalamin  (VITAMIN B12) 1000 MCG tablet, Take 1,000 mcg by mouth daily., Disp: , Rfl:    dapagliflozin  propanediol (FARXIGA ) 10 MG TABS tablet, Take 1 tablet (10 mg total) by mouth daily. Through [employer] Patient Assistance, Disp: 90 tablet, Rfl: 3   Evolocumab  (REPATHA  SURECLICK) 140 MG/ML SOAJ, Inject 140 mg into the skin every 14 (fourteen) days., Disp: 2 mL, Rfl: 2   exemestane  (AROMASIN ) 25 MG tablet, Take 1 tablet (25 mg total) by mouth  daily., Disp: 90 tablet, Rfl: 1   ezetimibe  (ZETIA ) 10 MG tablet, Take 1 tablet (10 mg total) by mouth daily., Disp: 90 tablet, Rfl: 1   glucose blood test strip, , Disp: , Rfl:    levothyroxine  (SYNTHROID ) 125 MCG tablet, TAKE 1 TABLET BY MOUTH DAILY, Disp: 30 tablet, Rfl: 0   Olmesartan -amLODIPine -HCTZ 40-5-25 MG TABS, TAKE 1 TABLET BY MOUTH EVERY DAY AT 12 NOON, Disp: 30 tablet, Rfl: 0   Semaglutide  (RYBELSUS ) 14 MG TABS, Take 1 tablet (14 mg total) by mouth daily at 6 (six) AM. Through [employer] Patient Assistance, Disp: 90 tablet, Rfl: 0   clindamycin (CLEOCIN T) 1 % external solution, Apply 1 Application topically every morning. (Patient not taking: Reported on 01/13/2024), Disp: , Rfl:    diclofenac  Sodium (VOLTAREN ) 1 % GEL, Apply 2 g topically 4 (four) times daily as needed. (Patient not taking: Reported on 01/13/2024), Disp: 100 g, Rfl: 2   tretinoin (RETIN-A) 0.025 %  cream, Apply 1 Application topically daily. (Patient not taking: Reported on 01/13/2024), Disp: , Rfl:   Allergies  Allergen Reactions   Pantoprazole  Other (See Comments)    Affects thyroid     Rosuvastatin  Other (See Comments)    Myalgia     I personally reviewed active problem list, medication list, allergies, family history with the patient/caregiver today.   ROS  Ten systems reviewed and is negative except as mentioned in HPI    Objective Physical Exam  CONSTITUTIONAL: Patient appears well-developed and well-nourished. No distress. HEENT: Head atraumatic, normocephalic, neck supple. CARDIOVASCULAR: Systolic ejection murmur at second intercostal space, right side. Normal rate, regular rhythm and normal heart sounds. No BLE edema. PULMONARY: Effort normal and breath sounds normal. Lungs clear to auscultation. No respiratory distress. ABDOMINAL: There is no tenderness or distention. MUSCULOSKELETAL: Normal gait. Without gross motor or sensory deficit. PSYCHIATRIC: Patient has a normal mood and affect. Behavior  is normal. Judgment and thought content normal. NEUROLOGICAL: Sensation intact in feet. Normal proprioception in feet.   Vitals:   03/18/24 1300  BP: 122/74  Pulse: 95  Resp: 16  SpO2: 98%  Weight: 213 lb 3.2 oz (96.7 kg)  Height: 5\' 2"  (1.575 m)    Body mass index is 38.99 kg/m.  Recent Results (from the past 2160 hours)  HM DIABETES EYE EXAM     Status: None   Collection Time: 01/19/24 12:00 AM  Result Value Ref Range   HM Diabetic Eye Exam No Retinopathy No Retinopathy    Comment: Signify health  Microalbumin / creatinine urine ratio     Status: None   Collection Time: 01/19/24 12:00 AM  Result Value Ref Range   Microalb Creat Ratio normal   Lab report - scanned     Status: None   Collection Time: 01/19/24  3:05 PM  Result Value Ref Range   EGFR (African American) 60     Comment: Abstracted by HIM   Alb/Creat Ratio, Ur 30.00 mg/g Creat  Fecal occult blood, imunochemical     Status: None   Collection Time: 02/03/24 12:00 AM  Result Value Ref Range   IFOBT Negative     Comment: Signify Health  CMP (Cancer Center only)     Status: Abnormal   Collection Time: 03/09/24 10:21 AM  Result Value Ref Range   Sodium 138 135 - 145 mmol/L   Potassium 3.6 3.5 - 5.1 mmol/L   Chloride 103 98 - 111 mmol/L   CO2 25 22 - 32 mmol/L   Glucose, Bld 153 (H) 70 - 99 mg/dL    Comment: Glucose reference range applies only to samples taken after fasting for at least 8 hours.   BUN 24 (H) 8 - 23 mg/dL   Creatinine 9.81 (H) 1.91 - 1.00 mg/dL   Calcium  9.1 8.9 - 10.3 mg/dL   Total Protein 7.1 6.5 - 8.1 g/dL   Albumin 3.7 3.5 - 5.0 g/dL   AST 24 15 - 41 U/L   ALT 22 0 - 44 U/L   Alkaline Phosphatase 51 38 - 126 U/L   Total Bilirubin 0.3 0.0 - 1.2 mg/dL   GFR, Estimated 44 (L) >60 mL/min    Comment: (NOTE) Calculated using the CKD-EPI Creatinine Equation (2021)    Anion gap 10 5 - 15    Comment: Performed at [facility], [address] [place], [place] [address]  CBC with  Differential (Cancer Center Only)     Status: None   Collection Time: 03/09/24  10:21 AM  Result Value Ref Range   WBC Count 4.7 4.0 - 10.5 K/uL   RBC 5.06 3.87 - 5.11 MIL/uL   Hemoglobin 13.6 12.0 - 15.0 g/dL   HCT 40.9 81.1 - 91.4 %   MCV 83.4 80.0 - 100.0 fL   MCH 26.9 26.0 - 34.0 pg   MCHC 32.2 30.0 - 36.0 g/dL   RDW 78.2 95.6 - 21.3 %   Platelet Count 322 150 - 400 K/uL   nRBC 0.0 0.0 - 0.2 %   Neutrophils Relative % 53 %   Neutro Abs 2.5 1.7 - 7.7 K/uL   Lymphocytes Relative 38 %   Lymphs Abs 1.8 0.7 - 4.0 K/uL   Monocytes Relative 6 %   Monocytes Absolute 0.3 0.1 - 1.0 K/uL   Eosinophils Relative 2 %   Eosinophils Absolute 0.1 0.0 - 0.5 K/uL   Basophils Relative 1 %   Basophils Absolute 0.0 0.0 - 0.1 K/uL   Immature Granulocytes 0 %   Abs Immature Granulocytes 0.02 0.00 - 0.07 K/uL    Comment: Performed at [facility], [address] [place], [place] [address]  POCT glycosylated hemoglobin (Hb A1C)     Status: Abnormal   Collection Time: 03/18/24  1:05 PM  Result Value Ref Range   Hemoglobin A1C 6.5 (A) 4.0 - 5.6 %   HbA1c POC (<> result, manual entry)     HbA1c, POC (prediabetic range)     HbA1c, POC (controlled diabetic range)      Diabetic Foot Exam:  Diabetic foot exam was performed with the following findings:   No deformities, ulcerations, or other skin breakdown Normal sensation of 10g monofilament Intact posterior tibialis and dorsalis pedis pulses      PHQ2/9:    03/18/2024    1:00 PM 02/21/2024    9:11 AM 01/15/2024    8:05 AM 11/15/2023    3:18 PM 07/29/2023    8:48 AM  Depression screen PHQ 2/9  Decreased Interest 0 0 0 0 1  Down, Depressed, Hopeless 0 0 0 0 1  PHQ - 2 Score 0 0 0 0 2  Altered sleeping 0 1 0 0 1  Tired, decreased energy 0 1 0 0 1  Change in appetite 0 1 0 0 1  Feeling bad or failure about yourself  0 0 0 0 0  Trouble concentrating 0 0 0 0 0  Moving slowly or fidgety/restless 0 0 0 0 1  Suicidal thoughts 0 0 0 0 0   PHQ-9 Score 0 3 0 0 6  Difficult doing work/chores Not difficult at all Not difficult at all Not difficult at all Not difficult at all Somewhat difficult    phq 9 is negative  Fall Risk:    03/18/2024   12:54 PM 02/21/2024    9:09 AM 01/15/2024    8:05 AM 11/15/2023    3:09 PM 07/29/2023    8:48 AM  Fall Risk   Falls in the past year? 0 0 0 0 0  Number falls in past yr: 0 0 0 0   Injury with Fall? 0 0 0 0   Risk for fall due to : No Fall Risks No Fall Risks No Fall Risks No Fall Risks No Fall Risks  Follow up Falls prevention discussed;Education provided;Falls evaluation completed Falls evaluation completed Falls prevention discussed;Education provided;Falls evaluation completed Falls prevention discussed;Education provided;Falls evaluation completed Falls prevention discussed    Assessment & Plan Type 2 diabetes mellitus with  chronic kidney disease Type 2 diabetes mellitus with chronic kidney disease stage 3A. A1c improved to 6.5%, indicating good glycemic control. Chronic kidney disease stage 3A with GFR of 44.  - Continue Farxiga  and Rybelsus . - Continue ARB and SGL-2 agonist for kidney protection - Ensure refills through assistance programs. - Collect urine sample for protein evaluation today.  Diabetic neuropathy Diabetic neuropathy present but not severe. She prefers no medication for neuropathy.  Hypertensive heart disease without heart failure Blood pressure controlled at 122/74 mmHg. Left ventricular hypertrophy present. - Continue Tribenzor.  Dyslipidemia Dyslipidemia managed with Repatha  and Zetia .  - Continue Repatha  and Zetia . - Check cholesterol levels in August. - Statin myopathy  Morbid Obesity Obesity with BMI over 35. Engaging in regular physical activity for weight management. - Encourage continued physical activity and weight management efforts.  Pulmonary hypertension Pulmonary hypertension likely secondary to sleep apnea. She does not use  CPAP.  Hypothyroidism post-thyroidectomy Hypothyroidism managed with levothyroxine . TSH not checked recently. - [facility] - Order TSH test.  Right breast cancer Right breast cancer on Aromasin  with side effects. Therapy to continue for 5-7 years. - Continue Aromasin .

## 2024-03-19 ENCOUNTER — Encounter: Payer: Self-pay | Admitting: Family Medicine

## 2024-03-19 LAB — MICROALBUMIN / CREATININE URINE RATIO
Creatinine, Urine: 210 mg/dL (ref 20–275)
Microalb Creat Ratio: 10 mg/g{creat} (ref <30)
Microalb, Ur: 2 mg/dL

## 2024-03-19 LAB — TSH: TSH: 0.54 m[IU]/L (ref 0.40–4.50)

## 2024-03-23 ENCOUNTER — Other Ambulatory Visit: Payer: Self-pay | Admitting: Family Medicine

## 2024-03-23 DIAGNOSIS — E890 Postprocedural hypothyroidism: Secondary | ICD-10-CM

## 2024-04-03 ENCOUNTER — Other Ambulatory Visit: Payer: Self-pay | Admitting: Pharmacist

## 2024-04-03 DIAGNOSIS — E1169 Type 2 diabetes mellitus with other specified complication: Secondary | ICD-10-CM

## 2024-04-03 NOTE — Patient Instructions (Signed)
 Goals Addressed             This Visit's Progress    Pharmacy Goals       If you need to reach out to patient assistance programs regarding refills or to find out the status of your application, you can do so by calling:   [employer] at [phone] [employer] at [phone]   Our goal A1c is less than 7%. This corresponds with fasting sugars less than 130 and 2 hour after meal sugars less than 180. Please keep a log of your results when checking your blood sugar    Check your blood pressure once weekly, and any time you have concerning symptoms like headache, chest pain, dizziness, shortness of breath, or vision changes.   Our goal is less than 130/80.  To appropriately check your blood pressure, make sure you do the following:  1) Avoid caffeine, exercise, or tobacco products for 30 minutes before checking. Empty your bladder. 2) Sit with your back supported in a flat-backed chair. Rest your arm on something flat (arm of the chair, table, etc). 3) Sit still with your feet flat on the floor, resting, for at least 5 minutes.  4) Check your blood pressure. Take 1-2 readings.  5) Write down these readings and bring with you to any provider appointments.  Bring your home blood pressure machine with you to a provider's office for accuracy comparison at least once a year.   Make sure you take your blood pressure medications before you come to any office visit, even if you were asked to fast for labs.  Thank you!   Estelle Grumbles, PharmD, [facility] [phone]

## 2024-04-03 NOTE — Progress Notes (Signed)
   04/03/2024  Patient ID: Andrea Randolph, female   DOB: Sep 12, 1956, 68 y.o.   MRN: [id number]  Receive a call from patient requesting help with medication access.  Reports [facility] advising that her levothyroxine  is no longer covered by her insurance. Also shares that she has been paying >$45 forezetimibe .   From review of [employer] Medicare website, levothyroxine  is covered as a tier 1 medication and ezetimibe  is covered as a tier 2 medication through patient's plan. Per summary of benefits patient's cost for tier 1 should be $0/month and tier 2 is $5/month through preferred pharmacies.  [facility]. [facility] with [facility] who reruns both prescriptions through patient's [facility] and confirms both medications covered as above - Return call to patient to provide an update  Patient also asks about cost of her exemestane . Per formulary, this medication is a tier 4 option through patient's plan with co-insurance of 35% for tier 4 medications. Note formulary alternatives for aromatase Inhibitor therapy includes anastrozole  or letrozole (both tier 2 options).  Patient reports that she has been receiving exemestane  for a lower cost using savings card (~$50 per 90 day supply), but that it is still difficult to afford From review of chart, per Office Visit note from Dr. Wilhelmenia Harada on 05/30/2021, note patient previously on anastrozole  in 2022, but then switched to exemestane  due to leg, back and neck pain. Patient advised may switch to letrozole, if still not able to tolerate.  Patient states that she will follow up with Dr. Wilhelmenia Harada to discuss cost/benefit of potential therapy change as cost of exemestane  is difficult for her to continue to afford  Patient denies further medication related questions/concerns today.   Follow Up Plan: Clinical Pharmacist will follow up with patient by telephone on 04/29/2024 at 2:30 PM   Arthur Lash, PharmD, [facility] [phone]

## 2024-04-22 DIAGNOSIS — E119 Type 2 diabetes mellitus without complications: Secondary | ICD-10-CM | POA: Diagnosis not present

## 2024-04-22 DIAGNOSIS — H2513 Age-related nuclear cataract, bilateral: Secondary | ICD-10-CM | POA: Diagnosis not present

## 2024-04-22 LAB — HM DIABETES EYE EXAM

## 2024-04-29 ENCOUNTER — Other Ambulatory Visit: Payer: Self-pay | Admitting: Pharmacist

## 2024-04-29 DIAGNOSIS — E785 Hyperlipidemia, unspecified: Secondary | ICD-10-CM

## 2024-04-29 DIAGNOSIS — E1122 Type 2 diabetes mellitus with diabetic chronic kidney disease: Secondary | ICD-10-CM

## 2024-04-29 DIAGNOSIS — I10 Essential (primary) hypertension: Secondary | ICD-10-CM

## 2024-04-29 NOTE — Progress Notes (Signed)
 04/29/2024 Name: Andrea Randolph MRN: [id number] DOB: 1956-02-02  Chief Complaint  Patient presents with   Medication Management    Andrea Randolph is a 68 y.o. year old female who presented for a telephone visit.   They were referred to the pharmacist by their PCP for assistance in managing medication access.      Subjective:   Care Team: Primary Care Provider: Sowles, Krichna, MD ; Next Scheduled Visit: 07/20/2024 Oncology: Timmy Forbes, MD Rheumatology: Melissa Spring, MD  Neurosurgery: Jodeen Munch, MD Cardiologist: Belva Boyden, MD  Medication Access/Adherence  [phone] [address], [place] - [address] [place] [address] Phone: [phone] Fax: [phone]  [facility] - [place], [place] - [address] [place] [address] Phone: [phone] Fax: [phone]   Patient reports affordability concerns with their medications: No  Patient reports access/transportation concerns to their pharmacy: No  Patient reports adherence concerns with their medications:  No       Diabetes:   Current medications:  - Farxiga  10 mg daily - Rybelsus  14 mg daily Confirms takes on an empty stomach, >=30 minutes before the first food, beverage, or other oral medications of the day with <=4 oz of plain water only   Denies checking home blood sugar recently   Patient denies hypoglycemic s/sx including dizziness, shakiness, sweating.    Current medication access support:  - Enrolled in patient assistance for Farxiga  from [employer] through 11/11/2024 - Enrolled in patient assistance for Rybelsus  from [employer] through 11/11/2024     Hypertension:   Current medications: olmesartan -amlodipine -HCTZ 40-5-25 mg daily   Patient has an automated, upper arm home BP cuff  Reports checked a couple of days ago and reading was good, but does not recall specific reading   Patient denies hypotensive s/sx including dizziness,  lightheadedness.      Hyperlipidemia/ASCVD Risk Reduction   Current lipid lowering medications:  - Repatha  140 mg injection every 14 days - ezetimibe  10 mg daily     Antiplatelet regimen: aspirin 81 mg   ASCVD History: Per review of Cardiology notes, Cardiac CTA in 2023 showed mild nonobstructive CAD    Current medication access support: Enrolled in [facility] for Repatha  through 02/28/2025        Objective:  Lab Results  Component Value Date   HGBA1C 6.5 (A) 03/18/2024    Lab Results  Component Value Date   CREATININE 1.33 (H) 03/09/2024   BUN 24 (H) 03/09/2024   NA 138 03/09/2024   K 3.6 03/09/2024   CL 103 03/09/2024   CO2 25 03/09/2024    Lab Results  Component Value Date   CHOL 156 07/11/2023   HDL 55 07/11/2023   LDLCALC 85 07/11/2023   TRIG 72 07/11/2023   CHOLHDL 2.8 07/11/2023   BP Readings from Last 3 Encounters:  03/18/24 122/74  03/09/24 (!) 147/84  02/21/24 126/74   Pulse Readings from Last 3 Encounters:  03/18/24 95  03/09/24 72  01/15/24 99     Medications Reviewed Today     Reviewed by Ardis Becton, RPH-CPP (Pharmacist) on 04/29/24 at 1456  Med List Status: <None>   Medication Order Taking? Sig Documenting Provider Last Dose Status Informant  acetaminophen  (TYLENOL ) 500 MG tablet [id number]  Take 500 mg by mouth every 6 (six) hours as needed. [provider]  Active   aspirin 81 MG tablet [id number]  Take 81 mg by mouth daily. [provider]  Active Self, Pharmacy Records, Multiple Informants, Other           Med Note Alford Angus Jun 27, 2020  3:41 PM)    Calcium  Carb-Cholecalciferol (CALCIUM  500+D3 PO) [id number]  Take 1 tablet by mouth daily. [provider]  Active Pharmacy Records, Multiple Informants, Self, Other  Cholecalciferol (VITAMIN D3) 1000 units CAPS [id number]  Take 1,000 Units by mouth daily. [provider]  Active Self, Pharmacy Records, Multiple  Informants, Other  clindamycin (CLEOCIN T) 1 % external solution [id number]  Apply 1 Application topically every morning.  Patient not taking: Reported on 01/13/2024   [provider]  Active   cyanocobalamin  (VITAMIN B12) 1000 MCG tablet [id number]  Take 1,000 mcg by mouth daily. [provider]  Active   dapagliflozin  propanediol (FARXIGA ) 10 MG TABS tablet [id number] Yes Take 1 tablet (10 mg total) by mouth daily. Through [employer] Patient Assistance Sowles, Krichna, MD  Active   diclofenac  Sodium (VOLTAREN ) 1 % GEL [id number]  Apply 2 g topically 4 (four) times daily as needed.  Patient not taking: Reported on 01/13/2024   Sowles, Krichna, MD  Active Pharmacy Records, Multiple Informants, Self, Other  Evolocumab  (REPATHA  SURECLICK) 140 MG/ML [place] [id number] Yes Inject 140 mg into the skin every 14 (fourteen) days. Sowles, Krichna, MD  Active   exemestane  (AROMASIN ) 25 MG tablet [id number]  Take 1 tablet (25 mg total) by mouth daily. Timmy Forbes, MD  Active   ezetimibe  (ZETIA ) 10 MG tablet [id number]  Take 1 tablet (10 mg total) by mouth daily. Sowles, Krichna, MD  Active   glucose blood test strip [id number]   [provider]  Active Self, Pharmacy Records, Multiple Informants, Other           Med Note Alford Angus Jun 27, 2020  3:42 PM)    levothyroxine  (SYNTHROID ) 125 MCG tablet [id number]  Take 1 tablet (125 mcg total) by mouth daily. Sowles, Krichna, MD  Active   Olmesartan -amLODIPine -HCTZ 40-5-25 MG TABS [id number]  Take 1 tablet by mouth daily with breakfast. Sowles, Krichna, MD  Active   Semaglutide  (RYBELSUS ) 14 MG TABS [id number] Yes Take 1 tablet (14 mg total) by mouth daily at 6 (six) AM. Through [employer] Patient Assistance [place], Krichna, MD  Active   tretinoin (RETIN-A) 0.025 % cream [id number]  Apply 1 Application topically daily.  Patient not taking: Reported on 01/13/2024   [provider]  Active               Assessment/Plan:    Diabetes: - Currently controlled - Reviewed long term cardiovascular and renal outcomes of uncontrolled blood sugar - Patient to have regular well-balanced meals and snacks throughout the day, while controlling carbohydrate portion sizes - Patient to follow up with [employer] as needed for refills of Rybelsus  and with [employer] as needed for refills of Farxiga      Hypertension: - Recommend to monitor home blood pressure, keep log of results and have this record to review at upcoming medical appointments. Patient to contact provider office sooner if needed for readings outside of established parameters or symptoms     Hyperlipidemia/ASCVD Risk Reduction: - Recommend patient to continue Repatha  and ezetimibe  as prescribed     Follow Up Plan: Clinical Pharmacist will follow up with patient by telephone on 09/16/2024 at 2:00 PM    Arthur Lash, PharmD, [facility] [phone]

## 2024-04-29 NOTE — Patient Instructions (Signed)
 Goals Addressed             This Visit's Progress    Pharmacy Goals       If you need to reach out to patient assistance programs regarding refills or to find out the status of your application, you can do so by calling:   [employer] at [phone] [employer] at [phone]   Our goal A1c is less than 7%. This corresponds with fasting sugars less than 130 and 2 hour after meal sugars less than 180. Please keep a log of your results when checking your blood sugar    Check your blood pressure once weekly, and any time you have concerning symptoms like headache, chest pain, dizziness, shortness of breath, or vision changes.   Our goal is less than 130/80.  To appropriately check your blood pressure, make sure you do the following:  1) Avoid caffeine, exercise, or tobacco products for 30 minutes before checking. Empty your bladder. 2) Sit with your back supported in a flat-backed chair. Rest your arm on something flat (arm of the chair, table, etc). 3) Sit still with your feet flat on the floor, resting, for at least 5 minutes.  4) Check your blood pressure. Take 1-2 readings.  5) Write down these readings and bring with you to any provider appointments.  Bring your home blood pressure machine with you to a provider's office for accuracy comparison at least once a year.   Make sure you take your blood pressure medications before you come to any office visit, even if you were asked to fast for labs.  Thank you!   Estelle Grumbles, PharmD, [facility] [phone]

## 2024-05-01 ENCOUNTER — Other Ambulatory Visit: Payer: Self-pay | Admitting: Family Medicine

## 2024-05-07 ENCOUNTER — Encounter: Payer: Self-pay | Admitting: Radiation Oncology

## 2024-05-07 ENCOUNTER — Ambulatory Visit
Admission: RE | Admit: 2024-05-07 | Discharge: 2024-05-07 | Disposition: A | Discharge status: Home / Self Care | Payer: Medicare HMO | Source: Ambulatory Visit | Attending: Radiation Oncology | Admitting: Radiation Oncology

## 2024-05-07 VITALS — BP 129/81 | HR 69 | Temp 97.6°F | Resp 20 | Wt 215.0 lb

## 2024-05-07 DIAGNOSIS — Z170 Estrogen receptor positive status [ER+]: Secondary | ICD-10-CM | POA: Insufficient documentation

## 2024-05-07 DIAGNOSIS — Z79811 Long term (current) use of aromatase inhibitors: Secondary | ICD-10-CM | POA: Insufficient documentation

## 2024-05-07 DIAGNOSIS — C50211 Malignant neoplasm of upper-inner quadrant of right female breast: Secondary | ICD-10-CM | POA: Insufficient documentation

## 2024-05-07 DIAGNOSIS — Z923 Personal history of irradiation: Secondary | ICD-10-CM | POA: Insufficient documentation

## 2024-05-07 NOTE — Progress Notes (Signed)
 Radiation Oncology Follow up Note  Name: Andrea Randolph   Date:   05/07/2024 MRN:  [id number] DOB: 1956-06-04    This 68 y.o. female presents to the clinic today for 3-year follow-up status post whole breast radiation to her right breast for stage Ia ER/PR positive invasive mammary carcinoma.  REFERRING PROVIDER: Sowles, Krichna, MD  HPI: Patient is a 68 year old female now out over 3 years having completed whole breast radiation to her right breast for stage Ia ER positive invasive mammary carcinoma seen today in routine follow-up she is doing well.  She specifically [place] breast tenderness cough or bone pain..  She had mammograms back in March which was BI-RADS 2 benign.  She is currently on Aromasin  tolerating that well without side effect.  COMPLICATIONS OF TREATMENT: none  FOLLOW UP COMPLIANCE: keeps appointments   PHYSICAL EXAM:  BP 129/81   Pulse 69   Temp 97.6 F (36.4 C) (Tympanic)   Resp 20   Wt 215 lb (97.5 kg)   BMI 39.32 kg/m  Lungs are clear to A&P cardiac examination essentially unremarkable with regular rate and rhythm. No dominant mass or nodularity is noted in either breast in 2 positions examined. Incision is well-healed. No axillary or supraclavicular adenopathy is appreciated. Cosmetic result is excellent.  Well-developed well-nourished patient in NAD. HEENT reveals PERLA, EOMI, discs not visualized.  Oral cavity is clear. No oral mucosal lesions are identified. Neck is clear without evidence of cervical or supraclavicular adenopathy. Lungs are clear to A&P. Cardiac examination is essentially unremarkable with regular rate and rhythm without murmur rub or thrill. Abdomen is benign with no organomegaly or masses noted. Motor sensory and DTR levels are equal and symmetric in the upper and lower extremities. Cranial nerves II through XII are grossly intact. Proprioception is intact. No peripheral adenopathy or edema is identified. No motor or sensory levels are noted. Crude  visual fields are within normal range.  RADIOLOGY RESULTS: Mammograms reviewed compatible with above-stated findings  PLAN: At the present time patient is doing well with no evidence of disease I am pleased with her overall progress.  She is now out over 3 years I will turn follow-up care over to medical oncology.  I be happy to reevaluate the patient anytime should that be indicated.  She continues on Aromasin  without side effect.  Patient knows to call with any concerns.  I would like to take this opportunity to thank you for allowing me to participate in the care of your patient.[name] Marcey Penton, MD

## 2024-05-18 ENCOUNTER — Other Ambulatory Visit: Payer: Self-pay | Admitting: Pharmacist

## 2024-05-18 ENCOUNTER — Encounter: Payer: Self-pay | Admitting: Pharmacist

## 2024-05-18 DIAGNOSIS — E1169 Type 2 diabetes mellitus with other specified complication: Secondary | ICD-10-CM

## 2024-05-18 NOTE — Progress Notes (Signed)
   05/18/2024  Patient ID: Ronal KANDICE Ada, female   DOB: 1956/03/18, 68 y.o.   MRN: [id number]  Receive voicemail from patient requesting a call back.  Return call to patient.  Reports was unable to pick up her Repatha  injection last week as pharmacy advised that her [employer] grant was not working. - Follow up with [facility] today and find that claim was being processed through previous [employer] grant. Provide billing information for latest [employer] grant for Enrollment Period: 03/01/2024 through 02/28/2025  GLENWOOD Ards with [facility] processes claim through both patient's [employer] Medicare and [employer] grant and confirms patient's cost is $0.  Update provided to patient.  Sharyle Sia, PharmD, [facility] [phone]

## 2024-05-18 NOTE — Patient Instructions (Signed)
 Goals Addressed             This Visit's Progress    Pharmacy Goals       If you need to reach out to patient assistance programs regarding refills or to find out the status of your application, you can do so by calling:   [employer] at [phone] [employer] at [phone]   Our goal A1c is less than 7%. This corresponds with fasting sugars less than 130 and 2 hour after meal sugars less than 180. Please keep a log of your results when checking your blood sugar    Check your blood pressure once weekly, and any time you have concerning symptoms like headache, chest pain, dizziness, shortness of breath, or vision changes.   Our goal is less than 130/80.  To appropriately check your blood pressure, make sure you do the following:  1) Avoid caffeine, exercise, or tobacco products for 30 minutes before checking. Empty your bladder. 2) Sit with your back supported in a flat-backed chair. Rest your arm on something flat (arm of the chair, table, etc). 3) Sit still with your feet flat on the floor, resting, for at least 5 minutes.  4) Check your blood pressure. Take 1-2 readings.  5) Write down these readings and bring with you to any provider appointments.  Bring your home blood pressure machine with you to a provider's office for accuracy comparison at least once a year.   Make sure you take your blood pressure medications before you come to any office visit, even if you were asked to fast for labs.  Thank you!   Estelle Grumbles, PharmD, [facility] [phone]

## 2024-06-30 ENCOUNTER — Other Ambulatory Visit: Payer: Self-pay | Admitting: Family Medicine

## 2024-06-30 DIAGNOSIS — E890 Postprocedural hypothyroidism: Secondary | ICD-10-CM

## 2024-07-20 ENCOUNTER — Encounter: Payer: Self-pay | Admitting: Family Medicine

## 2024-07-20 ENCOUNTER — Ambulatory Visit: Admitting: Family Medicine

## 2024-07-20 VITALS — BP 124/76 | HR 87 | Resp 16 | Ht 62.0 in | Wt 216.6 lb

## 2024-07-20 DIAGNOSIS — E1169 Type 2 diabetes mellitus with other specified complication: Secondary | ICD-10-CM

## 2024-07-20 DIAGNOSIS — G720 Drug-induced myopathy: Secondary | ICD-10-CM

## 2024-07-20 DIAGNOSIS — E538 Deficiency of other specified B group vitamins: Secondary | ICD-10-CM

## 2024-07-20 DIAGNOSIS — I119 Hypertensive heart disease without heart failure: Secondary | ICD-10-CM

## 2024-07-20 DIAGNOSIS — C50411 Malignant neoplasm of upper-outer quadrant of right female breast: Secondary | ICD-10-CM

## 2024-07-20 DIAGNOSIS — G4733 Obstructive sleep apnea (adult) (pediatric): Secondary | ICD-10-CM

## 2024-07-20 DIAGNOSIS — E1122 Type 2 diabetes mellitus with diabetic chronic kidney disease: Secondary | ICD-10-CM | POA: Diagnosis not present

## 2024-07-20 DIAGNOSIS — E785 Hyperlipidemia, unspecified: Secondary | ICD-10-CM | POA: Diagnosis not present

## 2024-07-20 DIAGNOSIS — E890 Postprocedural hypothyroidism: Secondary | ICD-10-CM | POA: Diagnosis not present

## 2024-07-20 DIAGNOSIS — I272 Pulmonary hypertension, unspecified: Secondary | ICD-10-CM

## 2024-07-20 DIAGNOSIS — N1831 Chronic kidney disease, stage 3a: Secondary | ICD-10-CM | POA: Diagnosis not present

## 2024-07-20 DIAGNOSIS — N3941 Urge incontinence: Secondary | ICD-10-CM

## 2024-07-20 LAB — POCT GLYCOSYLATED HEMOGLOBIN (HGB A1C): Hemoglobin A1C: 6.3 % — AB (ref 4.0–5.6)

## 2024-07-20 MED ORDER — OLMESARTAN-AMLODIPINE-HCTZ 40-5-25 MG PO TABS: 1.0000 | ORAL_TABLET | Freq: Every day | ORAL | 1 refills | Status: AC

## 2024-07-20 MED ORDER — EZETIMIBE 10 MG PO TABS: 10.0000 mg | ORAL_TABLET | Freq: Every day | ORAL | 1 refills | Status: AC

## 2024-07-20 NOTE — Progress Notes (Signed)
 Name: Andrea Randolph   MRN: [id number]    DOB: Sep 01, 1956   Date:07/20/2024       Progress Note  Subjective  Chief Complaint  Chief Complaint  Patient presents with   Medical Management of Chronic Issues   Discussed the use of AI scribe software for clinical note transcription with the patient, who gave verbal consent to proceed.  History of Present Illness Andrea Randolph is a 68 year old female with type 2 diabetes and chronic kidney disease who presents with urinary urgency and incontinence.  She has been experiencing urinary urgency and incontinence for over a year, characterized by an immediate need to urinate upon feeling the urge, leading to leakage if not addressed promptly. She is not currently taking any medications for incontinence and has not undergone any recent work-up for a urinary tract infection, including urine culture or urinalysis.  Her type 2 diabetes is managed with Farxiga  and Rybelsus  14 mg daily. Her recent A1c was 6.3, with previous values of 7.1 in January and 6.5 in May. She reports increased physical activity, including regular visits to the gym and [employer].  She has chronic kidney disease stage 3A, which remains stable with a GFR of 44 in April. She takes Farxiga  for her diabetes and kidney disease.  She has a history of obstructive sleep apnea but does not use CPA ( unable to tolerate it) . She also has mild pulmonary hypertension and hypertensive left ventricular hypertrophy.  Her history of breast cancer was treated with surgery and radiation in 2022. She was on Aromasin  for suppressive therapy but has been inconsistent due to cost and side effects like stiffness. She has not yet discussed this with her oncologist.  She has post-surgical hypothyroidism and takes thyroid  medication, which she recently refilled. No symptoms such as constipation, palpitations, or dry skin.  She has dyslipidemia and is on ezetimibe  and Repatha  injections due to statin intolerance.  She  is actively working on weight loss through diet and exercise, enjoying activities at the gym and [employer].    Patient Active Problem List   Diagnosis Date Noted   Diabetes mellitus with microalbuminuria (HCC) 11/15/2023   Chronic bilateral low back pain with left-sided sciatica 07/11/2023   Genetic testing 03/28/2022   Stage 3a chronic kidney disease (CKD) (HCC) 03/07/2022   Aromatase inhibitor use 05/30/2021   Malignant 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, estrogen receptor positive (HCC) 02/20/2021   Carpal tunnel syndrome, bilateral 02/19/2019   Trigger finger of right thumb 02/19/2019   Flexor tenosynovitis of finger 02/19/2019   Mild concentric left ventricular hypertrophy (LVH) 12/24/2017   Mild aortic stenosis by prior echocardiogram 12/24/2017   Mild aortic regurgitation 12/24/2017   Uterine fibroid 01/11/2016   Allergic rhinitis, seasonal 05/24/2015   Benign essential HTN 05/24/2015   Chronic constipation 05/24/2015   Dyslipidemia 05/24/2015   Post-surgical hypothyroidism 05/24/2015   Mixed incontinence 05/24/2015   Microalbuminuria 05/24/2015   Menopause 05/24/2015   Obstructive apnea 05/24/2015   Morbid obesity (HCC) 05/24/2015   Hypertensive retinopathy 05/24/2015   Vitamin D  deficiency 05/24/2015   Status post total replacement of right shoulder 03/11/2015    Past Surgical History:  Procedure Laterality Date   BREAST BIOPSY Left 90's   benign core needle    BREAST BIOPSY Right 02/07/2021   US  Bx, Q-clip, DCIS   BREAST LUMPECTOMY Right 03/06/2021   BREAST LUMPECTOMY WITH SENTINEL LYMPH NODE BIOPSY Right 03/06/2021   Procedure: BREAST LUMPECTOMY WITH SENTINEL  LYMPH NODE BX;  Surgeon: Dessa Reyes ORN, MD;  Location: [facility] ORS;  Service: General;  Laterality: Right;   COLONOSCOPY WITH PROPOFOL  N/A 01/26/2019   Procedure: COLONOSCOPY WITH PROPOFOL ;  Surgeon: Unk Corinn Skiff, MD;  Location: [facility] ENDOSCOPY;  Service: Gastroenterology;   Laterality: N/A;   COLONOSCOPY WITH PROPOFOL  N/A 02/09/2022   Procedure: COLONOSCOPY WITH PROPOFOL ;  Surgeon: Therisa Bi, MD;  Location: [facility] ENDOSCOPY;  Service: Gastroenterology;  Laterality: N/A;   FOOT SURGERY Bilateral    heel spurs   JOINT REPLACEMENT     TOTAL SHOULDER REPLACEMENT Right 01/25/2015   Dr. Edie   TOTAL THYROIDECTOMY  08/01/2018   TRIGGER FINGER RELEASE Right 06/10/2023   ring finger - Dr. Kathlynn    Family History  Problem Relation Age of Onset   Heart disease Mother    Heart disease Father    Kidney disease Sister    Hypertension Sister    Diabetes Sister    Hypertension Sister    Kidney failure Sister    Hypertension Brother    CVA Brother    Breast cancer Maternal Aunt 50   Ovarian cancer Neg Hx    Colon cancer Neg Hx     Social History   Tobacco Use   Smoking status: Never   Smokeless tobacco: Never  Substance Use Topics   Alcohol use: No    Alcohol/week: 0.0 standard drinks of alcohol     Current Outpatient Medications:    acetaminophen  (TYLENOL ) 500 MG tablet, Take 500 mg by mouth every 6 (six) hours as needed., Disp: , Rfl:    aspirin 81 MG tablet, Take 81 mg by mouth daily., Disp: , Rfl:    Calcium  Carb-Cholecalciferol (CALCIUM  500+D3 PO), Take 1 tablet by mouth daily., Disp: , Rfl:    Cholecalciferol (VITAMIN D3) 1000 units CAPS, Take 1,000 Units by mouth daily., Disp: , Rfl:    cyanocobalamin  (VITAMIN B12) 1000 MCG tablet, Take 1,000 mcg by mouth daily., Disp: , Rfl:    dapagliflozin  propanediol (FARXIGA ) 10 MG TABS tablet, Take 1 tablet (10 mg total) by mouth daily. Through [employer] Patient Assistance, Disp: 90 tablet, Rfl: 3   diclofenac  Sodium (VOLTAREN ) 1 % GEL, Apply 2 g topically 4 (four) times daily as needed., Disp: 100 g, Rfl: 2   Evolocumab  (REPATHA  SURECLICK) 140 MG/ML SOAJ, INJECT 140 MG INTO THE SKIN EVERY 14 (FOURTEEN) DAYS., Disp: 2 mL, Rfl: 2   exemestane  (AROMASIN ) 25 MG tablet, Take 1 tablet (25 mg total) by mouth daily.,  Disp: 90 tablet, Rfl: 1   ezetimibe  (ZETIA ) 10 MG tablet, Take 1 tablet (10 mg total) by mouth daily., Disp: 90 tablet, Rfl: 1   glucose blood test strip, , Disp: , Rfl:    levothyroxine  (SYNTHROID ) 125 MCG tablet, TAKE 1 TABLET BY MOUTH EVERY DAY, Disp: 30 tablet, Rfl: 0   Olmesartan -amLODIPine -HCTZ 40-5-25 MG TABS, Take 1 tablet by mouth daily with breakfast., Disp: 90 tablet, Rfl: 1   Semaglutide  (RYBELSUS ) 14 MG TABS, Take 1 tablet (14 mg total) by mouth daily at 6 (six) AM. Through [employer] Patient Assistance, Disp: 90 tablet, Rfl: 0   tretinoin (RETIN-A) 0.025 % cream, Apply 1 Application topically daily., Disp: , Rfl:    clindamycin (CLEOCIN T) 1 % external solution, Apply 1 Application topically every morning. (Patient not taking: Reported on 07/20/2024), Disp: , Rfl:   Allergies  Allergen Reactions   Pantoprazole  Other (See Comments)    Affects thyroid     Rosuvastatin  Other (See Comments)  Myalgia     I personally reviewed active problem list, medication list, allergies, family history with the patient/caregiver today.   ROS  Ten systems reviewed and is negative except as mentioned in HPI    Objective Physical Exam CONSTITUTIONAL: Patient appears well-developed and well-nourished.  No distress. HEENT: Head atraumatic, normocephalic, neck supple. CARDIOVASCULAR: Normal rate, regular rhythm and normal heart sounds.  No murmur heard. No BLE edema. PULMONARY: Effort normal and breath sounds normal. No respiratory distress. MUSCULOSKELETAL: Normal gait. Without gross motor or sensory deficit. PSYCHIATRIC: Patient has a normal mood and affect. behavior is normal. Judgment and thought content normal.  Vitals:   07/20/24 0946  BP: 124/76  Pulse: 87  Resp: 16  SpO2: 99%  Weight: 216 lb 9.6 oz (98.2 kg)  Height: 5' 2 (1.575 m)    Body mass index is 39.62 kg/m.  Recent Results (from the past 2160 hours)  HM DIABETES EYE EXAM     Status: None   Collection Time:  04/22/24  4:57 PM  Result Value Ref Range   HM Diabetic Eye Exam No Retinopathy No Retinopathy    Comment: Abstracted by HIM  POCT glycosylated hemoglobin (Hb A1C)     Status: Abnormal   Collection Time: 07/20/24  9:51 AM  Result Value Ref Range   Hemoglobin A1C 6.3 (A) 4.0 - 5.6 %   HbA1c POC (<> result, manual entry)     HbA1c, POC (prediabetic range)     HbA1c, POC (controlled diabetic range)      Diabetic Foot Exam:     PHQ2/9:    07/20/2024    9:39 AM 05/07/2024    9:00 AM 03/18/2024    1:00 PM 02/21/2024    9:11 AM 01/15/2024    8:05 AM  Depression screen PHQ 2/9  Decreased Interest 0 0 0 0 0  Down, Depressed, Hopeless 0 0 0 0 0  PHQ - 2 Score 0 0 0 0 0  Altered sleeping   0 1 0  Tired, decreased energy   0 1 0  Change in appetite   0 1 0  Feeling bad or failure about yourself    0 0 0  Trouble concentrating   0 0 0  Moving slowly or fidgety/restless   0 0 0  Suicidal thoughts   0 0 0  PHQ-9 Score   0 3 0  Difficult doing work/chores   Not difficult at all Not difficult at all Not difficult at all    phq 9 is negative  Fall Risk:    07/20/2024    9:39 AM 03/18/2024   12:54 PM 02/21/2024    9:09 AM 01/15/2024    8:05 AM 11/15/2023    3:09 PM  Fall Risk   Falls in the past year? 0 0 0 0 0  Number falls in past yr: 0 0 0 0 0  Injury with Fall? 0 0 0 0 0  Risk for fall due to : No Fall Risks No Fall Risks No Fall Risks No Fall Risks No Fall Risks  Follow up Falls evaluation completed Falls prevention discussed;Education provided;Falls evaluation completed Falls evaluation completed Falls prevention discussed;Education provided;Falls evaluation completed Falls prevention discussed;Education provided;Falls evaluation completed     Assessment & Plan Urge incontinence Urge incontinence with chronic urinary urgency and incontinence. Differential includes UTI, though unlikely. - Order urine culture and urinalysis. - Consider VESIcare if tests are negative.  Obstructive  sleep apnea Obstructive sleep apnea with CPAP intolerance, mild  pulmonary hypertension, and hypertensive left ventricular hypertrophy. - Refer to pulmonologist for alternative treatments.  Pulmonary hypertension Mild pulmonary hypertension possibly worsened by untreated sleep apnea. - Manage sleep apnea to prevent worsening.  Hypertensive heart disease Hypertensive left ventricular hypertrophy requiring blood pressure management to prevent heart failure.  Morbid obesity Morbid obesity with BMI >35, type 2 diabetes, and hypertension. Exercise improving A1c.  Type 2 diabetes mellitus with chronic kidney disease stage 3a and dyslipidemia Type 2 diabetes with CKD stage 3a. A1c improved to 6.3. GFR stable at 44. - Continue current diabetes medications. - Order comprehensive metabolic panel, lipid panel, vitamin D , and parathyroid  hormone levels. - Monitor B12 levels.  Malignant neoplasm of upper-outer quadrant of right breast, estrogen receptor positive, on suppressive therapy ER-positive breast cancer on Aromasin  with inconsistent adherence due to cost and side effects. Risk of recurrence without consistent therapy. - Encourage contacting oncologist for adherence discussion and alternatives. - Discuss cost assistance options with oncologist.  Postprocedural hypothyroidism Post-surgical hypothyroidism managed with medication. - Order TSH to assess thyroid  function.  Deficiency of B group vitamins History of B12 deficiency with recent high levels, on daily supplements. - Monitor B12 levels and adjust supplementation.

## 2024-07-21 ENCOUNTER — Encounter: Payer: Self-pay | Admitting: Sleep Medicine

## 2024-07-21 ENCOUNTER — Ambulatory Visit (INDEPENDENT_AMBULATORY_CARE_PROVIDER_SITE_OTHER): Admitting: Sleep Medicine

## 2024-07-21 VITALS — BP 118/80 | HR 93 | Temp 99.0°F | Ht 64.0 in | Wt 216.2 lb

## 2024-07-21 DIAGNOSIS — F5104 Psychophysiologic insomnia: Secondary | ICD-10-CM

## 2024-07-21 DIAGNOSIS — G4700 Insomnia, unspecified: Secondary | ICD-10-CM | POA: Diagnosis not present

## 2024-07-21 DIAGNOSIS — G4710 Hypersomnia, unspecified: Secondary | ICD-10-CM | POA: Diagnosis not present

## 2024-07-21 DIAGNOSIS — I10 Essential (primary) hypertension: Secondary | ICD-10-CM

## 2024-07-21 DIAGNOSIS — G4733 Obstructive sleep apnea (adult) (pediatric): Secondary | ICD-10-CM

## 2024-07-21 NOTE — Progress Notes (Unsigned)
 Name:Andrea Randolph MRN: [id number] DOB: November 26, 1955   CHIEF COMPLAINT:  EXCESSIVE DAYTIME SLEEPINESS   HISTORY OF PRESENT ILLNESS: Andrea Randolph is a 68 y.o. w/ a h/o HTN, DMII, hyperlipidemia and obesity who present for c/o loud snoring, witnessed apnea and excessive daytime sleepiness which has been present for several years. Reports nocturnal awakenings due to hot flashes and has difficulty falling back to sleep. Reports a 20 lb weight loss over the last few years. Denies morning headaches, RLS symptoms, dream enactment, cataplexy, hypnagogic or hypnapompic hallucinations. Reports a family history of sleep apnea. Denies drowsy driving. Drinks soda occasionally, denies alcohol, tobacco or illicit drug use.   Bedtime 10-11 pm Sleep onset 1-2 hours Rise time 7-8 am   EPWORTH SLEEP SCORE 16    07/21/2024    3:00 PM  Results of the Epworth flowsheet  Sitting and reading 3  Watching TV 3  Sitting, inactive in a public place (e.g. a theatre or a meeting) 3  As a passenger in a car for an hour without a break 0  Lying down to rest in the afternoon when circumstances permit 3  Sitting and talking to someone 1  Sitting quietly after a lunch without alcohol 3  In a car, while stopped for a few minutes in traffic 0  Total score 16    PAST MEDICAL HISTORY :   has a past medical history of Allergy, Anemia, Arthritis of right shoulder region, Cancer (HCC), Chronic insomnia, Diabetes mellitus without complication (HCC), Family history of adverse reaction to anesthesia, GERD (gastroesophageal reflux disease), Goiter, Heart murmur, Hyperlipidemia, Hypertension, Hypertensive retinopathy of left eye, Hypothyroidism, adult, Inflammation of urethra, Menopause, Microalbuminuria, Mixed incontinence, Muscle cramps, Obesity, OSA (obstructive sleep apnea), Personal history of radiation therapy, Radiculitis, lumbosacral, and Vitamin D  deficiency.  has a past surgical history that includes Foot surgery  (Bilateral); Total shoulder replacement (Right, 01/25/2015); Total thyroidectomy (08/01/2018); Colonoscopy with propofol  (N/A, 01/26/2019); Breast biopsy (Left, 90's); Breast biopsy (Right, 02/07/2021); Joint replacement; Breast lumpectomy with sentinel lymph node bx (Right, 03/06/2021); Breast lumpectomy (Right, 03/06/2021); Colonoscopy with propofol  (N/A, 02/09/2022); and Trigger finger release (Right, 06/10/2023). Prior to Admission medications   Medication Sig Start Date End Date Taking? Authorizing Provider  acetaminophen  (TYLENOL ) 500 MG tablet Take 500 mg by mouth every 6 (six) hours as needed.   Yes [provider]  aspirin 81 MG tablet Take 81 mg by mouth daily. 12/27/10  Yes [provider]  Calcium  Carb-Cholecalciferol (CALCIUM  500+D3 PO) Take 1 tablet by mouth daily.   Yes [provider]  Cholecalciferol (VITAMIN D3) 1000 units CAPS Take 1,000 Units by mouth daily.   Yes [provider]  cyanocobalamin  (VITAMIN B12) 1000 MCG tablet Take 1,000 mcg by mouth daily.   Yes [provider]  dapagliflozin  propanediol (FARXIGA ) 10 MG TABS tablet Take 1 tablet (10 mg total) by mouth daily. Through [employer] Patient Assistance 03/18/24  Yes Sowles, Krichna, MD  diclofenac  Sodium (VOLTAREN ) 1 % GEL Apply 2 g topically 4 (four) times daily as needed. 05/29/21  Yes Sowles, Krichna, MD  Evolocumab  (REPATHA  SURECLICK) 140 MG/ML SOAJ INJECT 140 MG INTO THE SKIN EVERY 14 (FOURTEEN) DAYS. 05/01/24  Yes Sowles, Krichna, MD  exemestane  (AROMASIN ) 25 MG tablet Take 1 tablet (25 mg total) by mouth daily. 03/09/24  Yes Babara Call, MD  ezetimibe  (ZETIA ) 10 MG tablet Take 1 tablet (10 mg total) by mouth daily. 07/20/24  Yes Sowles, Krichna, MD  glucose blood test  strip  12/30/08  Yes [provider]  levothyroxine  (SYNTHROID ) 125 MCG tablet TAKE 1 TABLET BY MOUTH EVERY DAY 07/01/24  Yes Sowles, Krichna, MD  Olmesartan -amLODIPine -HCTZ 40-5-25 MG TABS Take 1 tablet by mouth  daily with breakfast. 07/20/24  Yes Sowles, Krichna, MD  Semaglutide  (RYBELSUS ) 14 MG TABS Take 1 tablet (14 mg total) by mouth daily at 6 (six) AM. Through [employer] Patient Assistance 04/04/23  Yes Glenard, Krichna, MD  tretinoin (RETIN-A) 0.025 % cream Apply 1 Application topically daily. 09/20/23  Yes [provider]   Allergies  Allergen Reactions   Pantoprazole  Other (See Comments)    Affects thyroid     Rosuvastatin  Other (See Comments)    Myalgia     FAMILY HISTORY:  family history includes Breast cancer (age of onset: 35) in her maternal aunt; CVA in her brother; Diabetes in her sister; Heart disease in her father and mother; Hypertension in her brother, sister, and sister; Kidney disease in her sister; Kidney failure in her sister. SOCIAL HISTORY:  reports that she has never smoked. She has never used smokeless tobacco. She reports that she does not drink alcohol and does not use drugs.   Review of Systems:  Gen:  Denies  fever, sweats, chills weight loss  HEENT: Denies blurred vision, double vision, ear pain, eye pain, hearing loss, nose bleeds, sore throat Cardiac:  No dizziness, chest pain or heaviness, chest tightness,edema, No JVD Resp:   No cough, -sputum production, -shortness of breath,-wheezing, -hemoptysis,  Gi: Denies swallowing difficulty, stomach pain, nausea or vomiting, diarrhea, constipation, bowel incontinence Gu:  Denies bladder incontinence, burning urine Ext:   Denies Joint pain, stiffness or swelling Skin: Denies  skin rash, easy bruising or bleeding or hives Endoc:  Denies polyuria, polydipsia , polyphagia or weight change Psych:   Denies depression, insomnia or hallucinations  Other:  All other systems negative  VITAL SIGNS: There were no vitals taken for this visit.   Physical Examination:   General Appearance: No distress  EYES PERRLA, EOM intact.   NECK Supple, No JVD Pulmonary: normal breath sounds, No wheezing.   CardiovascularNormal S1,S2.  No m/r/g.   Abdomen: Benign, Soft, non-tender. Skin:   warm, no rashes, no ecchymosis  Extremities: normal, no cyanosis, clubbing. Neuro:without focal findings,  speech normal  PSYCHIATRIC: Mood, affect within normal limits.   ASSESSMENT AND PLAN  OSA I suspect that OSA is likely present due to clinical presentation. Discussed the consequences of untreated sleep apnea. Advised not to drive drowsy for safety of patient and others. Will complete further evaluation with a home sleep study and follow up to review results.    HTN Stable, on current management. Following with PCP.   Insomnia Counseled patient on stimulus control and improving sleep hygiene practices.    MEDICATION ADJUSTMENTS/LABS AND TESTS ORDERED: Recommend Sleep Study   Patient  satisfied with Plan of action and management. All questions answered  Follow up to review HST results and treatment plan.   I spent a total of 30 minutes reviewing chart data, face-to-face evaluation with the patient, counseling and coordination of care as detailed above.    Inaaya Vellucci, M.D.  Sleep Medicine [facility]

## 2024-07-21 NOTE — Patient Instructions (Signed)
 [name]

## 2024-07-22 ENCOUNTER — Ambulatory Visit: Payer: Self-pay | Admitting: Family Medicine

## 2024-07-22 LAB — CULTURE, URINE COMPREHENSIVE
MICRO NUMBER:: 16937384
SPECIMEN QUALITY:: ADEQUATE

## 2024-07-22 LAB — VITAMIN B12: Vitamin B-12: >2000 pg/mL — ABNORMAL HIGH (ref 200–1100)

## 2024-07-22 LAB — COMPREHENSIVE METABOLIC PANEL WITH GFR
AG Ratio: 1.6 (calc) (ref 1.0–2.5)
ALT: 21 U/L (ref 6–29)
AST: 18 U/L (ref 10–35)
Albumin: 4.3 g/dL (ref 3.6–5.1)
Alkaline phosphatase (APISO): 57 U/L (ref 37–153)
BUN/Creatinine Ratio: 17 (calc) (ref 6–22)
BUN: 21 mg/dL (ref 7–25)
CO2: 31 mmol/L (ref 20–32)
Calcium: 9.7 mg/dL (ref 8.6–10.4)
Chloride: 103 mmol/L (ref 98–110)
Creat: 1.22 mg/dL — ABNORMAL HIGH (ref 0.50–1.05)
Globulin: 2.7 g/dL (ref 1.9–3.7)
Glucose, Bld: 99 mg/dL (ref 65–99)
Potassium: 4.1 mmol/L (ref 3.5–5.3)
Sodium: 142 mmol/L (ref 135–146)
Total Bilirubin: 0.4 mg/dL (ref 0.2–1.2)
Total Protein: 7 g/dL (ref 6.1–8.1)
eGFR: 49 mL/min/1.73m2 — ABNORMAL LOW (ref >=60)

## 2024-07-22 LAB — URINALYSIS, COMPLETE
Bacteria, UA: NONE SEEN /HPF
Bilirubin Urine: NEGATIVE
Hgb urine dipstick: NEGATIVE
Hyaline Cast: NONE SEEN /LPF
Ketones, ur: NEGATIVE
Nitrite: NEGATIVE
Protein, ur: NEGATIVE
RBC / HPF: NONE SEEN /HPF (ref 0–2)
Specific Gravity, Urine: 1.023 (ref 1.001–1.035)
Squamous Epithelial / HPF: NONE SEEN /HPF (ref <=5)
pH: 6.5 (ref 5.0–8.0)

## 2024-07-22 LAB — LIPID PANEL
Cholesterol: 174 mg/dL (ref <200)
HDL: 62 mg/dL (ref >=50)
LDL Cholesterol (Calc): 96 mg/dL
Non-HDL Cholesterol (Calc): 112 mg/dL (ref <130)
Total CHOL/HDL Ratio: 2.8 (calc) (ref <5.0)
Triglycerides: 71 mg/dL (ref <150)

## 2024-07-22 LAB — VITAMIN D 25 HYDROXY (VIT D DEFICIENCY, FRACTURES): Vit D, 25-Hydroxy: 47 ng/mL (ref 30–100)

## 2024-07-22 LAB — PARATHYROID HORMONE, INTACT (NO CA): PTH: 36 pg/mL (ref 16–77)

## 2024-07-22 LAB — TSH: TSH: 0.76 m[IU]/L (ref 0.40–4.50)

## 2024-07-24 ENCOUNTER — Other Ambulatory Visit: Payer: Self-pay | Admitting: Family Medicine

## 2024-07-24 DIAGNOSIS — E890 Postprocedural hypothyroidism: Secondary | ICD-10-CM

## 2024-07-27 ENCOUNTER — Other Ambulatory Visit: Payer: Self-pay | Admitting: Family Medicine

## 2024-07-27 DIAGNOSIS — N3941 Urge incontinence: Secondary | ICD-10-CM

## 2024-07-27 MED ORDER — SOLIFENACIN SUCCINATE 10 MG PO TABS: 10.0000 mg | ORAL_TABLET | Freq: Every day | ORAL | 1 refills | Status: AC

## 2024-08-07 ENCOUNTER — Other Ambulatory Visit: Payer: Self-pay | Admitting: Family Medicine

## 2024-08-12 ENCOUNTER — Telehealth: Payer: Self-pay

## 2024-08-12 NOTE — Telephone Encounter (Signed)
 Gave pt a call to let pt know she will be receiving PAP [employer] (Farxiga ) in the mail, spoke with pt and is aware, faxed provider portion today.

## 2024-08-14 NOTE — Telephone Encounter (Signed)
 Received provider portion [employer] Farxiga  back from provider office.

## 2024-08-17 ENCOUNTER — Telehealth: Payer: Self-pay

## 2024-08-17 ENCOUNTER — Other Ambulatory Visit: Payer: Self-pay | Admitting: Pharmacist

## 2024-08-17 ENCOUNTER — Encounter: Payer: Self-pay | Admitting: Pharmacist

## 2024-08-17 DIAGNOSIS — N1832 Chronic kidney disease, stage 3b: Secondary | ICD-10-CM

## 2024-08-17 NOTE — Progress Notes (Signed)
   08/17/2024  Patient ID: Andrea Randolph, female   DOB: July 08, 1956, 68 y.o.   MRN: [id number]  Patient enrolled in Rybelsus  patient assistance program from [employer] through 11/11/2024  [employer] has announced that they will no longer offer Rybelsus  to [employer] beneficiaries through their Patient Assistance Program in 2026.   Outreach to patient today and provide this update.   Based on criteria for Extra Help subsidy, patient does not believe that she meets criteria, but plans to check when she gets home and will reach out if finds that she meets criteria/would like help with applying  Counsel patient that Medicare's Annual Enrollment Period for 2026 starts 08/26/2024. Encourage patient to review deductibles formulary coverage and copay amounts (including for Ozempic ) between plans. Also share with patient the contact information for the [facility] Information Program [facility]) that can help with reviewing these Medicare plans As requested will share this information with patient via MyChart message  Will collaborate with CPhT Shasta Spear to request that she initiate refill of Rybelsus  from [employer] for patient  Follow Up Plan: Schedule follow up for patient to speak with Clinical Pharmacist Peyton Ferries by telephone on 09/14/2024 at 1:00 PM       Sharyle Sia, PharmD, [facility] [phone]

## 2024-08-17 NOTE — Patient Instructions (Signed)
 Novo Nordisk, the maker of Rybelsus , has announced that they will no longer offer these medications to Medicare beneficiaries through their Patient Assistance Program in 2026.    This means that if you currently receive Rybelsus  at no cost through the program, starting in January 2026 you'll need to use your insurance to continue on these medicines.      Choosing a Medicare Plan   With Medicare's Annual Enrollment Period starting on October 15th, we encourage you to review formulary coverage and copay amounts for Ozempic  or Rybelsus , as costs can vary widely between plans. Starting on Wednesday, October 1st you can compare your Medicare plan options by going to the Plan Finder tool at [employer].gov ( TeleconferenceOnDemand.fr ).    There are Medicare Specialists through the [place] Health Insurance Information Program ([place]) that can help you shop for Medicare plans.    [place] [facility] toll free number: [phone] (Monday - Friday 8 am - 5 pm)   There are representatives located in each county:    [facility] [address]     [place]  [phone] Call for an appointment with [facility]     Medicare Extra Help   Some patients may qualify for a program called Medicare Extra Help that could help lower the cost of prescriptions on your chosen Medicare plan. If you meet the below income criteria, please visit [email or web address] to apply, or respond to this message and one of our pharmacy team members can call you to help apply.      Your Household Size Annual income limit Resource Limit*  Individual [id number] $17,600  Married Couple [phone] $35,130    *Resources include:   -  Money in a checking, savings, or retirement account   -  Stocks   -  Bonds   Medicare Does NOT consider the following in your resource limit:   -  Your home   -  One car   -  Burial plot   -  Up to $1,500  for burial expenses if you have put that money aside   -  Furniture     Sharyle Sia, PharmD, [facility] [phone]

## 2024-08-17 NOTE — Telephone Encounter (Signed)
 Fillled and faxed porivder portion Novo Nordisk Rybelsus  for provider to sign and date can be fax to [employer] or back to [phone].

## 2024-08-18 ENCOUNTER — Other Ambulatory Visit (HOSPITAL_COMMUNITY): Payer: Self-pay

## 2024-08-19 NOTE — Telephone Encounter (Signed)
 Received refill reorder form back from provider office Novo Nordisk Rybelsus , faxed to [employer] today

## 2024-08-26 ENCOUNTER — Ambulatory Visit (INDEPENDENT_AMBULATORY_CARE_PROVIDER_SITE_OTHER)

## 2024-08-26 DIAGNOSIS — Z23 Encounter for immunization: Secondary | ICD-10-CM

## 2024-09-02 ENCOUNTER — Encounter

## 2024-09-02 DIAGNOSIS — G4730 Sleep apnea, unspecified: Secondary | ICD-10-CM | POA: Diagnosis not present

## 2024-09-02 DIAGNOSIS — G4733 Obstructive sleep apnea (adult) (pediatric): Secondary | ICD-10-CM

## 2024-09-08 ENCOUNTER — Inpatient Hospital Stay: Attending: Oncology

## 2024-09-08 ENCOUNTER — Inpatient Hospital Stay: Admitting: Oncology

## 2024-09-08 ENCOUNTER — Encounter: Payer: Self-pay | Admitting: Oncology

## 2024-09-08 VITALS — BP 140/75 | HR 64 | Temp 97.0°F | Resp 18 | Wt 212.2 lb

## 2024-09-08 DIAGNOSIS — Z79811 Long term (current) use of aromatase inhibitors: Secondary | ICD-10-CM | POA: Insufficient documentation

## 2024-09-08 DIAGNOSIS — Z170 Estrogen receptor positive status [ER+]: Secondary | ICD-10-CM | POA: Diagnosis not present

## 2024-09-08 DIAGNOSIS — R0683 Snoring: Secondary | ICD-10-CM | POA: Diagnosis not present

## 2024-09-08 DIAGNOSIS — Z1732 Human epidermal growth factor receptor 2 negative status: Secondary | ICD-10-CM | POA: Diagnosis not present

## 2024-09-08 DIAGNOSIS — N1831 Chronic kidney disease, stage 3a: Secondary | ICD-10-CM

## 2024-09-08 DIAGNOSIS — Z1721 Progesterone receptor positive status: Secondary | ICD-10-CM | POA: Insufficient documentation

## 2024-09-08 DIAGNOSIS — Z923 Personal history of irradiation: Secondary | ICD-10-CM | POA: Diagnosis not present

## 2024-09-08 DIAGNOSIS — C50211 Malignant neoplasm of upper-inner quadrant of right female breast: Secondary | ICD-10-CM | POA: Insufficient documentation

## 2024-09-08 DIAGNOSIS — G4733 Obstructive sleep apnea (adult) (pediatric): Secondary | ICD-10-CM | POA: Diagnosis not present

## 2024-09-08 LAB — CBC WITH DIFFERENTIAL (CANCER CENTER ONLY)
Abs Immature Granulocytes: 0.01 K/uL (ref 0.00–0.07)
Basophils Absolute: 0 K/uL (ref 0.0–0.1)
Basophils Relative: 1 %
Eosinophils Absolute: 0.1 K/uL (ref 0.0–0.5)
Eosinophils Relative: 2 %
HCT: 44.2 % (ref 36.0–46.0)
Hemoglobin: 14.3 g/dL (ref 12.0–15.0)
Immature Granulocytes: 0 %
Lymphocytes Relative: 42 %
Lymphs Abs: 1.6 K/uL (ref 0.7–4.0)
MCH: 26.6 pg (ref 26.0–34.0)
MCHC: 32.4 g/dL (ref 30.0–36.0)
MCV: 82.3 fL (ref 80.0–100.0)
Monocytes Absolute: 0.3 K/uL (ref 0.1–1.0)
Monocytes Relative: 8 %
Neutro Abs: 1.8 K/uL (ref 1.7–7.7)
Neutrophils Relative %: 47 %
Platelet Count: 342 K/uL (ref 150–400)
RBC: 5.37 MIL/uL — ABNORMAL HIGH (ref 3.87–5.11)
RDW: 14.3 % (ref 11.5–15.5)
WBC Count: 3.7 K/uL — ABNORMAL LOW (ref 4.0–10.5)
nRBC: 0 % (ref 0.0–0.2)

## 2024-09-08 LAB — CMP (CANCER CENTER ONLY)
ALT: 18 U/L (ref 0–44)
AST: 22 U/L (ref 15–41)
Albumin: 4.1 g/dL (ref 3.5–5.0)
Alkaline Phosphatase: 58 U/L (ref 38–126)
Anion gap: 10 (ref 5–15)
BUN: 18 mg/dL (ref 8–23)
CO2: 27 mmol/L (ref 22–32)
Calcium: 10 mg/dL (ref 8.9–10.3)
Chloride: 103 mmol/L (ref 98–111)
Creatinine: 1.5 mg/dL — ABNORMAL HIGH (ref 0.44–1.00)
GFR, Estimated: 38 mL/min — ABNORMAL LOW (ref >60)
Glucose, Bld: 114 mg/dL — ABNORMAL HIGH (ref 70–99)
Potassium: 3.7 mmol/L (ref 3.5–5.1)
Sodium: 140 mmol/L (ref 135–145)
Total Bilirubin: 0.7 mg/dL (ref 0.0–1.2)
Total Protein: 7.6 g/dL (ref 6.5–8.1)

## 2024-09-08 MED ORDER — EXEMESTANE 25 MG PO TABS: 25.0000 mg | ORAL_TABLET | Freq: Every day | ORAL | 1 refills | Status: DC

## 2024-09-08 NOTE — Progress Notes (Signed)
 Hematology/Oncology Progress note [phone] [phone]     CHIEF COMPLAINTS/REASON FOR VISIT:  Follow up of breast cancer.    ASSESSMENT & PLAN:   Cancer Staging  Malignant 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, estrogen receptor positive (HCC) Staging form: Breast, AJCC 8th Edition - Pathologic stage from 03/20/2021: Stage IA (pT1a, pN0, cM0, G2, ER+, PR+, HER2-) - Signed by Babara Call, MD on 03/20/2021   Malignant 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, estrogen receptor positive (HCC) Right breast invasive carcinoma.  pT1a pN0, ER positive, PR positive, HER2 negative. Focal DCIS margin positive.Patient declined reexcision.s/p adjuvant radiation.  Unable to tolerate Arimidex   switched to Aromasin . Labs reviewed and discussed with patient Continue Aromasin  25mg  daily. -Encourage patient to take Aromasin  daily Continue annual mammogram,  March 2026   No orders of the defined types were placed in this encounter.  Recommend 6 months.  All questions were answered. The patient knows to call the clinic with any problems, questions or concerns.  Call Babara, MD, PhD [facility] Hematology Oncology 09/08/2024      HISTORY OF PRESENTING ILLNESS:   Andrea Randolph is a  68 y.o.  female with PMH listed below was seen in consultation at the request of  Sowles, Krichna, MD  for evaluation of breast cancer.   01/12/21 screening mammogram showed focal asymmetry with possible distortion.  01/26/21 right diagnostic mammogram and US  showed 3x5x63mm hypoechoic mass, s/p biopsy on 02/07/21 Pathology showed invasive mammary carcinoma, grade 2, ER90% PR 90%, HER2 negative.   Patient was seen by Dr.Byrnett yesterday. She presents to establish care with oncology Accompanied by her daughter  Menarche 84 years with Age at first childbirth 28.  OCP use for about 20 years Denies any estrogen replacement. LMP in 79s. Denies any previous chest  radiation. History of left breast biopsy previously. Family history Father for maternal aunt who was diagnosed of breast cancer at age of 68.  03/06/2021 lumpectomy and sentinel lymph node biopsy. Pathology showed invasive mammary carcinoma, no special type, DCIS in situ, intermediate grade.  All 3 sentinel lymph nodes were negative.  1 nonsentinel lymph node was negative.  Margins for invasive carcinoma was negative.  Margins for DCIS was unifocal positive. Patient declined reexcision.Patient's case was discussed on breast cancer tumor board on 03/20/2021.  # 05/03/2021 finished adjuvant radiation.  05/09/2021, patient started on Arimidex .  Patient called to report that she has experienced significant leg pain, back pain, neck pain shortly after starting Arimidex .  left knee pain and swelling.  06/20/2021, left knee x-ray showed diffuse joint space narrowing of the left knee with spurring at the lateral compartment.  Small joint effusion.  07/20/2021 patient was seen by orthopedic surgeon for left knee arthritis.  Patient received intra-articular corticosteroid injection and left knee pain has improved.  01/15/2022  bilateral diagnostic mammogram results were reviewed and discussed with patient.  Continue annual mammogram. Bone health, 05/16/2021, normal bone density.  INTERVAL HISTORY Andrea Randolph is a 68 y.o. female who has above history reviewed by me today presents for follow up visit for management of breast cancer + Chronic Arthralgia and hot flash, manageable.  she takes Aromasin  on some days occationally she gets sharp breast pain Otherwise no new complaints.   Review of Systems  Constitutional:  Negative for appetite change, chills, fatigue and fever.  HENT:   Negative for hearing loss and voice change.   Eyes:  Negative for eye problems.  Respiratory:  Negative for chest tightness and cough.   Cardiovascular:  Negative for chest pain.  Gastrointestinal:  Negative for abdominal  distention, abdominal pain and blood in stool.  Endocrine: Negative for hot flashes.  Genitourinary:  Negative for difficulty urinating and frequency.   Musculoskeletal:  Positive for arthralgias.  Skin:  Negative for itching and rash.  Neurological:  Negative for extremity weakness.  Hematological:  Negative for adenopathy.  Psychiatric/Behavioral:  Negative for confusion. The patient is not nervous/anxious.     MEDICAL HISTORY:  Past Medical History:  Diagnosis Date   Allergy    Anemia    Arthritis of right shoulder region    Dr. Claudene   Cancer [facility])    breast cancer right side   Chronic insomnia    Diabetes mellitus without complication (HCC)    Family history of adverse reaction to anesthesia    sister with Nausea   GERD (gastroesophageal reflux disease)    Goiter    Dr. Damian   Heart murmur    Hyperlipidemia    Hypertension    Hypertensive retinopathy of left eye    Hypothyroidism, adult    Dr. Damian   Inflammation of urethra    Menopause    Microalbuminuria    DM   Mixed incontinence    Muscle cramps    Obesity    OSA (obstructive sleep apnea)    Personal history of radiation therapy    Breast Cancer Right breast   Radiculitis, lumbosacral    Vitamin D  deficiency     SURGICAL HISTORY: Past Surgical History:  Procedure Laterality Date   BREAST BIOPSY Left 90's   benign core needle    BREAST BIOPSY Right 02/07/2021   US  Bx, Q-clip, DCIS   BREAST LUMPECTOMY Right 03/06/2021   BREAST LUMPECTOMY WITH SENTINEL LYMPH NODE BIOPSY Right 03/06/2021   Procedure: BREAST LUMPECTOMY WITH SENTINEL LYMPH NODE BX;  Surgeon: Dessa Reyes ORN, MD;  Location: [facility] ORS;  Service: General;  Laterality: Right;   COLONOSCOPY WITH PROPOFOL  N/A 01/26/2019   Procedure: COLONOSCOPY WITH PROPOFOL ;  Surgeon: Unk Corinn Skiff, MD;  Location: [facility] ENDOSCOPY;  Service: Gastroenterology;  Laterality: N/A;   COLONOSCOPY WITH PROPOFOL  N/A 02/09/2022   Procedure: COLONOSCOPY WITH  PROPOFOL ;  Surgeon: Therisa Bi, MD;  Location: [facility] ENDOSCOPY;  Service: Gastroenterology;  Laterality: N/A;   FOOT SURGERY Bilateral    heel spurs   JOINT REPLACEMENT     TOTAL SHOULDER REPLACEMENT Right 01/25/2015   Dr. Edie   TOTAL THYROIDECTOMY  08/01/2018   TRIGGER FINGER RELEASE Right 06/10/2023   ring finger - Dr. Kathlynn    SOCIAL HISTORY: Social History   Socioeconomic History   Marital status: Legally Separated    Spouse name: Not on file   Number of children: 1   Years of education: Not on file   Highest education level: 12th grade  Occupational History   Occupation: biomedical scientist  Tobacco Use   Smoking status: Never   Smokeless tobacco: Never  Vaping Use   Vaping status: Never Used  Substance and Sexual Activity   Alcohol use: No    Alcohol/week: 0.0 standard drinks of alcohol   Drug use: No   Sexual activity: Not Currently    Birth control/protection: Post-menopausal  Other Topics Concern   Not on file  Social History Narrative   Separated in around 2009.   She was living with her daughter, however currently moved in with her sister and nephew , but is looking  for a house.    Social Drivers of Corporate Investment Banker Strain: Low Risk  (02/21/2024)   Overall Financial Resource Strain (CARDIA)    Difficulty of Paying Living Expenses: Not hard at all  Food Insecurity: No Food Insecurity (02/21/2024)   Hunger Vital Sign    Worried About Running Out of Food in the Last Year: Never true    Ran Out of Food in the Last Year: Never true  Transportation Needs: No Transportation Needs (02/21/2024)   PRAPARE - Administrator, Civil Service (Medical): No    Lack of Transportation (Non-Medical): No  Physical Activity: Sufficiently Active (02/21/2024)   Exercise Vital Sign    Days of Exercise per Week: 3 days    Minutes of Exercise per Session: 90 min  Recent Concern: Physical Activity - Insufficiently Active (01/15/2024)   Exercise Vital Sign     Days of Exercise per Week: 3 days    Minutes of Exercise per Session: 20 min  Stress: No Stress Concern Present (02/21/2024)   [employer] of Occupational Health - Occupational Stress Questionnaire    Feeling of Stress : Not at all  Social Connections: Moderately Integrated (02/21/2024)   Social Connection and Isolation Panel    Frequency of Communication with Friends and Family: More than three times a week    Frequency of Social Gatherings with Friends and Family: More than three times a week    Attends Religious Services: More than 4 times per year    Active Member of [employer] or Organizations: Yes    Attends Banker Meetings: More than 4 times per year    Marital Status: Separated  Intimate Partner Violence: Not At Risk (02/21/2024)   Humiliation, Afraid, Rape, and Kick questionnaire    Fear of Current or Ex-Partner: No    Emotionally Abused: No    Physically Abused: No    Sexually Abused: No    FAMILY HISTORY: Family History  Problem Relation Age of Onset   Heart disease Mother    Heart disease Father    Kidney disease Sister    Hypertension Sister    Diabetes Sister    Hypertension Sister    Kidney failure Sister    Hypertension Brother    CVA Brother    Breast cancer Maternal Aunt 50   Ovarian cancer Neg Hx    Colon cancer Neg Hx     ALLERGIES:  is allergic to pantoprazole  and rosuvastatin .  MEDICATIONS:  Current Outpatient Medications  Medication Sig Dispense Refill   acetaminophen  (TYLENOL ) 500 MG tablet Take 500 mg by mouth every 6 (six) hours as needed.     aspirin 81 MG tablet Take 81 mg by mouth daily.     Calcium  Carb-Cholecalciferol (CALCIUM  500+D3 PO) Take 1 tablet by mouth daily.     Cholecalciferol (VITAMIN D3) 1000 units CAPS Take 1,000 Units by mouth daily.     dapagliflozin  propanediol (FARXIGA ) 10 MG TABS tablet Take 1 tablet (10 mg total) by mouth daily. Through [place] Patient Assistance 90 tablet 3   diclofenac  Sodium (VOLTAREN ) 1 %  GEL Apply 2 g topically 4 (four) times daily as needed. 100 g 2   Evolocumab  (REPATHA  SURECLICK) 140 MG/ML SOAJ INJECT 140 MG INTO THE SKIN EVERY 14 (FOURTEEN) DAYS. 6 mL 1   exemestane  (AROMASIN ) 25 MG tablet Take 1 tablet (25 mg total) by mouth daily. 90 tablet 1   ezetimibe  (ZETIA ) 10 MG tablet Take 1 tablet (10 mg  total) by mouth daily. 90 tablet 1   glucose blood test strip      levothyroxine  (SYNTHROID ) 125 MCG tablet TAKE 1 TABLET BY MOUTH EVERY DAY 90 tablet 1   Olmesartan -amLODIPine -HCTZ 40-5-25 MG TABS Take 1 tablet by mouth daily with breakfast. 90 tablet 1   Semaglutide  (RYBELSUS ) 14 MG TABS Take 1 tablet (14 mg total) by mouth daily at 6 (six) AM. Through [employer] Patient Assistance 90 tablet 0   solifenacin  (VESICARE ) 10 MG tablet Take 1 tablet (10 mg total) by mouth daily. 90 tablet 1   cyanocobalamin  (VITAMIN B12) 1000 MCG tablet Take 1,000 mcg by mouth daily. (Patient not taking: Reported on 09/08/2024)     tretinoin (RETIN-A) 0.025 % cream Apply 1 Application topically daily. (Patient not taking: Reported on 09/08/2024)     No current facility-administered medications for this visit.     PHYSICAL EXAMINATION: ECOG PERFORMANCE STATUS: 0 - Asymptomatic Vitals:   09/08/24 1055  BP: (!) 140/75  Pulse: 64  Resp: 18  Temp: (!) 97 F (36.1 C)  SpO2: 100%   Filed Weights   09/08/24 1055  Weight: 212 lb 3.2 oz (96.3 kg)    Physical Exam Constitutional:      General: She is not in acute distress. HENT:     Head: Normocephalic and atraumatic.  Eyes:     General: No scleral icterus. Cardiovascular:     Rate and Rhythm: Normal rate and regular rhythm.     Heart sounds: Murmur heard.  Pulmonary:     Effort: Pulmonary effort is normal. No respiratory distress.     Breath sounds: No wheezing.  Abdominal:     General: Bowel sounds are normal. There is no distension.     Palpations: Abdomen is soft.  Musculoskeletal:        General: No deformity. Normal range of  motion.     Cervical back: Normal range of motion and neck supple.  Skin:    General: Skin is warm and dry.     Findings: No erythema or rash.  Neurological:     Mental Status: She is alert and oriented to person, place, and time. Mental status is at baseline.     Cranial Nerves: No cranial nerve deficit.     Coordination: Coordination normal.  Psychiatric:        Mood and Affect: Mood normal.      LABORATORY DATA:  I have reviewed the data as listed    Latest Ref Rng & Units 09/08/2024   10:27 AM 03/09/2024   10:21 AM 09/10/2023    9:44 AM  CBC  WBC 4.0 - 10.5 K/uL 3.7  4.7  3.7   Hemoglobin 12.0 - 15.0 g/dL 85.6  86.3  85.8   Hematocrit 36.0 - 46.0 % 44.2  42.2  42.8   Platelets 150 - 400 K/uL 342  322  382       Latest Ref Rng & Units 09/08/2024   10:26 AM 07/20/2024   10:54 AM 03/09/2024   10:21 AM  CMP  Glucose 70 - 99 mg/dL 885  99  846   BUN 8 - 23 mg/dL 18  21  24    Creatinine 0.44 - 1.00 mg/dL 8.49  8.77  8.66   Sodium 135 - 145 mmol/L 140  142  138   Potassium 3.5 - 5.1 mmol/L 3.7  4.1  3.6   Chloride 98 - 111 mmol/L 103  103  103   CO2 22 -  32 mmol/L 27  31  25    Calcium  8.9 - 10.3 mg/dL 89.9  9.7  9.1   Total Protein 6.5 - 8.1 g/dL 7.6  7.0  7.1   Total Bilirubin 0.0 - 1.2 mg/dL 0.7  0.4  0.3   Alkaline Phos 38 - 126 U/L 58   51   AST 15 - 41 U/L 22  18  24    ALT 0 - 44 U/L 18  21  22       RADIOGRAPHIC STUDIES: I have personally reviewed the radiological images as listed and agreed with the findings in the report. No results found.

## 2024-09-08 NOTE — Assessment & Plan Note (Signed)
Recommend calcium and vitamin D Repeat DEXA in July 2024 showed normal bone density.

## 2024-09-08 NOTE — Assessment & Plan Note (Addendum)
 Right breast invasive carcinoma.  pT1a pN0, ER positive, PR positive, HER2 negative. Focal DCIS margin positive.Patient declined reexcision.s/p adjuvant radiation.  Unable to tolerate Arimidex   switched to Aromasin . Labs reviewed and discussed with patient Continue Aromasin  25mg  daily. -Encourage patient to take Aromasin  daily Continue annual mammogram,  March 2026

## 2024-09-08 NOTE — Assessment & Plan Note (Signed)
 Encourage oral hydration and avoid nephrotoxins.

## 2024-09-09 ENCOUNTER — Inpatient Hospital Stay: Admitting: Licensed Clinical Social Worker

## 2024-09-09 ENCOUNTER — Ambulatory Visit: Payer: Self-pay

## 2024-09-09 NOTE — Progress Notes (Signed)
 [facility] Clinical Social Work  Clinical Social Work was referred by medical provider for financial concerns.  Clinical Social Worker contacted patient by phone to offer support and assess for needs.  Patient reports having difficulty paying for one of her medications.  She has been in contact with pharmacy and was given the best price.  Patient lives with her two sisters and nephew.   Interventions: Provided patient with information about not being eligible for the [facility]  Offered other community resources.  Patient does not receive food stamps and is not in treatment.       Follow Up Plan:  Patient will contact CSW with any support or resource needs    Macario CHRISTELLA Au, LCSW  Clinical Social Worker [facility]

## 2024-09-14 ENCOUNTER — Other Ambulatory Visit: Payer: Self-pay

## 2024-09-14 ENCOUNTER — Other Ambulatory Visit (HOSPITAL_COMMUNITY): Payer: Self-pay

## 2024-09-14 ENCOUNTER — Other Ambulatory Visit (INDEPENDENT_AMBULATORY_CARE_PROVIDER_SITE_OTHER)

## 2024-09-14 NOTE — Progress Notes (Signed)
 S:     Reason for visit: ?  Andrea Randolph is a 68 y.o. female with a history of diabetes (type 2), who presents today for a follow up diabetes Telephone pharmacotherapy visit.? Pertinent PMH also includes HTN, aortic stenosis, breast cancer, HLD, OSA, left ventricular hypertrophy.   Care Team: Primary Care Provider: Sowles, Krichna, MD  Current diabetes medications include: Farxiga  10 mg daily, Rybelsus  14 mg daily  Previous diabetes medications include: Synjardy , Xigduo  Current hypertension medications include: olmesartan /amlodipine /hydrochlorothiazide  40/5/25 mg daily  Current hyperlipidemia medications include: Repatha  140 mg every 14 days, ezetimibe  10 mg daily   Patient reports adherence to taking all medications as prescribed.   Have you been experiencing any side effects to the medications prescribed? Yes - some dry mouth with solifenacin  Do you have any problems obtaining medications due to transportation or finances? Yes, cost of Aromasin  Insurance coverage: [employer]  Current medication access support:  Hypercholesteremia healthwell grant through 02/28/2025  Farxiga  via [place] Rybelsus  via Novo Nordisk  Patient denies hypoglycemic events.  Reported home fasting blood sugars: not checking   Patient reported dietary habits: Eats 2 meals/day Breakfast: banana, yogurt, bagel Dinner: chicken or pork, vegetables, carb (rice or pasta)  Patient-reported exercise habits: workout classes most days of the week  DM Prevention:  Statin: Intolerant to.?  ACE/ARB: yes; olmesartan  History of chronic kidney disease? yes Last urinary albumin/creatinine ratio:  Lab Results  Component Value Date   MICRALBCREAT 10 03/18/2024   MICRALBCREAT normal 01/19/2024   MICRALBCREAT 21 11/26/2022   MICRALBCREAT 15 12/06/2021   MICRALBCREAT 15 06/20/2020   MICRALBCREAT 35 (H) 10/26/2019   MICRALBCREAT 6 01/14/2017   Last eye exam:  Lab Results  Component Value Date   HMDIABEYEEXA  No Retinopathy 04/22/2024   Lab Results  Component Value Date   HMDIABEYEEXA No Retinopathy 04/22/2024   Last foot exam: No foot exam found Tobacco Use:  Tobacco Use: Low Risk  (09/08/2024)   Patient History    Smoking Tobacco Use: Never    Smokeless Tobacco Use: Never    Passive Exposure: Not on file   O:   Vitals:  Wt Readings from Last 3 Encounters:  09/08/24 212 lb 3.2 oz (96.3 kg)  07/21/24 216 lb 3.2 oz (98.1 kg)  07/20/24 216 lb 9.6 oz (98.2 kg)   BP Readings from Last 3 Encounters:  09/08/24 (!) 140/75  07/21/24 118/80  07/20/24 124/76   Pulse Readings from Last 3 Encounters:  09/08/24 64  07/21/24 93  07/20/24 87     Labs:?  Lab Results  Component Value Date   HGBA1C 6.3 (A) 07/20/2024   HGBA1C 6.5 (A) 03/18/2024   HGBA1C 7.1 (A) 11/15/2023   GLUCOSE 114 (H) 09/08/2024   MICRALBCREAT 10 03/18/2024   MICRALBCREAT normal 01/19/2024   MICRALBCREAT 21 11/26/2022   CREATININE 1.50 (H) 09/08/2024   CREATININE 1.22 (H) 07/20/2024   CREATININE 1.33 (H) 03/09/2024    Lab Results  Component Value Date   CHOL 174 07/20/2024   LDLCALC 96 07/20/2024   LDLCALC 85 07/11/2023   LDLCALC 112 (H) 11/26/2022   HDL 62 07/20/2024   TRIG 71 07/20/2024   TRIG 72 07/11/2023   TRIG 91 11/26/2022   ALT 18 09/08/2024   ALT 21 07/20/2024   AST 22 09/08/2024   AST 18 07/20/2024      Chemistry      Component Value Date/Time   NA 140 09/08/2024 1026   NA 140 08/29/2015 1135  K 3.7 09/08/2024 1026   CL 103 09/08/2024 1026   CO2 27 09/08/2024 1026   BUN 18 09/08/2024 1026   BUN 15 08/29/2015 1135   CREATININE 1.50 (H) 09/08/2024 1026   CREATININE 1.22 (H) 07/20/2024 1054      Component Value Date/Time   CALCIUM  10.0 09/08/2024 1026   ALKPHOS 58 09/08/2024 1026   AST 22 09/08/2024 1026   ALT 18 09/08/2024 1026   BILITOT 0.7 09/08/2024 1026       The 10-year ASCVD risk score (Arnett DK, et al., 2019) is: 20.4%  Lab Results  Component Value Date    MICRALBCREAT 10 03/18/2024   MICRALBCREAT normal 01/19/2024   MICRALBCREAT 21 11/26/2022   MICRALBCREAT 15 12/06/2021   MICRALBCREAT 15 06/20/2020   MICRALBCREAT 35 (H) 10/26/2019   MICRALBCREAT 6 01/14/2017    A/P: Diabetes currently controlled with a most recent A1c of 6.3% on 07/20/24. Patient is able to verbalize appropriate hypoglycemia management plan. Medication adherence appears appropriate. Discussed that Novo Nordisk will no longer be offering PAP for 2025 for Rybelsus  for Medicare beneficiaries. Medication therapies limited with current kidney function. Pending copay in the new year, may need to switch to PAP for a DPP4i at that time.  -Continued GLP-1 Rybelsus  (semaglutide ) 14 mg daily -Continued SGLT2-I Farxiga  (dapagliflozin )10 mg daily.  -Extensively discussed pathophysiology of diabetes, recommended lifestyle interventions, dietary effects on blood sugar control.  -Counseled on s/sx of and management of hypoglycemia.   ASCVD risk - secondary prevention in patient with diabetes. Last LDL is 96 mg/dL, not at goal of <44 mg/dL.  -Continued Repatha  140 mg every 2 weeks -Continued ezetimibe  10 mg daily   MISC: Contacted [place] pharmacy regarding copay for Aromasin . It appears that it is ~$40 for a 3 month supply. Patient requested to have Rx transferred via [facility] mail order.  Patient verbalized understanding of treatment plan. Total time patient counseling 60 minutes.  Follow-up:  Pharmacist on 11/24/23 PCP clinic visit on 11/20/23  Andrea Randolph, PharmD, CPP Clinical Pharmacist [facility] [phone]

## 2024-09-15 ENCOUNTER — Other Ambulatory Visit: Payer: Self-pay

## 2024-09-16 ENCOUNTER — Other Ambulatory Visit: Admitting: Pharmacist

## 2024-09-16 ENCOUNTER — Other Ambulatory Visit (HOSPITAL_COMMUNITY): Payer: Self-pay

## 2024-09-16 MED ORDER — EXEMESTANE 25 MG PO TABS: 25.0000 mg | ORAL_TABLET | Freq: Every day | ORAL | 1 refills | Status: DC

## 2024-09-16 MED ORDER — EXEMESTANE 25 MG PO TABS
25.0000 mg | ORAL_TABLET | Freq: Every day | ORAL | 1 refills | Status: AC
  Filled 2024-10-07 – 2024-10-12 (×2): qty 90, 90d supply, fill #0

## 2024-09-17 ENCOUNTER — Ambulatory Visit: Admitting: Sleep Medicine

## 2024-09-22 ENCOUNTER — Encounter: Payer: Self-pay | Admitting: Sleep Medicine

## 2024-09-22 ENCOUNTER — Ambulatory Visit: Admitting: Sleep Medicine

## 2024-09-22 VITALS — BP 118/80 | HR 81 | Temp 98.7°F | Ht 64.0 in | Wt 215.4 lb

## 2024-09-22 DIAGNOSIS — F5104 Psychophysiologic insomnia: Secondary | ICD-10-CM | POA: Diagnosis not present

## 2024-09-22 DIAGNOSIS — G4733 Obstructive sleep apnea (adult) (pediatric): Secondary | ICD-10-CM

## 2024-09-22 DIAGNOSIS — I10 Essential (primary) hypertension: Secondary | ICD-10-CM

## 2024-09-22 MED ORDER — TRAZODONE HCL 50 MG PO TABS: 50.0000 mg | ORAL_TABLET | Freq: Every day | ORAL | 3 refills | Status: DC

## 2024-09-22 NOTE — Progress Notes (Signed)
 Name:Andrea Randolph MRN: [id number] DOB: 23-Sep-1956   CHIEF COMPLAINT:  HST F/U   HISTORY OF PRESENT ILLNESS: Andrea Randolph is a 68 y.o. w/ a h/o HTN, DMII, hyperlipidemia and obesity who presents for HST F/U. The patient underwent HST which revealed moderate OSA (AHI 22, O2 nadir 81%).   EPWORTH SLEEP SCORE 16    07/21/2024    3:00 PM  Results of the Epworth flowsheet  Sitting and reading 3  Watching TV 3  Sitting, inactive in a public place (e.g. a theatre or a meeting) 3  As a passenger in a car for an hour without a break 0  Lying down to rest in the afternoon when circumstances permit 3  Sitting and talking to someone 1  Sitting quietly after a lunch without alcohol 3  In a car, while stopped for a few minutes in traffic 0  Total score 16    PAST MEDICAL HISTORY :   has a past medical history of Allergy, Anemia, Arthritis of right shoulder region, Cancer (HCC), Chronic insomnia, Diabetes mellitus without complication (HCC), Family history of adverse reaction to anesthesia, GERD (gastroesophageal reflux disease), Goiter, Heart murmur, Hyperlipidemia, Hypertension, Hypertensive retinopathy of left eye, Hypothyroidism, adult, Inflammation of urethra, Menopause, Microalbuminuria, Mixed incontinence, Muscle cramps, Obesity, OSA (obstructive sleep apnea), Personal history of radiation therapy, Radiculitis, lumbosacral, and Vitamin D  deficiency.  has a past surgical history that includes Foot surgery (Bilateral); Total shoulder replacement (Right, 01/25/2015); Total thyroidectomy (08/01/2018); Colonoscopy with propofol  (N/A, 01/26/2019); Breast biopsy (Left, 90's); Breast biopsy (Right, 02/07/2021); Joint replacement; Breast lumpectomy with sentinel lymph node bx (Right, 03/06/2021); Breast lumpectomy (Right, 03/06/2021); Colonoscopy with propofol  (N/A, 02/09/2022); and Trigger finger release (Right, 06/10/2023). Prior to Admission medications   Medication Sig Start Date End Date  Taking? Authorizing Provider  acetaminophen  (TYLENOL ) 500 MG tablet Take 500 mg by mouth every 6 (six) hours as needed.   Yes [provider]  aspirin 81 MG tablet Take 81 mg by mouth daily. 12/27/10  Yes [provider]  Calcium  Carb-Cholecalciferol (CALCIUM  500+D3 PO) Take 1 tablet by mouth daily.   Yes [provider]  Cholecalciferol (VITAMIN D3) 1000 units CAPS Take 1,000 Units by mouth daily.   Yes [provider]  cyanocobalamin  (VITAMIN B12) 1000 MCG tablet Take 1,000 mcg by mouth daily.   Yes [provider]  dapagliflozin  propanediol (FARXIGA ) 10 MG TABS tablet Take 1 tablet (10 mg total) by mouth daily. Through [employer] Patient Assistance 03/18/24  Yes Sowles, Krichna, MD  diclofenac  Sodium (VOLTAREN ) 1 % GEL Apply 2 g topically 4 (four) times daily as needed. 05/29/21  Yes Sowles, Krichna, MD  Evolocumab  (REPATHA  SURECLICK) 140 MG/ML SOAJ INJECT 140 MG INTO THE SKIN EVERY 14 (FOURTEEN) DAYS. 05/01/24  Yes Sowles, Krichna, MD  exemestane  (AROMASIN ) 25 MG tablet Take 1 tablet (25 mg total) by mouth daily. 03/09/24  Yes Andrea Call, MD  ezetimibe  (ZETIA ) 10 MG tablet Take 1 tablet (10 mg total) by mouth daily. 07/20/24  Yes Sowles, Krichna, MD  glucose blood test strip  12/30/08  Yes [provider]  levothyroxine  (SYNTHROID ) 125 MCG tablet TAKE 1 TABLET BY MOUTH EVERY DAY 07/01/24  Yes Sowles, Krichna, MD  Olmesartan -amLODIPine -HCTZ 40-5-25 MG TABS Take 1 tablet by mouth daily with breakfast. 07/20/24  Yes Sowles, Krichna, MD  Semaglutide  (RYBELSUS ) 14 MG TABS Take 1 tablet (14 mg total) by mouth daily at 6 (six) AM. Through [employer] Patient Assistance 04/04/23  Yes Sowles, Krichna, MD  tretinoin (RETIN-A) 0.025 % cream Apply 1 Application topically daily. 09/20/23  Yes [provider]   Allergies  Allergen Reactions   Pantoprazole  Other (See Comments)    Affects thyroid     Rosuvastatin  Other (See Comments)    Myalgia     FAMILY  HISTORY:  family history includes Breast cancer (age of onset: 84) in her maternal aunt; CVA in her brother; Diabetes in her sister; Heart disease in her father and mother; Hypertension in her brother, sister, and sister; Kidney disease in her sister; Kidney failure in her sister. SOCIAL HISTORY:  reports that she has never smoked. She has never used smokeless tobacco. She reports that she does not drink alcohol and does not use drugs.   Review of Systems:  Gen:  Denies  fever, sweats, chills weight loss  HEENT: Denies blurred vision, double vision, ear pain, eye pain, hearing loss, nose bleeds, sore throat Cardiac:  No dizziness, chest pain or heaviness, chest tightness,edema, No JVD Resp:   No cough, -sputum production, -shortness of breath,-wheezing, -hemoptysis,  Gi: Denies swallowing difficulty, stomach pain, nausea or vomiting, diarrhea, constipation, bowel incontinence Gu:  Denies bladder incontinence, burning urine Ext:   Denies Joint pain, stiffness or swelling Skin: Denies  skin rash, easy bruising or bleeding or hives Endoc:  Denies polyuria, polydipsia , polyphagia or weight change Psych:   Denies depression, insomnia or hallucinations  Other:  All other systems negative  VITAL SIGNS: BP 118/80   Pulse 81   Temp 98.7 F (37.1 C)   Ht 5' 4 (1.626 m)   Wt 215 lb 6.4 oz (97.7 kg)   SpO2 97%   BMI 36.97 kg/m     Physical Examination:   General Appearance: No distress  EYES PERRLA, EOM intact.   NECK Supple, No JVD Pulmonary: normal breath sounds, No wheezing.  CardiovascularNormal S1,S2.  No m/r/g.   Abdomen: Benign, Soft, non-tender. Skin:   warm, no rashes, no ecchymosis  Extremities: normal, no cyanosis, clubbing. Neuro:without focal findings,  speech normal  PSYCHIATRIC: Mood, affect within normal limits.   ASSESSMENT AND PLAN  OSA Reviewed HST results with patient. Starting on APAP therapy set to 4-16 cm H2O.  Discussed the consequences of untreated  sleep apnea. Advised not to drive drowsy for safety of patient and others. Will follow up in 3 months.    HTN Stable, on current management. Following with PCP.   Insomnia Counseled patient on stimulus control and improving sleep hygiene practices. Will also try patient on Trazodone  50 mg nightly.    Patient  satisfied with Plan of action and management. All questions answered  I spent a total of 20 minutes reviewing chart data, face-to-face evaluation with the patient, counseling and coordination of care as detailed above.    Johnathan Tortorelli, M.D.  Sleep Medicine [facility]

## 2024-09-22 NOTE — Patient Instructions (Signed)

## 2024-10-07 ENCOUNTER — Telehealth: Payer: Self-pay

## 2024-10-07 ENCOUNTER — Other Ambulatory Visit: Payer: Self-pay

## 2024-10-07 ENCOUNTER — Encounter: Payer: Self-pay | Admitting: Pharmacist

## 2024-10-07 NOTE — Telephone Encounter (Signed)
 Contacted pharmacist at [facility] to check on prescription status. It appears that medication was transferred, but had not yet been filled. Pharmacy will fill and ship out prescription today.  Contacted patient to provide this update.   Merrillyn Ackerley E. Marsh, PharmD, CPP Clinical Pharmacist [facility] [phone]

## 2024-10-07 NOTE — Telephone Encounter (Signed)
 Per your note: MISC: Contacted [place] pharmacy regarding copay for Aromasin . It appears that it is ~$40 for a 3 month supply. Patient requested to have Rx transferred via [facility] mail order.    I left vm for her to reach out to cancer provider Dr. Babara who prescribes and have them send to the mail order.  Not sure if this was something you were doing?

## 2024-10-07 NOTE — Telephone Encounter (Signed)
 Copied from CRM [id number]. Topic: Clinical - Medication Question >> Oct 07, 2024  9:10 AM Rosaria BRAVO wrote: Reason for CRM: Pt called requesting to speak to the pharmacist regarding her cancer medication. Says she has not received any follow up in weeks. Please advise.   Best contact: [phone]

## 2024-10-12 ENCOUNTER — Other Ambulatory Visit: Payer: Self-pay

## 2024-10-12 ENCOUNTER — Other Ambulatory Visit (HOSPITAL_COMMUNITY): Payer: Self-pay

## 2024-10-16 ENCOUNTER — Other Ambulatory Visit: Payer: Self-pay | Admitting: Sleep Medicine

## 2024-10-21 NOTE — Telephone Encounter (Signed)
 Pt call back,is approved thru [employer](Farxiga ) thru 11/11/2025.

## 2024-10-21 NOTE — Telephone Encounter (Signed)
 Spoke with pt ,she will be calling [employer] to re-enroll for 2026 ,pt will call back if she needs pap mail out.

## 2024-11-01 DIAGNOSIS — G4733 Obstructive sleep apnea (adult) (pediatric): Secondary | ICD-10-CM | POA: Diagnosis not present

## 2024-11-19 ENCOUNTER — Encounter: Payer: Self-pay | Admitting: Family Medicine

## 2024-11-19 ENCOUNTER — Ambulatory Visit: Admitting: Family Medicine

## 2024-11-19 VITALS — BP 118/76 | HR 73 | Resp 16 | Ht 64.0 in | Wt 212.5 lb

## 2024-11-19 DIAGNOSIS — I272 Pulmonary hypertension, unspecified: Secondary | ICD-10-CM

## 2024-11-19 DIAGNOSIS — K219 Gastro-esophageal reflux disease without esophagitis: Secondary | ICD-10-CM | POA: Insufficient documentation

## 2024-11-19 DIAGNOSIS — E119 Type 2 diabetes mellitus without complications: Secondary | ICD-10-CM | POA: Insufficient documentation

## 2024-11-19 DIAGNOSIS — E66811 Obesity, class 1: Secondary | ICD-10-CM

## 2024-11-19 DIAGNOSIS — E785 Hyperlipidemia, unspecified: Secondary | ICD-10-CM

## 2024-11-19 DIAGNOSIS — G720 Drug-induced myopathy: Secondary | ICD-10-CM

## 2024-11-19 DIAGNOSIS — E1169 Type 2 diabetes mellitus with other specified complication: Secondary | ICD-10-CM

## 2024-11-19 DIAGNOSIS — G4733 Obstructive sleep apnea (adult) (pediatric): Secondary | ICD-10-CM

## 2024-11-19 DIAGNOSIS — C50211 Malignant neoplasm of upper-inner quadrant of right female breast: Secondary | ICD-10-CM

## 2024-11-19 DIAGNOSIS — N1831 Chronic kidney disease, stage 3a: Secondary | ICD-10-CM

## 2024-11-19 DIAGNOSIS — E1122 Type 2 diabetes mellitus with diabetic chronic kidney disease: Secondary | ICD-10-CM

## 2024-11-19 DIAGNOSIS — I119 Hypertensive heart disease without heart failure: Secondary | ICD-10-CM

## 2024-11-19 DIAGNOSIS — I10 Essential (primary) hypertension: Secondary | ICD-10-CM

## 2024-11-19 DIAGNOSIS — Z7984 Long term (current) use of oral hypoglycemic drugs: Secondary | ICD-10-CM

## 2024-11-19 LAB — POCT GLYCOSYLATED HEMOGLOBIN (HGB A1C): Hemoglobin A1C: 6.4 % — AB (ref 4.0–5.6)

## 2024-11-19 MED ORDER — FAMOTIDINE 40 MG PO TABS: 40.0000 mg | ORAL_TABLET | Freq: Every evening | ORAL | 0 refills | Status: AC

## 2024-11-19 MED ORDER — DAPAGLIFLOZIN PROPANEDIOL 10 MG PO TABS: 10.0000 mg | ORAL_TABLET | Freq: Every day | ORAL | 3 refills | Status: AC

## 2024-11-19 NOTE — Progress Notes (Signed)
 Name: Andrea Randolph   MRN: [id number]    DOB: 08-29-1956   Date:11/19/2024       Progress Note  Subjective  Chief Complaint  Chief Complaint  Patient presents with   Medical Management of Chronic Issues   Discussed the use of AI scribe software for clinical note transcription with the patient, who gave verbal consent to proceed.  History of Present Illness Andrea Randolph is a 69 year old female with type 2 diabetes, dyslipidemia, and chronic kidney disease who presents for a regular follow-up visit.  She experiences increased heartburn at night despite eating dinner between 5 and 6 PM and going to bed after 11 PM. She uses Tums for relief but is not on any prescription medication for reflux. She recalls similar issues in the past and has taken medications like omeprazole . She prefers to manage her symptoms with Pepcid  before bed.  She has type 2 diabetes and is currently taking Rybelsus  and Farxiga . No excessive hunger, thirst, or frequent urination. She tries to avoid carbs and has reduced chocolate cravings. She is attempting to return to a routine of physical activity at the [facility], aiming for three times a week.  She has chronic kidney disease stage 3A and is taking olmesartan  and Farxiga  for kidney protection. She avoids NSAIDs and drinks plenty of water. No issues with urination or itching related to kidney function.  For dyslipidemia, she takes ezetimibe  and Repatha  injections every 14 days due to statin intolerance. She is compliant with her medication regimen.  She has a history of obstructive sleep apnea and recently started using a new CPAP machine, which she tolerates well and uses every night. It is helpful for her breathing.  She has hypertensive left ventricular hypertrophy and mild pulmonary hypertension. No chest pain or palpitations.  She has a history of breast cancer, estrogen receptor positive, and is on aromatase inhibitors. She takes the medication every other day due to  side effects.  She experiences insomnia but does not regularly take trazodone , preferring to manage her sleep without medication. She occasionally uses it but dislikes taking medications.  She has urge incontinence and finds VESIcare  helpful in reducing frequency. She typically goes to the bathroom before bed and once in the early morning.  Her BMI is over 35, and she has lost three pounds since November. She eats a small breakfast and an early dinner, sometimes skipping dinner if she eats a late lunch. She focuses on protein intake to prevent muscle loss.  She has a goiter and takes levothyroxine  125 mcg daily. Her TSH levels have been stable. No swallowing difficulties.  Her B12 levels were high in September, so she stopped supplementation. She plans to resume taking B12 three times a week.    Patient Active Problem List   Diagnosis Date Noted   Urge incontinence of urine 07/20/2024   Diabetes mellitus with microalbuminuria (HCC) 11/15/2023   Chronic bilateral low back pain with left-sided sciatica 07/11/2023   Genetic testing 03/28/2022   Stage 3a chronic kidney disease (CKD) (HCC) 03/07/2022   Aromatase inhibitor use 05/30/2021   Malignant 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, estrogen receptor positive (HCC) 02/20/2021   Carpal tunnel syndrome, bilateral 02/19/2019   Trigger finger of right thumb 02/19/2019   Flexor tenosynovitis of finger 02/19/2019   Mild concentric left ventricular hypertrophy (LVH) 12/24/2017   Mild aortic stenosis by prior echocardiogram 12/24/2017   Mild aortic regurgitation 12/24/2017   Uterine fibroid 01/11/2016  Allergic rhinitis, seasonal 05/24/2015   Benign essential HTN 05/24/2015   Chronic constipation 05/24/2015   Dyslipidemia 05/24/2015   Post-surgical hypothyroidism 05/24/2015   Mixed incontinence 05/24/2015   Microalbuminuria 05/24/2015   Menopause 05/24/2015   Obstructive apnea 05/24/2015   Morbid obesity (HCC)  05/24/2015   Hypertensive retinopathy 05/24/2015   Vitamin D  deficiency 05/24/2015   Status post total replacement of right shoulder 03/11/2015    Past Surgical History:  Procedure Laterality Date   BREAST BIOPSY Left 90's   benign core needle    BREAST BIOPSY Right 02/07/2021   US  Bx, Q-clip, DCIS   BREAST LUMPECTOMY Right 03/06/2021   BREAST LUMPECTOMY WITH SENTINEL LYMPH NODE BIOPSY Right 03/06/2021   Procedure: BREAST LUMPECTOMY WITH SENTINEL LYMPH NODE BX;  Surgeon: Dessa Reyes ORN, MD;  Location: [facility] ORS;  Service: General;  Laterality: Right;   COLONOSCOPY WITH PROPOFOL  N/A 01/26/2019   Procedure: COLONOSCOPY WITH PROPOFOL ;  Surgeon: Unk Corinn Skiff, MD;  Location: [facility] ENDOSCOPY;  Service: Gastroenterology;  Laterality: N/A;   COLONOSCOPY WITH PROPOFOL  N/A 02/09/2022   Procedure: COLONOSCOPY WITH PROPOFOL ;  Surgeon: Therisa Bi, MD;  Location: [facility] ENDOSCOPY;  Service: Gastroenterology;  Laterality: N/A;   FOOT SURGERY Bilateral    heel spurs   JOINT REPLACEMENT     TOTAL SHOULDER REPLACEMENT Right 01/25/2015   Dr. Edie   TOTAL THYROIDECTOMY  08/01/2018   TRIGGER FINGER RELEASE Right 06/10/2023   ring finger - Dr. Kathlynn    Family History  Problem Relation Age of Onset   Heart disease Mother    Heart disease Father    Kidney disease Sister    Hypertension Sister    Diabetes Sister    Hypertension Sister    Kidney failure Sister    Hypertension Brother    CVA Brother    Breast cancer Maternal Aunt 50   Ovarian cancer Neg Hx    Colon cancer Neg Hx     Social History   Tobacco Use   Smoking status: Never   Smokeless tobacco: Never  Substance Use Topics   Alcohol use: No    Alcohol/week: 0.0 standard drinks of alcohol    Current Medications[1]  Allergies[2]  I personally reviewed active problem list, medication list, allergies, family history with the patient/caregiver today.   ROS  Ten systems reviewed and is negative except as mentioned in  HPI    Objective Physical Exam VITALS: BP- 118/76 MEASUREMENTS: BMI- 35.0. CONSTITUTIONAL: Patient appears well-developed and well-nourished.  No distress. HEENT: Head atraumatic, normocephalic, neck supple. CARDIOVASCULAR: Normal rate, regular rhythm and normal heart sounds.  No murmur heard. No BLE edema. PULMONARY: Effort normal and breath sounds normal. No respiratory distress. ABDOMINAL: There is no tenderness or distention. MUSCULOSKELETAL: Normal gait. Without gross motor or sensory deficit. PSYCHIATRIC: Patient has a normal mood and affect. behavior is normal. Judgment and thought content normal.  Vitals:   11/19/24 0921  BP: 118/76  Pulse: 73  Resp: 16  SpO2: 96%  Weight: 212 lb 8 oz (96.4 kg)  Height: 5' 4 (1.626 m)    Body mass index is 36.48 kg/m.  Recent Results (from the past 2160 hours)  CMP (Cancer Center only)     Status: Abnormal   Collection Time: 09/08/24 10:26 AM  Result Value Ref Range   Sodium 140 135 - 145 mmol/L   Potassium 3.7 3.5 - 5.1 mmol/L   Chloride 103 98 - 111 mmol/L   CO2 27 22 - 32 mmol/L   Glucose,  Bld 114 (H) 70 - 99 mg/dL    Comment: Glucose reference range applies only to samples taken after fasting for at least 8 hours.   BUN 18 8 - 23 mg/dL   Creatinine 8.49 (H) 9.55 - 1.00 mg/dL   Calcium  10.0 8.9 - 10.3 mg/dL   Total Protein 7.6 6.5 - 8.1 g/dL   Albumin 4.1 3.5 - 5.0 g/dL   AST 22 15 - 41 U/L   ALT 18 0 - 44 U/L   Alkaline Phosphatase 58 38 - 126 U/L   Total Bilirubin 0.7 0.0 - 1.2 mg/dL   GFR, Estimated 38 (L) >60 mL/min    Comment: (NOTE) Calculated using the CKD-EPI Creatinine Equation (2021)    Anion gap 10 5 - 15    Comment: Performed at [facility], [address] [place], [place] [address]  CBC with Differential (Cancer Center Only)     Status: Abnormal   Collection Time: 09/08/24 10:27 AM  Result Value Ref Range   WBC Count 3.7 (L) 4.0 - 10.5 K/uL   RBC 5.37 (H) 3.87 - 5.11 MIL/uL   Hemoglobin 14.3  12.0 - 15.0 g/dL   HCT 55.7 63.9 - 53.9 %   MCV 82.3 80.0 - 100.0 fL   MCH 26.6 26.0 - 34.0 pg   MCHC 32.4 30.0 - 36.0 g/dL   RDW 85.6 88.4 - 84.4 %   Platelet Count 342 150 - 400 K/uL   nRBC 0.0 0.0 - 0.2 %   Neutrophils Relative % 47 %   Neutro Abs 1.8 1.7 - 7.7 K/uL   Lymphocytes Relative 42 %   Lymphs Abs 1.6 0.7 - 4.0 K/uL   Monocytes Relative 8 %   Monocytes Absolute 0.3 0.1 - 1.0 K/uL   Eosinophils Relative 2 %   Eosinophils Absolute 0.1 0.0 - 0.5 K/uL   Basophils Relative 1 %   Basophils Absolute 0.0 0.0 - 0.1 K/uL   Immature Granulocytes 0 %   Abs Immature Granulocytes 0.01 0.00 - 0.07 K/uL    Comment: Performed at [facility], [address] [place], [place] [address]    Diabetic Foot Exam:     PHQ2/9:    11/19/2024    9:14 AM 07/20/2024    9:39 AM 05/07/2024    9:00 AM 03/18/2024    1:00 PM 02/21/2024    9:11 AM  Depression screen PHQ 2/9  Decreased Interest 0 0 0 0 0  Down, Depressed, Hopeless 0 0 0 0 0  PHQ - 2 Score 0 0 0 0 0  Altered sleeping    0 1  Tired, decreased energy    0 1  Change in appetite    0 1  Feeling bad or failure about yourself     0 0  Trouble concentrating    0 0  Moving slowly or fidgety/restless    0 0  Suicidal thoughts    0 0  PHQ-9 Score    0  3   Difficult doing work/chores    Not difficult at all Not difficult at all     Data saved with a previous flowsheet row definition    phq 9 is negative  Fall Risk:    11/19/2024    9:14 AM 07/20/2024    9:39 AM 03/18/2024   12:54 PM 02/21/2024    9:09 AM 01/15/2024    8:05 AM  Fall Risk   Falls in the past year? 0 0 0 0 0  Number falls in past yr: 0 0 0 0 0  Injury with Fall? 0 0  0  0  0   Risk for fall due to : No Fall Risks No Fall Risks No Fall Risks No Fall Risks No Fall Risks  Follow up Falls evaluation completed Falls evaluation completed Falls prevention discussed;Education provided;Falls evaluation completed Falls evaluation completed Falls prevention  discussed;Education provided;Falls evaluation completed     Data saved with a previous flowsheet row definition    Assessment & Plan Gastroesophageal reflux disease Increased nocturnal heartburn, possibly due to delayed gastric emptying from Rybelsus . Prefers Pepcid  over omeprazole . - Prescribed famotidine  40 mg in the evening for one week, then every other night if symptoms improve.  Type 2 diabetes mellitus with stage 3a chronic kidney disease and dyslipidemia A1c well controlled at 6.4. Insurance issues with Rybelsus . Discussed potential switch to Trulicity . - Continue Farxiga  and Repatha . - Discuss Rybelsus  coverage with pharmacist and potential switch to Trulicity . - Sent a year's supply of Farxiga  to mail order pharmacy.  Obesity, diabetes, and hypertension syndrome BMI 35. Weight loss of 3 pounds since November. Discussed protein intake to prevent muscle loss on GLP-1 agonist therapy. - Encouraged protein intake with meals.  Hypertension with left ventricular hypertrophy Hypertension well controlled. Blood pressure 118/76. - Continue current antihypertensive regimen including olmesartan , amlodipine , and HCTZ.  Obstructive sleep apnea Compliant with CPAP therapy. Reports improved breathing. - Continue CPAP therapy.  Mild pulmonary hypertension Managed with CPAP therapy. - Continue CPAP therapy.  Malignant neoplasm of upper-inner quadrant of right breast, estrogen receptor positive On aromatase inhibitor therapy, tolerates every other day due to side effects. - Continue aromatase inhibitor therapy as tolerated.  Goiter Well-managed on levothyroxine . Last TSH normal in September. - Continue levothyroxine  125 mcg daily.  Urge incontinence Managed with VESIcare , effective in reducing urination frequency. - Continue VESIcare .  Statin myopathy Unable to tolerate statins. Managed with ezetimibe  and Repatha . - Continue ezetimibe  and Repatha .        [1]  Current  Outpatient Medications:    acetaminophen  (TYLENOL ) 500 MG tablet, Take 500 mg by mouth every 6 (six) hours as needed., Disp: , Rfl:    aspirin 81 MG tablet, Take 81 mg by mouth daily., Disp: , Rfl:    Calcium  Carb-Cholecalciferol (CALCIUM  500+D3 PO), Take 1 tablet by mouth daily., Disp: , Rfl:    Cholecalciferol (VITAMIN D3) 1000 units CAPS, Take 1,000 Units by mouth daily., Disp: , Rfl:    dapagliflozin  propanediol (FARXIGA ) 10 MG TABS tablet, Take 1 tablet (10 mg total) by mouth daily. Through [employer] Patient Assistance, Disp: 90 tablet, Rfl: 3   Evolocumab  (REPATHA  SURECLICK) 140 MG/ML SOAJ, INJECT 140 MG INTO THE SKIN EVERY 14 (FOURTEEN) DAYS., Disp: 6 mL, Rfl: 1   exemestane  (AROMASIN ) 25 MG tablet, Take 1 tablet (25 mg total) by mouth daily., Disp: 90 tablet, Rfl: 1   exemestane  (AROMASIN ) 25 MG tablet, Take 1 tablet (25 mg total) by mouth daily., Disp: 90 tablet, Rfl: 1   ezetimibe  (ZETIA ) 10 MG tablet, Take 1 tablet (10 mg total) by mouth daily., Disp: 90 tablet, Rfl: 1   glucose blood test strip, , Disp: , Rfl:    levothyroxine  (SYNTHROID ) 125 MCG tablet, TAKE 1 TABLET BY MOUTH EVERY DAY, Disp: 90 tablet, Rfl: 1   Olmesartan -amLODIPine -HCTZ 40-5-25 MG TABS, Take 1 tablet by mouth daily with breakfast., Disp: 90 tablet, Rfl: 1   Semaglutide  (RYBELSUS ) 14 MG TABS, Take 1 tablet (14 mg total) by  mouth daily at 6 (six) AM. Through [employer] Patient Assistance, Disp: 90 tablet, Rfl: 0   solifenacin  (VESICARE ) 10 MG tablet, Take 1 tablet (10 mg total) by mouth daily., Disp: 90 tablet, Rfl: 1   traZODone  (DESYREL ) 50 MG tablet, TAKE 1 TABLET BY MOUTH EVERYDAY AT BEDTIME, Disp: 90 tablet, Rfl: 2   cyanocobalamin  (VITAMIN B12) 1000 MCG tablet, Take 1,000 mcg by mouth daily. (Patient not taking: Reported on 11/19/2024), Disp: , Rfl:    exemestane  (AROMASIN ) 25 MG tablet, Take 1 tablet (25 mg total) by mouth daily. (Patient not taking: Reported on 11/19/2024), Disp: 90 tablet, Rfl: 1 [2]   Allergies Allergen Reactions   Pantoprazole  Other (See Comments)    Affects thyroid     Rosuvastatin  Other (See Comments)    Myalgia

## 2024-11-23 ENCOUNTER — Other Ambulatory Visit

## 2024-11-23 DIAGNOSIS — E1122 Type 2 diabetes mellitus with diabetic chronic kidney disease: Secondary | ICD-10-CM

## 2024-11-23 DIAGNOSIS — Z7984 Long term (current) use of oral hypoglycemic drugs: Secondary | ICD-10-CM

## 2024-11-23 DIAGNOSIS — N1831 Type 2 diabetes mellitus with diabetic chronic kidney disease: Secondary | ICD-10-CM

## 2024-11-23 MED ORDER — TRULICITY 3 MG/0.5ML ~~LOC~~ SOAJ: 3.0000 mg | SUBCUTANEOUS | 3 refills | Status: AC

## 2024-11-23 NOTE — Progress Notes (Signed)
 "  S:     Reason for visit: ?  Andrea Randolph is a 69 y.o. female with a history of diabetes (type 2), who presents today for a follow up diabetes Telephone pharmacotherapy visit.? Pertinent PMH also includes HTN, aortic stenosis, breast cancer, HLD, OSA, left ventricular hypertrophy.   Care Team: Primary Care Provider: Sowles, Krichna, MD  Current diabetes medications include: Farxiga  10 mg daily, Rybelsus  14 mg daily  Previous diabetes medications include: Synjardy , Xigduo  Current hypertension medications include: olmesartan /amlodipine /hydrochlorothiazide  40/5/25 mg daily  Current hyperlipidemia medications include: Repatha  140 mg every 14 days, ezetimibe  10 mg daily   Patient reports adherence to taking all medications as prescribed.   Have you been experiencing any side effects to the medications prescribed? Yes - some dry mouth with solifenacin  Do you have any problems obtaining medications due to transportation or finances? Yes, cost of Rybelsus  Insurance coverage: [employer] Medicare  Current medication access support:  Hypercholesteremia healthwell grant through 02/28/2025  Farxiga  via [employer] until 11/11/25  Patient denies hypoglycemic events.  Reported home fasting blood sugars: not checking   Patient reported dietary habits: Eats 2 meals/day Breakfast: banana, yogurt, bagel Dinner: chicken or pork, vegetables, carb (rice or pasta)  Patient-reported exercise habits: workout classes most days of the week  DM Prevention:  Statin: Intolerant to.?  ACE/ARB: yes; olmesartan  History of chronic kidney disease? yes Last urinary albumin/creatinine ratio:  Lab Results  Component Value Date   MICRALBCREAT 10 03/18/2024   MICRALBCREAT normal 01/19/2024   MICRALBCREAT 21 11/26/2022   MICRALBCREAT 15 12/06/2021   MICRALBCREAT 15 06/20/2020   MICRALBCREAT 35 (H) 10/26/2019   MICRALBCREAT 6 01/14/2017   Last eye exam:  Lab Results  Component Value Date   HMDIABEYEEXA No  Retinopathy 04/22/2024   Lab Results  Component Value Date   HMDIABEYEEXA No Retinopathy 04/22/2024   Last foot exam: No foot exam found Tobacco Use:  Tobacco Use: Low Risk (11/19/2024)   Patient History    Smoking Tobacco Use: Never    Smokeless Tobacco Use: Never    Passive Exposure: Not on file   O:   Vitals:  Wt Readings from Last 3 Encounters:  11/19/24 212 lb 8 oz (96.4 kg)  09/22/24 215 lb 6.4 oz (97.7 kg)  09/08/24 212 lb 3.2 oz (96.3 kg)   BP Readings from Last 3 Encounters:  11/19/24 118/76  09/22/24 118/80  09/08/24 (!) 140/75   Pulse Readings from Last 3 Encounters:  11/19/24 73  09/22/24 81  09/08/24 64     Labs:?  Lab Results  Component Value Date   HGBA1C 6.4 (A) 11/19/2024   HGBA1C 6.3 (A) 07/20/2024   HGBA1C 6.5 (A) 03/18/2024   GLUCOSE 114 (H) 09/08/2024   MICRALBCREAT 10 03/18/2024   MICRALBCREAT normal 01/19/2024   MICRALBCREAT 21 11/26/2022   CREATININE 1.50 (H) 09/08/2024   CREATININE 1.22 (H) 07/20/2024   CREATININE 1.33 (H) 03/09/2024    Lab Results  Component Value Date   CHOL 174 07/20/2024   LDLCALC 96 07/20/2024   LDLCALC 85 07/11/2023   LDLCALC 112 (H) 11/26/2022   HDL 62 07/20/2024   TRIG 71 07/20/2024   TRIG 72 07/11/2023   TRIG 91 11/26/2022   ALT 18 09/08/2024   ALT 21 07/20/2024   AST 22 09/08/2024   AST 18 07/20/2024      Chemistry      Component Value Date/Time   NA 140 09/08/2024 1026   NA 140 08/29/2015 1135   K  3.7 09/08/2024 1026   CL 103 09/08/2024 1026   CO2 27 09/08/2024 1026   BUN 18 09/08/2024 1026   BUN 15 08/29/2015 1135   CREATININE 1.50 (H) 09/08/2024 1026   CREATININE 1.22 (H) 07/20/2024 1054      Component Value Date/Time   CALCIUM  10.0 09/08/2024 1026   ALKPHOS 58 09/08/2024 1026   AST 22 09/08/2024 1026   ALT 18 09/08/2024 1026   BILITOT 0.7 09/08/2024 1026       The 10-year ASCVD risk score (Arnett DK, et al., 2019) is: 15.6%  Lab Results  Component Value Date   MICRALBCREAT  10 03/18/2024   MICRALBCREAT normal 01/19/2024   MICRALBCREAT 21 11/26/2022   MICRALBCREAT 15 12/06/2021   MICRALBCREAT 15 06/20/2020   MICRALBCREAT 35 (H) 10/26/2019   MICRALBCREAT 6 01/14/2017    A/P: Diabetes currently controlled with a most recent A1c of 6.4% on 11/19/24. Patient is able to verbalize appropriate hypoglycemia management plan. Medication adherence appears appropriate. Discussed that [employer] will no longer be offering PAP for 2025 for Rybelsus  for Medicare beneficiaries. Discussed switching to an alternate GLP1 agent, Trulicity . Patient is agreeable. Will switch to one dose higher than equivalent dose for additional weight loss benefit. Patient has ~2 months left of Rybelsus  at this time.  -Switched GLP-1 Rybelsus  (semaglutide ) to Trulicity  (dulaglutide ) 3 mg weekly -Continued SGLT2-I Farxiga  (dapagliflozin )10 mg daily.  -Extensively discussed pathophysiology of diabetes, recommended lifestyle interventions, dietary effects on blood sugar control.  -Counseled on s/sx of and management of hypoglycemia.   ASCVD risk - secondary prevention in patient with diabetes. Last LDL is 96 mg/dL, not at goal of <44 mg/dL.  -Continued Repatha  140 mg every 2 weeks -Continued ezetimibe  10 mg daily   Patient verbalized understanding of treatment plan. Total time patient counseling 60 minutes.  Follow-up:  Pharmacist on 12/28/24 PCP clinic visit on 01/20/25  Peyton Andrea Randolph, PharmD, BCACP, CPP Clinical Pharmacist [facility] [phone]   "

## 2024-12-01 ENCOUNTER — Telehealth: Payer: Self-pay

## 2024-12-01 NOTE — Telephone Encounter (Signed)
 Received new pap [name]  patient portion along proof of income, waiting on provider portion.

## 2024-12-02 NOTE — Telephone Encounter (Signed)
 Received provider portion [employer],faxed to [employer] along pt portion,proof of income ,Ins card.

## 2024-12-02 NOTE — Telephone Encounter (Signed)
 Refaxed provider portion to medication assistance team.  Antonyo Hinderer E. Marsh, PharmD, BCACP, CPP Clinical Pharmacist [facility] [phone]

## 2024-12-03 NOTE — Telephone Encounter (Signed)
 Received approval letter from [facility] Trulicity  thru 11/11/2025, approval letter index.

## 2024-12-18 ENCOUNTER — Other Ambulatory Visit: Payer: Self-pay

## 2024-12-23 ENCOUNTER — Ambulatory Visit: Admitting: Sleep Medicine

## 2024-12-28 ENCOUNTER — Other Ambulatory Visit

## 2025-01-20 ENCOUNTER — Encounter: Admitting: Family Medicine

## 2025-01-21 ENCOUNTER — Encounter

## 2025-02-22 ENCOUNTER — Ambulatory Visit: Admitting: Cardiovascular Disease

## 2025-02-26 ENCOUNTER — Ambulatory Visit

## 2025-03-12 ENCOUNTER — Inpatient Hospital Stay: Admitting: Oncology

## 2025-03-12 ENCOUNTER — Inpatient Hospital Stay

## 2025-03-19 ENCOUNTER — Ambulatory Visit: Admitting: Family Medicine
